# Patient Record
Sex: Male | Born: 1960
Health system: Southern US, Community
[De-identification: ages and names within clinical notes are randomized; demographics above are authoritative.]

## PROBLEM LIST (undated history)

## (undated) DIAGNOSIS — Z8679 Personal history of other diseases of the circulatory system: Secondary | ICD-10-CM

## (undated) DIAGNOSIS — I2510 Atherosclerotic heart disease of native coronary artery without angina pectoris: Secondary | ICD-10-CM

## (undated) DIAGNOSIS — I509 Heart failure, unspecified: Secondary | ICD-10-CM

## (undated) DIAGNOSIS — R000 Tachycardia, unspecified: Secondary | ICD-10-CM

## (undated) HISTORY — DX: Tachycardia, unspecified: R00.0

## (undated) HISTORY — DX: Personal history of other diseases of the circulatory system: Z86.79

## (undated) HISTORY — DX: Heart failure, unspecified: I50.9

## (undated) HISTORY — DX: Atherosclerotic heart disease of native coronary artery without angina pectoris: I25.10

---

## 2004-01-27 ENCOUNTER — Observation Stay (HOSPITAL_COMMUNITY): Admission: EM | Admit: 2004-01-27 | Discharge: 2004-01-29 | Payer: Self-pay | Admitting: Emergency Medicine

## 2004-01-27 HISTORY — PX: INGUINAL HERNIA REPAIR: SHX194

## 2008-09-27 DIAGNOSIS — R000 Tachycardia, unspecified: Secondary | ICD-10-CM

## 2008-09-27 HISTORY — DX: Tachycardia, unspecified: R00.0

## 2009-06-24 ENCOUNTER — Encounter: Payer: Self-pay | Admitting: Internal Medicine

## 2009-06-24 ENCOUNTER — Ambulatory Visit: Payer: Self-pay | Admitting: Cardiology

## 2009-06-24 ENCOUNTER — Ambulatory Visit: Payer: Self-pay | Admitting: Diagnostic Radiology

## 2009-06-24 ENCOUNTER — Encounter: Payer: Self-pay | Admitting: Emergency Medicine

## 2009-06-24 ENCOUNTER — Inpatient Hospital Stay (HOSPITAL_COMMUNITY): Admission: EM | Admit: 2009-06-24 | Discharge: 2009-06-28 | Payer: Self-pay | Admitting: Internal Medicine

## 2009-06-27 HISTORY — PX: CARDIAC CATHETERIZATION: SHX172

## 2009-06-30 ENCOUNTER — Telehealth: Payer: Self-pay | Admitting: Internal Medicine

## 2009-07-01 ENCOUNTER — Telehealth (INDEPENDENT_AMBULATORY_CARE_PROVIDER_SITE_OTHER): Payer: Self-pay | Admitting: *Deleted

## 2009-07-07 DIAGNOSIS — I2510 Atherosclerotic heart disease of native coronary artery without angina pectoris: Secondary | ICD-10-CM | POA: Insufficient documentation

## 2009-07-07 DIAGNOSIS — I10 Essential (primary) hypertension: Secondary | ICD-10-CM | POA: Insufficient documentation

## 2009-07-07 DIAGNOSIS — R000 Tachycardia, unspecified: Secondary | ICD-10-CM | POA: Insufficient documentation

## 2009-07-07 DIAGNOSIS — I509 Heart failure, unspecified: Secondary | ICD-10-CM | POA: Insufficient documentation

## 2009-07-08 ENCOUNTER — Ambulatory Visit: Payer: Self-pay | Admitting: Internal Medicine

## 2009-07-09 ENCOUNTER — Telehealth: Payer: Self-pay | Admitting: Internal Medicine

## 2009-07-14 ENCOUNTER — Telehealth: Payer: Self-pay | Admitting: Internal Medicine

## 2009-07-18 ENCOUNTER — Telehealth: Payer: Self-pay | Admitting: Internal Medicine

## 2009-07-22 ENCOUNTER — Ambulatory Visit: Payer: Self-pay | Admitting: Internal Medicine

## 2009-07-22 DIAGNOSIS — I5022 Chronic systolic (congestive) heart failure: Secondary | ICD-10-CM | POA: Insufficient documentation

## 2009-07-22 DIAGNOSIS — I2510 Atherosclerotic heart disease of native coronary artery without angina pectoris: Secondary | ICD-10-CM | POA: Insufficient documentation

## 2009-07-28 ENCOUNTER — Telehealth: Payer: Self-pay | Admitting: Internal Medicine

## 2009-08-05 ENCOUNTER — Ambulatory Visit: Payer: Self-pay | Admitting: Cardiology

## 2009-08-06 ENCOUNTER — Telehealth: Payer: Self-pay | Admitting: Internal Medicine

## 2009-08-19 ENCOUNTER — Ambulatory Visit: Payer: Self-pay | Admitting: Cardiology

## 2009-08-19 ENCOUNTER — Encounter (INDEPENDENT_AMBULATORY_CARE_PROVIDER_SITE_OTHER): Payer: Self-pay | Admitting: *Deleted

## 2009-08-19 ENCOUNTER — Encounter: Payer: Self-pay | Admitting: Cardiology

## 2009-08-19 ENCOUNTER — Ambulatory Visit (HOSPITAL_COMMUNITY): Admission: RE | Admit: 2009-08-19 | Discharge: 2009-08-19 | Payer: Self-pay | Admitting: Cardiology

## 2009-08-19 ENCOUNTER — Ambulatory Visit: Payer: Self-pay | Admitting: Cardiovascular Disease

## 2009-08-19 ENCOUNTER — Ambulatory Visit: Payer: Self-pay

## 2009-08-26 ENCOUNTER — Telehealth: Payer: Self-pay | Admitting: Internal Medicine

## 2009-09-01 ENCOUNTER — Telehealth: Payer: Self-pay | Admitting: Internal Medicine

## 2009-09-03 ENCOUNTER — Telehealth: Payer: Self-pay | Admitting: Internal Medicine

## 2009-09-30 ENCOUNTER — Encounter (INDEPENDENT_AMBULATORY_CARE_PROVIDER_SITE_OTHER): Payer: Self-pay | Admitting: Nurse Practitioner

## 2009-09-30 ENCOUNTER — Ambulatory Visit: Payer: Self-pay | Admitting: Cardiology

## 2009-09-30 ENCOUNTER — Encounter: Payer: Self-pay | Admitting: Internal Medicine

## 2009-10-01 ENCOUNTER — Telehealth: Payer: Self-pay | Admitting: Internal Medicine

## 2009-10-01 ENCOUNTER — Encounter (INDEPENDENT_AMBULATORY_CARE_PROVIDER_SITE_OTHER): Payer: Self-pay | Admitting: *Deleted

## 2009-10-02 ENCOUNTER — Telehealth: Payer: Self-pay | Admitting: Internal Medicine

## 2009-10-03 ENCOUNTER — Encounter: Payer: Self-pay | Admitting: Internal Medicine

## 2009-10-14 ENCOUNTER — Encounter (INDEPENDENT_AMBULATORY_CARE_PROVIDER_SITE_OTHER): Payer: Self-pay | Admitting: *Deleted

## 2009-10-21 ENCOUNTER — Ambulatory Visit: Payer: Self-pay | Admitting: Cardiology

## 2009-10-21 ENCOUNTER — Ambulatory Visit (HOSPITAL_COMMUNITY): Admission: RE | Admit: 2009-10-21 | Discharge: 2009-10-21 | Payer: Self-pay | Admitting: Internal Medicine

## 2009-10-24 ENCOUNTER — Telehealth: Payer: Self-pay | Admitting: Internal Medicine

## 2009-10-27 ENCOUNTER — Encounter (INDEPENDENT_AMBULATORY_CARE_PROVIDER_SITE_OTHER): Payer: Self-pay | Admitting: *Deleted

## 2009-10-28 ENCOUNTER — Telehealth: Payer: Self-pay | Admitting: Internal Medicine

## 2009-11-18 ENCOUNTER — Ambulatory Visit: Payer: Self-pay | Admitting: Internal Medicine

## 2009-12-01 ENCOUNTER — Encounter: Payer: Self-pay | Admitting: Internal Medicine

## 2009-12-11 ENCOUNTER — Telehealth: Payer: Self-pay | Admitting: Internal Medicine

## 2009-12-11 ENCOUNTER — Encounter: Payer: Self-pay | Admitting: Internal Medicine

## 2009-12-15 ENCOUNTER — Telehealth: Payer: Self-pay | Admitting: Internal Medicine

## 2009-12-24 ENCOUNTER — Ambulatory Visit (HOSPITAL_BASED_OUTPATIENT_CLINIC_OR_DEPARTMENT_OTHER): Admission: RE | Admit: 2009-12-24 | Discharge: 2009-12-24 | Payer: Self-pay | Admitting: Orthopedic Surgery

## 2009-12-24 ENCOUNTER — Ambulatory Visit: Payer: Self-pay | Admitting: Radiology

## 2010-01-01 ENCOUNTER — Encounter: Payer: Self-pay | Admitting: Internal Medicine

## 2010-01-19 ENCOUNTER — Ambulatory Visit: Payer: Self-pay | Admitting: Cardiology

## 2010-01-19 ENCOUNTER — Telehealth: Payer: Self-pay | Admitting: Internal Medicine

## 2010-01-19 ENCOUNTER — Ambulatory Visit: Payer: Self-pay

## 2010-01-19 ENCOUNTER — Encounter: Admission: RE | Admit: 2010-01-19 | Discharge: 2010-01-19 | Payer: Self-pay | Admitting: Neurosurgery

## 2010-01-19 ENCOUNTER — Ambulatory Visit (HOSPITAL_COMMUNITY): Admission: RE | Admit: 2010-01-19 | Discharge: 2010-01-19 | Payer: Self-pay | Admitting: Internal Medicine

## 2010-01-19 ENCOUNTER — Encounter: Payer: Self-pay | Admitting: Internal Medicine

## 2010-02-03 ENCOUNTER — Ambulatory Visit: Payer: Self-pay | Admitting: Internal Medicine

## 2010-02-04 ENCOUNTER — Telehealth: Payer: Self-pay | Admitting: Internal Medicine

## 2010-02-06 ENCOUNTER — Telehealth: Payer: Self-pay | Admitting: Internal Medicine

## 2010-02-12 ENCOUNTER — Telehealth: Payer: Self-pay | Admitting: Internal Medicine

## 2010-03-16 ENCOUNTER — Telehealth: Payer: Self-pay | Admitting: Internal Medicine

## 2010-03-25 ENCOUNTER — Telehealth: Payer: Self-pay | Admitting: Internal Medicine

## 2010-03-27 ENCOUNTER — Telehealth: Payer: Self-pay | Admitting: Internal Medicine

## 2010-04-06 ENCOUNTER — Telehealth: Payer: Self-pay | Admitting: Internal Medicine

## 2010-04-10 ENCOUNTER — Ambulatory Visit: Payer: Self-pay | Admitting: Internal Medicine

## 2010-04-13 ENCOUNTER — Encounter: Payer: Self-pay | Admitting: Internal Medicine

## 2010-04-13 LAB — CONVERTED CEMR LAB
ALT: 23 units/L (ref 0–53)
AST: 26 units/L (ref 0–37)
Albumin: 4.2 g/dL (ref 3.5–5.2)
Alkaline Phosphatase: 32 units/L — ABNORMAL LOW (ref 39–117)
BUN: 17 mg/dL (ref 6–23)
Bilirubin, Direct: 0.1 mg/dL (ref 0.0–0.3)
CO2: 29 meq/L (ref 19–32)
Calcium: 9.1 mg/dL (ref 8.4–10.5)
Chloride: 110 meq/L (ref 96–112)
Cholesterol: 173 mg/dL (ref 0–200)
Creatinine, Ser: 1.1 mg/dL (ref 0.4–1.5)
GFR calc non Af Amer: 90.06 mL/min (ref >60)
Glucose, Bld: 95 mg/dL (ref 70–99)
HDL: 41.6 mg/dL (ref >39.00)
LDL Cholesterol: 112 mg/dL — ABNORMAL HIGH (ref 0–99)
Potassium: 3.9 meq/L (ref 3.5–5.1)
Pro B Natriuretic peptide (BNP): 25.7 pg/mL (ref 0.0–100.0)
Sodium: 141 meq/L (ref 135–145)
Total Bilirubin: 0.6 mg/dL (ref 0.3–1.2)
Total CHOL/HDL Ratio: 4
Total Protein: 6.4 g/dL (ref 6.0–8.3)
Triglycerides: 99 mg/dL (ref 0.0–149.0)
VLDL: 19.8 mg/dL (ref 0.0–40.0)

## 2010-05-18 ENCOUNTER — Encounter: Payer: Self-pay | Admitting: Internal Medicine

## 2010-05-27 ENCOUNTER — Ambulatory Visit: Payer: Self-pay | Admitting: Internal Medicine

## 2010-05-28 ENCOUNTER — Telehealth: Payer: Self-pay | Admitting: Internal Medicine

## 2010-06-04 ENCOUNTER — Encounter: Payer: Self-pay | Admitting: Internal Medicine

## 2010-06-10 ENCOUNTER — Ambulatory Visit: Payer: Self-pay | Admitting: Internal Medicine

## 2010-06-17 LAB — CONVERTED CEMR LAB
BUN: 24 mg/dL — ABNORMAL HIGH (ref 6–23)
CO2: 28 meq/L (ref 19–32)
Calcium: 9.3 mg/dL (ref 8.4–10.5)
Chloride: 107 meq/L (ref 96–112)
Creatinine, Ser: 1.2 mg/dL (ref 0.4–1.5)
GFR calc non Af Amer: 87.29 mL/min (ref >60)
Glucose, Bld: 91 mg/dL (ref 70–99)
Potassium: 3.6 meq/L (ref 3.5–5.1)
Pro B Natriuretic peptide (BNP): 23.2 pg/mL (ref 0.0–100.0)
Sodium: 142 meq/L (ref 135–145)

## 2010-07-05 ENCOUNTER — Encounter: Payer: Self-pay | Admitting: Internal Medicine

## 2010-07-17 ENCOUNTER — Telehealth: Payer: Self-pay | Admitting: Internal Medicine

## 2010-08-05 ENCOUNTER — Encounter: Payer: Self-pay | Admitting: Internal Medicine

## 2010-09-05 ENCOUNTER — Encounter: Payer: Self-pay | Admitting: Internal Medicine

## 2010-10-18 ENCOUNTER — Encounter: Payer: Self-pay | Admitting: Neurosurgery

## 2010-10-25 LAB — CONVERTED CEMR LAB
BUN: 21 mg/dL (ref 6–23)
CO2: 30 meq/L (ref 19–32)
Calcium: 9.8 mg/dL (ref 8.4–10.5)
Chloride: 101 meq/L (ref 96–112)
Creatinine, Ser: 1.3 mg/dL (ref 0.4–1.5)
Digitoxin Lvl: <0.1 ng/mL — ABNORMAL LOW (ref 0.8–2.0)
GFR calc non Af Amer: 76.08 mL/min (ref >60)
Glucose, Bld: 77 mg/dL (ref 70–99)
Potassium: 4.3 meq/L (ref 3.5–5.1)
Pro B Natriuretic peptide (BNP): 485 pg/mL — ABNORMAL HIGH (ref 0.0–100.0)
Sodium: 144 meq/L (ref 135–145)
TSH: 3.25 microintl units/mL (ref 0.35–5.50)

## 2010-10-27 ENCOUNTER — Ambulatory Visit (HOSPITAL_COMMUNITY): Admission: RE | Admit: 2010-10-27 | Payer: Self-pay | Source: Home / Self Care | Admitting: Internal Medicine

## 2010-10-27 ENCOUNTER — Ambulatory Visit: Admit: 2010-10-27 | Payer: Self-pay

## 2010-10-27 NOTE — Progress Notes (Signed)
Summary: pt needs updated work resriction note  Phone Note Call from Patient   Caller: Patient Reason for Call: Talk to Nurse, Talk to Doctor Summary of Call: per pt call please contact Janey Genta at [phone] because he needs a updated letter explaining his restrictions again fax# [phone] Initial call taken by: Omer Jack,  December 11, 2009 1:20 PM  Follow-up for Phone Call        Left message to call back Meredith Staggers, RN  December 11, 2009 2:08 PM   spoke w/Gretchen, pt has been out of work for another reason and is returning to work on 3/18 and she just needs a letter stating what his restrictions are from our standpoint and when they will be readdressed, will fax note Meredith Staggers, RN  December 11, 2009 2:25 PM   Additional Follow-up for Phone Call Additional follow up Details #1::        left message for pt that I had spoken w/Gretchen and note was faxed Meredith Staggers, RN  December 11, 2009 2:33 PM

## 2010-10-27 NOTE — Progress Notes (Signed)
Summary: work--needs to discuss job restrictions  Phone Note Call from Patient Call back at [employer] [phone]   Caller: Patient Reason for Call: Talk to Nurse Summary of Call: request to speak to nurse asap about  job Initial call taken by: Migdalia Dk,  Feb 06, 2010 4:08 PM  Follow-up for Phone Call        talked with patient --patient needs to discuss job and job restrictions--per pt-his employer is having a hard time finding a job for him to do with the current work restrictions--will forward to [name] for review     Appended Document: work--needs to discuss job restrictions we will call him on wednesday to discuss

## 2010-10-27 NOTE — Progress Notes (Signed)
Summary: refill rx lisinopril  Phone Note Refill Request Message from:  Patient on March 16, 2010 12:47 PM  Refills Requested: Medication #1:  LISINOPRIL 20 MG TABS 1 by mouth two times a day would like faxes to [facility]   Method Requested: Fax to Local Pharmacy Initial call taken by: Glynda Jaeger,  March 16, 2010 12:47 PM    Prescriptions: LISINOPRIL 20 MG TABS (LISINOPRIL) 1 by mouth two times a day  #180 x 3   Entered by:   Hardin Negus, RMA   Authorized by:   [name], MD, [facility]   Signed by:   Hardin Negus, RMA on 03/16/2010   Method used:   Faxed to ...       [facility] (mail-order)             , [place]         Ph: [phone]       Fax: [phone]   RxID:   [id number]

## 2010-10-27 NOTE — Letter (Signed)
Summary: Return To Work  [facility], Main Office  [address]   [place], [place] [address]   Phone: [phone]  Fax: [phone]    11/18/2009  TO: WHOM IT MAY CONCERN   RE: Zachary Arias [address] [id number]   The above named individual is under my medical care and is to remain out of work until Monday 11/24/09.  If you have any further questions or need additional information, please call.     Sincerely,    Arvilla Meres, MD  Appended Document: Return To Work letter faxed to Christiana Pellant at [phone]

## 2010-10-27 NOTE — Progress Notes (Signed)
Summary: pt needs a 7day supply called into local pharm  Phone Note Refill Request Call back at Home Phone [phone] Message from:  Patient on [facility]/ [phone]  Refills Requested: Medication #1:  LISINOPRIL 20 MG TABS 1 by mouth two times a day pt still has not recieved his meds from [facility] so they have approved him for a 7day supply   Initial call taken by: Omer Jack,  March 25, 2010 1:25 PM    Prescriptions: LISINOPRIL 20 MG TABS (LISINOPRIL) 1 by mouth two times a day  #14 x 0   Entered by:   Burnett Kanaris, CNA   Authorized by:   [name], MD, [facility]   Signed by:   Burnett Kanaris, CNA on 03/25/2010   Method used:   Electronically to        [facility]* (retail)       [address]       [place], [place]  [address]       Ph: [phone]       Fax: [phone]   RxID:   [id number]

## 2010-10-27 NOTE — Progress Notes (Signed)
Summary: HAVE QUESTIONS REGARDING LETTER TO RTN TO WORK / medication  Phone Note Call from Patient Call back at [phone]   Caller: GRETCHEN GREEN FROM HIS WORK Reason for Call: Talk to Nurse, Talk to Doctor Summary of Call: THEY GOT THE LETTER BUT THEY HAVE SOME QUESTIONS ABOUT IT Initial call taken by: Omer Jack,  October 28, 2009 1:17 PM  Follow-up for Phone Call        Left message to call back Meredith Staggers, RN  October 28, 2009 1:42 PM   Per pt calling back to speak with dr. Gala Romney nurse Herbert Seta, has a couple of question , pt is aware heather is in clinic today. pt stated that he is release to go back to work on [employer]  and working around Systems analyst.  also pt stated that his medicaton makes him sleepy. [phone] Lorne Skeens  October 31, 2009 12:34 PM   Additional Follow-up for Phone Call Additional follow up Details #1::        spoke w/pt regarding meds Meredith Staggers, RN  October 31, 2009 4:59 PM

## 2010-10-27 NOTE — Cardiovascular Report (Signed)
Summary: Health Management Program Summary Report   Health Management Program Summary Report   Imported By: Roderic Ovens 09/04/2010 13:06:34  _____________________________________________________________________  External Attachment:    Type:   Image     Comment:   External Document

## 2010-10-27 NOTE — Progress Notes (Signed)
Summary: needs update on his condition faxed to his employer  Phone Note Call from Patient   Reason for Call: Talk to Nurse Summary of Call: pt needs a fax to his employer gretchen green fax [phone] with an update on his condiition as well as stating we changed his appt from 7-26 to 8-31 Initial call taken by: Glynda Jaeger,  April 06, 2010 1:29 PM  Follow-up for Phone Call        talked with pt--pt states employer asking for any changes in his job restrictions since last information provided to employer 02/03/10 and pt requesting that we notify employer  that pt's appointment with [name] has  been changed from July 26,2011 to August 31,2011--I will append letter of 02/03/10 stating that pt's appointment with [name] was changed from July 26 to August 31 and that there have not been any changes in his work restrictions since May 10,2011-I will fax this to Christiana Pellant [phone] at pt's request

## 2010-10-27 NOTE — Cardiovascular Report (Signed)
Summary: Heart Failure Program Summary Report   Heart Failure Program Summary Report   Imported By: Roderic Ovens 05/18/2010 10:05:16  _____________________________________________________________________  External Attachment:    Type:   Image     Comment:   External Document

## 2010-10-27 NOTE — Assessment & Plan Note (Signed)
Summary: per check out/sf   Referring Provider:  Bensimhon Primary Provider:  (none)  CC:  SOB  still mainly when he leaves the house -- pain in his right leg.  History of Present Illness: 50 y/o male with HF due to mixed ischemic/no-ischemic CM with EF 25-30% range. Cath with 1-v CAD with chronically occluded RCA. MRI in January with EF 31% with inferior scar.  Returns today for routine f/u. From cardiac point of view doing OK. Does get SOB with moderate exertion. Occasional fletting CP. No orthopnea or PND. Occasional brief palpitations. No dizziness or syncope.   Main complaint is sciatic pain down R leg which limits him. Went to [facility] last week and given muscle relaxer, steroids, and tramadol.    Current Medications (verified): 1)  Coreg 12.5 Mg Tabs (Carvedilol) .Marland Kitchen.. 1 and 1/2  By Mouth Two Times A Day 2)  Digoxin 0.25 Mg Tabs (Digoxin) .... Take One Tablet By Mouth Daily 3)  Furosemide 20 Mg Tabs (Furosemide) .Marland Kitchen.. 1 By Mouth Daily 4)  Lisinopril 20 Mg Tabs (Lisinopril) .Marland Kitchen.. 1 By Mouth Two Times A Day 5)  K-Lor 20 Meq Pack (Potassium Chloride) .... Daily 6)  Multivitamins   Tabs (Multiple Vitamin) .... Every Other Day 7)  Vitamin E 600 Unit  Caps (Vitamin E) .... Every Other Day 8)  Prednisone 10 Mg Tabs (Prednisone) .... Taper Dose 9)  Tramadol Hcl 50 Mg Tabs (Tramadol Hcl) .... At Bedtime As Needed 10)  Cyclobenzaprine Hcl 10 Mg Tabs (Cyclobenzaprine Hcl) .... At Bedtime As Needed  Allergies (verified): No Known Drug Allergies  Past History:  Past Medical History:  1. Congestive heart failure secondary to mixed ischemic/nonischemia cardiomyopathy with       an ejection fraction of 25-30%.       --Cardiac MRI 1/11: EF 31% Inferior scar      --cath 2010: 1-v CAD with occluded distal RCA with collats. left system ok 2. Hypertension  Review of Systems       As per HPI and past medical history; otherwise all systems negative.   Vital Signs:  Patient  profile:   50 year old male Height:      68 inches Weight:      184 pounds BMI:     28.08 Pulse rate:   70 / minute BP sitting:   140 / 96  (left arm) Cuff size:   regular  Vitals Entered By: Hardin Negus, RMA (November 18, 2009 4:25 PM)  Physical Exam  General:  Gen: well appearing. no resp difficulty HEENT: normal Neck: supple. no JVD. Carotids 2+ bilat; no bruits. No lymphadenopathy or thryomegaly appreciated. Cor: PMI nondisplaced. Regular rate & rhythm. No rubs,  murmur. Lungs: clear Abdomen: soft, nontender, nondistended. No hepatosplenomegaly. No bruits or masses. Good bowel sounds. Extremities: no cyanosis, clubbing, rash, edema Neuro: alert & orientedx3, cranial nerves grossly intact. moves all 4 extremities w/o difficulty. affect pleasant    Impression & Recommendations:  Problem # 1:  SYSTOLIC HEART FAILURE, CHRONIC (ICD-428.22)  Doing well. Volume status looks good. NYHA Class II. Will increase carvedilol to 25 two times a day. Recheck echo in 2-3 months. If EF remains depressed will need to consider ICD. Add spiro at next visit.   Orders: Echocardiogram (Echo)  Problem # 2:  CAD, NATIVE VESSEL (ICD-414.01) Stable. No evidence of ischemia. Continue current regimen.  Problem # 3:  Sciatica Continue current regimen. No evidence of signifcant weakness. Is establishing PCP care on Friday.  Will give him note to stay out of work until then.    Patient Instructions: 1)  Increase Carvedilol to 25mg  two times a day  2)  Your physician recommended you to remain out of work until [facility] 2/28.  An out of work note was provided today. 3)  Your physician has requested that you have an echocardiogram.  Echocardiography is a painless test that uses sound waves to create images of your heart. It provides your doctor with information about the size and shape of your heart and how well your heart's chambers and valves are working.  This procedure takes approximately one hour. There  are no restrictions for this procedure.  Needs test in 2-3 months. 4)  Follow up in 1 month Prescriptions: CARVEDILOL 25 MG TABS (CARVEDILOL) Take one tablet by mouth twice a day  #180 x 3   Entered by:   Meredith Staggers, RN   Authorized by:   [name], MD, [facility]   Signed by:   Meredith Staggers, RN on 11/18/2009   Method used:   Electronically to        [facility] (mail-order)             ,          Ph: [phone]       Fax: [phone]   RxID:   [id number]

## 2010-10-27 NOTE — Assessment & Plan Note (Signed)
Summary: per check out/sf   Visit Type:  Follow-up Referring Provider:  Bensimhon Primary Provider:  Dr Luz Brazen  CC:  rov/ pt wants to discuss if his work restrictions can now be lifted and inquire about getting a pneumonia shot and flu shot.  History of Present Illness: 50 y/o male with HF due to mixed ischemic/no-ischemic CM (diagnosed 9/10) with EF 25-30% range. Cath with 1-v CAD with chronically occluded RCA. MRI in January 2011 with EF 31% with inferior scar.  Echo April 2011 showed EF 30-35%.  We discussed possibility of ICD in May and due to insurance reasons he wanted to wait until 2012.   Still working full time. Feels well. Works as a Administrator, [employer]. About 50% of items less than 35 pounds. Other 50% is 50 pounds or more. He has been following work restrictions but feels he might be able to do more. No CP or SOB. No CP/SOB, edema, or palpitations. Feels like he can do anything he wants to without limitation.   Current Medications (verified): 1)  Carvedilol 25 Mg Tabs (Carvedilol) .... Take One Tablet By Mouth Twice A Day 2)  Digoxin 0.25 Mg Tabs (Digoxin) .... Take One Tablet By Mouth Daily 3)  Furosemide 20 Mg Tabs (Furosemide) .Marland Kitchen.. 1 By Mouth Daily 4)  Lisinopril 20 Mg Tabs (Lisinopril) .Marland Kitchen.. 1 By Mouth Two Times A Day 5)  K-Lor 20 Meq Pack (Potassium Chloride) .... Daily 6)  Multivitamins   Tabs (Multiple Vitamin) .... Every Other Day 7)  Vitamin E 600 Unit  Caps (Vitamin E) .... Every Other Day 8)  Simvastatin 20 Mg Tabs (Simvastatin) .... Take One Tablet By Mouth Daily At Bedtime  Allergies (verified): 1)  ! * Shellfish   Past History:  Past Medical History: Last updated: 11/18/2009  1. Congestive heart failure secondary to mixed ischemic/nonischemia cardiomyopathy with       an ejection fraction of 25-30%.       --Cardiac MRI 1/11: EF 31% Inferior scar      --cath 2010: 1-v CAD with occluded distal RCA with collats. left system ok 2. Hypertension  Social  History:  He is single.  He works as a Location manager.  He is   previously from the [place].  He does not smoke or use any   alcohol.  No reported drug use.   Review of Systems       As per HPI and past medical history; otherwise all systems negative.   Vital Signs:  Patient profile:   50 year old male Height:      68 inches Weight:      173 pounds BMI:     26.40 Pulse rate:   61 / minute Pulse rhythm:   regular BP sitting:   128 / 80  (left arm) Cuff size:   regular  Vitals Entered By: Judithe Modest CMA (May 27, 2010 12:00 PM)  Physical Exam  General:  Gen: well appearing. no resp difficulty HEENT: normal Neck: supple. no JVD. Carotids 2+ bilat; no bruits. No lymphadenopathy or thryomegaly appreciated. Cor: PMI nondisplaced. Regular rate & rhythm. No rubs,  murmur. Lungs: clear Abdomen: soft, nontender, nondistended. No hepatosplenomegaly. No bruits or masses. Good bowel sounds. Extremities: no cyanosis, clubbing, rash, edema Neuro: alert & orientedx3, cranial nerves grossly intact. moves all 4 extremities w/o difficulty. affect pleasant    Impression & Recommendations:  Problem # 1:  SYSTOLIC HEART FAILURE, CHRONIC (ICD-428.22) Doing well. NYHA class I. Volume status looks good. On  good meds. Will add spiro 12.5. Check BMET and BNP in 1 week. 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 Discussed ICD but given NYHA class I without known h/o NSVT doesn't qualify at this point.  F/u 4 months with echo.   Problem # 2:  CAD, NATIVE VESSEL (ICD-414.01) Stable. No evidence of ischemia. Continue current regimen.  Other Orders: Echocardiogram (Echo)  Patient Instructions: 1)  Start Spironolactone 25mg  1/2 tab daily 2)  Your physician recommends that you return for lab work in: 10 days (bmet, bnp 428.22) 3)  Your physician has requested that you have an echocardiogram.  Echocardiography is a painless test that uses sound waves to create images of your  heart. It provides your doctor with information about the size and shape of your heart and how well your heart's chambers and valves are working.  This procedure takes approximately one hour. There are no restrictions for this procedure.  NEEDS IN 4 MONTHS. 4)  Follow up in 4 months. Prescriptions: SPIRONOLACTONE 25 MG TABS (SPIRONOLACTONE) Take 1/2 tablet by mouth daily  #90 x 3   Entered by:   Meredith Staggers, RN   Authorized by:   [name], MD, [facility]   Signed by:   Meredith Staggers, RN on 05/27/2010   Method used:   Faxed to ...       [facility] (mail-order)             , [place]         Ph: [phone]       Fax: [phone]   RxID:   [id number]

## 2010-10-27 NOTE — Progress Notes (Signed)
Summary: has information been faxed to employer  Phone Note Call from Patient Call back at Home Phone [phone]   Caller: Patient Reason for Call: Talk to Nurse Summary of Call: Patient would like to know if updated information was faxed to his employer (regarding return to work date). Initial call taken by: Burnard Leigh,  October 01, 2009 12:10 PM  Follow-up for Phone Call        faxed ov note from 1/4 to Dorna Mai at [place], fax # [phone], pt aware Meredith Staggers, RN  October 01, 2009 12:19 PM

## 2010-10-27 NOTE — Letter (Signed)
Summary: Custom - Lipid  [facility], Main Office  [address]   [place], [place] [address]   Phone: [phone]  Fax: [phone]     April 13, 2010 MRN: [id number]   Zachary Arias [address] [place], [place]  [address]   Dear Mr. Makris,  We have reviewed your cholesterol results.  They are as follows:     Total Cholesterol:    173 (Desirable: less than 200)       HDL  Cholesterol:     41.60  (Desirable: greater than 40 for men and 50 for women)       LDL Cholesterol:       112  (Desirable: less than 100 for low risk and less than 70 for moderate to high risk)       Triglycerides:       99.0  (Desirable: less than 150)  Our recommendations include:  Looks ok, continue current medications, your other labwork including liver and kidney functions were also normal.  Please give [place] a call if you have any questions.   Call our office at the number listed above if you have any questions.  Lowering your LDL cholesterol is important, but it is only one of a large number of "risk factors" that may indicate that you are at risk for heart disease, stroke or other complications of hardening of the arteries.  Other risk factors include:   A.  Cigarette Smoking* B.  High Blood Pressure* C.  Obesity* D.   Low HDL Cholesterol (see yours above)* E.   Diabetes Mellitus (higher risk if your is uncontrolled) F.  Family history of premature heart disease G.  Previous history of stroke or cardiovascular disease    *These are risk factors YOU HAVE CONTROL OVER.  For more information, visit .  There is now evidence that lowering the TOTAL CHOLESTEROL AND LDL CHOLESTEROL can reduce the risk of heart disease.  The American Heart Association recommends the following guidelines for the treatment of elevated cholesterol:  1.  If there is now current heart disease and less than two risk factors, TOTAL CHOLESTEROL should be less than 200 and LDL CHOLESTEROL should be less than  100. 2.  If there is current heart disease or two or more risk factors, TOTAL CHOLESTEROL should be less than 200 and LDL CHOLESTEROL should be less than 70.  A diet low in cholesterol, saturated fat, and calories is the cornerstone of treatment for elevated cholesterol.  Cessation of smoking and exercise are also important in the management of elevated cholesterol and preventing vascular disease.  Studies have shown that 30 to 60 minutes of physical activity most days can help lower blood pressure, lower cholesterol, and keep your weight at a healthy level.  Drug therapy is used when cholesterol levels do not respond to therapeutic lifestyle changes (smoking cessation, diet, and exercise) and remains unacceptably high.  If medication is started, it is important to have you levels checked periodically to evaluate the need for further treatment options.  Thank you,    [facility] Team

## 2010-10-27 NOTE — Cardiovascular Report (Signed)
Summary: Health Management Program Summary Report   Health Management Program Summary Report   Imported By: Roderic Ovens 09/04/2010 13:06:55  _____________________________________________________________________  External Attachment:    Type:   Image     Comment:   External Document

## 2010-10-27 NOTE — Letter (Signed)
Summary: Return To Work  [facility], Main Office  [address]   [place], [place] [address]   Phone: [phone]  Fax: [phone]    12/11/2009  TO: Tessie Fass Attn:  Christiana Pellant Fax:  [phone]   RE: Zachary Arias [address] [id number]   The above named individual is under my medical care and was released to return to work on Monday 11/03/09 with restrictions.  His restrictions include he be allowed rest breaks as needed and he is restricted to lifting 35 pounds maximum.  These restrictions are in place until further notice.  We will see the patient again for follow-up on 12/25/09.  If you have any further questions or need additional information, please call.     Sincerely,    Meredith Staggers, RN Arvilla Meres, MD

## 2010-10-27 NOTE — Letter (Signed)
Summary: Return To Work  [facility], Main Office  [address]   [place], [place] [address]   Phone: [phone]  Fax: [phone]    05/27/2010  TO: WHOM IT MAY CONCERN   RE: Zachary Arias [address] [id number]   The above named individual is under my medical care and is clear to work with a few restrictions.  He may lift up to 35lbs with no restriction and he may lift objects 50lbs or more on occasion with rest breaks as needed.  If you have any further questions or need additional information, please call.     Sincerely,    Arvilla Meres, MD  Appended Document: Return To Work We will re-evaluate work restrictions in 3 months when we see him again. Can lift 50 pounds as much as he needs to and can tolerate.

## 2010-10-27 NOTE — Progress Notes (Signed)
Summary:  need another return to work letter  Phone Note Call from Patient   Caller: Patient Reason for Call: Talk to Nurse Summary of Call: pt needs another letter for work re how long restrictions will be in place, also a date it will end, and how often he can lift the occasional 50lbs? att: gretchen green fax [phone] Initial call taken by: Glynda Jaeger,  May 28, 2010 12:51 PM  Follow-up for Phone Call        will forward to [name] to address ?'s Meredith Staggers, RN  May 28, 2010 1:53 PM   per pt calling back to check on status of return to work [phone] Lorne Skeens  June 02, 2010 12:48 PM   per pt calling  back [phone] Lorne Skeens  June 03, 2010 1:06 PM   Additional Follow-up for Phone Call Additional follow up Details #1::        new note faxed to pt work, left mess for pt this was done Meredith Staggers, RN  June 04, 2010 8:38 AM

## 2010-10-27 NOTE — Letter (Signed)
Summary: Appointment - Cardiac MRI  [facility]     [place], [place]    Phone:   Fax:       October 14, 2009 MRN: [id number]   GEARL BARATTA [address] [place], [place]  [address]   Dear Mr. Wurm,   We have scheduled the above patient for an appointment for a Cardiac MRI on 10-21-2009  at 4 P.M..  Please refer to the below information for the location and instructions for this test:  Location:     [facility]       [address]       [place], [place]  [address] Instructions:    Wilmon Arms at [facility] Outpatient Registration 45 minutes prior to your appointment time.  This will ensure you are in the Radiology Department 30 minutes prior to your appointment.    There are no restrictions for this test you may eat and take medications as usual.  If you need to reschedule this appointment please call at the number listed above.     Sincerely,     Lorne Skeens  [facility] Scheduling Team

## 2010-10-27 NOTE — Assessment & Plan Note (Signed)
Summary: Zachary Arias CHF Primary Cardiologist:  Arvilla Meres, MD NYHA Class:  II     CHF Etiology:  Nonischemic Patient Status:  Active CHF Visit Date 09/30/2009 Date of last Echo 06/23/2009 EF on last Echo 30 Date of Last CHF Visit  08/19/2009   Alcohol Use no Tobacco Use no Drug Use no Digoxin:  <0.1 ng/mL BNP  485.0 Weight:  181 TSH:  3.25    Visit Type:  Follow-up Referring Provider:  Bensimhon Primary Provider:  (none)   History of Present Illness: 50 y/o male with recently diagnosed HF due to NICM with EF 25-30% range Thought secondary to HTN. I saw Zachary Arias in HF clinic last month and he remained dyspneic and tachycardic with HR 122 (sinus tach). Lisinopril and digoxin increased and scheduled to  f/u today.   Feels like his breathing is better. No swelling. No orthopnea or PND. No syncope or presyncope. Denies SOB with activity.Occasional palpitations. Anxious about job and financial conerns causes palpitaions at times.If he walks up steps notices HR goes up quickly. No problems with medications.  Last visit, I increased Lisinopril to 20mg  by mouth two times a day and scheduled repeat echocardiogram.results show EF still significantly reduced at 25%. Zachary Arias reports he is doing well without significant dyspnea, palpitations.  .In reviewing family history, Zachary Arias's younger brother (age 70) already has ICD, and is s/p valve surgery  ? mitral (details unavialble). Father also has HF 2/2 to previous MI, per Zachary Arias.  Preventive Screening-Counseling & Management  Alcohol-Tobacco     Smoking Status: no      Drug Use:  no.    Problems Prior to Update: 1)  Cad, Native Vessel  (ICD-414.01) 2)  Systolic Heart Failure, Chronic  (ICD-428.22) 3)  Hypertension  (ICD-401.9) 4)  Tachycardia  (ICD-785.0) 5)  Cad  (ICD-414.00) 6)  CHF  (ICD-428.0)  Current Medications (verified): 1)  Coreg 12.5 Mg Tabs (Carvedilol) .Marland Kitchen.. 1 By Mouth Two Times A Day 2)  Digoxin 0.25 Mg Tabs  (Digoxin) .... Take One Tablet By Mouth Daily 3)  Furosemide 20 Mg Tabs (Furosemide) .Marland Kitchen.. 1 By Mouth Daily 4)  Lisinopril 20 Mg Tabs (Lisinopril) .Marland Kitchen.. 1 By Mouth Two Times A Day 5)  K-Lor 20 Meq Pack (Potassium Chloride) .... Daily 6)  Multivitamins   Tabs (Multiple Vitamin) .... Out 7)  Vitamin E 600 Unit  Caps (Vitamin E) .... Out  Allergies (verified): No Known Drug Allergies  Past History:  Past Medical History: Last updated: 07/07/2009  1. Congestive heart failure secondary to ischemic cardiomyopathy with       an ejection fraction of 25-30%.   2. Coronary artery disease status post cardiac catheterization this       admission.  The patient is found to have one-vessel coronary artery       disease with totally occluded distal right coronary artery which       appears chronic with right-to-right collaterals.  Mildly increased       filling pressures with preserved cardiac output.  Moderate left       ventricular dysfunction.   3. Unexplained tachycardia during hospitalization.  The patient       pending cardiac MRI, possibly outpatient per Dr. Gala Romney.   4. History of hypertension.   5. Allergy to SHELLFISH.   Family History: Reviewed history from 07/22/2009 and no changes required. Mother died due to ovarian cancer.  Father is alive.   He has history of coronary artery disease and is  status post myocardial   infarction.  He has a brother who has cardiomyopathy s/p AICD and is s/p valve surgery. The details are unclear.     Family History of Cardiomyopathy:  Family History of Coronary Artery Disease:  Family History of Hypertension:   Social History: Reviewed history from 07/07/2009 and no changes required.  He is single.  He works as a Location manager.  He is   previously from the [place].  He does not smoke or use any   alcohol.  No reported drug use.   currently on disability pending cardiac status.Smoking Status:  no Drug Use:  no  Review of Systems        All other system negative except as mentioned in history and problem list   Physical Exam  General:  Gen: well appearing. no resp difficulty HEENT: normal Neck: supple. no JVD. Carotids 2+ bilat; no bruits. No lymphadenopathy or thryomegaly appreciated. Cor: PMI nondisplaced. Regular rate & rhythm. No rubs,  murmur. Lungs: clear Abdomen: soft, nontender, nondistended. No hepatosplenomegaly. No bruits or masses. Good bowel sounds. Extremities: no cyanosis, clubbing, rash, edema Neuro: alert & orientedx3, cranial nerves grossly intact. moves all 4 extremities w/o difficulty. affect pleasant    Vital Signs:  Patient profile:   50 year old male Height:      68 inches Weight:      181 pounds BMI:     27.62 Pulse rate:   67 / minute Resp:     16 per minute BP sitting:   120 / 80  (right arm)  Vitals Entered By: Marrion Coy, CNA (September 30, 2009 4:30 PM)  EKG  Procedure date:  09/30/2009  Findings:      Normal sinus rhythm with rate of: 66. Left ventricular hypertrophy.    Impression & Recommendations:  Problem # 1:  SYSTOLIC HEART FAILURE, CHRONIC (ICD-428.22) Fluid volume status stable. Pt is anxious to return to work, significant financial strain. Will schedule him for cardiac MRI now that heart rate is better controlled. His updated medication list for this problem includes:    Coreg 12.5 Mg Tabs (Carvedilol) .Marland Kitchen... 1 and 1/2  by mouth two times a day      Problem # 2:  TACHYCARDIA (ICD-785.0) Much improved on medications. Ambulated pt in hall HR baseline 70, increased to 90 then promptly returned to baseline.  Problem # 3:  HYPERTENSION (ICD-401.9)  Increase coreg today to 18.375mg  by mouth two times a day.  Other Orders: Cardiac MRI (Cardiac MRI)  Patient Instructions: 1)  Your physician recommends that you schedule a follow-up appointment in: 4-6 weeks with Bensimhon 2)  Your physician has recommended you make the following change in your medication:  Increase Coreg 12.5mg  1 and 1/2 by mouth two times a day  3)  Your physician recommends that you return for lab work in: Do Not Eat Anything 12 hours before your lab test.  4)  Lipid, UJW119.14 -- v58.69 , BMET 428.23 , Digoxin level 428.23 5)  Your physician has requested that you have a cardiac MRI.  Cardiac MRI uses a computer to create images of your heart as it's beating, producing both still and moving pictures of your heart and major blood vessels. For further information please visit  [email or web address]  Please follow the instruction sheet given to you today for more information. Prescriptions: COREG 12.5 MG TABS (CARVEDILOL) 1 and 1/2  by mouth two times a day  #270 x 1   Entered by:  Marrion Coy, CNA   Authorized by:   Dorian Pod, NP-C   Signed by:   Marrion Coy, CNA on 09/30/2009   Method used:   Electronically to        [facility]* (mail-order)             ,          Ph: [phone]       Fax: [phone]   RxID:   [id number]

## 2010-10-27 NOTE — Cardiovascular Report (Signed)
Summary: Heart Failure Program Summary Report  Heart Failure Program Summary Report   Imported By: Roderic Ovens 02/12/2010 12:00:59  _____________________________________________________________________  External Attachment:    Type:   Image     Comment:   External Document

## 2010-10-27 NOTE — Cardiovascular Report (Signed)
Summary: Summary Report  Summary Report   Imported By: Kassie Mends 11/17/2009 13:08:52  _____________________________________________________________________  External Attachment:    Type:   Image     Comment:   External Document

## 2010-10-27 NOTE — Letter (Signed)
Summary: Return To Work  [facility], Main Office  [address]   [place], [place] [address]   Phone: [phone]  Fax: [phone]    10/27/2009  TO: Tessie Fass Attn: Christiana Pellant Fax: [phone]   RE: Zachary Arias [id number] [id number]   The above named individual is under my medical care and may return to work on: Monday 11/03/09. He must be allowed rest breaks as needed and he is restricted to lifting 35 pounds maximum.  If you have any further questions or need additional information, please call.     Sincerely,   Dr. Arvilla Meres Sherri Rad, RN, BSN

## 2010-10-27 NOTE — Letter (Signed)
Summary: Return To Work  [facility], Main Office  [address]   [place], [place] [address]   Phone: [phone]  Fax: [phone]    02/03/2010  TO: WHOM IT MAY CONCERN   RE: Zachary Arias [address] [id number]   The above named individual is under my medical care and may return to work on: Wednesday 02/04/10 with a few restrictions.  No lifting greater than 35lbs and rest breaks as needed.  If you have any further questions or need additional information, please call.     Sincerely,    Arvilla Meres, MD  Appended Document: Return To Work Patient has a F/U appointment in this office on July 26th 2011 at 11:30 AM.  Appended Document: Return To Work Zachary Arias'  appointment with Dr. Gala Romney has been changed from July 26,2011 to August 31,2011. There have not been any changes in his work restrictions since May 10,2011.  Appended Document: Return To Work faxed  to Christiana Pellant [phone], pt's employer, at pt's request

## 2010-10-27 NOTE — Progress Notes (Signed)
Summary: digoxin / spiro / furosemide  Phone Note Refill Request Message from:  Patient on July 17, 2010 12:05 PM  Refills Requested: Medication #1:  SPIRONOLACTONE 25 MG TABS Take 1 tablet by mouth daily.  Medication #2:  DIGOXIN 0.25 MG TABS Take one tablet by mouth daily  Medication #3:  FUROSEMIDE 20 MG TABS 1 by mouth daily pt needs  rx refills. pt change pharmacy to [facility] # [phone].  pt states he needs new rx for siponolactone bc rx was change from half a tablet to whole tablet once a day.   Method Requested: Telephone to Pharmacy Initial call taken by: Roe Coombs,  July 17, 2010 12:05 PM Caller: Patient Reason for Call: Talk to Nurse Summary of Call: pt has question on meds.    Prescriptions: SPIRONOLACTONE 25 MG TABS (SPIRONOLACTONE) Take 1 tablet by mouth daily  #90 x 3   Entered by:   Hardin Negus, RMA   Authorized by:   [name], MD, [facility]   Signed by:   Hardin Negus, RMA on 07/17/2010   Method used:   Faxed to ...       [facility] (mail-order)             , [place]         Ph: [phone]       Fax: [phone]   RxID:   [id number] FUROSEMIDE 20 MG TABS (FUROSEMIDE) 1 by mouth daily  #90 x 3   Entered by:   Hardin Negus, RMA   Authorized by:   [name], MD, [facility]   Signed by:   Hardin Negus, RMA on 07/17/2010   Method used:   Faxed to ...       [facility] (mail-order)             , [place]         Ph: [phone]       Fax: [phone]   RxID:   [id number] DIGOXIN 0.25 MG TABS (DIGOXIN) Take one tablet by mouth daily  #90 x 3   Entered by:   Hardin Negus, RMA   Authorized by:   [name], MD, [facility]   Signed by:   Hardin Negus, RMA on 07/17/2010   Method used:   Faxed to ...       [facility] (mail-order)             , [place]         Ph: [phone]       Fax: [phone]   RxID:   [id number]

## 2010-10-27 NOTE — Letter (Signed)
Summary: Return To Work  [facility], Main Office  [address]   [place], [place] [address]   Phone: [phone]  Fax: [phone]    10/03/2009  TO: WHOM IT MAY CONCERN   RE: Zachary Arias [address] [id number]   The above named individual is under my medical care and has not been released to return to work.  We last saw the patient on Tuesday September 30, 2009.  At that appointment we increased his Carvedilol and scheduled him to have a Cardiac MRI.  We will see the patient for follow-up again on Friday October 24, 2009, we will readdress his work status at that time.   If you have any further questions or need additional information, please call.     Sincerely,     Arvilla Meres, MD

## 2010-10-27 NOTE — Letter (Signed)
Summary: Appointment - Cardiac MRI  [facility], Main Office  [address]   [place], [place] [address]   Phone: [phone]  Fax: [phone]      October 01, 2009 MRN: [id number]   ARIYAN BRISENDINE [address] [place], [place]  [address]   Dear Mr. Monrreal,   We have scheduled the above patient for an appointment for a Cardiac MRI on 10/21/09 at 4:00pm.  Please refer to the below information for the location and instructions for this test:  Location:     [facility]       [address]       [place], [place]  [address] Instructions:    Arrive at [facility] Outpatient Registration 45 minutes prior to your appointment time.  This will ensure you are in the Radiology Department 30 minutes prior to your appointment.    There are no restrictions for this test you may eat and take medications as usual.  If you need to reschedule this appointment please call at the number listed above.  Sincerely,  Glass blower/designer

## 2010-10-27 NOTE — Progress Notes (Signed)
Summary: REFILL  Phone Note Refill Request Message from:  Patient on January 19, 2010 10:46 AM  Refills Requested: Medication #1:  LISINOPRIL 20 MG TABS 1 by mouth two times a day SEND TO [facility]  NOT [facility]  Initial call taken by: Judie Grieve,  January 19, 2010 10:47 AM  Follow-up for Phone Call        left message, told him that when I did the script in March that I sent it to [facility] then, that it is probably on file there, if he has further ?s he is to call back. Follow-up by: Hardin Negus, RMA,  January 19, 2010 2:02 PM

## 2010-10-27 NOTE — Progress Notes (Signed)
Summary: pt needs note faxed to insurance company   Phone Note Call from Patient Call back at [facility] Phone [phone]   Caller: Patient Reason for Call: Talk to Nurse, Talk to Doctor Summary of Call: send information to [employer]  attn:Tedra Cross fax# [phone] he needs it to say an expected date to rtn to work.Gretchen Portela needs this done today cause last note todays date was put in so they need a update asap Initial call taken by: Omer Jack,  October 24, 2009 12:46 PM  Follow-up for Phone Call        can return to work w/rest breaks as needed per [name] as long as he is feeling better, his heart rate has slowed down, EF is still low but per DB pt can return to work as long as he is feeling better, Left message to call back Meredith Staggers, RN  October 24, 2009 4:56 PM   pt still needs a note faxed to his work stating he can rtn and document his restrictions if any he needs this done asap his work is waiting on the note. Omer Jack  October 27, 2009 8:50 AM   Additional Follow-up for Phone Call Additional follow up Details #1::        ( late entry) I have spoken with the pt this morning. I explained I have paged Dr. Gala Romney to clarify if he has any restrictions when he returns to work. The pt asks that I also fax a copy of this note to [employer] attn: Christiana Pellant [phone]. I discussed with the pt what the max is that he lifts at work and he states it is about 70lbs. I have paged Dr. Gala Romney a second time waiting for his response. Sherri Rad, RN, BSN  October 27, 2009 1:48 PM     Additional Follow-up for Phone Call Additional follow up Details #2::    pt calling again, insurance company and work are waiting.... needs to know something,  [phone], Migdalia Dk  October 27, 2009 3:03 PM   I have just been able to speak with Dr. Gala Romney. I advised him that the pt lifts a max of 70lbs at work. Per Dr. Gala Romney- the pt can only lift a max of 35lbs. I will  send a note to [employer] and his work. Sherri Rad, RN, BSN  October 27, 2009 5:45 PM  Notes faxed and the pt is aware that this has been done and of what his restrictions are. Follow-up by: Sherri Rad, RN, BSN,  October 27, 2009 6:01 PM

## 2010-10-27 NOTE — Assessment & Plan Note (Signed)
Summary: per check out/sf   Visit Type:  Follow-up Referring Provider:  Bensimhon Primary Provider:  Dr Luz Brazen  CC:  no complaints.  History of Present Illness: 50 y/o male with HF due to mixed ischemic/no-ischemic CM (diagnosed 9/10) with EF 25-30% range. Cath with 1-v CAD with chronically occluded RCA. MRI in January 2011 with EF 31% with inferior scar.  Echo last month showed EF 30-35%. Brought in today to discuss possibility of ICD.  Recently struggling with herniated disk and R leg sciatica. Had back injection and now feels better. Wanting to go back to work. Denies CP. Mild exertional dyspnea on vigorous exertion. No edema, orthopnea or PND. Very occasional brief palpitations. No dizziness or syncope. No problems with medications.   Current Medications (verified): 1)  Carvedilol 25 Mg Tabs (Carvedilol) .... Take One Tablet By Mouth Twice A Day 2)  Digoxin 0.25 Mg Tabs (Digoxin) .... Take One Tablet By Mouth Daily 3)  Furosemide 20 Mg Tabs (Furosemide) .Marland Kitchen.. 1 By Mouth Daily 4)  Lisinopril 20 Mg Tabs (Lisinopril) .Marland Kitchen.. 1 By Mouth Two Times A Day 5)  K-Lor 20 Meq Pack (Potassium Chloride) .... Daily 6)  Multivitamins   Tabs (Multiple Vitamin) .... Every Other Day 7)  Vitamin E 600 Unit  Caps (Vitamin E) .... Every Other Day  Allergies (verified): 1)  ! * Shellfish  Vital Signs:  Patient profile:   50 year old male Height:      68 inches Weight:      174 pounds BMI:     26.55 Pulse rate:   62 / minute BP sitting:   122 / 74  (left arm) Cuff size:   regular  Vitals Entered By: Hardin Negus, RMA (Feb 03, 2010 3:24 PM)   Impression & Recommendations:  Problem # 1:  CAD, NATIVE VESSEL (ICD-414.01) Stable. No evidence of ischemia. Will add zocor.  Problem # 2:  SYSTOLIC HEART FAILURE, CHRONIC (ICD-428.22) Doing well. NYHA class I-II, probably II. Volume status looks good. Discussed ICD at length. he is interested but does not have any more time off from work this year  so would like to wait until 2012. This is ok given low risk profile. Was going to add spiro this visit but now will add zocor and start spiro soon.   Other Orders: EKG w/ Interpretation (93000)  Patient Instructions: 1)  Start Simvastatin 20mg  daily 2)  Your physician recommends that you return for a FASTING lipid, liver, bmet, bnp profile: 8 weeks 3)  Follow up in 4 months Prescriptions: SIMVASTATIN 20 MG TABS (SIMVASTATIN) Take one tablet by mouth daily at bedtime  #30 x 3   Entered by:   Meredith Staggers, RN   Authorized by:   [name], MD, [facility]   Signed by:   Meredith Staggers, RN on 02/03/2010   Method used:   Electronically to        [facility] (retail)       [address]       [place], [place]  [address]       Ph: [phone] or [phone]       Fax: [phone]   RxID:   [id number]

## 2010-10-27 NOTE — Cardiovascular Report (Signed)
Summary: Heart Failure Program Summary Report  Heart Failure Program Summary Report   Imported By: Roderic Ovens 12/25/2009 12:40:13  _____________________________________________________________________  External Attachment:    Type:   Image     Comment:   External Document

## 2010-10-27 NOTE — Progress Notes (Signed)
Summary: calling back   Phone Note Call from Patient Call back at Home Phone [phone]   Caller: Patient Reason for Call: Talk to Nurse Summary of Call: calling back . 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 there are boxes that wt around 70 lbs, he states b/c of his restrictions his job will not let him work and there is not anywhere else for them to place him, he is no out of work w/no money and needs either to get disability or return to work, will discuss w/Dr [employer] and call pt back [employer], RN  Feb 12, 2010 3:28 PM   Additional Follow-up for Phone Call Additional follow up Details #1::        spoke w/Dr Angelmarie Ponzo pt does not qualify for disability and pt can not lift over 35lbs explained to pt there is nothing we can do he should try working w/his job or may need to find another job [employer], RN  Feb 12, 2010 5:30 PM

## 2010-10-27 NOTE — Letter (Signed)
Summary: Return To Work  [facility], Main Office  [address]   [place], [place] [address]   Phone: [phone]  Fax: [phone]    10/27/2009  TO: Ria Bush Attn: Antionette Char Fax: [phone]   RE: Zachary Arias [id number] [id number]   The above named individual is under my medical care and may return to work on: Monday 11/03/09. He must be allowed rest breaks as needed and he is restricted to lifting 35 pounds maximum.  If you have any further questions or need additional information, please call.     Sincerely,   Dr. Arvilla Meres Sherri Rad, RN, BSN

## 2010-10-27 NOTE — Progress Notes (Signed)
Summary: need follow up appt added to note - and faxed  Phone Note Call from Patient Call back at Home Phone [phone]   Caller: Patient Reason for Call: Talk to Nurse Summary of Call: Per pt calling need extra info faxed to human resources, pt need follow up date include on noted to be faxed attention gretchren green fax # [phone]. Initial call taken by: Lorne Skeens,  Feb 04, 2010 2:54 PM  Follow-up for Phone Call        Spoke with pt. Patient states needs additional information typed  on return to work letter for human resources prior he can   go back to work. Need to add:( The F/U visit,July 26th 2011). Fax it att. Lucia Gaskins Fax # [phone]. Ollen Gross, RN, BSN  Feb 04, 2010 3:18 PM  F/U appointment date and time added to return to work letter and faxed to [employer]. Pt. aware.   Follow-up by: Ollen Gross, RN, BSN,  Feb 04, 2010 3:29 PM

## 2010-10-27 NOTE — Progress Notes (Signed)
Summary: refill request  Phone Note Refill Request Message from:  Patient on March 27, 2010 2:16 PM  Refills Requested: Medication #1:  SIMVASTATIN 20 MG TABS Take one tablet by mouth daily at bedtime. fax to [facility]  Initial call taken by: Glynda Jaeger,  March 27, 2010 2:16 PM  Follow-up for Phone Call        RX faxed to pharmacy. LMOM for pt. Marrion Coy, CNA  March 27, 2010 3:08 PM  Follow-up by: Marrion Coy, CNA,  March 27, 2010 3:08 PM    Prescriptions: SIMVASTATIN 20 MG TABS (SIMVASTATIN) Take one tablet by mouth daily at bedtime  #90 x 1   Entered by:   Marrion Coy, CNA   Authorized by:   [name], MD, [facility]   Signed by:   Marrion Coy, CNA on 03/27/2010   Method used:   Faxed to ...       [facility] (mail-order)             , [place]         Ph: [phone]       Fax: [phone]   RxID:   [id number]

## 2010-10-27 NOTE — Progress Notes (Signed)
Summary: refill  Phone Note Refill Request Message from:  Patient on December 15, 2009 1:40 PM  Refills Requested: Medication #1:  LISINOPRIL 20 MG TABS 1 by mouth two times a day   Supply Requested: 3 months [facility]   Method Requested: Fax to [facility] Initial call taken by: Migdalia Dk,  December 15, 2009 1:39 PM  Follow-up for Phone Call        pt is aware Follow-up by: Hardin Negus, RMA,  December 15, 2009 2:33 PM    Prescriptions: LISINOPRIL 20 MG TABS (LISINOPRIL) 1 by mouth two times a day  #90 x 3   Entered by:   Hardin Negus, RMA   Authorized by:   [name], MD, [facility]   Signed by:   Hardin Negus, RMA on 12/15/2009   Method used:   Electronically to        [facility]* (mail-order)             ,          Ph: [phone]       Fax: [phone]   RxID:   [id number]

## 2010-10-27 NOTE — Progress Notes (Signed)
Summary: [facility] NEED MORE INFORMATION ON WHEN THE PT CAN RETURN TO WORK  Phone Note Call from Patient Call back at [facility] Phone [phone]   Caller: Patient Summary of Call: PT CALLING REGARDING SOME INFORMATION BEING FAX TO [facility]  [phone] NOT ENOUGH INFORMATION ON WHEN THE PT CAN RETURN BACK TO WORK.AND ANY INCREASE OF PT MEDICATIONS Initial call taken by: Judie Grieve,  October 02, 2009 2:25 PM  Follow-up for Phone Call        Spoke with patient regarding Short term disability insurance. Pt. states the form send to the Insurance  needs more information; like expected date to return to work and any changes in medication. I let patient know will send this information to MD's nurse desktop. Ollen Gross, RN, BSN  October 02, 2009 2:45 PM   Additional Follow-up for Phone Call Additional follow up Details #1::        we will review this week. [name], MD, [facility]  October 02, 2009 11:32 PM  letter done and faxed to company, left detailed mess for pt Meredith Staggers, RN  October 03, 2009 12:08 PM

## 2010-10-27 NOTE — Progress Notes (Signed)
Summary: restriction   Phone Note From Other Clinic   Caller: TEDRA FROM [facility] [phone] EXT [phone]  Request: Talk with Nurse Details for Reason: how long is the restriction for .  Initial call taken by: Lorne Skeens,  October 28, 2009 12:17 PM  Follow-up for Phone Call        per [name], restrictions until further notice, Johnnette Barrios is aware Meredith Staggers, RN  October 28, 2009 1:40 PM

## 2010-10-27 NOTE — Letter (Signed)
Summary: Return To Work  [employer], Main Office  [address]   [place], [place] [address]   Phone: [phone]  Fax: [phone]    06/04/2010  TO: WHOM IT MAY CONCERN   RE: Zachary Arias [id number] [id number]   The above named individual is under my medical care and is clear to work with a few restrictions.  He may lift up to 35lbs with no restriction and he may lift objects 50lbs or more as much as he needs and can tolerate.  These restrictions will be re-evaluated in 3 months at his return visit.  If you have any further questions or need additional information, please call.     Sincerely,    Arvilla Meres, MD

## 2010-10-29 NOTE — Cardiovascular Report (Signed)
Summary: Health Management Program Summary Report   Health Management Program Summary Report   Imported By: Roderic Ovens 10/20/2010 15:21:13  _____________________________________________________________________  External Attachment:    Type:   Image     Comment:   External Document

## 2010-10-30 ENCOUNTER — Other Ambulatory Visit: Payer: Self-pay | Admitting: Internal Medicine

## 2010-10-30 ENCOUNTER — Encounter: Payer: Self-pay | Admitting: Internal Medicine

## 2010-10-30 ENCOUNTER — Ambulatory Visit: Admit: 2010-10-30 | Payer: Self-pay | Admitting: Internal Medicine

## 2010-10-30 ENCOUNTER — Ambulatory Visit (INDEPENDENT_AMBULATORY_CARE_PROVIDER_SITE_OTHER): Payer: 59 | Admitting: Internal Medicine

## 2010-10-30 DIAGNOSIS — I5022 Chronic systolic (congestive) heart failure: Secondary | ICD-10-CM

## 2010-10-30 DIAGNOSIS — I2510 Atherosclerotic heart disease of native coronary artery without angina pectoris: Secondary | ICD-10-CM

## 2010-10-30 LAB — BASIC METABOLIC PANEL
BUN: 21 mg/dL (ref 6–23)
CO2: 30 mEq/L (ref 19–32)
Calcium: 9.5 mg/dL (ref 8.4–10.5)
Chloride: 105 mEq/L (ref 96–112)
Creatinine, Ser: 1.2 mg/dL (ref 0.4–1.5)
GFR: 84.6 mL/min (ref >60.00)
Glucose, Bld: 94 mg/dL (ref 70–99)
Potassium: 4.4 mEq/L (ref 3.5–5.1)
Sodium: 141 mEq/L (ref 135–145)

## 2010-10-30 LAB — BRAIN NATRIURETIC PEPTIDE: Pro B Natriuretic peptide (BNP): 17.6 pg/mL (ref 0.0–100.0)

## 2010-11-04 NOTE — Letter (Signed)
Summary: Return To Work  [employer], Main Office  [address]   [place], [place] [address]   Phone: [phone]  Fax: [phone]    10/30/2010  TO: WHOM IT MAY CONCERN   RE: Zachary Arias [id number] [id number]   The above named individual is under my medical care and may resume work with no restrictions and rest breaks as needed.  If you have any further questions or need additional information, please call.     Sincerely,    Arvilla Meres, MD

## 2010-11-04 NOTE — Assessment & Plan Note (Signed)
Summary: 4 [employer] per pt call-mj/hms   Vital Signs:  Patient profile:   50 year old male Height:      68 inches Weight:      169 pounds BMI:     25.79 Pulse rate:   63 / minute BP sitting:   114 / 64  (left arm) Cuff size:   regular  Vitals Entered By: Hardin Negus, RMA (October 30, 2010 10:13 AM)  Visit Type:  Follow-up Referring Provider:  Bensimhon Primary Provider:  Dr Luz Brazen  CC:  no complaints.  History of Present Illness: 50 y/o male with HF due to mixed ischemic/no-ischemic CM (diagnosed 9/10) with EF 25-30% range. Cath with 1-v CAD with chronically occluded RCA. MRI in January 2011 with EF 31% with inferior scar.  Echo April 2011 showed EF 30-35%.  We discussed possibility of ICD in May and due to insurance reasons he wanted to wait until 2012.   Still working full time. Feels well. Works as a Administrator, [employer] - very physical labor.  No CP or SOB. No CP/SOB, edema, or palpitations. Feels like he can do anything he wants to without limitation. Volume status has been good. Did get lightheaded with spiro. So taking 25 on one day alternating with 12.5 the next.   ECG NSR 63. Non-specific ST-T wave abnormalities inferiorly.   Current Medications (verified): 1)  Carvedilol 25 Mg Tabs (Carvedilol) .... Take One Tablet By Mouth Twice A Day 2)  Digoxin 0.25 Mg Tabs (Digoxin) .... Take One Tablet By Mouth Daily 3)  Furosemide 20 Mg Tabs (Furosemide) .Marland Kitchen.. 1 By Mouth Daily 4)  Lisinopril 20 Mg Tabs (Lisinopril) .Marland Kitchen.. 1 By Mouth Two Times A Day 5)  K-Lor 20 Meq Pack (Potassium Chloride) .... Daily 6)  Multivitamins   Tabs (Multiple Vitamin) .... Every Other Day 7)  Vitamin E 600 Unit  Caps (Vitamin E) .... Every Other Day 8)  Simvastatin 20 Mg Tabs (Simvastatin) .... Take One Tablet By Mouth Daily At Bedtime 9)  Spironolactone 25 Mg Tabs (Spironolactone) .... Take 1 Tablet By Mouth Daily Alternating With 1/2 Tab  Allergies (verified): 1)  ! * Shellfish  Past  History:  Past Medical History: Last updated: 11/18/2009  1. Congestive heart failure secondary to mixed ischemic/nonischemia cardiomyopathy with       an ejection fraction of 25-30%.       --Cardiac MRI 1/11: EF 31% Inferior scar      --cath 2010: 1-v CAD with occluded distal RCA with collats. left system ok 2. Hypertension  Past Surgical History: Last updated: 07/07/2009  History of left inguinal hernia, status post repair in 2005.      Family History: Last updated: 10/20/2009 Mother died due to ovarian cancer.  Father is alive.   He has history of coronary artery disease and is status post myocardial   infarction.  He has a brother who has cardiomyopathy s/p AICD and is s/p valve surgery. The details are unclear.     Family History of Cardiomyopathy:  Family History of Coronary Artery Disease:  Family History of Hypertension:   Social History: Last updated: 05/27/2010  He is single.  He works as a Location manager.  He is   previously from the [place].  He does not smoke or use any   alcohol.  No reported drug use.   Risk Factors: Smoking Status: no (Oct 20, 2009)  Review of Systems       As per HPI and past medical history; otherwise all  systems negative.   Physical Exam  General:  Well appearing. no resp difficulty HEENT: normal Neck: supple. no JVD. Carotids 2+ bilat; no bruits. No lymphadenopathy or thryomegaly appreciated. Cor: PMI nondisplaced. Regular rate & rhythm. No rubs,  murmur. Lungs: clear Abdomen: soft, nontender, nondistended. No hepatosplenomegaly. No bruits or masses. Good bowel sounds. Extremities: no cyanosis, clubbing, rash, edema Neuro: alert & orientedx3, cranial nerves grossly intact. moves all 4 extremities w/o difficulty. affect pleasant    Impression & Recommendations:  Problem # 1:  SYSTOLIC HEART FAILURE, CHRONIC (ICD-428.22) Doing very well despite what appears to be persistent LV dysfunction. He is NYHA I and thus does not  qualify for an ICD. On very good medical regimen. Will check BMET and BNP today. Can work with no restrictions except rest breaks as needed.   Problem # 2:  CAD, NATIVE VESSEL (ICD-414.01) Stable. No evidence of ischemia. Continue current regimen.  Other Orders: EKG w/ Interpretation (93000)   Orders Added: 1)  EKG w/ Interpretation [93000]  Appended Document: orders & instructions    Clinical Lists Changes  Orders: Added new Test order of TLB-BMP (Basic Metabolic Panel-BMET) (80048-METABOL) - Signed Added new Test order of TLB-BNP (B-Natriuretic Peptide) (83880-BNPR) - Signed Added new Referral order of Echocardiogram (Echo) - Signed Observations: Added new observation of PI CARDIO: Labs today Your physician has requested that you have an echocardiogram.  Echocardiography is a painless test that uses sound waves to create images of your heart. It provides your doctor with information about the size and shape of your heart and how well your heart's chambers and valves are working.  This procedure takes approximately one hour. There are no restrictions for this procedure. Your physician wants you to follow-up in:  6 months.  You will receive a reminder letter in the mail two months in advance. If you don't receive a letter, please call our office to schedule the follow-up appointment. (10/30/2010 11:26)       Patient Instructions: 1)  Labs today 2)  Your physician has requested that you have an echocardiogram.  Echocardiography is a painless test that uses sound waves to create images of your heart. It provides your doctor with information about the size and shape of your heart and how well your heart's chambers and valves are working.  This procedure takes approximately one hour. There are no restrictions for this procedure. 3)  Your physician wants you to follow-up in:  6 months.  You will receive a reminder letter in the mail two months in advance. If you don't receive a letter,  please call our office to schedule the follow-up appointment.

## 2010-11-12 ENCOUNTER — Other Ambulatory Visit (HOSPITAL_COMMUNITY): Payer: 59

## 2010-11-30 ENCOUNTER — Other Ambulatory Visit (HOSPITAL_COMMUNITY): Payer: 59

## 2010-12-08 ENCOUNTER — Telehealth: Payer: Self-pay | Admitting: Internal Medicine

## 2010-12-13 LAB — BASIC METABOLIC PANEL
BUN: 16 mg/dL (ref 6–23)
CO2: 28 mEq/L (ref 19–32)
Calcium: 9.7 mg/dL (ref 8.4–10.5)
Chloride: 103 mEq/L (ref 96–112)
Creatinine, Ser: 1.1 mg/dL (ref 0.4–1.5)
GFR calc Af Amer: >60 mL/min (ref >60)
GFR calc non Af Amer: >60 mL/min (ref >60)
Glucose, Bld: 93 mg/dL (ref 70–99)
Potassium: 4.4 mEq/L (ref 3.5–5.1)
Sodium: 140 mEq/L (ref 135–145)

## 2010-12-15 NOTE — Progress Notes (Signed)
Summary: refill meds  Phone Note Refill Request Call back at Home Phone [phone] Message from:  Patient on December 08, 2010 8:22 AM  Refills Requested: Medication #1:  CARVEDILOL 25 MG TABS Take one tablet by mouth twice a day [facility].    Method Requested: Fax to [facility] Initial call taken by: Lorne Skeens,  December 08, 2010 8:23 AM    Prescriptions: CARVEDILOL 25 MG TABS (CARVEDILOL) Take one tablet by mouth twice a day  #180 Tablet x 4   Entered by:   Burnett Kanaris, CNA   Authorized by:   [name], MD, [facility]   Signed by:   Burnett Kanaris, CNA on 12/08/2010   Method used:   Electronically to        [facility] (retail)             ,          Ph: [phone]       Fax: [phone]   RxID:   [id number]

## 2010-12-28 ENCOUNTER — Other Ambulatory Visit (HOSPITAL_COMMUNITY): Payer: 59 | Admitting: Radiology

## 2010-12-31 LAB — POCT I-STAT 3, VENOUS BLOOD GAS (G3P V)
Acid-Base Excess: 1 mmol/L (ref 0.0–2.0)
Acid-Base Excess: 1 mmol/L (ref 0.0–2.0)
Acid-Base Excess: 2 mmol/L (ref 0.0–2.0)
Acid-Base Excess: 2 mmol/L (ref 0.0–2.0)
Acid-Base Excess: 3 mmol/L — ABNORMAL HIGH (ref 0.0–2.0)
Acid-base deficit: 2 mmol/L (ref 0.0–2.0)
Bicarbonate: 23.9 mEq/L (ref 20.0–24.0)
Bicarbonate: 25.8 mEq/L — ABNORMAL HIGH (ref 20.0–24.0)
Bicarbonate: 26.3 mEq/L — ABNORMAL HIGH (ref 20.0–24.0)
Bicarbonate: 27.3 mEq/L — ABNORMAL HIGH (ref 20.0–24.0)
Bicarbonate: 27.5 mEq/L — ABNORMAL HIGH (ref 20.0–24.0)
Bicarbonate: 28.3 mEq/L — ABNORMAL HIGH (ref 20.0–24.0)
O2 Saturation: 71 %
O2 Saturation: 76 %
O2 Saturation: 77 %
O2 Saturation: 78 %
O2 Saturation: 82 %
O2 Saturation: 84 %
TCO2: 25 mmol/L (ref 0–100)
TCO2: 27 mmol/L (ref 0–100)
TCO2: 28 mmol/L (ref 0–100)
TCO2: 29 mmol/L (ref 0–100)
TCO2: 29 mmol/L (ref 0–100)
TCO2: 30 mmol/L (ref 0–100)
pCO2, Ven: 41.1 mmHg — ABNORMAL LOW (ref 45.0–50.0)
pCO2, Ven: 42.5 mmHg — ABNORMAL LOW (ref 45.0–50.0)
pCO2, Ven: 44.1 mmHg — ABNORMAL LOW (ref 45.0–50.0)
pCO2, Ven: 44.8 mmHg — ABNORMAL LOW (ref 45.0–50.0)
pCO2, Ven: 45.3 mmHg (ref 45.0–50.0)
pCO2, Ven: 45.5 mmHg (ref 45.0–50.0)
pH, Ven: 7.33 — ABNORMAL HIGH (ref 7.250–7.300)
pH, Ven: 7.397 — ABNORMAL HIGH (ref 7.250–7.300)
pH, Ven: 7.4 — ABNORMAL HIGH (ref 7.250–7.300)
pH, Ven: 7.4 — ABNORMAL HIGH (ref 7.250–7.300)
pH, Ven: 7.402 — ABNORMAL HIGH (ref 7.250–7.300)
pH, Ven: 7.406 — ABNORMAL HIGH (ref 7.250–7.300)
pO2, Ven: 40 mmHg (ref 30.0–45.0)
pO2, Ven: 42 mmHg (ref 30.0–45.0)
pO2, Ven: 42 mmHg (ref 30.0–45.0)
pO2, Ven: 43 mmHg (ref 30.0–45.0)
pO2, Ven: 46 mmHg — ABNORMAL HIGH (ref 30.0–45.0)
pO2, Ven: 48 mmHg — ABNORMAL HIGH (ref 30.0–45.0)

## 2010-12-31 LAB — POCT I-STAT 3, ART BLOOD GAS (G3+)
Acid-Base Excess: 1 mmol/L (ref 0.0–2.0)
Acid-Base Excess: 2 mmol/L (ref 0.0–2.0)
Bicarbonate: 25.1 mEq/L — ABNORMAL HIGH (ref 20.0–24.0)
Bicarbonate: 27 mEq/L — ABNORMAL HIGH (ref 20.0–24.0)
O2 Saturation: 76 %
O2 Saturation: 99 %
TCO2: 26 mmol/L (ref 0–100)
TCO2: 28 mmol/L (ref 0–100)
pCO2 arterial: 37.7 mmHg (ref 35.0–45.0)
pCO2 arterial: 43.4 mmHg (ref 35.0–45.0)
pH, Arterial: 7.401 (ref 7.350–7.450)
pH, Arterial: 7.432 (ref 7.350–7.450)
pO2, Arterial: 132 mmHg — ABNORMAL HIGH (ref 80.0–100.0)
pO2, Arterial: 41 mmHg — ABNORMAL LOW (ref 80.0–100.0)

## 2010-12-31 LAB — BASIC METABOLIC PANEL
BUN: 13 mg/dL (ref 6–23)
BUN: 18 mg/dL (ref 6–23)
CO2: 28 mEq/L (ref 19–32)
CO2: 30 mEq/L (ref 19–32)
Calcium: 8.8 mg/dL (ref 8.4–10.5)
Calcium: 9.1 mg/dL (ref 8.4–10.5)
Chloride: 102 mEq/L (ref 96–112)
Chloride: 103 mEq/L (ref 96–112)
Creatinine, Ser: 1.2 mg/dL (ref 0.4–1.5)
Creatinine, Ser: 1.37 mg/dL (ref 0.4–1.5)
GFR calc Af Amer: >60 mL/min (ref >60)
GFR calc Af Amer: >60 mL/min (ref >60)
GFR calc non Af Amer: 56 mL/min — ABNORMAL LOW (ref >60)
GFR calc non Af Amer: >60 mL/min (ref >60)
Glucose, Bld: 80 mg/dL (ref 70–99)
Glucose, Bld: 92 mg/dL (ref 70–99)
Potassium: 4.2 mEq/L (ref 3.5–5.1)
Potassium: 4.8 mEq/L (ref 3.5–5.1)
Sodium: 138 mEq/L (ref 135–145)
Sodium: 138 mEq/L (ref 135–145)

## 2010-12-31 LAB — CBC
HCT: 44.9 % (ref 39.0–52.0)
Hemoglobin: 15 g/dL (ref 13.0–17.0)
MCHC: 33.3 g/dL (ref 30.0–36.0)
MCV: 86.5 fL (ref 78.0–100.0)
Platelets: 273 10*3/uL (ref 150–400)
RBC: 5.19 MIL/uL (ref 4.22–5.81)
RDW: 15.5 % (ref 11.5–15.5)
WBC: 14.9 10*3/uL — ABNORMAL HIGH (ref 4.0–10.5)

## 2010-12-31 LAB — BRAIN NATRIURETIC PEPTIDE
Pro B Natriuretic peptide (BNP): 232 pg/mL — ABNORMAL HIGH (ref 0.0–100.0)
Pro B Natriuretic peptide (BNP): 260 pg/mL — ABNORMAL HIGH (ref 0.0–100.0)

## 2010-12-31 LAB — PROTIME-INR
INR: 1 (ref 0.00–1.49)
Prothrombin Time: 12.8 seconds (ref 11.6–15.2)

## 2010-12-31 LAB — APTT: aPTT: 27 seconds (ref 24–37)

## 2011-01-01 LAB — COMPREHENSIVE METABOLIC PANEL
ALT: 89 U/L — ABNORMAL HIGH (ref 0–53)
AST: 49 U/L — ABNORMAL HIGH (ref 0–37)
Albumin: 3.4 g/dL — ABNORMAL LOW (ref 3.5–5.2)
Alkaline Phosphatase: 39 U/L (ref 39–117)
BUN: 17 mg/dL (ref 6–23)
CO2: 32 mEq/L (ref 19–32)
Calcium: 9.3 mg/dL (ref 8.4–10.5)
Chloride: 102 mEq/L (ref 96–112)
Creatinine, Ser: 1.67 mg/dL — ABNORMAL HIGH (ref 0.4–1.5)
GFR calc Af Amer: 54 mL/min — ABNORMAL LOW (ref >60)
GFR calc non Af Amer: 44 mL/min — ABNORMAL LOW (ref >60)
Glucose, Bld: 120 mg/dL — ABNORMAL HIGH (ref 70–99)
Potassium: 4.5 mEq/L (ref 3.5–5.1)
Sodium: 143 mEq/L (ref 135–145)
Total Bilirubin: 1 mg/dL (ref 0.3–1.2)
Total Protein: 5.8 g/dL — ABNORMAL LOW (ref 6.0–8.3)

## 2011-01-01 LAB — URINALYSIS, ROUTINE W REFLEX MICROSCOPIC
Bilirubin Urine: NEGATIVE
Glucose, UA: NEGATIVE mg/dL
Ketones, ur: NEGATIVE mg/dL
Leukocytes, UA: NEGATIVE
Nitrite: NEGATIVE
Protein, ur: NEGATIVE mg/dL
Specific Gravity, Urine: 1.02 (ref 1.005–1.030)
Urobilinogen, UA: 0.2 mg/dL (ref 0.0–1.0)
pH: 7 (ref 5.0–8.0)

## 2011-01-01 LAB — DIFFERENTIAL
Basophils Absolute: 0.2 10*3/uL — ABNORMAL HIGH (ref 0.0–0.1)
Basophils Relative: 2 % — ABNORMAL HIGH (ref 0–1)
Eosinophils Absolute: 0 10*3/uL (ref 0.0–0.7)
Eosinophils Relative: 0 % (ref 0–5)
Lymphocytes Relative: 18 % (ref 12–46)
Lymphs Abs: 2.1 10*3/uL (ref 0.7–4.0)
Monocytes Absolute: 0.7 10*3/uL (ref 0.1–1.0)
Monocytes Relative: 6 % (ref 3–12)
Neutro Abs: 9 10*3/uL — ABNORMAL HIGH (ref 1.7–7.7)
Neutrophils Relative %: 75 % (ref 43–77)

## 2011-01-01 LAB — BASIC METABOLIC PANEL
BUN: 17 mg/dL (ref 6–23)
BUN: 19 mg/dL (ref 6–23)
CO2: 24 mEq/L (ref 19–32)
CO2: 30 mEq/L (ref 19–32)
Calcium: 8.8 mg/dL (ref 8.4–10.5)
Calcium: 9.5 mg/dL (ref 8.4–10.5)
Chloride: 105 mEq/L (ref 96–112)
Chloride: 99 mEq/L (ref 96–112)
Creatinine, Ser: 1.1 mg/dL (ref 0.4–1.5)
Creatinine, Ser: 1.39 mg/dL (ref 0.4–1.5)
GFR calc Af Amer: >60 mL/min (ref >60)
GFR calc Af Amer: >60 mL/min (ref >60)
GFR calc non Af Amer: 55 mL/min — ABNORMAL LOW (ref >60)
GFR calc non Af Amer: >60 mL/min (ref >60)
Glucose, Bld: 102 mg/dL — ABNORMAL HIGH (ref 70–99)
Glucose, Bld: 108 mg/dL — ABNORMAL HIGH (ref 70–99)
Potassium: 4.5 mEq/L (ref 3.5–5.1)
Potassium: 4.8 mEq/L (ref 3.5–5.1)
Sodium: 137 mEq/L (ref 135–145)
Sodium: 138 mEq/L (ref 135–145)

## 2011-01-01 LAB — POCT I-STAT 4, (NA,K, GLUC, HGB,HCT)
Glucose, Bld: 123 mg/dL — ABNORMAL HIGH (ref 70–99)
HCT: 48 % (ref 39.0–52.0)
Hemoglobin: 16.3 g/dL (ref 13.0–17.0)
Potassium: 3.9 mEq/L (ref 3.5–5.1)
Sodium: 139 mEq/L (ref 135–145)

## 2011-01-01 LAB — D-DIMER, QUANTITATIVE (NOT AT ARMC): D-Dimer, Quant: 0.22 ug/mL-FEU (ref 0.00–0.48)

## 2011-01-01 LAB — POCT CARDIAC MARKERS
CKMB, poc: 4.8 ng/mL (ref 1.0–8.0)
Myoglobin, poc: 105 ng/mL (ref 12–200)
Troponin i, poc: <0.05 ng/mL (ref 0.00–0.09)

## 2011-01-01 LAB — CBC
HCT: 45 % (ref 39.0–52.0)
Hemoglobin: 14.8 g/dL (ref 13.0–17.0)
MCHC: 32.9 g/dL (ref 30.0–36.0)
MCV: 86.3 fL (ref 78.0–100.0)
Platelets: 270 10*3/uL (ref 150–400)
RBC: 5.21 MIL/uL (ref 4.22–5.81)
RDW: 14.2 % (ref 11.5–15.5)
WBC: 12 10*3/uL — ABNORMAL HIGH (ref 4.0–10.5)

## 2011-01-01 LAB — POCT TOXICOLOGY PANEL

## 2011-01-01 LAB — HEPATITIS PANEL, ACUTE
HCV Ab: NEGATIVE
Hep A IgM: NEGATIVE
Hep B C IgM: NEGATIVE
Hepatitis B Surface Ag: NEGATIVE

## 2011-01-01 LAB — CULTURE, BLOOD (ROUTINE X 2)
Culture: NO GROWTH
Culture: NO GROWTH

## 2011-01-01 LAB — POCT B-TYPE NATRIURETIC PEPTIDE (BNP): B Natriuretic Peptide, POC: 731 pg/mL — ABNORMAL HIGH (ref 0–100)

## 2011-01-01 LAB — HIV ANTIBODY (ROUTINE TESTING W REFLEX): HIV: NONREACTIVE

## 2011-01-01 LAB — URINE MICROSCOPIC-ADD ON

## 2011-01-01 LAB — TSH: TSH: 1.914 u[IU]/mL (ref 0.350–4.500)

## 2011-01-01 LAB — BRAIN NATRIURETIC PEPTIDE: Pro B Natriuretic peptide (BNP): 181 pg/mL — ABNORMAL HIGH (ref 0.0–100.0)

## 2011-01-01 LAB — MRSA PCR SCREENING: MRSA by PCR: NEGATIVE

## 2011-01-01 LAB — SEDIMENTATION RATE: Sed Rate: 1 mm/hr (ref 0–16)

## 2011-01-01 LAB — ANA: Anti Nuclear Antibody(ANA): NEGATIVE

## 2011-01-11 ENCOUNTER — Other Ambulatory Visit (HOSPITAL_COMMUNITY): Payer: 59 | Admitting: Radiology

## 2011-01-29 ENCOUNTER — Other Ambulatory Visit (HOSPITAL_COMMUNITY): Payer: Self-pay | Admitting: Radiology

## 2011-01-29 DIAGNOSIS — I509 Heart failure, unspecified: Secondary | ICD-10-CM

## 2011-02-01 ENCOUNTER — Telehealth: Payer: Self-pay | Admitting: Internal Medicine

## 2011-02-01 ENCOUNTER — Other Ambulatory Visit (HOSPITAL_COMMUNITY): Payer: 59 | Admitting: Radiology

## 2011-02-01 NOTE — Telephone Encounter (Signed)
Pt wants to know can he takes a calicum supplement.

## 2011-02-02 NOTE — Telephone Encounter (Signed)
Advised ok, pt aware

## 2011-02-12 NOTE — H&P (Signed)
Zachary Arias, Zachary Arias                           ACCOUNT NO.:  [id number]   MEDICAL RECORD NO.:  [id number]                   PATIENT TYPE:  EMS   LOCATION:  ED                                   FACILITY:  [facility]   PHYSICIAN:  Abigail Miyamoto, M.D.              DATE OF BIRTH:  12/16/1960   DATE OF ADMISSION:  01/27/2004  DATE OF DISCHARGE:                                HISTORY & PHYSICAL   CHIEF COMPLAINT:  Incarcerated inguinal hernia.   HISTORY:  This is a pleasant 50 year old gentleman who reports that he has  had a right inguinal hernia for at least 3 or 4 years.  It has always been  easily reducible; however, it became incarcerated earlier today.  He began  developing abdominal pain, nausea and vomiting.  He has currently been  medicated but is awake.  He complains of pain in the scrotal area but no  deep abdominal pain.  The nausea medicine also helped relieve his nausea and  his emesis.  He has had no chest pain, fever, or shortness of breath.  Again, he reports that he has had the hernia for some time, and it is always  easily reduced until now.   PAST MEDICAL HISTORY:  Negative.   PAST SURGICAL HISTORY:  Left inguinal hernia repair with mesh in [place].   MEDICATIONS:  None.   ALLERGIES:  No known drug allergies.   SOCIAL HISTORY:  He does nor smoke or drink alcohol.  He works in a  warehouse in doing heavy lifting.   REVIEW OF SYSTEMS:  Negative from a cardiopulmonary standpoint.  It is  otherwise as above.  Again, there is no dysuria.   PHYSICAL EXAMINATION:  VITAL SIGNS:  Blood pressure 147/101, respiratory  rate 18, pulse 78.  GENERAL:  A well-developed and well-nourished gentleman in no acute  distress.  Generally, he is slightly medicated but well in appearance.  HEENT:  Eyes:  Anicteric.  Pupils are reactive bilaterally.  Ears, mouth,  throat:  No external lesions.  Nose is normal.  Hearing is normal.  Oropharynx clear.  NECK:  Supple.  There is no cervical  adenopathy.  There is no thyromegaly.  LUNGS:  Clear to auscultation bilaterally with normal respiratory effort.  HEART:  Regular rate and rhythm with no murmurs.  There is no peripheral  edema.  ABDOMEN:  Soft and not appreciably tender diffusely, but he does have a  tender, incarcerated right inguinal hernia that goes down into his scrotum  and is nonreducible, even with the patient lying and Valsalva.  Testicles  are descended bilaterally.  There is a well-healed incision on the left  abdomen with no evidence of hernia there.  EXTREMITIES:  Warm and well perfused.   X-RAY/DATA:  Patient has an elevated white blood count of 18.9.   IMPRESSION/PLAN:  Patient with an incarcerated right inguinal hernia.  At  this point,  given the elevated white count, the tenderness is worrisome for  a compromised bowel.  At this point, emergent repair is recommended.  Dr.  Orson Slick is currently on call, and I am seeing this patient for him.  Dr.  Orson Slick will proceed with the surgery emergently this evening or I will,  depending on whoever is available first.  I have discussed the risks of  surgery with him, including bleeding, infection, as well as the use of mesh,  unless of course, there is compromised bowel.  He also understands the risk  of possible bowel resection.  At this point, he wishes to proceed with  surgery, thus he will be scheduled emergently.                                               Abigail Miyamoto, M.D.    DB/MEDQ  D:  01/27/2004  T:  01/27/2004  Job:  [id number]

## 2011-02-12 NOTE — Op Note (Signed)
Zachary Arias, Zachary Arias                           ACCOUNT NO.:  [id number]   MEDICAL RECORD NO.:  [id number]                   PATIENT TYPE:  INP   LOCATION:  0101                                 FACILITY:  [facility]   PHYSICIAN:  Lorre Munroe., M.D.            DATE OF BIRTH:  May 21, 1961   DATE OF PROCEDURE:  01/27/2004  DATE OF DISCHARGE:                                 OPERATIVE REPORT   PREOPERATIVE DIAGNOSES:  Incarcerated right inguinal hernia.   POSTOPERATIVE DIAGNOSES:  Incarcerated right inguinal hernia.   OPERATION:  Repair of incarcerated right inguinal hernia.   SURGEON:  Lebron Conners, M.D.   ANESTHESIA:  General.   DESCRIPTION OF PROCEDURE:  After the patient was monitored and anesthetized  and had routine preparation and draping of the abdomen and scrotum, I made  an oblique incision beginning just at the pubic tubercle and going laterally  about 7 cm in length.  I dissected down to the fat until I came to the  external oblique and carefully opened it in the direction of the fibers into  the external ring freeing up pressure on the hernia.  The hernia was  extremely large, very large in the inguinal canal and filling the scrotum. I  incised the cord longitudinally near the deep ring and opened into the  hernia sac and pulled out a large amount of small intestine most of which  was quite dusky.  A bit of if pinked up quickly and I reduced that and then  observed the remainder until it developed good peristalsis and congestion  resolved and it became pink and obviously healthy.  I thoroughly inspected  it and felt there was no sign of ischemia.  I then reduced all of the small  bowel.  I divided the hernia sac several centimeters distal to the deep ring  taking care to avoid injury to the vas deferens and cord vessels. I then  suture ligated the hernia sac at the level of the deep ring and amputated a  small part of it leaving most of the distal sac intact.  I then  plugged the  superior aspect of the deep ring with a plug of polypropylene mesh held in  with a single stitch of 2-0 silk in the internal oblique. I fashioned a  patch of polypropylene mesh cut with a slit in it to allow exit of the  spermatic cord and sewed it in place with 2-0 Prolene beginning at the pubic  tubercle and sewing superiorly and medially with a basting stitch in the  internal oblique and laterally and inferiorly with a running simple stitch  in the shelving edge of the inguinal ligament.  I used a single stitch  lateral to the cord and deep ring to join the tails together and felt the  hernia repair was good.  I got good hemostasis with cautery and then  thoroughly anesthetized the deep  and superficial parts of the wound.  I  closed the external oblique and the subcutaneous tissues with 3-0 Vicryl and  closed the skin with staples.  He was stable through the procedure.                                               Lorre Munroe., M.D.    Jodi Marble  D:  01/27/2004  T:  01/28/2004  Job:  [id number]

## 2011-02-19 ENCOUNTER — Other Ambulatory Visit (HOSPITAL_COMMUNITY): Payer: 59 | Admitting: Radiology

## 2011-02-20 ENCOUNTER — Telehealth: Payer: Self-pay | Admitting: Internal Medicine

## 2011-02-23 NOTE — Telephone Encounter (Signed)
Refill med's- [facility] lisinprol 20 mg

## 2011-02-24 ENCOUNTER — Other Ambulatory Visit: Payer: Self-pay | Admitting: *Deleted

## 2011-02-24 MED ORDER — LISINOPRIL 20 MG PO TABS: 40.0000 mg | ORAL_TABLET | Freq: Every day | ORAL | Status: DC

## 2011-03-01 ENCOUNTER — Other Ambulatory Visit (HOSPITAL_COMMUNITY): Payer: 59

## 2011-03-29 ENCOUNTER — Other Ambulatory Visit (HOSPITAL_COMMUNITY): Payer: 59

## 2011-04-05 ENCOUNTER — Other Ambulatory Visit (HOSPITAL_COMMUNITY): Payer: 59 | Admitting: Radiology

## 2011-05-03 ENCOUNTER — Other Ambulatory Visit (HOSPITAL_COMMUNITY): Payer: 59 | Admitting: Radiology

## 2011-05-28 ENCOUNTER — Other Ambulatory Visit: Payer: Self-pay | Admitting: Internal Medicine

## 2011-06-14 ENCOUNTER — Other Ambulatory Visit (HOSPITAL_COMMUNITY): Payer: 59 | Admitting: Radiology

## 2011-06-28 ENCOUNTER — Other Ambulatory Visit (HOSPITAL_COMMUNITY): Payer: 59 | Admitting: Radiology

## 2011-07-06 ENCOUNTER — Other Ambulatory Visit: Payer: Self-pay | Admitting: Internal Medicine

## 2011-07-08 ENCOUNTER — Other Ambulatory Visit: Payer: Self-pay | Admitting: *Deleted

## 2011-07-08 ENCOUNTER — Encounter: Payer: Self-pay | Admitting: Internal Medicine

## 2011-07-08 MED ORDER — DIGOXIN 250 MCG PO TABS: 250.0000 ug | ORAL_TABLET | Freq: Every day | ORAL | Status: DC

## 2011-07-08 MED ORDER — FUROSEMIDE 20 MG PO TABS: 20.0000 mg | ORAL_TABLET | Freq: Every day | ORAL | Status: DC

## 2011-07-08 NOTE — Telephone Encounter (Signed)
Pt wants refill fursomide tab, [facility]

## 2011-07-08 NOTE — Telephone Encounter (Signed)
This encounter was created in error - please disregard.

## 2011-07-14 ENCOUNTER — Telehealth: Payer: Self-pay | Admitting: Internal Medicine

## 2011-07-14 NOTE — Telephone Encounter (Signed)
Pt was notified that no appts were available at 4:00pm that day and he decided to keep original appt.

## 2011-07-14 NOTE — Telephone Encounter (Signed)
Pt called about echo he is to have done on Monday He wants to know about coming in at 4pm please call and leave message

## 2011-07-19 ENCOUNTER — Other Ambulatory Visit (HOSPITAL_COMMUNITY): Payer: 59 | Admitting: Radiology

## 2011-08-08 ENCOUNTER — Other Ambulatory Visit: Payer: Self-pay | Admitting: Internal Medicine

## 2011-08-11 ENCOUNTER — Ambulatory Visit: Payer: 59 | Admitting: Internal Medicine

## 2011-10-19 ENCOUNTER — Ambulatory Visit: Payer: 59 | Admitting: Internal Medicine

## 2011-11-16 ENCOUNTER — Other Ambulatory Visit (HOSPITAL_COMMUNITY): Payer: Self-pay | Admitting: Internal Medicine

## 2011-12-12 ENCOUNTER — Other Ambulatory Visit: Payer: Self-pay | Admitting: Internal Medicine

## 2012-01-22 ENCOUNTER — Other Ambulatory Visit: Payer: Self-pay | Admitting: Internal Medicine

## 2012-02-06 ENCOUNTER — Other Ambulatory Visit (HOSPITAL_COMMUNITY): Payer: Self-pay | Admitting: Internal Medicine

## 2012-03-07 ENCOUNTER — Ambulatory Visit: Payer: 59 | Admitting: Internal Medicine

## 2012-03-20 ENCOUNTER — Other Ambulatory Visit: Payer: Self-pay | Admitting: Internal Medicine

## 2012-03-20 ENCOUNTER — Telehealth (HOSPITAL_COMMUNITY): Payer: Self-pay | Admitting: *Deleted

## 2012-03-20 NOTE — Telephone Encounter (Signed)
eror

## 2012-03-21 ENCOUNTER — Telehealth: Payer: Self-pay | Admitting: Internal Medicine

## 2012-03-27 ENCOUNTER — Other Ambulatory Visit (HOSPITAL_COMMUNITY): Payer: Self-pay | Admitting: Internal Medicine

## 2012-03-27 DIAGNOSIS — I5022 Chronic systolic (congestive) heart failure: Secondary | ICD-10-CM

## 2012-05-01 ENCOUNTER — Ambulatory Visit (HOSPITAL_COMMUNITY): Payer: BC Managed Care – HMO

## 2012-05-08 ENCOUNTER — Ambulatory Visit (HOSPITAL_COMMUNITY)
Admission: RE | Admit: 2012-05-08 | Discharge: 2012-05-08 | Disposition: A | Discharge status: Home / Self Care | Payer: BC Managed Care – HMO | Source: Ambulatory Visit | Attending: Internal Medicine | Admitting: Internal Medicine

## 2012-05-08 ENCOUNTER — Ambulatory Visit (HOSPITAL_BASED_OUTPATIENT_CLINIC_OR_DEPARTMENT_OTHER)
Admission: RE | Admit: 2012-05-08 | Discharge: 2012-05-08 | Disposition: A | Discharge status: Home / Self Care | Payer: BC Managed Care – HMO | Source: Ambulatory Visit | Attending: Internal Medicine | Admitting: Internal Medicine

## 2012-05-08 ENCOUNTER — Encounter (HOSPITAL_COMMUNITY): Payer: Self-pay

## 2012-05-08 VITALS — BP 130/64 | HR 67 | Resp 18 | Ht 68.0 in | Wt 167.0 lb

## 2012-05-08 DIAGNOSIS — I5022 Chronic systolic (congestive) heart failure: Secondary | ICD-10-CM

## 2012-05-08 DIAGNOSIS — I059 Rheumatic mitral valve disease, unspecified: Secondary | ICD-10-CM | POA: Insufficient documentation

## 2012-05-08 DIAGNOSIS — I079 Rheumatic tricuspid valve disease, unspecified: Secondary | ICD-10-CM | POA: Insufficient documentation

## 2012-05-08 DIAGNOSIS — I369 Nonrheumatic tricuspid valve disorder, unspecified: Secondary | ICD-10-CM

## 2012-05-08 DIAGNOSIS — I509 Heart failure, unspecified: Secondary | ICD-10-CM | POA: Insufficient documentation

## 2012-05-08 DIAGNOSIS — I10 Essential (primary) hypertension: Secondary | ICD-10-CM

## 2012-05-08 DIAGNOSIS — I2510 Atherosclerotic heart disease of native coronary artery without angina pectoris: Secondary | ICD-10-CM

## 2012-05-08 MED ORDER — CARVEDILOL 25 MG PO TABS: 25.0000 mg | ORAL_TABLET | Freq: Two times a day (BID) | ORAL | Status: DC

## 2012-05-08 MED ORDER — SIMVASTATIN 20 MG PO TABS: 20.0000 mg | ORAL_TABLET | Freq: Every day | ORAL | Status: DC

## 2012-05-08 MED ORDER — FUROSEMIDE 20 MG PO TABS: 20.0000 mg | ORAL_TABLET | Freq: Every day | ORAL | Status: DC | PRN

## 2012-05-08 MED ORDER — POTASSIUM CHLORIDE CRYS ER 20 MEQ PO TBCR: 20.0000 meq | EXTENDED_RELEASE_TABLET | Freq: Every day | ORAL | Status: DC | PRN

## 2012-05-08 MED ORDER — SPIRONOLACTONE 25 MG PO TABS: 25.0000 mg | ORAL_TABLET | Freq: Every day | ORAL | Status: DC

## 2012-05-08 MED ORDER — LISINOPRIL 20 MG PO TABS: 20.0000 mg | ORAL_TABLET | Freq: Two times a day (BID) | ORAL | Status: DC

## 2012-05-08 NOTE — Progress Notes (Signed)
PCP: Dr. Luz Brazen  HPI: 58 year gentlemen with history of HF due to mixed ischemic/no-ischemic CM (diagnosed 9/10) with EF 25-30% range. Cath with 1-v CAD with chronically occluded RCA. MRI in January 2011 with EF 31% with inferior scar.   Echo April 2011 showed EF 30-35%.  He returns for follow up today.  He continues to work full time and is actually working 7 days a week most of the time.  No chest pain.  Occ dizziness when he bends over.  Denies SOB/orthopnea/PND.  No edema.  He has a difficult time finding rides to appointments.  He is compliant with medications.  Prelim echo results today: EF 50-55%   ROS: All systems negative except as listed in HPI, PMH and Problem List.  Past Medical History  Diagnosis Date  . Congestive heart failure (CHF)     secondary to ischemic cardiomyopathy with an ejection fraction of 25-30%  . Coronary artery disease   . Tachycardia 2010      Unexplained tachycardia during hospitalization  . History of hypertension   . Inguinal hernia 2005    History of left inguinal hernia, status post repair in 2005    Current Outpatient Prescriptions  Medication Sig Dispense Refill  . Calcium Carbonate-Vitamin D (CALCIUM + D PO) Take 1 tablet by mouth 2 (two) times a week.      . carvedilol (COREG) 25 MG tablet TAKE 1 TABLET TWICE A DAY  180 tablet  0  . digoxin (LANOXIN) 0.25 MG tablet TAKE 1 TABLET DAILY (KEEP OFFICE VISIT IN JUNE TO RECEIVE FURTHER REFILLS)  90 tablet  2  . furosemide (LASIX) 20 MG tablet TAKE 1 TABLET DAILY (KEEP OFFICE VISIT IN JUNE TO RECEIVE FURTHER REFILLS)  90 tablet  2  . lisinopril (PRINIVIL,ZESTRIL) 20 MG tablet TAKE 1 TABLET TWICE A DAY  180 tablet  0  . Multiple Vitamins-Minerals (CENTRUM ULTRA MENS PO) Take 1 tablet by mouth 4 (four) times a week.      . potassium chloride SA (K-DUR,KLOR-CON) 20 MEQ tablet TAKE 1 TABLET DAILY  90 tablet  0  . simvastatin (ZOCOR) 20 MG tablet TAKE 1 TABLET DAILY AT BEDTIME  90 tablet  0  .  spironolactone (ALDACTONE) 25 MG tablet TAKE 1 TABLET DAILY ALTERNATING WITH ONE-HALF (1/2) TABLET  90 tablet  0  . vitamin E 600 UNIT capsule Take 600 Units by mouth once a week.         PHYSICAL EXAM: Filed Vitals:   05/08/12 1011  BP: 130/64  Pulse: 67  Resp: 18  Height: 5\' 8"  (1.727 m)  Weight: 167 lb (75.751 kg)  SpO2: 98%    General:  Well appearing. No resp difficulty HEENT: normal Neck: supple. JVP flat. Carotids 2+ bilaterally; no bruits. No lymphadenopathy or thryomegaly appreciated. Cor: PMI normal. Regular rate & rhythm. No rubs, gallops or murmurs. Lungs: clear Abdomen: soft, nontender, nondistended. No hepatosplenomegaly. No bruits or masses. Good bowel sounds. Extremities: no cyanosis, clubbing, rash, edema Neuro: alert & orientedx3, cranial nerves grossly intact. Moves all 4 extremities w/o difficulty. Affect pleasant.      ASSESSMENT & PLAN:

## 2012-05-08 NOTE — Assessment & Plan Note (Addendum)
No ischemic symptoms.  Continue statin.  Attending: Agree.

## 2012-05-08 NOTE — Assessment & Plan Note (Addendum)
Controlled.  Continue current regimen.  Attending: Agreed.

## 2012-05-08 NOTE — Progress Notes (Signed)
*  PRELIMINARY RESULTS* Echocardiogram 2D Echocardiogram has been performed.  Jeryl Columbia 05/08/2012, 9:37 AM

## 2012-05-08 NOTE — Patient Instructions (Addendum)
Stop digoxin.  Only take laisx and potassium if weight increases 2-3 pounds in 24 hours or increased shortness of breath.    Follow up 9 months.

## 2012-05-08 NOTE — Assessment & Plan Note (Addendum)
EF has recovered per echo.  NYHA I.  Will stop digoxin.  With dizziness will also use lasix as needed.  Continue spiro, lisinopril and carvedilol.  Have discussed need to continue med compliance.  Patient seen and examined with Ulyess Blossom, PA-C. We discussed all aspects of the encounter. I agree with the assessment and plan as stated above. Echo reviewed in clinic personally. EF now totally normal. Can stop dig. Change lasix to prn only. Reinforced need for daily weights and reviewed use of sliding scale diuretics. Continue the rest of his regimen.

## 2012-08-23 ENCOUNTER — Other Ambulatory Visit (HOSPITAL_COMMUNITY): Payer: Self-pay | Admitting: *Deleted

## 2012-08-23 MED ORDER — SPIRONOLACTONE 25 MG PO TABS: 25.0000 mg | ORAL_TABLET | Freq: Every day | ORAL | Status: DC

## 2013-01-15 ENCOUNTER — Other Ambulatory Visit (HOSPITAL_COMMUNITY): Payer: Self-pay | Admitting: Physician Assistant

## 2013-02-05 ENCOUNTER — Encounter (HOSPITAL_COMMUNITY): Payer: BC Managed Care – HMO

## 2013-04-03 ENCOUNTER — Other Ambulatory Visit (HOSPITAL_COMMUNITY): Payer: Self-pay | Admitting: Physician Assistant

## 2013-06-11 ENCOUNTER — Ambulatory Visit (HOSPITAL_COMMUNITY)
Admission: RE | Admit: 2013-06-11 | Discharge: 2013-06-11 | Disposition: A | Discharge status: Home / Self Care | Payer: BC Managed Care – HMO | Source: Ambulatory Visit | Attending: Cardiology | Admitting: Cardiology

## 2013-06-11 VITALS — BP 118/78 | HR 68 | Wt 172.2 lb

## 2013-06-11 DIAGNOSIS — I2510 Atherosclerotic heart disease of native coronary artery without angina pectoris: Secondary | ICD-10-CM

## 2013-06-11 DIAGNOSIS — I5022 Chronic systolic (congestive) heart failure: Secondary | ICD-10-CM

## 2013-06-11 DIAGNOSIS — I509 Heart failure, unspecified: Secondary | ICD-10-CM

## 2013-06-11 LAB — BASIC METABOLIC PANEL
BUN: 15 mg/dL (ref 6–23)
CO2: 26 mEq/L (ref 19–32)
Calcium: 9.6 mg/dL (ref 8.4–10.5)
Chloride: 101 mEq/L (ref 96–112)
Creatinine, Ser: 1.07 mg/dL (ref 0.50–1.35)
GFR calc Af Amer: >90 mL/min (ref >90)
GFR calc non Af Amer: 79 mL/min — ABNORMAL LOW (ref >90)
Glucose, Bld: 94 mg/dL (ref 70–99)
Potassium: 4.7 mEq/L (ref 3.5–5.1)
Sodium: 137 mEq/L (ref 135–145)

## 2013-06-11 LAB — LIPID PANEL
Cholesterol: 192 mg/dL (ref 0–200)
HDL: 40 mg/dL (ref >39)
LDL Cholesterol: 100 mg/dL — ABNORMAL HIGH (ref 0–99)
Total CHOL/HDL Ratio: 4.8 RATIO
Triglycerides: 259 mg/dL — ABNORMAL HIGH (ref <150)
VLDL: 52 mg/dL — ABNORMAL HIGH (ref 0–40)

## 2013-06-11 LAB — PRO B NATRIURETIC PEPTIDE: Pro B Natriuretic peptide (BNP): 25.5 pg/mL (ref 0–125)

## 2013-06-11 MED ORDER — SIMVASTATIN 20 MG PO TABS: ORAL_TABLET | ORAL | Status: DC

## 2013-06-11 MED ORDER — CARVEDILOL 25 MG PO TABS: ORAL_TABLET | ORAL | Status: DC

## 2013-06-11 MED ORDER — ASPIRIN EC 81 MG PO TBEC: 81.0000 mg | DELAYED_RELEASE_TABLET | Freq: Every day | ORAL | Status: DC

## 2013-06-11 MED ORDER — SPIRONOLACTONE 25 MG PO TABS: 25.0000 mg | ORAL_TABLET | Freq: Every day | ORAL | Status: DC

## 2013-06-11 MED ORDER — LISINOPRIL 20 MG PO TABS: ORAL_TABLET | ORAL | Status: DC

## 2013-06-11 NOTE — Progress Notes (Signed)
Patient ID: Zachary Arias, male   DOB: [birth year], 52 y.o.   MRN: [id number] PCP: [name]  HPI: 64 year gentlemen with history of HF due to mixed ischemic/no-ischemic CM (diagnosed 9/10) with EF 25-30% range. Cath with 1-v CAD with chronically occluded RCA. MRI in January 2011 with EF 31% with inferior scar.   Echo April 2011 showed EF 30-35%. Echo August 2013 showed EF 45-50% with inferior and inferoseptal hypokinesis  He returns for follow up today.  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  No exertional dyspnea or chest pain.  Able to do all needed activities without undue fatigue.  Uses Lasix rarely.  No orthopnea/PNA.   SH: Lives in [place], nonsmoker, works in a warehouse.  FH: No premature CAD  ROS: All systems negative except as listed in HPI, PMH and Problem List.  Past Medical History  Diagnosis Date  . Congestive heart failure (CHF)     secondary to ischemic cardiomyopathy with an ejection fraction of 25-30%  . Coronary artery disease   . Tachycardia 2010      Unexplained tachycardia during hospitalization  . History of hypertension   . Inguinal hernia 2005    History of left inguinal hernia, status post repair in 2005    Current Outpatient Prescriptions  Medication Sig Dispense Refill  . Ascorbic Acid (VITAMIN C) 1000 MG tablet Take 1,000 mg by mouth daily.      . Calcium Carbonate-Vitamin D (CALCIUM + D PO) Take 1 tablet by mouth 2 (two) times a week.      . carvedilol (COREG) 25 MG tablet TAKE 1 TABLET TWICE A DAY  180 tablet  3  . fluticasone (FLONASE) 50 MCG/ACT nasal spray Place 2 sprays into the nose daily.      . furosemide (LASIX) 20 MG tablet Take 1 tablet (20 mg total) by mouth daily as needed.  60 tablet  2  . levocetirizine (XYZAL) 5 MG tablet Take 5 mg by mouth every evening.      Marland Kitchen lisinopril (PRINIVIL,ZESTRIL) 20 MG tablet TAKE 1 TABLET TWICE A DAY  180 tablet  3  . Multiple Vitamins-Minerals (CENTRUM ULTRA MENS PO) Take 1 tablet by mouth 4  (four) times a week.      . potassium chloride SA (K-DUR,KLOR-CON) 20 MEQ tablet Take 1 tablet (20 mEq total) by mouth daily as needed.  60 tablet  2  . simvastatin (ZOCOR) 20 MG tablet TAKE 1 TABLET DAILY  90 tablet  3  . spironolactone (ALDACTONE) 25 MG tablet Take 1 tablet (25 mg total) by mouth daily.  90 tablet  3  . vitamin E 600 UNIT capsule Take 600 Units by mouth once a week.      Marland Kitchen aspirin EC 81 MG tablet Take 1 tablet (81 mg total) by mouth daily.  90 tablet  3   No current facility-administered medications for this encounter.     PHYSICAL EXAM: Filed Vitals:   06/11/13 0911  BP: 118/78  Pulse: 68  Weight: 172 lb 4 oz (78.132 kg)  SpO2: 100%    General:  Well appearing. No resp difficulty HEENT: normal Neck: supple. JVP flat. Carotids 2+ bilaterally; no bruits. No lymphadenopathy or thryomegaly appreciated. Cor: PMI normal. Regular rate & rhythm. No rubs, gallops or murmurs. Lungs: clear Abdomen: soft, nontender, nondistended. No hepatosplenomegaly. No bruits or masses. Good bowel sounds. Extremities: no cyanosis, clubbing, rash, edema Neuro: alert & orientedx3, cranial nerves grossly intact. Moves all 4 extremities  w/o difficulty. Affect pleasant.  ASSESSMENT & PLAN: 1. Chronic systolic CHF: Ischemic cardiomyopathy.  EF 45-50% on last echo in 8/13.  NYHA class I-II symptoms.  Overall doing quite well.  - Continue lisinopril, Coreg, and spironolactone at current doses as EF is still not normal.  - Needs BMET today.  Will arrange BMET at least every 4 months given spironolactone use, can do at PCP's office.  2. CAD: Occluded RCA on prior cath.  No chest pain.  Continue statin, will add ASA 81 mg daily.  3. Hyperlipidemia: On simvastatin, will check lipids today.   Marca Ancona 06/11/2013 1:08 PM

## 2013-06-11 NOTE — Patient Instructions (Addendum)
Labs today  Start Aspirin 81 mg daily  Labs every 4 months, we have given you an order to have these done at your Primary Care MD  We will contact you in 6 months to schedule your next appointment.

## 2013-06-27 ENCOUNTER — Telehealth (HOSPITAL_COMMUNITY): Payer: Self-pay | Admitting: Cardiology

## 2013-06-27 DIAGNOSIS — I2510 Atherosclerotic heart disease of native coronary artery without angina pectoris: Secondary | ICD-10-CM

## 2013-06-27 MED ORDER — SIMVASTATIN 40 MG PO TABS: ORAL_TABLET | ORAL | Status: DC

## 2013-06-27 NOTE — Telephone Encounter (Signed)
Med dose changes sent to pharmacy

## 2013-08-02 ENCOUNTER — Other Ambulatory Visit: Payer: Self-pay

## 2014-05-28 ENCOUNTER — Other Ambulatory Visit (HOSPITAL_COMMUNITY): Payer: Self-pay | Admitting: Cardiology

## 2014-07-04 ENCOUNTER — Other Ambulatory Visit: Payer: Self-pay | Admitting: Cardiology

## 2014-07-08 ENCOUNTER — Encounter (HOSPITAL_COMMUNITY): Payer: Self-pay | Admitting: Cardiology

## 2014-08-13 ENCOUNTER — Encounter (HOSPITAL_COMMUNITY): Payer: BC Managed Care – HMO

## 2014-08-13 ENCOUNTER — Other Ambulatory Visit (HOSPITAL_COMMUNITY): Payer: Self-pay | Admitting: Cardiology

## 2014-09-18 ENCOUNTER — Ambulatory Visit (HOSPITAL_COMMUNITY)
Admission: RE | Admit: 2014-09-18 | Discharge: 2014-09-18 | Disposition: A | Discharge status: Home / Self Care | Payer: BC Managed Care – HMO | Source: Ambulatory Visit | Attending: Internal Medicine | Admitting: Internal Medicine

## 2014-09-18 VITALS — BP 110/90 | HR 73 | Wt 174.4 lb

## 2014-09-18 DIAGNOSIS — I2510 Atherosclerotic heart disease of native coronary artery without angina pectoris: Secondary | ICD-10-CM | POA: Diagnosis not present

## 2014-09-18 DIAGNOSIS — I5022 Chronic systolic (congestive) heart failure: Secondary | ICD-10-CM | POA: Insufficient documentation

## 2014-09-18 DIAGNOSIS — Z79899 Other long term (current) drug therapy: Secondary | ICD-10-CM | POA: Insufficient documentation

## 2014-09-18 DIAGNOSIS — I10 Essential (primary) hypertension: Secondary | ICD-10-CM | POA: Diagnosis not present

## 2014-09-18 DIAGNOSIS — I255 Ischemic cardiomyopathy: Secondary | ICD-10-CM | POA: Diagnosis not present

## 2014-09-18 DIAGNOSIS — E785 Hyperlipidemia, unspecified: Secondary | ICD-10-CM | POA: Insufficient documentation

## 2014-09-18 DIAGNOSIS — E782 Mixed hyperlipidemia: Secondary | ICD-10-CM | POA: Insufficient documentation

## 2014-09-18 DIAGNOSIS — Z7982 Long term (current) use of aspirin: Secondary | ICD-10-CM | POA: Insufficient documentation

## 2014-09-18 LAB — COMPREHENSIVE METABOLIC PANEL
ALT: 30 U/L (ref 0–53)
AST: 32 U/L (ref 0–37)
Albumin: 4.2 g/dL (ref 3.5–5.2)
Alkaline Phosphatase: 34 U/L — ABNORMAL LOW (ref 39–117)
Anion gap: 4 — ABNORMAL LOW (ref 5–15)
BUN: 16 mg/dL (ref 6–23)
CO2: 29 mmol/L (ref 19–32)
Calcium: 9.5 mg/dL (ref 8.4–10.5)
Chloride: 106 mEq/L (ref 96–112)
Creatinine, Ser: 1.22 mg/dL (ref 0.50–1.35)
GFR calc Af Amer: 77 mL/min — ABNORMAL LOW (ref >90)
GFR calc non Af Amer: 67 mL/min — ABNORMAL LOW (ref >90)
Glucose, Bld: 101 mg/dL — ABNORMAL HIGH (ref 70–99)
Potassium: 4.4 mmol/L (ref 3.5–5.1)
Sodium: 139 mmol/L (ref 135–145)
Total Bilirubin: 0.5 mg/dL (ref 0.3–1.2)
Total Protein: 6.3 g/dL (ref 6.0–8.3)

## 2014-09-18 LAB — LIPID PANEL
Cholesterol: 154 mg/dL (ref 0–200)
HDL: 44 mg/dL (ref >39)
LDL Cholesterol: 100 mg/dL — ABNORMAL HIGH (ref 0–99)
Total CHOL/HDL Ratio: 3.5 RATIO
Triglycerides: 49 mg/dL (ref <150)
VLDL: 10 mg/dL (ref 0–40)

## 2014-09-18 NOTE — Patient Instructions (Signed)
Labs today  Your physician has requested that you have an echocardiogram. Echocardiography is a painless test that uses sound waves to create images of your heart. It provides your doctor with information about the size and shape of your heart and how well your heart's chambers and valves are working. This procedure takes approximately one hour. There are no restrictions for this procedure.  Your physician recommends that you schedule a follow-up appointment in: 3 months  Do the following things EVERYDAY: 1) Weigh yourself in the morning before breakfast. Write it down and keep it in a log. 2) Take your medicines as prescribed 3) Eat low salt foods-Limit salt (sodium) to 2000 mg per day.  4) Stay as active as you can everyday 5) Limit all fluids for the day to less than 2 liters 6)

## 2014-09-18 NOTE — Addendum Note (Signed)
Encounter addended by: Theresia Bough, CMA on: 09/18/2014 11:32 AM<BR>     Documentation filed: Dx Association, Patient Instructions Section, Orders

## 2014-09-18 NOTE — Progress Notes (Signed)
Patient ID: Gretchen PortelaShawn Goodwill, male   DOB: Jun 28, 1961, 53 y.o.   MRN: [id number] PCP: Dr. Riley NearingAguiar  HPI: [age] year gentlemen with history of HF due to mixed ischemic/no-ischemic CM (diagnosed 9/10) with EF 25-30% range. Cath with 1-v CAD with chronically occluded RCA. MRI in January 2011 with EF 31% with inferior scar.   Echo April 2011 showed EF 30-35%. Echo August 2013 showed EF 45-50% with inferior and inferoseptal hypokinesis  He returns for follow up today.  We have not seen him since 9/14.  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  No exertional dyspnea or chest pain. No edema. Weight stable.  Able to do all needed activities without problem.. Continues to take most medicines that he gets through [facility]. Does not need lasix. Not taking digoxin either. Is taking lisinopril, carvedilol, spiro and simvastatin.   Brother had heart transplant in [place] several months ago. Dad also had previous heart transplant.   SH: [place] in [place], nonsmoker, works in a warehouse.  FH: No premature CAD  ROS: All systems negative except as listed in HPI, PMH and Problem List.  Past Medical History  Diagnosis Date  . Congestive heart failure (CHF)     secondary to ischemic cardiomyopathy with an ejection fraction of 25-30%  . Coronary artery disease   . Tachycardia 2010      Unexplained tachycardia during hospitalization  . History of hypertension   . Inguinal hernia 2005    History of left inguinal hernia, status post repair in 2005    Current Outpatient Prescriptions  Medication Sig Dispense Refill  . Ascorbic Acid (VITAMIN C) 1000 MG tablet Take 1,000 mg by mouth daily.    Marland Kitchen. aspirin EC 81 MG tablet Take 1 tablet (81 mg total) by mouth daily. 90 tablet 3  . Calcium Carbonate-Vitamin D (CALCIUM + D PO) Take 1 tablet by mouth 2 (two) times a week.    . carvedilol (COREG) 25 MG tablet TAKE 1 TABLET TWICE A DAY 180 tablet 2  . fluticasone (FLONASE) 50 MCG/ACT nasal spray Place 2 sprays into  the nose daily.    Marland Kitchen. levocetirizine (XYZAL) 5 MG tablet Take 5 mg by mouth every evening.    Marland Kitchen. lisinopril (PRINIVIL,ZESTRIL) 20 MG tablet TAKE 1 TABLET TWICE A DAY 180 tablet 2  . Multiple Vitamins-Minerals (CENTRUM ULTRA MENS PO) Take 1 tablet by mouth 4 (four) times a week.    . simvastatin (ZOCOR) 40 MG tablet TAKE 1 TABLET DAILY 90 tablet 0  . spironolactone (ALDACTONE) 25 MG tablet TAKE 1 TABLET DAILY 90 tablet 2  . vitamin E 600 UNIT capsule Take 600 Units by mouth once a week.    . furosemide (LASIX) 20 MG tablet Take 1 tablet (20 mg total) by mouth daily as needed. (Patient not taking: Reported on 09/18/2014) 60 tablet 2  . potassium chloride SA (K-DUR,KLOR-CON) 20 MEQ tablet Take 1 tablet (20 mEq total) by mouth daily as needed. (Patient not taking: Reported on 09/18/2014) 60 tablet 2   No current facility-administered medications for this encounter.     PHYSICAL EXAM: Filed Vitals:   09/18/14 1050  BP: 110/90  Pulse: 73  Weight: 174 lb 6.4 oz (79.107 kg)  SpO2: 97%    General:  Well appearing. No resp difficulty HEENT: normal Neck: supple. JVP flat. Carotids 2+ bilaterally; no bruits. No lymphadenopathy or thryomegaly appreciated. Cor: PMI normal. Regular rate & rhythm. No rubs, gallops or murmurs. Lungs: clear Abdomen: soft, nontender,  nondistended. No hepatosplenomegaly. No bruits or masses. Good bowel sounds. Extremities: no cyanosis, clubbing, rash, edema Neuro: alert & orientedx3, cranial nerves grossly intact. Moves all 4 extremities w/o difficulty. Affect pleasant.  ASSESSMENT & PLAN: 1. Chronic systolic CHF: Suspect mixed ischemic/nonischmic cardiomyopathy with familial component  EF 45-50% on last echo in 8/13.  NYHA class I symptoms.  Overall doing quite well.  - Continue lisinopril, Coreg, and spironolactone at current doses as EF is still not normal.  - Needs BMET today for spiro. - Will check echo to reassess EF.  Discussed possibility of genetic testing. He  will talk to his family about what has been done.  2. CAD: Occluded RCA on prior cath.  No chest pain.  Continue statin and ASA 81 mg daily.  3. Hyperlipidemia: On simvastatin, will check lipids today.   Arvilla Meres MD 09/18/2014 11:16 AM

## 2014-09-25 ENCOUNTER — Other Ambulatory Visit (HOSPITAL_COMMUNITY): Payer: Self-pay

## 2014-09-25 MED ORDER — ATORVASTATIN CALCIUM 40 MG PO TABS: 40.0000 mg | ORAL_TABLET | Freq: Every day | ORAL | Status: DC

## 2014-10-01 ENCOUNTER — Ambulatory Visit (HOSPITAL_COMMUNITY): Payer: BC Managed Care – HMO

## 2015-02-15 ENCOUNTER — Other Ambulatory Visit (HOSPITAL_COMMUNITY): Payer: Self-pay | Admitting: Internal Medicine

## 2015-07-24 ENCOUNTER — Other Ambulatory Visit: Payer: Self-pay | Admitting: Internal Medicine

## 2015-07-26 ENCOUNTER — Other Ambulatory Visit (HOSPITAL_COMMUNITY): Payer: Self-pay | Admitting: Internal Medicine

## 2015-11-26 ENCOUNTER — Other Ambulatory Visit (HOSPITAL_COMMUNITY): Payer: Self-pay | Admitting: Internal Medicine

## 2016-04-03 ENCOUNTER — Other Ambulatory Visit: Payer: Self-pay | Admitting: Internal Medicine

## 2016-07-13 ENCOUNTER — Other Ambulatory Visit: Payer: Self-pay | Admitting: Internal Medicine

## 2016-12-03 ENCOUNTER — Other Ambulatory Visit: Payer: Self-pay | Admitting: Internal Medicine

## 2020-03-04 ENCOUNTER — Encounter (HOSPITAL_COMMUNITY): Payer: Self-pay

## 2020-03-04 ENCOUNTER — Other Ambulatory Visit: Payer: Self-pay

## 2020-03-04 ENCOUNTER — Emergency Department (HOSPITAL_COMMUNITY): Payer: BC Managed Care – PPO

## 2020-03-04 ENCOUNTER — Inpatient Hospital Stay (HOSPITAL_COMMUNITY)
Admission: EM | Admit: 2020-03-04 | Discharge: 2020-03-13 | DRG: 286 | Disposition: A | Discharge status: Home / Self Care | Payer: BC Managed Care – PPO | Attending: Internal Medicine | Admitting: Internal Medicine

## 2020-03-04 DIAGNOSIS — I2582 Chronic total occlusion of coronary artery: Secondary | ICD-10-CM | POA: Diagnosis present

## 2020-03-04 DIAGNOSIS — Z7982 Long term (current) use of aspirin: Secondary | ICD-10-CM

## 2020-03-04 DIAGNOSIS — I428 Other cardiomyopathies: Secondary | ICD-10-CM | POA: Diagnosis present

## 2020-03-04 DIAGNOSIS — I493 Ventricular premature depolarization: Secondary | ICD-10-CM | POA: Diagnosis not present

## 2020-03-04 DIAGNOSIS — T465X5A Adverse effect of other antihypertensive drugs, initial encounter: Secondary | ICD-10-CM | POA: Diagnosis present

## 2020-03-04 DIAGNOSIS — D72829 Elevated white blood cell count, unspecified: Secondary | ICD-10-CM

## 2020-03-04 DIAGNOSIS — E876 Hypokalemia: Secondary | ICD-10-CM | POA: Diagnosis present

## 2020-03-04 DIAGNOSIS — Z91013 Allergy to seafood: Secondary | ICD-10-CM

## 2020-03-04 DIAGNOSIS — N179 Acute kidney failure, unspecified: Secondary | ICD-10-CM | POA: Diagnosis present

## 2020-03-04 DIAGNOSIS — R570 Cardiogenic shock: Principal | ICD-10-CM | POA: Diagnosis present

## 2020-03-04 DIAGNOSIS — N183 Chronic kidney disease, stage 3 unspecified: Secondary | ICD-10-CM

## 2020-03-04 DIAGNOSIS — I110 Hypertensive heart disease with heart failure: Secondary | ICD-10-CM | POA: Diagnosis present

## 2020-03-04 DIAGNOSIS — I2510 Atherosclerotic heart disease of native coronary artery without angina pectoris: Secondary | ICD-10-CM | POA: Diagnosis present

## 2020-03-04 DIAGNOSIS — Z20822 Contact with and (suspected) exposure to covid-19: Secondary | ICD-10-CM | POA: Diagnosis present

## 2020-03-04 DIAGNOSIS — E785 Hyperlipidemia, unspecified: Secondary | ICD-10-CM | POA: Diagnosis present

## 2020-03-04 DIAGNOSIS — R069 Unspecified abnormalities of breathing: Secondary | ICD-10-CM

## 2020-03-04 DIAGNOSIS — Z79899 Other long term (current) drug therapy: Secondary | ICD-10-CM

## 2020-03-04 DIAGNOSIS — I959 Hypotension, unspecified: Secondary | ICD-10-CM

## 2020-03-04 DIAGNOSIS — Z9114 Patient's other noncompliance with medication regimen: Secondary | ICD-10-CM

## 2020-03-04 DIAGNOSIS — R0602 Shortness of breath: Secondary | ICD-10-CM

## 2020-03-04 DIAGNOSIS — I340 Nonrheumatic mitral (valve) insufficiency: Secondary | ICD-10-CM | POA: Diagnosis present

## 2020-03-04 DIAGNOSIS — Z8249 Family history of ischemic heart disease and other diseases of the circulatory system: Secondary | ICD-10-CM

## 2020-03-04 DIAGNOSIS — E872 Acidosis: Secondary | ICD-10-CM | POA: Diagnosis present

## 2020-03-04 DIAGNOSIS — E875 Hyperkalemia: Secondary | ICD-10-CM

## 2020-03-04 DIAGNOSIS — I5023 Acute on chronic systolic (congestive) heart failure: Secondary | ICD-10-CM | POA: Diagnosis present

## 2020-03-04 LAB — BASIC METABOLIC PANEL
Anion gap: 11 (ref 5–15)
BUN: 25 mg/dL — ABNORMAL HIGH (ref 6–20)
CO2: 17 mmol/L — ABNORMAL LOW (ref 22–32)
Calcium: 8.2 mg/dL — ABNORMAL LOW (ref 8.9–10.3)
Chloride: 104 mmol/L (ref 98–111)
Creatinine, Ser: 1.66 mg/dL — ABNORMAL HIGH (ref 0.61–1.24)
GFR calc Af Amer: 52 mL/min — ABNORMAL LOW (ref >60)
GFR calc non Af Amer: 45 mL/min — ABNORMAL LOW (ref >60)
Glucose, Bld: 154 mg/dL — ABNORMAL HIGH (ref 70–99)
Potassium: 6 mmol/L — ABNORMAL HIGH (ref 3.5–5.1)
Sodium: 132 mmol/L — ABNORMAL LOW (ref 135–145)

## 2020-03-04 LAB — CBC WITH DIFFERENTIAL/PLATELET
Abs Immature Granulocytes: 0.12 10*3/uL — ABNORMAL HIGH (ref 0.00–0.07)
Basophils Absolute: 0 10*3/uL (ref 0.0–0.1)
Basophils Relative: 0 %
Eosinophils Absolute: 0 10*3/uL (ref 0.0–0.5)
Eosinophils Relative: 0 %
HCT: 42.7 % (ref 39.0–52.0)
Hemoglobin: 13.6 g/dL (ref 13.0–17.0)
Immature Granulocytes: 1 %
Lymphocytes Relative: 10 %
Lymphs Abs: 1.7 10*3/uL (ref 0.7–4.0)
MCH: 27.8 pg (ref 26.0–34.0)
MCHC: 31.9 g/dL (ref 30.0–36.0)
MCV: 87.1 fL (ref 80.0–100.0)
Monocytes Absolute: 0.7 10*3/uL (ref 0.1–1.0)
Monocytes Relative: 5 %
Neutro Abs: 13.7 10*3/uL — ABNORMAL HIGH (ref 1.7–7.7)
Neutrophils Relative %: 84 %
Platelets: 197 10*3/uL (ref 150–400)
RBC: 4.9 MIL/uL (ref 4.22–5.81)
RDW: 14.7 % (ref 11.5–15.5)
WBC: 16.3 10*3/uL — ABNORMAL HIGH (ref 4.0–10.5)
nRBC: 0 % (ref 0.0–0.2)

## 2020-03-04 LAB — TROPONIN I (HIGH SENSITIVITY): Troponin I (High Sensitivity): 68 ng/L — ABNORMAL HIGH (ref <18)

## 2020-03-04 LAB — LACTIC ACID, PLASMA: Lactic Acid, Venous: 3.5 mmol/L (ref 0.5–1.9)

## 2020-03-04 MED ORDER — SODIUM CHLORIDE 0.9 % IV BOLUS
1000.0000 mL | Freq: Once | INTRAVENOUS | Status: AC
  Administered 2020-03-04: 1000 mL via INTRAVENOUS

## 2020-03-04 NOTE — ED Triage Notes (Signed)
Patient arrived [facility] from home for SOB, weakness, dizziness. On 3 L [place] sating at 98%. Hx CHF. Bolus NS 1000 given for BP of 78/50.

## 2020-03-04 NOTE — ED Provider Notes (Signed)
[facility] EMERGENCY DEPARTMENT Provider Note   CSN: [id number] Arrival date & time: 03/04/20  2201     History Chief Complaint  Patient presents with  . Shortness of Breath    Zachary Arias is a 59 y.o. male.  The history is provided by the patient and medical records.  Shortness of Breath  59 y.o. M with hx of CHF with last documented EF of 25-30% in 2015, CAD, HTN, presenting to the ED with SOB.  Patient states over the past few weeks SOB has been intermittent, lasting a few minutes here and there but then resolving.  States over the past few days he has been constantly SOB.  He does report some lightheadedness and feeling like he is going to pass out, especially with standing up.  He denies syncope or LOC.  He denies chest pain.  No fever but has had some cold chills.  He denies abdominal pain, nausea, vomiting, diarrhea.  He has been eating/drinking fine today.  He was previously followed by Dr. Haroldine Laws, however stopped going to his appointment several years ago.  He also took himself off of all of his home medications.  He states for the past few years he has mostly just been taking health supplements which he felt was working fine.  He did establish care with new cardiologist in [facility] yesterday, they restarted 3 of his medications: Carvedilol, spironolactone, lisinopril (unknown doses and medications were left in ambulance per patient).  Past Medical History:  Diagnosis Date  . Congestive heart failure (CHF) ([place])    secondary to ischemic cardiomyopathy with an ejection fraction of 25-30%  . Coronary artery disease   . History of hypertension   . Inguinal hernia 2005   History of left inguinal hernia, status post repair in 2005  . Tachycardia 2010     Unexplained tachycardia during hospitalization    Patient Active Problem List   Diagnosis Date Noted  . Hyperlipidemia LDL goal <70 09/18/2014  . CAD, NATIVE VESSEL 07/22/2009  . SYSTOLIC HEART  FAILURE, CHRONIC 07/22/2009  . HYPERTENSION 07/07/2009  . CAD 07/07/2009  . CHF 07/07/2009  . TACHYCARDIA 07/07/2009    Past Surgical History:  Procedure Laterality Date  . CARDIAC CATHETERIZATION   06/27/2009   ejection fraction of approximately 35%  . INGUINAL HERNIA REPAIR  01/27/2004   History of left inguinal hernia, status post repair in 2005       Family History  Problem Relation Age of Onset  . Cancer Mother        Ovarian Cancer  . Heart disease Father   . Heart attack Father   . Cardiomyopathy Brother     Social History   Tobacco Use  . Smoking status: Unknown If Ever Smoked  Substance Use Topics  . Alcohol use: Not on file  . Drug use: Not on file    Home Medications Prior to Admission medications   Medication Sig Start Date End Date Taking? Authorizing Provider  Ascorbic Acid (VITAMIN C) 1000 MG tablet Take 1,000 mg by mouth daily.    [provider]  aspirin EC 81 MG tablet Take 1 tablet (81 mg total) by mouth daily. 06/11/13   Larey Dresser, MD  atorvastatin (LIPITOR) 40 MG tablet TAKE 1 TABLET DAILY (REPLACES SIMVASTATIN) 07/13/16   Bensimhon, Shaune Pascal, MD  Calcium Carbonate-Vitamin D (CALCIUM + D PO) Take 1 tablet by mouth 2 (two) times a week.    [provider]  carvedilol (  COREG) 25 MG tablet TAKE 1 TABLET TWICE A DAY 11/26/15   Bensimhon, Shaune Pascal, MD  fluticasone (FLONASE) 50 MCG/ACT nasal spray Place 2 sprays into the nose daily.    [provider]  furosemide (LASIX) 20 MG tablet Take 1 tablet (20 mg total) by mouth daily as needed. Patient not taking: Reported on 09/18/2014 05/08/12   Wynetta Emery, PA-C  levocetirizine (XYZAL) 5 MG tablet Take 5 mg by mouth every evening.    [provider]  lisinopril (PRINIVIL,ZESTRIL) 20 MG tablet TAKE 1 TABLET TWICE A DAY 11/26/15   Bensimhon, Shaune Pascal, MD  Multiple Vitamins-Minerals (CENTRUM ULTRA MENS PO) Take 1 tablet by mouth 4 (four) times a week.    [provider]  potassium chloride SA (K-DUR,KLOR-CON) 20 MEQ tablet Take 1 tablet (20 mEq total) by mouth daily as needed. Patient not taking: Reported on 09/18/2014 05/08/12   Wynetta Emery, PA-C  spironolactone (ALDACTONE) 25 MG tablet Take 1 tablet (25 mg total) by mouth daily. 04/05/16   Bensimhon, Shaune Pascal, MD  vitamin E 600 UNIT capsule Take 600 Units by mouth once a week.    [provider]    Allergies    Shellfish allergy  Review of Systems   Review of Systems  Respiratory: Positive for shortness of breath.   Neurological: Positive for light-headedness.  All other systems reviewed and are negative.   Physical Exam Updated Vital Signs BP (!) 82/65   Pulse 75   Resp 17   Ht 5' 7"  (1.702 m)   Wt 73.5 kg   SpO2 97%   BMI 25.37 kg/m   Physical Exam Vitals and nursing note reviewed.  Constitutional:      Appearance: He is well-developed.     Comments: clammy  HENT:     Head: Normocephalic and atraumatic.  Eyes:     Conjunctiva/sclera: Conjunctivae normal.     Pupils: Pupils are equal, round, and reactive to light.  Cardiovascular:     Rate and Rhythm: Normal rate and regular rhythm.     Heart sounds: Normal heart sounds.  Pulmonary:     Effort: Pulmonary effort is normal.     Breath sounds: Normal breath sounds. No wheezing or rhonchi.     Comments: Does not appear overly SOB, lungs grossly clear, NAD, able to speak in sentences Abdominal:     General: Bowel sounds are normal.     Palpations: Abdomen is soft.  Musculoskeletal:        General: Normal range of motion.     Cervical back: Normal range of motion.     Comments: Trace edema of BLE  Skin:    General: Skin is warm and dry.  Neurological:     Mental Status: He is alert and oriented to person, place, and time.     Comments: Awake, alert, mentating well, moving arms and legs without issue     ED Results / Procedures / Treatments   Labs (all labs ordered are listed, but only abnormal  results are displayed) Labs Reviewed  CBC WITH DIFFERENTIAL/PLATELET - Abnormal; Notable for the following components:      Result Value   WBC 16.3 (*)    Neutro Abs 13.7 (*)    Abs Immature Granulocytes 0.12 (*)    All other components within normal limits  BASIC METABOLIC PANEL - Abnormal; Notable for the following components:   Sodium 132 (*)    Potassium 6.0 (*)    CO2  17 (*)    Glucose, Bld 154 (*)    BUN 25 (*)    Creatinine, Ser 1.66 (*)    Calcium 8.2 (*)    GFR calc non Af Amer 45 (*)    GFR calc Af Amer 52 (*)    All other components within normal limits  BRAIN NATRIURETIC PEPTIDE - Abnormal; Notable for the following components:   B Natriuretic Peptide 1,300.0 (*)    All other components within normal limits  LACTIC ACID, PLASMA - Abnormal; Notable for the following components:   Lactic Acid, Venous 3.5 (*)    All other components within normal limits  POTASSIUM - Abnormal; Notable for the following components:   Potassium 5.7 (*)    All other components within normal limits  LACTIC ACID, PLASMA - Abnormal; Notable for the following components:   Lactic Acid, Venous 2.1 (*)    All other components within normal limits  CBC - Abnormal; Notable for the following components:   WBC 13.9 (*)    Hemoglobin 12.6 (*)    HCT 38.8 (*)    All other components within normal limits  CREATININE, SERUM - Abnormal; Notable for the following components:   Creatinine, Ser 1.46 (*)    GFR calc non Af Amer 53 (*)    All other components within normal limits  TROPONIN I (HIGH SENSITIVITY) - Abnormal; Notable for the following components:   Troponin I (High Sensitivity) 68 (*)    All other components within normal limits  SARS CORONAVIRUS 2 BY RT PCR (HOSPITAL ORDER, [place])  CULTURE, BLOOD (ROUTINE X 2)  CULTURE, BLOOD (ROUTINE X 2)  URINE CULTURE  URINE CULTURE  TSH  URINALYSIS, ROUTINE W REFLEX MICROSCOPIC  HIV ANTIBODY (ROUTINE TESTING W  REFLEX)  URINALYSIS, COMPLETE (UACMP) WITH MICROSCOPIC    EKG EKG Interpretation  Date/Time:  Tuesday March 04 2020 22:09:48 EDT Ventricular Rate:  75 PR Interval:    QRS Duration: 83 QT Interval:  521 QTC Calculation: 582 R Axis:   72 Text Interpretation: Sinus rhythm Multiple ventricular premature complexes Biatrial enlargement Nonspecific T abnormalities, lateral leads Prolonged QT interval Confirmed by Thayer Jew [phone]) on 03/05/2020 12:35:35 AM   Radiology CT Angio Chest PE W and/or Wo Contrast  Result Date: 03/05/2020 CLINICAL DATA:  Shortness of breath, hypotension EXAM: CT ANGIOGRAPHY CHEST WITH CONTRAST TECHNIQUE: Multidetector CT imaging of the chest was performed using the standard protocol during bolus administration of intravenous contrast. Multiplanar CT image reconstructions and MIPs were obtained to evaluate the vascular anatomy. CONTRAST:  66m OMNIPAQUE IOHEXOL 350 MG/ML SOLN COMPARISON:  Chest radiograph 03/04/2020 min FINDINGS: Cardiovascular: Satisfactory opacification the pulmonary arteries however evaluation is limited due to respiratory motion artifact which may limit detection of smaller segmental and subsegmental pulmonary artery embolized. Some artifactual hypoattenuation is seen within the posterior segmental arteries of both lower lobes given the symmetric appearance on coronal met. No convincing central or lobar pulmonary artery filling defects are identified. Central pulmonary arteries are normal caliber. There is mild cardiomegaly with biatrial enlargement. Reflux of contrast is seen into the hepatic veins and IVC. Coronary artery calcifications are noted. No pericardial effusion. Suboptimal opacification of the aorta for luminal evaluation. Atherosclerotic plaque within the normal caliber aorta. Shared origin of brachiocephalic and left common carotid artery. Minimal plaque in the proximal great vessels. Mediastinum/Nodes: No mediastinal fluid or gas. Normal  thyroid gland and thoracic inlet. No acute abnormality of the trachea or esophagus. No worrisome  mediastinal, hilar or axillary adenopathy. Few low-attenuation, subcentimeter nodes may be reactive or edematous. Lungs/Pleura: Small moderate bilateral pleural effusions, right greater than left. Some adjacent atelectatic changes. Extensive interlobular septal thickening and fissural thickening throughout the lungs with pulmonary vascular redistribution and central pulmonary venous congestion with peribronchovascular thickening. More hazy ground-glass opacities are present in the dependent portions of the lungs and predominately within the lower lobes, which could reflect cardiogenic edema though atypical infection could have a similar appearance. No focal consolidative opacity. No pneumothorax. Upper Abdomen: No acute abnormalities present in the visualized portions of the upper abdomen. Musculoskeletal: Multilevel degenerative changes are present in the imaged portions of the spine. No acute osseous abnormality or suspicious osseous lesion. No worrisome chest wall lesions. Review of the MIP images confirms the above findings. IMPRESSION: 1. Satisfactory opacification the pulmonary arteries however evaluation is limited due to respiratory motion artifact which may limit detection of smaller segmental and subsegmental pulmonary artery embolized. No convincing central or lobar pulmonary artery filling defects are identified to suggest pulmonary embolism. 2. Cardiomegaly with biatrial enlargement. Reflux of contrast into the hepatic veins and IVC may suggest elevated right heart pressures. 3. Small moderate bilateral pleural effusions, right greater than left, with adjacent atelectatic changes. 4. Septal thickening and vascular congestion with hazy ground-glass opacities in the dependent portions of the lungs, predominately within the lower lobes, favored to wreck deflect features of cardiogenic pulmonary edema. 5. Aortic  Atherosclerosis (ICD10-I70.0). Electronically Signed   By: Lovena Le M.D.   On: 03/05/2020 01:34   DG Chest Port 1 View  Result Date: 03/04/2020 CLINICAL DATA:  Short of breath, hypotension, weakness, EXAM: PORTABLE CHEST 1 VIEW COMPARISON:  06/24/2009 FINDINGS: Single frontal view of the chest demonstrates an enlarged cardiac silhouette. There is chronic right pleural thickening. No airspace disease, effusion, or pneumothorax. IMPRESSION: 1. Enlarged cardiac silhouette. 2. No acute intrathoracic process. Electronically Signed   By: Randa Ngo M.D.   On: 03/04/2020 22:41    Procedures Procedures (including critical care time)  CRITICAL CARE Performed by: Larene Pickett   Total critical care time: 45 minutes  Critical care time was exclusive of separately billable procedures and treating other patients.  Critical care was necessary to treat or prevent imminent or life-threatening deterioration.  Critical care was time spent personally by me on the following activities: development of treatment plan with patient and/or surrogate as well as nursing, discussions with consultants, evaluation of patient's response to treatment, examination of patient, obtaining history from patient or surrogate, ordering and performing treatments and interventions, ordering and review of laboratory studies, ordering and review of radiographic studies, pulse oximetry and re-evaluation of patient's condition.   Medications Ordered in ED Medications - No data to display  ED Course  I have reviewed the triage vital signs and the nursing notes.  Pertinent labs & imaging results that were available during my care of the patient were reviewed by me and considered in my medical decision making (see chart for details).    MDM Rules/Calculators/A&P    59 year old male presenting to the ED with shortness of breath.  States he has been having issues over the past few weeks, worse over the past 24 hours.  Does  have history of CHF.  Denies any significant weight change or lower extremity edema.  He did see new cardiologist yesterday which reinstated his carvedilol, lisinopril, and spironolactone.  He had been off these medications for several years now.  Patient is hypotensive on arrival  in the 24N systolic after 1 L of fluids with EMS.  He is awake, alert, mentating appropriately.  He does report some lightheadedness with standing today but no syncope or loss of consciousness.  He denies any chest pain.  Patient's rectal temperature is 95.91F.  Bair hugger applied.  He has no real infectious symptoms other than occasional cough.  Symptoms possibly related to sepsis vs hypotension from abrupt re-initiation of his medications.  Labs pending including lactate, blood and urine cultures.  CXR without acute pulmonary edema or other signs of fluid overload so will give second liter and reassess.    12:11 AM After nearly 2L of fluids, pressure still 80/64.  Patient states he feels about the same.  Lactic elevated 3.5.  Code sepsis initiated as he is meeting criteria (hypotension, elevated lactic, hypothermia, leukocytosis).  Unknown possible source at this time so will give broad spectrum antibiotics.    12:28 AM Patient has completed 30cc/kg bolus, remains hypotensive back in to the 99'V systolic.  Will start levophed.  Will also obtain CTA-- possible PE vs occult pneumonia vs fluid overload.  CTA negative for PE.  Does have some findings of pulmonary edema.  No further fluids given.  SBP finally into the 90's on levophed.  MAP improving.  Will consult ICU for admission.  Discussed with Dr. Oletta Darter-- will have ground team admit.  Final Clinical Impression(s) / ED Diagnoses Final diagnoses:  Hypotension, unspecified hypotension type  SOB (shortness of breath)    Rx / DC Orders ED Discharge Orders    None       Larene Pickett, PA-C 03/05/20 0531    Merryl Hacker, MD 03/05/20 [id number]

## 2020-03-05 ENCOUNTER — Inpatient Hospital Stay (HOSPITAL_COMMUNITY): Payer: BC Managed Care – PPO

## 2020-03-05 ENCOUNTER — Emergency Department (HOSPITAL_COMMUNITY): Payer: BC Managed Care – PPO

## 2020-03-05 ENCOUNTER — Encounter (HOSPITAL_COMMUNITY): Payer: Self-pay | Admitting: Pulmonary Disease

## 2020-03-05 ENCOUNTER — Inpatient Hospital Stay: Payer: Self-pay

## 2020-03-05 DIAGNOSIS — I2510 Atherosclerotic heart disease of native coronary artery without angina pectoris: Secondary | ICD-10-CM | POA: Diagnosis present

## 2020-03-05 DIAGNOSIS — I428 Other cardiomyopathies: Secondary | ICD-10-CM | POA: Diagnosis present

## 2020-03-05 DIAGNOSIS — I110 Hypertensive heart disease with heart failure: Secondary | ICD-10-CM | POA: Diagnosis present

## 2020-03-05 DIAGNOSIS — Z91013 Allergy to seafood: Secondary | ICD-10-CM | POA: Diagnosis not present

## 2020-03-05 DIAGNOSIS — T465X5A Adverse effect of other antihypertensive drugs, initial encounter: Secondary | ICD-10-CM | POA: Diagnosis present

## 2020-03-05 DIAGNOSIS — N183 Chronic kidney disease, stage 3 unspecified: Secondary | ICD-10-CM

## 2020-03-05 DIAGNOSIS — Z8249 Family history of ischemic heart disease and other diseases of the circulatory system: Secondary | ICD-10-CM | POA: Diagnosis not present

## 2020-03-05 DIAGNOSIS — D72829 Elevated white blood cell count, unspecified: Secondary | ICD-10-CM

## 2020-03-05 DIAGNOSIS — Z7982 Long term (current) use of aspirin: Secondary | ICD-10-CM | POA: Diagnosis not present

## 2020-03-05 DIAGNOSIS — R570 Cardiogenic shock: Secondary | ICD-10-CM | POA: Diagnosis present

## 2020-03-05 DIAGNOSIS — I493 Ventricular premature depolarization: Secondary | ICD-10-CM | POA: Diagnosis not present

## 2020-03-05 DIAGNOSIS — E785 Hyperlipidemia, unspecified: Secondary | ICD-10-CM | POA: Diagnosis present

## 2020-03-05 DIAGNOSIS — I5023 Acute on chronic systolic (congestive) heart failure: Secondary | ICD-10-CM | POA: Diagnosis present

## 2020-03-05 DIAGNOSIS — I340 Nonrheumatic mitral (valve) insufficiency: Secondary | ICD-10-CM | POA: Diagnosis present

## 2020-03-05 DIAGNOSIS — E875 Hyperkalemia: Secondary | ICD-10-CM

## 2020-03-05 DIAGNOSIS — I959 Hypotension, unspecified: Secondary | ICD-10-CM

## 2020-03-05 DIAGNOSIS — I2582 Chronic total occlusion of coronary artery: Secondary | ICD-10-CM | POA: Diagnosis present

## 2020-03-05 DIAGNOSIS — J90 Pleural effusion, not elsewhere classified: Secondary | ICD-10-CM

## 2020-03-05 DIAGNOSIS — E872 Acidosis: Secondary | ICD-10-CM | POA: Diagnosis present

## 2020-03-05 DIAGNOSIS — N179 Acute kidney failure, unspecified: Secondary | ICD-10-CM | POA: Diagnosis present

## 2020-03-05 DIAGNOSIS — E876 Hypokalemia: Secondary | ICD-10-CM | POA: Diagnosis present

## 2020-03-05 DIAGNOSIS — Z79899 Other long term (current) drug therapy: Secondary | ICD-10-CM | POA: Diagnosis not present

## 2020-03-05 DIAGNOSIS — Z9114 Patient's other noncompliance with medication regimen: Secondary | ICD-10-CM | POA: Diagnosis not present

## 2020-03-05 DIAGNOSIS — Z20822 Contact with and (suspected) exposure to covid-19: Secondary | ICD-10-CM | POA: Diagnosis present

## 2020-03-05 LAB — BRAIN NATRIURETIC PEPTIDE: B Natriuretic Peptide: 1300 pg/mL — ABNORMAL HIGH (ref 0.0–100.0)

## 2020-03-05 LAB — POTASSIUM: Potassium: 5.7 mmol/L — ABNORMAL HIGH (ref 3.5–5.1)

## 2020-03-05 LAB — CBC
HCT: 38.8 % — ABNORMAL LOW (ref 39.0–52.0)
Hemoglobin: 12.6 g/dL — ABNORMAL LOW (ref 13.0–17.0)
MCH: 28.3 pg (ref 26.0–34.0)
MCHC: 32.5 g/dL (ref 30.0–36.0)
MCV: 87.2 fL (ref 80.0–100.0)
Platelets: 206 10*3/uL (ref 150–400)
RBC: 4.45 MIL/uL (ref 4.22–5.81)
RDW: 14.6 % (ref 11.5–15.5)
WBC: 13.9 10*3/uL — ABNORMAL HIGH (ref 4.0–10.5)
nRBC: 0 % (ref 0.0–0.2)

## 2020-03-05 LAB — URINALYSIS, COMPLETE (UACMP) WITH MICROSCOPIC
Bilirubin Urine: NEGATIVE
Glucose, UA: NEGATIVE mg/dL
Ketones, ur: NEGATIVE mg/dL
Leukocytes,Ua: NEGATIVE
Nitrite: NEGATIVE
Protein, ur: 100 mg/dL — AB
RBC / HPF: NONE SEEN RBC/hpf (ref 0–5)
Specific Gravity, Urine: 1.025 (ref 1.005–1.030)
pH: 5.5 (ref 5.0–8.0)

## 2020-03-05 LAB — RAPID URINE DRUG SCREEN, HOSP PERFORMED
Amphetamines: NOT DETECTED
Barbiturates: NOT DETECTED
Benzodiazepines: NOT DETECTED
Cocaine: NOT DETECTED
Opiates: NOT DETECTED
Tetrahydrocannabinol: NOT DETECTED

## 2020-03-05 LAB — CREATININE, SERUM
Creatinine, Ser: 1.46 mg/dL — ABNORMAL HIGH (ref 0.61–1.24)
GFR calc Af Amer: >60 mL/min (ref >60)
GFR calc non Af Amer: 53 mL/min — ABNORMAL LOW (ref >60)

## 2020-03-05 LAB — ECHOCARDIOGRAM COMPLETE
Height: 67 in
Weight: 2592 oz

## 2020-03-05 LAB — LACTIC ACID, PLASMA: Lactic Acid, Venous: 2.1 mmol/L (ref 0.5–1.9)

## 2020-03-05 LAB — PROCALCITONIN: Procalcitonin: <0.1 ng/mL

## 2020-03-05 LAB — MRSA PCR SCREENING: MRSA by PCR: NEGATIVE

## 2020-03-05 LAB — HIV ANTIBODY (ROUTINE TESTING W REFLEX): HIV Screen 4th Generation wRfx: NONREACTIVE

## 2020-03-05 LAB — SARS CORONAVIRUS 2 BY RT PCR (HOSPITAL ORDER, PERFORMED IN ~~LOC~~ HOSPITAL LAB): SARS Coronavirus 2: NEGATIVE

## 2020-03-05 LAB — MAGNESIUM: Magnesium: 1.8 mg/dL (ref 1.7–2.4)

## 2020-03-05 LAB — TSH: TSH: 2.396 u[IU]/mL (ref 0.350–4.500)

## 2020-03-05 MED ORDER — DOCUSATE SODIUM 100 MG PO CAPS
100.0000 mg | ORAL_CAPSULE | Freq: Two times a day (BID) | ORAL | Status: DC | PRN
  Administered 2020-03-06: 100 mg via ORAL
  Filled 2020-03-05 (×2): qty 1

## 2020-03-05 MED ORDER — IOHEXOL 350 MG/ML SOLN
75.0000 mL | Freq: Once | INTRAVENOUS | Status: AC | PRN
  Administered 2020-03-05: 75 mL via INTRAVENOUS

## 2020-03-05 MED ORDER — NOREPINEPHRINE 4 MG/250ML-% IV SOLN
0.0000 ug/min | INTRAVENOUS | Status: DC
  Administered 2020-03-05: 2 ug/min via INTRAVENOUS
  Filled 2020-03-05: qty 250

## 2020-03-05 MED ORDER — METRONIDAZOLE IN NACL 5-0.79 MG/ML-% IV SOLN
500.0000 mg | Freq: Once | INTRAVENOUS | Status: AC
  Administered 2020-03-05: 500 mg via INTRAVENOUS
  Filled 2020-03-05: qty 100

## 2020-03-05 MED ORDER — SODIUM CHLORIDE 0.9 % IV SOLN: 250.0000 mL | INTRAVENOUS | Status: DC

## 2020-03-05 MED ORDER — ASPIRIN EC 325 MG PO TBEC
325.0000 mg | DELAYED_RELEASE_TABLET | Freq: Every day | ORAL | Status: DC
  Administered 2020-03-05: 325 mg via ORAL
  Filled 2020-03-05: qty 1

## 2020-03-05 MED ORDER — ASPIRIN EC 81 MG PO TBEC
81.0000 mg | DELAYED_RELEASE_TABLET | Freq: Every day | ORAL | Status: DC
  Administered 2020-03-06 – 2020-03-13 (×7): 81 mg via ORAL
  Filled 2020-03-05 (×8): qty 1

## 2020-03-05 MED ORDER — POLYETHYLENE GLYCOL 3350 17 G PO PACK: 17.0000 g | PACK | Freq: Every day | ORAL | Status: DC | PRN

## 2020-03-05 MED ORDER — MAGNESIUM SULFATE 2 GM/50ML IV SOLN
2.0000 g | Freq: Once | INTRAVENOUS | Status: AC
  Administered 2020-03-05: 2 g via INTRAVENOUS
  Filled 2020-03-05: qty 50

## 2020-03-05 MED ORDER — CHLORHEXIDINE GLUCONATE CLOTH 2 % EX PADS
6.0000 | MEDICATED_PAD | Freq: Every day | CUTANEOUS | Status: DC
  Administered 2020-03-05 – 2020-03-12 (×7): 6 via TOPICAL

## 2020-03-05 MED ORDER — SODIUM CHLORIDE 0.9% FLUSH: 10.0000 mL | INTRAVENOUS | Status: DC | PRN

## 2020-03-05 MED ORDER — SODIUM CHLORIDE 0.9 % IV SOLN
2.0000 g | Freq: Once | INTRAVENOUS | Status: AC
  Administered 2020-03-05: 2 g via INTRAVENOUS
  Filled 2020-03-05: qty 2

## 2020-03-05 MED ORDER — FUROSEMIDE 10 MG/ML IJ SOLN
80.0000 mg | Freq: Once | INTRAMUSCULAR | Status: AC
  Administered 2020-03-05: 80 mg via INTRAVENOUS
  Filled 2020-03-05: qty 8

## 2020-03-05 MED ORDER — SODIUM CHLORIDE 0.9 % IV SOLN
2.0000 g | Freq: Two times a day (BID) | INTRAVENOUS | Status: DC
  Administered 2020-03-05 – 2020-03-06 (×3): 2 g via INTRAVENOUS
  Filled 2020-03-05 (×5): qty 2

## 2020-03-05 MED ORDER — ENOXAPARIN SODIUM 30 MG/0.3ML ~~LOC~~ SOLN
30.0000 mg | SUBCUTANEOUS | Status: DC
  Administered 2020-03-05: 30 mg via SUBCUTANEOUS
  Filled 2020-03-05: qty 0.3

## 2020-03-05 MED ORDER — PERFLUTREN LIPID MICROSPHERE
1.0000 mL | INTRAVENOUS | Status: AC | PRN
  Administered 2020-03-05: 2 mL via INTRAVENOUS
  Filled 2020-03-05: qty 10

## 2020-03-05 MED ORDER — VANCOMYCIN HCL 1250 MG/250ML IV SOLN
1250.0000 mg | INTRAVENOUS | Status: DC
  Administered 2020-03-05: 1250 mg via INTRAVENOUS
  Filled 2020-03-05 (×2): qty 250

## 2020-03-05 MED ORDER — VANCOMYCIN HCL IN DEXTROSE 1-5 GM/200ML-% IV SOLN
1000.0000 mg | Freq: Once | INTRAVENOUS | Status: AC
  Administered 2020-03-05: 1000 mg via INTRAVENOUS
  Filled 2020-03-05: qty 200

## 2020-03-05 MED ORDER — SODIUM CHLORIDE 0.9% FLUSH
10.0000 mL | Freq: Two times a day (BID) | INTRAVENOUS | Status: DC
  Administered 2020-03-05 – 2020-03-11 (×10): 10 mL
  Administered 2020-03-11: 20 mL
  Administered 2020-03-12 – 2020-03-13 (×3): 10 mL

## 2020-03-05 MED ORDER — NOREPINEPHRINE 4 MG/250ML-% IV SOLN
2.0000 ug/min | INTRAVENOUS | Status: DC
  Administered 2020-03-05: 4 ug/min via INTRAVENOUS
  Administered 2020-03-06 (×2): 11 ug/min via INTRAVENOUS
  Administered 2020-03-07: 8 ug/min via INTRAVENOUS
  Administered 2020-03-07 (×2): 11 ug/min via INTRAVENOUS
  Filled 2020-03-05 (×9): qty 250

## 2020-03-05 MED ORDER — ENOXAPARIN SODIUM 40 MG/0.4ML ~~LOC~~ SOLN
40.0000 mg | SUBCUTANEOUS | Status: DC
  Administered 2020-03-06 – 2020-03-13 (×6): 40 mg via SUBCUTANEOUS
  Filled 2020-03-05 (×6): qty 0.4

## 2020-03-05 NOTE — Consult Note (Addendum)
Advanced Heart Failure Team Consult Note   Primary Physician: Zachary Arias, No Pcp Per PCP-Cardiologist:  No primary care provider on file.  HF MD: Dr Haroldine Laws  Reason for Consultation: Cardiogenic Shock   HPI:    Zachary Arias is seen today for evaluation of cardiogenic shock  at the request of Dr Elwyn Reach.   Zachary Arias is a 59 year old with a history of ischemic/non ischemic CM  and CAD 1 vessel chronically occluded RCA. Strong family history of CM with brother & father had heart transplant.    In the past he was followed in the HF clinic but has not been seen since 2015. Says he had started taking supplements and felt ok for several years. Over the last month he felt like he had cold but never saw a medical provider. He continued to work full time in a [employer].  On 6/7 he had am appointment with a cardiologist in [facility] because he was not feeling well. Says he was short of breath and weak.  He was started on carvedilol 25 mg twice a day, lisinopril 10 mg , and spironolactone 25 mg daily. Soon after starting he felt terrible and felt like he was going to pass out.    EMS was called. On arrival he hypotensive with SBP in 70-80s. CXR enlarged cardiac silhouette. CTA negative for PE. CCM consulted for admit. WBC elevated. Bld cultures obtained. Started cefepime + vanc. Placed on norep drip.  Pertinent labs: WBC 16, Hgb 13, Creatinine 1.66, K 6, sodium 132, CO2 15, BNP 1300  Lactic Acid: 3.5>2.1.    Bld CX : pending  Echo 2013- EF 40-45% Echo 2011 EF 30-35%    Review of Systems: [y] = yes, _0  = no   . General: Weight gain _1 ; Weight loss _2 ; Anorexia _3 ; Fatigue [Y ]; Fever _4 ; Chills _5 ; Weakness [Y ]  . Cardiac: Chest pain/pressure _6 ; Resting SOB _7 ; Exertional SOB [Y ]; Orthopnea _8 ; Pedal Edema _9 ; Palpitations _10 ; Syncope _11 ; Presyncope _12 ; Paroxysmal nocturnal dyspnea_13   . Pulmonary: Cough _14 ; Wheezing_15 ; Hemoptysis_16 ; Sputum _17 ; Snoring _18   . GI:  Vomiting_19 ; Dysphagia_20 ; Melena_21 ; Hematochezia _22 ; Heartburn_23 ; Abdominal pain _24 ; Constipation _25 ; Diarrhea _26 ; BRBPR _27   . GU: Hematuria_28 ; Dysuria _29 ; Nocturia_30   . Vascular: Pain in legs with walking _31 ; Pain in feet with lying flat _32 ; Non-healing sores _33 ; Stroke _34 ; TIA _35 ; Slurred speech _36 ;  . Neuro: Headaches_37 ; Vertigo_38 ; Seizures_39 ; Paresthesias_40 ;Blurred vision _41 ; Diplopia _42 ; Vision changes _43   . Ortho/Skin: Arthritis _44 ; Joint pain [Y ]; Muscle pain _45 ; Joint swelling _46 ; Back Pain [Y ]; Rash _47   . Psych: Depression_48 ; Anxiety_49   . Heme: Bleeding problems _50 ; Clotting disorders _51 ; Anemia _52   . Endocrine: Diabetes _53 ; Thyroid dysfunction_54   Home Medications Prior to Admission medications   Medication Sig Start Date End Date Taking? Authorizing Provider  Ascorbic Acid (VITAMIN C) 1000 MG tablet Take 1,000 mg by mouth daily.   Yes [provider]  carvedilol (COREG) 25 MG tablet TAKE 1 TABLET TWICE A DAY Zachary Arias taking differently: Take 25 mg by mouth 2 (two) times daily with a meal.  11/26/15  Yes Amarion Portell, Shaune Pascal, MD  Cholecalciferol ([employer] VITAMIN D3) 250 MCG (10000 UT) CAPS Take 1 capsule by mouth 2 (two) times a week.   Yes [provider]  lisinopril (ZESTRIL) 10 MG tablet Take 10 mg by mouth daily. 03/03/20  Yes [provider]  Multiple Vitamins-Minerals (CENTRUM ULTRA MENS PO) Take 1 tablet by mouth daily.    Yes [provider]  spironolactone (ALDACTONE) 25 MG tablet Take 1 tablet (25 mg total) by mouth daily. 04/05/16  Yes Yobani Schertzer, Shaune Pascal, MD  vitamin E 600 UNIT capsule Take 600 Units by mouth once a week.   Yes [provider]  aspirin EC 81 MG tablet Take 1 tablet (81 mg total) by mouth daily. Zachary Arias not taking: Reported on 03/04/2020 06/11/13   Larey Dresser, MD  atorvastatin (LIPITOR) 40 MG tablet TAKE 1 TABLET DAILY (REPLACES SIMVASTATIN) Zachary Arias not taking: Reported on 03/04/2020  07/13/16   Chyna Kneece, Shaune Pascal, MD  furosemide (LASIX) 20 MG tablet Take 1 tablet (20 mg total) by mouth daily as needed. Zachary Arias not taking: Reported on 09/18/2014 05/08/12   Wynetta Emery, PA-C  lisinopril (PRINIVIL,ZESTRIL) 20 MG tablet TAKE 1 TABLET TWICE A DAY Zachary Arias not taking: No sig reported 11/26/15   Maxamilian Amadon, Shaune Pascal, MD  potassium chloride SA (K-DUR,KLOR-CON) 20 MEQ tablet Take 1 tablet (20 mEq total) by mouth daily as needed. Zachary Arias not taking: Reported on 09/18/2014 05/08/12   Rito Ehrlich    Past Medical History: Past Medical History:  Diagnosis Date  . Congestive heart failure (CHF) ([place])    secondary to ischemic cardiomyopathy with an ejection fraction of 25-30%  . Coronary artery disease   . History of hypertension   . Inguinal hernia 2005   History of left inguinal hernia, status post repair in 2005  . Tachycardia 2010     Unexplained tachycardia during hospitalization    Past Surgical History: Past Surgical History:  Procedure Laterality Date  . CARDIAC CATHETERIZATION   06/27/2009   ejection fraction of approximately 35%  . INGUINAL HERNIA REPAIR  01/27/2004   History of left inguinal hernia, status post repair in 2005    Family History: Family History  Problem Relation Age of Onset  . Cancer Mother        Ovarian Cancer  . Heart disease Father   . Heart attack Father   . Cardiomyopathy Brother     Social History: Social History   Socioeconomic History  . Marital status: Single    Spouse name: Not on file  . Number of children: Not on file  . Years of education: Not on file  . Highest education level: Not on file  Occupational History  . Not on file  Tobacco Use  . Smoking status: Unknown If Ever Smoked  Substance and Sexual Activity  . Alcohol use: Not on file  . Drug use: Not on file  . Sexual activity: Not on file  Other Topics Concern  . Not on file  Social History Narrative  . Not on file   Social Determinants of  Health   Financial Resource Strain:   . Difficulty of Paying Living Expenses:   Food Insecurity:   . Worried About Charity fundraiser in the Last Year:   . Arboriculturist in the Last Year:   Transportation Needs:   . Film/video editor (Medical):   Marland Kitchen Lack of Transportation (Non-Medical):   Physical Activity:   . Days of Exercise per Week:   .  Minutes of Exercise per Session:   Stress:   . Feeling of Stress :   Social Connections:   . Frequency of Communication with Friends and Family:   . Frequency of Social Gatherings with Friends and Family:   . Attends Religious Services:   . Active Member of Clubs or Organizations:   . Attends Archivist Meetings:   Marland Kitchen Marital Status:     Allergies:  Allergies  Allergen Reactions  . Avocado Nausea Only  . Shellfish Allergy Nausea And Vomiting    Objective:    Vital Signs:   Temp:  [95.8 F (35.4 C)-97.6 F (36.4 C)] 97.6 F (36.4 C) (06/09 0118) Pulse Rate:  [75-101] 98 (06/09 1000) Resp:  [11-28] 21 (06/09 1000) BP: (73-115)/(61-85) 109/80 (06/09 1000) SpO2:  [95 %-100 %] 99 % (06/09 1000) Weight:  [73.5 kg] 73.5 kg (06/08 2209)    Weight change: Filed Weights   03/04/20 2209  Weight: 73.5 kg    Intake/Output:   Intake/Output Summary (Last 24 hours) at 03/05/2020 1043 Last data filed at 03/05/2020 0555 Gross per 24 hour  Intake --  Output 250 ml  Net -250 ml      Physical Exam    General:  No resp difficulty HEENT: normal Neck: supple. JVP to jaw . Carotids 2+ bilat; no bruits. No lymphadenopathy or thyromegaly appreciated. Cor: PMI nondisplaced. Regular rate & rhythm. No rubs, or murmurs. +S3 Lungs: clear decreased in the bases.  Abdomen: soft, nontender, nondistended. No hepatosplenomegaly. No bruits or masses. Good bowel sounds. Extremities: no cyanosis, clubbing, rash, edema Neuro: alert & orientedx3, cranial nerves grossly intact. moves all 4 extremities w/o difficulty. Affect  pleasant   Telemetry   SR with PVCs   EKG   SR PVCs 75 bpm    Labs   Basic Metabolic Panel: Recent Labs  Lab 03/04/20 2224 03/04/20 2340 03/05/20 0242  NA 132*  --   --   K 6.0* 5.7*  --   CL 104  --   --   CO2 17*  --   --   GLUCOSE 154*  --   --   BUN 25*  --   --   CREATININE 1.66*  --  1.46*  CALCIUM 8.2*  --   --     Liver Function Tests: No results for input(s): AST, ALT, ALKPHOS, BILITOT, PROT, ALBUMIN in the last 168 hours. No results for input(s): LIPASE, AMYLASE in the last 168 hours. No results for input(s): AMMONIA in the last 168 hours.  CBC: Recent Labs  Lab 03/04/20 2224 03/05/20 0242  WBC 16.3* 13.9*  NEUTROABS 13.7*  --   HGB 13.6 12.6*  HCT 42.7 38.8*  MCV 87.1 87.2  PLT 197 206    Cardiac Enzymes: No results for input(s): CKTOTAL, CKMB, CKMBINDEX, TROPONINI in the last 168 hours.  BNP: BNP (last 3 results) Recent Labs    03/04/20 2224  BNP 1,300.0*    ProBNP (last 3 results) No results for input(s): PROBNP in the last 8760 hours.   CBG: No results for input(s): GLUCAP in the last 168 hours.  Coagulation Studies: No results for input(s): LABPROT, INR in the last 72 hours.   Imaging   CT Angio Chest PE W and/or Wo Contrast  Result Date: 03/05/2020 CLINICAL DATA:  Shortness of breath, hypotension EXAM: CT ANGIOGRAPHY CHEST WITH CONTRAST TECHNIQUE: Multidetector CT imaging of the chest was performed using the standard protocol during bolus administration of intravenous contrast. Multiplanar CT  image reconstructions and MIPs were obtained to evaluate the vascular anatomy. CONTRAST:  62m OMNIPAQUE IOHEXOL 350 MG/ML SOLN COMPARISON:  Chest radiograph 03/04/2020 min FINDINGS: Cardiovascular: Satisfactory opacification the pulmonary arteries however evaluation is limited due to respiratory motion artifact which may limit detection of smaller segmental and subsegmental pulmonary artery embolized. Some artifactual hypoattenuation is  seen within the posterior segmental arteries of both lower lobes given the symmetric appearance on coronal met. No convincing central or lobar pulmonary artery filling defects are identified. Central pulmonary arteries are normal caliber. There is mild cardiomegaly with biatrial enlargement. Reflux of contrast is seen into the hepatic veins and IVC. Coronary artery calcifications are noted. No pericardial effusion. Suboptimal opacification of the aorta for luminal evaluation. Atherosclerotic plaque within the normal caliber aorta. Shared origin of brachiocephalic and left common carotid artery. Minimal plaque in the proximal great vessels. Mediastinum/Nodes: No mediastinal fluid or gas. Normal thyroid gland and thoracic inlet. No acute abnormality of the trachea or esophagus. No worrisome mediastinal, hilar or axillary adenopathy. Few low-attenuation, subcentimeter nodes may be reactive or edematous. Lungs/Pleura: Small moderate bilateral pleural effusions, right greater than left. Some adjacent atelectatic changes. Extensive interlobular septal thickening and fissural thickening throughout the lungs with pulmonary vascular redistribution and central pulmonary venous congestion with peribronchovascular thickening. More hazy ground-glass opacities are present in the dependent portions of the lungs and predominately within the lower lobes, which could reflect cardiogenic edema though atypical infection could have a similar appearance. No focal consolidative opacity. No pneumothorax. Upper Abdomen: No acute abnormalities present in the visualized portions of the upper abdomen. Musculoskeletal: Multilevel degenerative changes are present in the imaged portions of the spine. No acute osseous abnormality or suspicious osseous lesion. No worrisome chest wall lesions. Review of the MIP images confirms the above findings. IMPRESSION: 1. Satisfactory opacification the pulmonary arteries however evaluation is limited due to  respiratory motion artifact which may limit detection of smaller segmental and subsegmental pulmonary artery embolized. No convincing central or lobar pulmonary artery filling defects are identified to suggest pulmonary embolism. 2. Cardiomegaly with biatrial enlargement. Reflux of contrast into the hepatic veins and IVC may suggest elevated right heart pressures. 3. Small moderate bilateral pleural effusions, right greater than left, with adjacent atelectatic changes. 4. Septal thickening and vascular congestion with hazy ground-glass opacities in the dependent portions of the lungs, predominately within the lower lobes, favored to wreck deflect features of cardiogenic pulmonary edema. 5. Aortic Atherosclerosis (ICD10-I70.0). Electronically Signed   By: PLovena LeM.D.   On: 03/05/2020 01:34   DG Chest Port 1 View  Result Date: 03/04/2020 CLINICAL DATA:  Short of breath, hypotension, weakness, EXAM: PORTABLE CHEST 1 VIEW COMPARISON:  06/24/2009 FINDINGS: Single frontal view of the chest demonstrates an enlarged cardiac silhouette. There is chronic right pleural thickening. No airspace disease, effusion, or pneumothorax. IMPRESSION: 1. Enlarged cardiac silhouette. 2. No acute intrathoracic process. Electronically Signed   By: MRanda NgoM.D.   On: 03/04/2020 22:41   [place]KG SITE RITE  Result Date: 03/05/2020 If Site Rite image not attached, placement could not be confirmed due to current cardiac rhythm.     Medications:     Current Medications: . aspirin EC  325 mg Oral Daily  . enoxaparin (LOVENOX) injection  30 mg Subcutaneous Q24H     Infusions: . sodium chloride    . ceFEPime (MAXIPIME) IV 2 g (03/05/20 0958)  . norepinephrine (LEVOPHED) Adult infusion 12 mcg/min (03/05/20 0246)  . vancomycin  Assessment/Plan   1. A/C Systolic HF-->Cardiogenic Shock  - Has known ICM/NIMC 1V CAD with chronically occluded RCA/familial brother/dad heart transplant.   -Presented in  cardiogenic shock suspect due to high dose hf meds started on 6/7.  -Volume status elevated and with S3. Check ECHO  -Lactic Acid 3.5>2.1  - On norepi 12 mcg with improved pressure  - Start 80 mg IV lasix twice daily.  - No bb with shock  - No spiro/arb with hyperkalemia  - Hold off on dig. Likely start tomorrow.  - Place PICC to follow CVP/CO-OX  - Will need cath once tuned up.  - ECHO has been ordered.   2. Lactic Acidosis 3.5> 2.1  3. Hyperkalemia  K 6 on admit --> 5.2   4. ID  -WBC 16 on admit. CXR -Blood Cx pending -Started on cefepime + vancomycin   5. CAD  -1V chronically occluded RCA -Continue asa 81 mg daily.  - Check lipids and lfts.   6. Suspect AKI in the setting of shock  Creatinine 1.6   Medication concerns reviewed with Zachary Arias and pharmacy team. Barriers identified: Yes was off meds prior to admit. Plan to circle back to discuss med compliance.   Length of Stay: 0  Darrick Grinder, NP  03/05/2020, 10:43 AM  Advanced Heart Failure Team Pager [phone] (M-F; 7a - 4p)  Please contact [facility] for night-coverage after hours (4p -7a ) and weekends on [email or web address]  Agree with above.   59 y/o male with severe systolic HF due to mixed iCM and NICM. We last saw him 5 years ago. Over past few years was doing well and decided to switch his HF meds to supplements. In last few weeks developed recurrent HF symptoms. Saw a provider in [facility] who restarted his previous HF meds at previous doses.   Admitted overnight with cardiogenic shock with hypotension and lactic acidosis started on NE. Now beginning to feel better.   General:  Sitting up in bed. No resp difficulty HEENT: normal Neck: supple. JVP to jaw  Carotids 2+ bilat; no bruits. No lymphadenopathy or thryomegaly appreciated. Cor: PMI nondisplaced. Regular rate & rhythm. Prominent s3 Lungs: clear Abdomen: soft, nontender, nondistended. No hepatosplenomegaly. No bruits or masses. Good bowel  sounds. Extremities: no cyanosis, clubbing, rash, edema Neuro: alert & orientedx3, cranial nerves grossly intact. moves all 4 extremities w/o difficulty. Affect pleasant  He is in cardiogenic shock with significant volume overload. Will admit to ICU. Check echo. Place PICC. Will likely need milrinone. Start IV diuresis. Cath prior to d/c.    CRITICAL CARE Performed by: Glori Bickers  Total critical care time: 35 minutes  Critical care time was exclusive of separately billable procedures and treating other patients.  Critical care was necessary to treat or prevent imminent or life-threatening deterioration.  Critical care was time spent personally by me (independent of midlevel providers or residents) on the following activities: development of treatment plan with Zachary Arias and/or surrogate as well as nursing, discussions with consultants, evaluation of Zachary Arias's response to treatment, examination of Zachary Arias, obtaining history from Zachary Arias or surrogate, ordering and performing treatments and interventions, ordering and review of laboratory studies, ordering and review of radiographic studies, pulse oximetry and re-evaluation of Zachary Arias's condition.  Glori Bickers, MD  1:22 PM

## 2020-03-05 NOTE — Progress Notes (Signed)
Pharmacy Antibiotic Note  Zachary Arias is a 59 y.o. male admitted on 03/04/2020 with shortness of breath and weakness.  Pharmacy has been consulted for Vancomycin/Cefepime dosing for r/o sepsis. WBC is elevated. Noted renal dysfunction.   Plan: Vancomycin 1250 mg IV q24h >>Estimated AUC: 499 Cefepime 2g IV q12h Trend WBC, temp, renal function  F/U infectious work-up Drug levels as indicated   Height: 5\' 7"  (170.2 cm) Weight: 73.5 kg (162 lb) IBW/kg (Calculated) : 66.1  Temp (24hrs), Avg:96.7 F (35.9 C), Min:95.8 F (35.4 C), Max:97.6 F (36.4 C)  Recent Labs  Lab 03/04/20 2224 03/05/20 0208 03/05/20 0242  WBC 16.3*  --  13.9*  CREATININE 1.66*  --  1.46*  LATICACIDVEN 3.5* 2.1*  --     Estimated Creatinine Clearance: 52.2 mL/min (A) (by C-G formula based on SCr of 1.46 mg/dL (H)).    Allergies  Allergen Reactions  . Avocado Nausea Only  . Shellfish Allergy Nausea And Vomiting    05/05/20, PharmD, BCPS Clinical Pharmacist Phone: [phone]

## 2020-03-05 NOTE — ED Notes (Signed)
Ordered breakfast--Zachary Arias 

## 2020-03-05 NOTE — Progress Notes (Signed)
  Echocardiogram 2D Echocardiogram has been performed.  Juanluis Guastella G Darlyn Repsher 03/05/2020, 4:17 PM

## 2020-03-05 NOTE — Progress Notes (Addendum)
Attending addendum Did not see, reviewed case with CHF team: Dr. Haroldine Laws will take over as attending, let us know if we can be of further help. Dr. Tamala Julian     NAME:  Zachary Arias, MRN:  [id number], DOB:  04-11-1961, LOS: 0 ADMISSION DATE:  03/04/2020, CONSULTATION DATE: 03/05/2020 REFERRING MD: Emergency room physician, CHIEF COMPLAINT: Shortness of breath and weakness   History of present illness   59 year old male with history of ischemic/nonischemic cardiomyopathy ejection fraction 25 to 30%, hypertension, CAD coming into the emergency room with 2 weeks history of shortness of breath.  Patient states that he has been short of breath for few weeks and has been intermittent usually with minimal exertion he finally seeks medical advice and so I Dr. [facility] on Monday where he was started on Coreg spironolactone and lisinopril and shortly after starting his medication started feeling very weak and almost about to pass out.  Patient states that every time he takes his medicine 2 hours after he starts getting very weak so he decided to come to the emergency room today.  Patient was found to be hypotensive and was started on Levophed drip. CTA of the chest was done and did not show any PE but showed cardiomegaly and biatrial enlargement and small moderate bilateral pleural effusions  Past Medical History   -CAD -Ischemic and nonischemic cardiomyopathy ejection fraction 25 to 30% in 2015 -Hypertension    Objective   Blood pressure (!) 83/66, pulse 100, temperature 97.6 F (36.4 C), temperature source Oral, resp. rate (!) 22, height 5\' 7"  (1.702 m), weight 73.5 kg, SpO2 99 %.        Intake/Output Summary (Last 24 hours) at 03/05/2020 0930 Last data filed at 03/05/2020 0555 Gross per 24 hour  Intake --  Output 250 ml  Net -250 ml   Filed Weights   03/04/20 2209  Weight: 73.5 kg    Examination: General: Acutely ill on nasal cannula not in any distress HENT: Moist Lungs: Clear equal  air sounds bilaterally no crackles no wheezing Cardiovascular: Normal heart sound no added sounds or murmurs Abdomen: Soft no tenderness no guarding Extremities: No lower limb edema Neuro: Awake alert oriented x3 power 5/5 all over   Assessment & Plan:   Shock in the setting of ischemic cardiomyopathy with EF of 25% with recent starting on antihypertensives after being noncompliant with medications for several years. Hold antihypertensive Levophed to keep systolic blood pressure greater than 90 and/or mean arterial pressure greater than 60 Cardiology consulted May need PA catheter and/or heart cath per cardiology 2D echocardiogram  Acute kidney injury Hyperkalemia Recent Labs  Lab 03/04/20 2224 03/04/20 2340  K 6.0* 5.7*    Lab Results  Component Value Date   CREATININE 1.46 (H) 03/05/2020   CREATININE 1.66 (H) 03/04/2020   CREATININE 1.22 09/18/2014  Monitor renal function Note was recently started on lisinopril this is currently on hold Mild fluid resuscitation may be needed Continue to monitor potassium  Bilateral pleural effusion Continue to monitor hopefully will improve with improved cardiac function  Questionable sepsis with elevated WBC Empiric antimicrobial therapy with vancomycin and cefepime start date 03/05/2020 [place] Check procalcitonin for completeness       Best practice:  Diet: Cardiac diet Pain/Anxiety/Delirium protocol (if indicated): None VAP protocol (if indicated): None DVT prophylaxis: Lovenox GI prophylaxis: None Glucose control: None Mobility: Bedrest Code Status: Full code Family Communication: No family around Disposition:   Labs   CBC: Recent Labs  Lab  03/04/20 2224 03/05/20 0242  WBC 16.3* 13.9*  NEUTROABS 13.7*  --   HGB 13.6 12.6*  HCT 42.7 38.8*  MCV 87.1 87.2  PLT 197 206    Basic Metabolic Panel: Recent Labs  Lab 03/04/20 2224 03/04/20 2340 03/05/20 0242  NA 132*  --   --   K 6.0* 5.7*  --   CL 104   --   --   CO2 17*  --   --   GLUCOSE 154*  --   --   BUN 25*  --   --   CREATININE 1.66*  --  1.46*  CALCIUM 8.2*  --   --    GFR: Estimated Creatinine Clearance: 52.2 mL/min (A) (by C-G formula based on SCr of 1.46 mg/dL (H)). Recent Labs  Lab 03/04/20 2224 03/05/20 0208 03/05/20 0242  WBC 16.3*  --  13.9*  LATICACIDVEN 3.5* 2.1*  --     Liver Function Tests: No results for input(s): AST, ALT, ALKPHOS, BILITOT, PROT, ALBUMIN in the last 168 hours. No results for input(s): LIPASE, AMYLASE in the last 168 hours. No results for input(s): AMMONIA in the last 168 hours.  ABG    Component Value Date/Time   PHART 7.401 06/27/2009 0824   PCO2ART 43.4 06/27/2009 0824   PO2ART 41.0 (L) 06/27/2009 0824   HCO3 26.3 (H) 06/27/2009 0847   TCO2 28 06/27/2009 0847   ACIDBASEDEF 2.0 06/27/2009 0809   O2SAT 77.0 06/27/2009 0847     Coagulation Profile: No results for input(s): INR, PROTIME in the last 168 hours.  Cardiac Enzymes: No results for input(s): CKTOTAL, CKMB, CKMBINDEX, TROPONINI in the last 168 hours.  HbA1C: No results found for: HGBA1C  CBG: No results for input(s): GLUCAP in the last 168 hours.    App cct 45 min   Brett Canales Minor ACNP Acute Care Nurse Practitioner [facility] Pulmonary/Critical Care Please consult Amion 03/05/2020, 9:31 AM

## 2020-03-05 NOTE — H&P (Signed)
NAME:  Zachary Arias, MRN:  [id number], DOB:  September 23, 1961, LOS: 0 ADMISSION DATE:  03/04/2020, CONSULTATION DATE: 03/05/2020 REFERRING MD: Emergency room physician, CHIEF COMPLAINT: Shortness of breath and weakness   History of present illness   59 year old male with history of ischemic/nonischemic cardiomyopathy ejection fraction 25 to 30%, hypertension, CAD coming into the emergency room with 2 weeks history of shortness of breath.  Patient states that he has been short of breath for few weeks and has been intermittent usually with minimal exertion he finally seeks medical advice and so I Dr. At [facility] on Monday where he was started on Coreg spironolactone and lisinopril and shortly after starting his medication started feeling very weak and almost about to pass out.  Patient states that every time he takes his medicine 2 hours after he starts getting very weak so he decided to come to the emergency room today.  Patient was found to be hypotensive and was started on Levophed drip. CTA of the chest was done and did not show any PE but showed cardiomegaly and biatrial enlargement and small moderate bilateral pleural effusions  Past Medical History   -CAD -Ischemic and nonischemic cardiomyopathy ejection fraction 25 to 30% in 2015 -Hypertension    Objective   Blood pressure 97/77, pulse 94, temperature 97.6 F (36.4 C), temperature source Oral, resp. rate 20, height 5\' 7"  (1.702 m), weight 73.5 kg, SpO2 100 %.       No intake or output data in the 24 hours ending 03/05/20 0238 Filed Weights   03/04/20 2209  Weight: 73.5 kg    Examination: General: Acutely ill on nasal cannula not in any distress HENT: Moist Lungs: Clear equal air sounds bilaterally no crackles no wheezing Cardiovascular: Normal heart sound no added sounds or murmurs Abdomen: Soft no tenderness no guarding Extremities: No lower limb edema Neuro: Awake alert oriented x3 power 5/5 all over   Assessment & Plan:    Assessment: -Cardiogenic shock secondary to cardiomyopathy and restarting blood pressure medicines -Acute kidney injury -Hyperkalemia -Bilateral pleural effusions -Chronic systolic congestive heart failure -Ischemic and nonischemic cardiomyopathy with ejection fraction 25%   Plan: -Admit to the intensive care and for further management -Titrate Levophed to keep map above 60 and systolic blood pressure above 90 -Follow urine output and renal function -Follow repeat potassium level -Hold antihypertensive medicines -Echocardiogram -Cardiology consult in the morning    Best practice:  Diet: Cardiac diet Pain/Anxiety/Delirium protocol (if indicated): None VAP protocol (if indicated): None DVT prophylaxis: Lovenox GI prophylaxis: None Glucose control: None Mobility: Bedrest Code Status: Full code Family Communication: No family around Disposition:   Labs   CBC: Recent Labs  Lab 03/04/20 2224  WBC 16.3*  NEUTROABS 13.7*  HGB 13.6  HCT 42.7  MCV 87.1  PLT 197    Basic Metabolic Panel: Recent Labs  Lab 03/04/20 2224 03/04/20 2340  NA 132*  --   K 6.0* 5.7*  CL 104  --   CO2 17*  --   GLUCOSE 154*  --   BUN 25*  --   CREATININE 1.66*  --   CALCIUM 8.2*  --    GFR: Estimated Creatinine Clearance: 45.9 mL/min (A) (by C-G formula based on SCr of 1.66 mg/dL (H)). Recent Labs  Lab 03/04/20 2224  WBC 16.3*  LATICACIDVEN 3.5*    Liver Function Tests: No results for input(s): AST, ALT, ALKPHOS, BILITOT, PROT, ALBUMIN in the last 168 hours. No results for input(s): LIPASE, AMYLASE in the  last 168 hours. No results for input(s): AMMONIA in the last 168 hours.  ABG    Component Value Date/Time   PHART 7.401 06/27/2009 0824   PCO2ART 43.4 06/27/2009 0824   PO2ART 41.0 (L) 06/27/2009 0824   HCO3 26.3 (H) 06/27/2009 0847   TCO2 28 06/27/2009 0847   ACIDBASEDEF 2.0 06/27/2009 0809   O2SAT 77.0 06/27/2009 0847     Coagulation Profile: No results for  input(s): INR, PROTIME in the last 168 hours.  Cardiac Enzymes: No results for input(s): CKTOTAL, CKMB, CKMBINDEX, TROPONINI in the last 168 hours.  HbA1C: No results found for: HGBA1C  CBG: No results for input(s): GLUCAP in the last 168 hours.  Review of Systems:   All 12 point system review were unremarkable other than what is mentioned history of present illness  Past Medical History  He,  has a past medical history of Congestive heart failure (CHF) (HCC), Coronary artery disease, History of hypertension, Inguinal hernia (2005), and Tachycardia (2010 ).   Surgical History    Past Surgical History:  Procedure Laterality Date  . CARDIAC CATHETERIZATION   06/27/2009   ejection fraction of approximately 35%  . INGUINAL HERNIA REPAIR  01/27/2004   History of left inguinal hernia, status post repair in 2005     Social History      Family History   His family history includes Cancer in his mother; Cardiomyopathy in his brother; Heart attack in his father; Heart disease in his father.   Allergies Allergies  Allergen Reactions  . Avocado Nausea Only  . Shellfish Allergy Nausea And Vomiting     Home Medications  Prior to Admission medications   Medication Sig Start Date End Date Taking? Authorizing Provider  Ascorbic Acid (VITAMIN C) 1000 MG tablet Take 1,000 mg by mouth daily.   Yes [provider]  carvedilol (COREG) 25 MG tablet TAKE 1 TABLET TWICE A DAY Patient taking differently: Take 25 mg by mouth 2 (two) times daily with a meal.  11/26/15  Yes Bensimhon, Bevelyn Buckles, MD  Cholecalciferol ([employer] VITAMIN D3) 250 MCG (10000 UT) CAPS Take 1 capsule by mouth 2 (two) times a week.   Yes [provider]  lisinopril (ZESTRIL) 10 MG tablet Take 10 mg by mouth daily. 03/03/20  Yes [provider]  Multiple Vitamins-Minerals (CENTRUM ULTRA MENS PO) Take 1 tablet by mouth daily.    Yes [provider]  spironolactone (ALDACTONE) 25 MG tablet Take 1  tablet (25 mg total) by mouth daily. 04/05/16  Yes Bensimhon, Bevelyn Buckles, MD  vitamin E 600 UNIT capsule Take 600 Units by mouth once a week.   Yes [provider]  aspirin EC 81 MG tablet Take 1 tablet (81 mg total) by mouth daily. Patient not taking: Reported on 03/04/2020 06/11/13   Laurey Morale, MD  atorvastatin (LIPITOR) 40 MG tablet TAKE 1 TABLET DAILY (REPLACES SIMVASTATIN) Patient not taking: Reported on 03/04/2020 07/13/16   Bensimhon, Bevelyn Buckles, MD  furosemide (LASIX) 20 MG tablet Take 1 tablet (20 mg total) by mouth daily as needed. Patient not taking: Reported on 09/18/2014 05/08/12   Hadassah Pais, PA-C  lisinopril (PRINIVIL,ZESTRIL) 20 MG tablet TAKE 1 TABLET TWICE A DAY Patient not taking: No sig reported 11/26/15   Bensimhon, Bevelyn Buckles, MD  potassium chloride SA (K-DUR,KLOR-CON) 20 MEQ tablet Take 1 tablet (20 mEq total) by mouth daily as needed. Patient not taking: Reported on 09/18/2014 05/08/12   Hadassah Pais, PA-C  Critical care time: I have spent 33 minutes of critical care time this time was spent at bedside or in the unit this time was exclusive of any billable procedures patient is needing intensive care due to complex medical problems requiring complex medical decisions.

## 2020-03-05 NOTE — Progress Notes (Signed)
Notified provider of need to order lactic acid. ° °

## 2020-03-05 NOTE — Progress Notes (Signed)
Peripherally Inserted Central Catheter Placement  The IV Nurse has discussed with the patient and/or persons authorized to consent for the patient, the purpose of this procedure and the potential benefits and risks involved with this procedure.  The benefits include less needle sticks, lab draws from the catheter, and the patient may be discharged home with the catheter. Risks include, but not limited to, infection, bleeding, blood clot (thrombus formation), and puncture of an artery; nerve damage and irregular heartbeat and possibility to perform a PICC exchange if needed/ordered by physician.  Alternatives to this procedure were also discussed.  Bard Power PICC patient education guide, fact sheet on infection prevention and patient information card has been provided to patient /or left at bedside.    PICC Placement Documentation  PICC Double Lumen 03/05/20 PICC Right Basilic 38 cm 0 cm (Active)  Indication for Insertion or Continuance of Line Vasoactive infusions 03/05/20 2054  Exposed Catheter (cm) 0 cm 03/05/20 2054  Site Assessment Clean;Dry;Intact 03/05/20 2054  Lumen #1 Status Flushed;Saline locked;Blood return noted 03/05/20 2054  Lumen #2 Status Flushed;Saline locked;Blood return noted 03/05/20 2054  Dressing Type Transparent 03/05/20 2054  Dressing Status Clean;Dry;Intact;Antimicrobial disc in place 03/05/20 2054  Safety Lock Not Applicable 03/05/20 2054  Dressing Intervention New dressing 03/05/20 2054  Dressing Change Due 03/12/20 03/05/20 2054       Ethelda Chick 03/05/2020, 8:55 PM

## 2020-03-05 NOTE — Progress Notes (Signed)
Vascular Access team called to confirm time for urgent PICC. Team stated they will do their best to place it today.

## 2020-03-05 NOTE — ED Notes (Signed)
Pt sitting up on side of bed eating'

## 2020-03-06 ENCOUNTER — Inpatient Hospital Stay (HOSPITAL_COMMUNITY): Payer: BC Managed Care – PPO

## 2020-03-06 LAB — BASIC METABOLIC PANEL
Anion gap: 10 (ref 5–15)
BUN: 29 mg/dL — ABNORMAL HIGH (ref 6–20)
CO2: 21 mmol/L — ABNORMAL LOW (ref 22–32)
Calcium: 7.9 mg/dL — ABNORMAL LOW (ref 8.9–10.3)
Chloride: 107 mmol/L (ref 98–111)
Creatinine, Ser: 1.54 mg/dL — ABNORMAL HIGH (ref 0.61–1.24)
GFR calc Af Amer: 57 mL/min — ABNORMAL LOW (ref >60)
GFR calc non Af Amer: 49 mL/min — ABNORMAL LOW (ref >60)
Glucose, Bld: 130 mg/dL — ABNORMAL HIGH (ref 70–99)
Potassium: 4.1 mmol/L (ref 3.5–5.1)
Sodium: 138 mmol/L (ref 135–145)

## 2020-03-06 LAB — CBC WITH DIFFERENTIAL/PLATELET
Abs Immature Granulocytes: 0.07 10*3/uL (ref 0.00–0.07)
Basophils Absolute: 0 10*3/uL (ref 0.0–0.1)
Basophils Relative: 0 %
Eosinophils Absolute: 0 10*3/uL (ref 0.0–0.5)
Eosinophils Relative: 0 %
HCT: 39.5 % (ref 39.0–52.0)
Hemoglobin: 12.8 g/dL — ABNORMAL LOW (ref 13.0–17.0)
Immature Granulocytes: 1 %
Lymphocytes Relative: 11 %
Lymphs Abs: 1.4 10*3/uL (ref 0.7–4.0)
MCH: 27.4 pg (ref 26.0–34.0)
MCHC: 32.4 g/dL (ref 30.0–36.0)
MCV: 84.6 fL (ref 80.0–100.0)
Monocytes Absolute: 1 10*3/uL (ref 0.1–1.0)
Monocytes Relative: 8 %
Neutro Abs: 10.4 10*3/uL — ABNORMAL HIGH (ref 1.7–7.7)
Neutrophils Relative %: 80 %
Platelets: 203 10*3/uL (ref 150–400)
RBC: 4.67 MIL/uL (ref 4.22–5.81)
RDW: 14.7 % (ref 11.5–15.5)
WBC: 12.9 10*3/uL — ABNORMAL HIGH (ref 4.0–10.5)
nRBC: 0 % (ref 0.0–0.2)

## 2020-03-06 LAB — URINE CULTURE: Culture: NO GROWTH

## 2020-03-06 LAB — HEPATIC FUNCTION PANEL
ALT: 346 U/L — ABNORMAL HIGH (ref 0–44)
AST: 148 U/L — ABNORMAL HIGH (ref 15–41)
Albumin: 3 g/dL — ABNORMAL LOW (ref 3.5–5.0)
Alkaline Phosphatase: 52 U/L (ref 38–126)
Bilirubin, Direct: 0.1 mg/dL (ref 0.0–0.2)
Indirect Bilirubin: 0.8 mg/dL (ref 0.3–0.9)
Total Bilirubin: 0.9 mg/dL (ref 0.3–1.2)
Total Protein: 4.9 g/dL — ABNORMAL LOW (ref 6.5–8.1)

## 2020-03-06 LAB — LIPID PANEL
Cholesterol: 179 mg/dL (ref 0–200)
HDL: 42 mg/dL (ref >40)
LDL Cholesterol: 119 mg/dL — ABNORMAL HIGH (ref 0–99)
Total CHOL/HDL Ratio: 4.3 RATIO
Triglycerides: 91 mg/dL (ref <150)
VLDL: 18 mg/dL (ref 0–40)

## 2020-03-06 LAB — PROCALCITONIN: Procalcitonin: 0.11 ng/mL

## 2020-03-06 LAB — COOXEMETRY PANEL
Carboxyhemoglobin: 0.6 % (ref 0.5–1.5)
Carboxyhemoglobin: 1 % (ref 0.5–1.5)
Carboxyhemoglobin: 0.9 % (ref 0.5–1.5)
Carboxyhemoglobin: 0.6 % (ref 0.5–1.5)
Methemoglobin: 0.8 % (ref 0.0–1.5)
Methemoglobin: 1 % (ref 0.0–1.5)
Methemoglobin: 1.1 % (ref 0.0–1.5)
Methemoglobin: 0.9 % (ref 0.0–1.5)
O2 Saturation: 44.7 %
O2 Saturation: 46.7 %
O2 Saturation: 40 %
O2 Saturation: 45.7 %
Total hemoglobin: 13.1 g/dL (ref 12.0–16.0)
Total hemoglobin: 14 g/dL (ref 12.0–16.0)
Total hemoglobin: 13.3 g/dL (ref 12.0–16.0)
Total hemoglobin: 13.2 g/dL (ref 12.0–16.0)

## 2020-03-06 LAB — MAGNESIUM: Magnesium: 2.3 mg/dL (ref 1.7–2.4)

## 2020-03-06 LAB — PHOSPHORUS: Phosphorus: 3.1 mg/dL (ref 2.5–4.6)

## 2020-03-06 MED ORDER — AMIODARONE HCL IN DEXTROSE 360-4.14 MG/200ML-% IV SOLN
60.0000 mg/h | INTRAVENOUS | Status: AC
  Administered 2020-03-06: 60 mg/h via INTRAVENOUS
  Filled 2020-03-06: qty 200

## 2020-03-06 MED ORDER — FUROSEMIDE 10 MG/ML IJ SOLN
80.0000 mg | Freq: Two times a day (BID) | INTRAMUSCULAR | Status: DC
  Administered 2020-03-06 (×2): 80 mg via INTRAVENOUS
  Filled 2020-03-06 (×2): qty 8

## 2020-03-06 MED ORDER — MILRINONE LACTATE IN DEXTROSE 20-5 MG/100ML-% IV SOLN
0.3750 ug/kg/min | INTRAVENOUS | Status: DC
  Administered 2020-03-06: 0.375 ug/kg/min via INTRAVENOUS
  Administered 2020-03-06: 0.25 ug/kg/min via INTRAVENOUS
  Administered 2020-03-07 – 2020-03-09 (×6): 0.375 ug/kg/min via INTRAVENOUS
  Filled 2020-03-06 (×8): qty 100

## 2020-03-06 MED ORDER — SPIRONOLACTONE 12.5 MG HALF TABLET
12.5000 mg | ORAL_TABLET | Freq: Every day | ORAL | Status: DC
  Administered 2020-03-06: 12.5 mg via ORAL
  Filled 2020-03-06: qty 1

## 2020-03-06 MED ORDER — POTASSIUM CHLORIDE CRYS ER 20 MEQ PO TBCR
30.0000 meq | EXTENDED_RELEASE_TABLET | Freq: Once | ORAL | Status: AC
  Administered 2020-03-06: 30 meq via ORAL
  Filled 2020-03-06: qty 1

## 2020-03-06 MED ORDER — AMIODARONE HCL IN DEXTROSE 360-4.14 MG/200ML-% IV SOLN
30.0000 mg/h | INTRAVENOUS | Status: DC
  Administered 2020-03-06 – 2020-03-11 (×12): 30 mg/h via INTRAVENOUS
  Filled 2020-03-06 (×11): qty 200

## 2020-03-06 MED ORDER — DIGOXIN 125 MCG PO TABS
0.1250 mg | ORAL_TABLET | Freq: Every day | ORAL | Status: DC
  Administered 2020-03-06 – 2020-03-13 (×8): 0.125 mg via ORAL
  Filled 2020-03-06 (×8): qty 1

## 2020-03-06 MED ORDER — SENNOSIDES-DOCUSATE SODIUM 8.6-50 MG PO TABS
2.0000 | ORAL_TABLET | Freq: Once | ORAL | Status: AC
  Administered 2020-03-06: 2 via ORAL
  Filled 2020-03-06: qty 2

## 2020-03-06 NOTE — Progress Notes (Signed)
   On Norepi 11 mcg + milrinone 0.25 mcg    CO-OX 40%. Increase milrinone to 0.375 mcg now.   Worthington Cruzan NP-C  1:54 PM

## 2020-03-06 NOTE — Progress Notes (Addendum)
Advanced Heart Failure Rounding Note  PCP-Cardiologist: No primary care provider on file.   Subjective:   Admitted with cardiogenic shock. Placed on norepi and diuresed with IV lasix.   ECHO EF 20-25% RV normal. Severe MR  Over night norepi weaned to 2 mcg. CO-OX 45%  CVP 11-12 today.   Denies SOB. Denies chest pain.   Objective:   Weight Range: 79.9 kg Body mass index is 27.59 kg/m.   Vital Signs:   Temp:  [97.8 F (36.6 C)-99.2 F (37.3 C)] 97.8 F (36.6 C) (06/10 0739) Pulse Rate:  [68-103] 99 (06/10 0700) Resp:  [11-30] 23 (06/10 0700) BP: (78-136)/(53-110) 98/77 (06/10 0700) SpO2:  [97 %-100 %] 99 % (06/10 0700) Weight:  [79.9 kg] 79.9 kg (06/10 0400) Last BM Date: 03/04/20  Weight change: Filed Weights   03/04/20 2209 03/06/20 0400  Weight: 73.5 kg 79.9 kg    Intake/Output:   Intake/Output Summary (Last 24 hours) at 03/06/2020 0854 Last data filed at 03/06/2020 0700 Gross per 24 hour  Intake 851.75 ml  Output 950 ml  Net -98.25 ml      Physical Exam   CVP 11-12  General:   No resp difficulty HEENT: Normal Neck: Supple. JVP 11-12  . Carotids 2+ bilat; no bruits. No lymphadenopathy or thyromegaly appreciated. Cor: PMI nondisplaced. Regular rate & rhythm. No rubs, or murmurs. +S3  Lungs: Clear Abdomen: Soft, nontender, nondistended. No hepatosplenomegaly. No bruits or masses. Good bowel sounds. Extremities: No cyanosis, clubbing, rash, edema. RUE PICC  Neuro: Alert & orientedx3, cranial nerves grossly intact. moves all 4 extremities w/o difficulty. Affect pleasant   Telemetry   SR with frequent PVCs.   EKG    N/a   Labs    CBC Recent Labs    03/04/20 2224 03/04/20 2224 03/05/20 0242 03/06/20 0533  WBC 16.3*   < > 13.9* 12.9*  NEUTROABS 13.7*  --   --  10.4*  HGB 13.6   < > 12.6* 12.8*  HCT 42.7   < > 38.8* 39.5  MCV 87.1   < > 87.2 84.6  PLT 197   < > 206 203   < > = values in this interval not displayed.   Basic Metabolic  Panel Recent Labs    03/04/20 2224 03/04/20 2224 03/04/20 2340 03/05/20 0242 03/05/20 0940 03/06/20 0420  NA 132*  --   --   --   --  138  K 6.0*   < > 5.7*  --   --  4.1  CL 104  --   --   --   --  107  CO2 17*  --   --   --   --  21*  GLUCOSE 154*  --   --   --   --  130*  BUN 25*  --   --   --   --  29*  CREATININE 1.66*   < >  --  1.46*  --  1.54*  CALCIUM 8.2*  --   --   --   --  7.9*  MG  --   --   --   --  1.8 2.3  PHOS  --   --   --   --   --  3.1   < > = values in this interval not displayed.   Liver Function Tests Recent Labs    03/06/20 0420  AST 148*  ALT 346*  ALKPHOS 52  BILITOT 0.9  PROT 4.9*  ALBUMIN 3.0*   No results for input(s): LIPASE, AMYLASE in the last 72 hours. Cardiac Enzymes No results for input(s): CKTOTAL, CKMB, CKMBINDEX, TROPONINI in the last 72 hours.  BNP: BNP (last 3 results) Recent Labs    03/04/20 2224  BNP 1,300.0*    ProBNP (last 3 results) No results for input(s): PROBNP in the last 8760 hours.   D-Dimer No results for input(s): DDIMER in the last 72 hours. Hemoglobin A1C No results for input(s): HGBA1C in the last 72 hours. Fasting Lipid Panel Recent Labs    03/06/20 0420  CHOL 179  HDL 42  LDLCALC 119*  TRIG 91  CHOLHDL 4.3   Thyroid Function Tests Recent Labs    03/05/20 0043  TSH 2.396    Other results:   Imaging    ECHOCARDIOGRAM COMPLETE  Result Date: 03/05/2020    ECHOCARDIOGRAM REPORT   Patient Name:   EUAL LINDSTROM Date of Exam: 03/05/2020 Medical Rec #:  [id number]     Height:       67.0 in Accession #:    [id number]    Weight:       162.0 lb Date of Birth:  1960-10-02     BSA:          1.849 m Patient Age:    59 years      BP:           101/75 mmHg Patient Gender: M             HR:           99 bpm. Exam Location:  Inpatient Procedure: 2D Echo, Cardiac Doppler, Color Doppler, 3D Echo and Intracardiac            Opacification Agent Indications:    I50.23 Acute on chronic systolic (congestive)  heart failure  History:        Patient has prior history of Echocardiogram examinations, most                 recent 05/08/2012. CAD; Risk Factors:Hypertension.  Sonographer:    Jonelle Sidle Dance Referring Phys: [id number] Virgina [place] ALJISHI IMPRESSIONS  1. Left ventricular ejection fraction, by estimation, is 20 to 25%. The left ventricle has severely decreased function. The left ventricle demonstrates global hypokinesis. The left ventricular internal cavity size was moderately to severely dilated. Left ventricular diastolic parameters are consistent with Grade III diastolic dysfunction (restrictive). Elevated left ventricular end-diastolic pressure.  2. Right ventricular systolic function is normal. The right ventricular size is normal. There is moderately elevated pulmonary artery systolic pressure.  3. Left atrial size was severely dilated.  4. Right atrial size was mild to moderately dilated.  5. Mitral leaflets do not coapt, resulting in severe mitral regurgitation.. The mitral valve is abnormal. Severe mitral valve regurgitation. No evidence of mitral stenosis.  6. Tricuspid valve regurgitation is moderate to severe.  7. The aortic valve is tricuspid. Aortic valve regurgitation is mild. No aortic stenosis is present.  8. Moderately dilated pulmonary artery.  9. The inferior vena cava is dilated in size with <50% respiratory variability, suggesting right atrial pressure of 15 mmHg. Conclusion(s)/Recommendation(s): Severely reduced LV function, dilated LV, and severe mitral regurgitation with non-coapting mitral valve leaflets. FINDINGS  Left Ventricle: Left ventricular ejection fraction, by estimation, is 20 to 25%. The left ventricle has severely decreased function. The left ventricle demonstrates global hypokinesis. Definity contrast agent was given IV to delineate the left ventricular endocardial borders. The left ventricular internal  cavity size was moderately to severely dilated. There is no left ventricular  hypertrophy. Left ventricular diastolic parameters are consistent with Grade III diastolic dysfunction (restrictive). Elevated left ventricular end-diastolic pressure. Right Ventricle: The right ventricular size is normal. No increase in right ventricular wall thickness. Right ventricular systolic function is normal. There is moderately elevated pulmonary artery systolic pressure. The tricuspid regurgitant velocity is 3.02 m/s, and with an assumed right atrial pressure of 15 mmHg, the estimated right ventricular systolic pressure is 51.5 mmHg. Left Atrium: Left atrial size was severely dilated. Right Atrium: Right atrial size was mild to moderately dilated. Pericardium: A small pericardial effusion is present. Mitral Valve: Mitral leaflets do not coapt, resulting in severe mitral regurgitation. The mitral valve is abnormal. Severe mitral valve regurgitation. No evidence of mitral valve stenosis. Tricuspid Valve: The tricuspid valve is normal in structure. Tricuspid valve regurgitation is moderate to severe. No evidence of tricuspid stenosis. Aortic Valve: The aortic valve is tricuspid. Aortic valve regurgitation is mild. No aortic stenosis is present. Pulmonic Valve: The pulmonic valve was normal in structure. Pulmonic valve regurgitation is mild. No evidence of pulmonic stenosis. Aorta: The aortic root and ascending aorta are structurally normal, with no evidence of dilitation. Pulmonary Artery: The pulmonary artery is moderately dilated. Venous: The inferior vena cava is dilated in size with less than 50% respiratory variability, suggesting right atrial pressure of 15 mmHg. IAS/Shunts: The atrial septum is grossly normal. Additional Comments: There is a small pleural effusion in both left and right lateral regions.  LEFT VENTRICLE PLAX 2D LVIDd:         6.47 cm  Diastology LVIDs:         5.25 cm  LV e' lateral:   8.49 cm/s LV PW:         1.13 cm  LV E/e' lateral: 15.3 LV IVS:        0.81 cm  LV e' medial:    7.62  cm/s LVOT diam:     1.80 cm  LV E/e' medial:  17.1 LV SV:         23 LV SV Index:   13 LVOT Area:     2.54 cm  RIGHT VENTRICLE             IVC RV Basal diam:  2.95 cm     IVC diam: 2.35 cm RV S prime:     14.60 cm/s TAPSE (M-mode): 1.9 cm LEFT ATRIUM              Index        RIGHT ATRIUM           Index LA diam:        6.20 cm  3.35 cm/m   RA Area:     18.80 cm LA Vol (A2C):   251.0 ml 135.75 ml/m RA Volume:   51.10 ml  27.64 ml/m LA Vol (A4C):   139.0 ml 75.17 ml/m LA Biplane Vol: 197.0 ml 106.54 ml/m  AORTIC VALVE LVOT Vmax:   59.70 cm/s LVOT Vmean:  39.300 cm/s LVOT VTI:    0.092 m  AORTA Ao Root diam: 2.60 cm Ao Asc diam:  2.80 cm MITRAL VALVE                 TRICUSPID VALVE MV Area (PHT): 3.34 cm      TR Peak grad:   36.5 mmHg MV Decel Time: 227 msec      TR Vmax:  302.00 cm/s MR Peak grad:    65.6 mmHg MR Mean grad:    35.5 mmHg   SHUNTS MR Vmax:         405.00 cm/s Systemic VTI:  0.09 m MR Vmean:        272.5 cm/s  Systemic Diam: 1.80 cm MR PISA:         3.08 cm MR PISA Eff ROA: 28 mm MR PISA Radius:  0.70 cm MV E velocity: 130.00 cm/s Jodelle Red MD Electronically signed by Jodelle Red MD Signature Date/Time: 03/05/2020/4:56:51 PM    Final    [place] EKG SITE RITE  Result Date: 03/05/2020 If Site Rite image not attached, placement could not be confirmed due to current cardiac rhythm.     Medications:     Scheduled Medications: . aspirin EC  81 mg Oral Daily  . Chlorhexidine Gluconate Cloth  6 each Topical Daily  . enoxaparin (LOVENOX) injection  40 mg Subcutaneous Q24H  . sodium chloride flush  10-40 mL Intracatheter Q12H     Infusions: . sodium chloride    . ceFEPime (MAXIPIME) IV 2 g (03/06/20 0845)  . norepinephrine (LEVOPHED) Adult infusion 3 mcg/min (03/06/20 0700)  . vancomycin Stopped (03/06/20 0041)     PRN Medications:  docusate sodium, polyethylene glycol, sodium chloride flush     Assessment/Plan   1. A/C Systolic HF-->Cardiogenic  Shock  - Has known ICM/NIMC 1V CAD with chronically occluded RCA/familial brother/dad heart transplant.   -Presented in cardiogenic shock suspect due to high dose hf meds started on 6/7.  -Volume status elevated and with S3. Check ECHO  -Lactic Acid 3.5>2.1  - CO-OX 45%. Add milrinone 0.25 mcg and continue norepi and titrate as needed.  - CVP trending down. Continue IV lasix.   - No bb with shock  - No spiro/arb with hyperkalemia  - Add digoxin 0.125 mg daily.   - Will need cath once tuned up.  - ECHO EF 20-25% severe MR.    2. Lactic Acidosis 3.5> 2.1  3. Hyperkalemia  K 6 on admit --> 4.1 today   4. ID  -WBC 16 on admit-->12.9  -Blood Cx pending -Started on cefepime + vancomycin  - De-escalate antibiotics.   5. CAD  -1V chronically occluded RCA -Continue asa 81 mg daily.  - Add statin after shock resolves.   6. Suspect AKI in the setting of shock  Creatinine 1.7 on admit.  - Todays creatinine 1.5  7. Elevated LFTs -Suspect in the setting of shock.  - Follow LFTs  8. MR, severe  Noted on ECHO   9. PVCs Add amiodarone drip to suppress PVCs.  K 4.1  Mag 2.3    Length of Stay: 1  Amy Clegg, NP  03/06/2020, 8:54 AM  Advanced Heart Failure Team Pager [phone] (M-F; 7a - 4p)  Please contact [facility] for night-coverage after hours (4p -7a ) and weekends on amion  Agree with above.  Remains in cardiogenic shock despite NE support. EF 20% with severe MR on echo.  General: Sitting up in bed No resp difficulty HEENT: normal Neck: supple. JVP to jaw Carotids 2+ bilat; no bruits. No lymphadenopathy or thryomegaly appreciated. Cor: PMI nondisplaced. Regular rate & rhythm +s3 2/6 MR Lungs: clear Abdomen: soft, nontender, nondistended. No hepatosplenomegaly. No bruits or masses. Good bowel sounds. Extremities: no cyanosis, clubbing, rash, edema Neuro: alert & orientedx3, cranial nerves grossly intact. moves all 4 extremities w/o difficulty. Affect  pleasant   Remains in shock.  Add milrinone to NE. Start dig and spiro.Add amio to suppress PVCs. IV diuresis. Plan cath likely on Monday. Likely need LifeVest at d/c. Stop abx.   CRITICAL CARE Performed by: Arvilla Meres  Total critical care time: 35 minutes  Critical care time was exclusive of separately billable procedures and treating other patients.  Critical care was necessary to treat or prevent imminent or life-threatening deterioration.  Critical care was time spent personally by me (independent of midlevel providers or residents) on the following activities: development of treatment plan with patient and/or surrogate as well as nursing, discussions with consultants, evaluation of patient's response to treatment, examination of patient, obtaining history from patient or surrogate, ordering and performing treatments and interventions, ordering and review of laboratory studies, ordering and review of radiographic studies, pulse oximetry and re-evaluation of patient's condition.  Arvilla Meres, MD  11:28 AM

## 2020-03-07 ENCOUNTER — Encounter (HOSPITAL_COMMUNITY): Payer: Self-pay | Admitting: Pulmonary Disease

## 2020-03-07 LAB — MAGNESIUM: Magnesium: 2 mg/dL (ref 1.7–2.4)

## 2020-03-07 LAB — BASIC METABOLIC PANEL
Anion gap: 6 (ref 5–15)
BUN: 21 mg/dL — ABNORMAL HIGH (ref 6–20)
CO2: 24 mmol/L (ref 22–32)
Calcium: 7.7 mg/dL — ABNORMAL LOW (ref 8.9–10.3)
Chloride: 107 mmol/L (ref 98–111)
Creatinine, Ser: 1.3 mg/dL — ABNORMAL HIGH (ref 0.61–1.24)
GFR calc Af Amer: >60 mL/min (ref >60)
GFR calc non Af Amer: >60 mL/min (ref >60)
Glucose, Bld: 164 mg/dL — ABNORMAL HIGH (ref 70–99)
Potassium: 3.4 mmol/L — ABNORMAL LOW (ref 3.5–5.1)
Sodium: 137 mmol/L (ref 135–145)

## 2020-03-07 LAB — COOXEMETRY PANEL
Carboxyhemoglobin: 1.2 % (ref 0.5–1.5)
Methemoglobin: 1.1 % (ref 0.0–1.5)
O2 Saturation: 62.1 %
Total hemoglobin: 13.8 g/dL (ref 12.0–16.0)

## 2020-03-07 LAB — PROCALCITONIN: Procalcitonin: <0.1 ng/mL

## 2020-03-07 MED ORDER — FUROSEMIDE 40 MG PO TABS
40.0000 mg | ORAL_TABLET | Freq: Every day | ORAL | Status: DC
  Administered 2020-03-07 – 2020-03-09 (×3): 40 mg via ORAL
  Filled 2020-03-07 (×3): qty 1

## 2020-03-07 MED ORDER — POTASSIUM CHLORIDE CRYS ER 20 MEQ PO TBCR
30.0000 meq | EXTENDED_RELEASE_TABLET | Freq: Once | ORAL | Status: AC
  Administered 2020-03-07: 30 meq via ORAL
  Filled 2020-03-07: qty 1

## 2020-03-07 MED ORDER — SPIRONOLACTONE 25 MG PO TABS
25.0000 mg | ORAL_TABLET | Freq: Every day | ORAL | Status: DC
  Administered 2020-03-07 – 2020-03-11 (×5): 25 mg via ORAL
  Filled 2020-03-07 (×5): qty 1

## 2020-03-07 NOTE — Progress Notes (Addendum)
Advanced Heart Failure Rounding Note  PCP-Cardiologist: No primary care provider on file.   Subjective:   Admitted with cardiogenic shock. Placed on norepi and diuresed with IV lasix.  6/10 Milrinone started and increased to 0.375 mcg + norepi 11 mcg. Diuresed with IV lasix.   Todays CO-OX is 62%.   ECHO EF 20-25% RV normal. Severe MR  Feeling better. Denies SOB.    Objective:   Weight Range: 79.9 kg Body mass index is 27.59 kg/m.   Vital Signs:   Temp:  [97.5 F (36.4 C)-98.4 F (36.9 C)] 97.5 F (36.4 C) (06/11 0416) Pulse Rate:  [31-117] 105 (06/11 0200) Resp:  [11-34] 18 (06/11 0200) BP: (86-142)/(55-99) 108/78 (06/11 0200) SpO2:  [95 %-100 %] 100 % (06/11 0200) Last BM Date: 03/04/20  Weight change: Filed Weights   03/04/20 2209 03/06/20 0400  Weight: 73.5 kg 79.9 kg    Intake/Output:   Intake/Output Summary (Last 24 hours) at 03/07/2020 0601 Last data filed at 03/06/2020 2300 Gross per 24 hour  Intake 609.75 ml  Output 5275 ml  Net -4665.25 ml      Physical Exam  CVP 6 personally checked.   General:  No resp difficulty HEENT: normal Neck: supple. JVP 5-6 . Carotids 2+ bilat; no bruits. No lymphadenopathy or thryomegaly appreciated. Cor: PMI nondisplaced. Regular rate & rhythm. No rubs, or murmurs. +S3  Lungs: clear Abdomen: soft, nontender, nondistended. No hepatosplenomegaly. No bruits or masses. Good bowel sounds. Extremities: no cyanosis, clubbing, rash, edema. RUE PICC  Neuro: alert & orientedx3, cranial nerves grossly intact. moves all 4 extremities w/o difficulty. Affect pleasant   Telemetry   ST with frequent PVCs.   EKG    N/a   Labs    CBC Recent Labs    03/04/20 2224 03/04/20 2224 03/05/20 0242 03/06/20 0533  WBC 16.3*   < > 13.9* 12.9*  NEUTROABS 13.7*  --   --  10.4*  HGB 13.6   < > 12.6* 12.8*  HCT 42.7   < > 38.8* 39.5  MCV 87.1   < > 87.2 84.6  PLT 197   < > 206 203   < > = values in this interval not  displayed.   Basic Metabolic Panel Recent Labs    37/10/62 2224 03/04/20 2224 03/04/20 2340 03/05/20 0242 03/05/20 0940 03/06/20 0420  NA 132*  --   --   --   --  138  K 6.0*   < > 5.7*  --   --  4.1  CL 104  --   --   --   --  107  CO2 17*  --   --   --   --  21*  GLUCOSE 154*  --   --   --   --  130*  BUN 25*  --   --   --   --  29*  CREATININE 1.66*   < >  --  1.46*  --  1.54*  CALCIUM 8.2*  --   --   --   --  7.9*  MG  --   --   --   --  1.8 2.3  PHOS  --   --   --   --   --  3.1   < > = values in this interval not displayed.   Liver Function Tests Recent Labs    03/06/20 0420  AST 148*  ALT 346*  ALKPHOS 52  BILITOT 0.9  PROT 4.9*  ALBUMIN 3.0*   No results for input(s): LIPASE, AMYLASE in the last 72 hours. Cardiac Enzymes No results for input(s): CKTOTAL, CKMB, CKMBINDEX, TROPONINI in the last 72 hours.  BNP: BNP (last 3 results) Recent Labs    03/04/20 2224  BNP 1,300.0*    ProBNP (last 3 results) No results for input(s): PROBNP in the last 8760 hours.   D-Dimer No results for input(s): DDIMER in the last 72 hours. Hemoglobin A1C No results for input(s): HGBA1C in the last 72 hours. Fasting Lipid Panel Recent Labs    03/06/20 0420  CHOL 179  HDL 42  LDLCALC 119*  TRIG 91  CHOLHDL 4.3   Thyroid Function Tests Recent Labs    03/05/20 0043  TSH 2.396    Other results:   Imaging    No results found.   Medications:     Scheduled Medications: . aspirin EC  81 mg Oral Daily  . Chlorhexidine Gluconate Cloth  6 each Topical Daily  . digoxin  0.125 mg Oral Daily  . enoxaparin (LOVENOX) injection  40 mg Subcutaneous Q24H  . furosemide  80 mg Intravenous BID  . sodium chloride flush  10-40 mL Intracatheter Q12H  . spironolactone  12.5 mg Oral QHS    Infusions: . sodium chloride    . amiodarone 30 mg/hr (03/07/20 0410)  . milrinone 0.375 mcg/kg/min (03/06/20 2345)  . norepinephrine (LEVOPHED) Adult infusion 11 mcg/min  (03/07/20 0121)    PRN Medications: docusate sodium, polyethylene glycol, sodium chloride flush     Assessment/Plan   1. A/C Systolic HF-->Cardiogenic Shock  - Has known ICM/NIMC 1V CAD with chronically occluded RCA/familial brother/dad heart transplant.   -Presented in cardiogenic shock suspect due to high dose hf meds started on 6/7.  -Volume status elevated and with S3. Check ECHO  -Lactic Acid 3.5>2.1  - On 6/10 CO-OX remained low. Started on milrinone and increased to 0.375 mcg  + norepi was increased to 11 mcg.  - Todays CO-OX is 62%.  - Volume status much improved. Stop IV lasix. Start lasix 40 mg po daily.  - Start to wean norepi. Continue current dose milrinone.  - No bb with shock  - No spiro/arb with hyperkalemia  - Continue digoxin 0.125 mg daily.   - Will need cath once tuned up.  - ECHO EF 20-25% severe MR.    2. Lactic Acidosis 3.5> 2.1  3. Hyperkalemia  K 6 on admit -->3.4  4. ID  -WBC 16 on admit-->12.9  -Blood Cx - NGTD -Started on cefepime + vancomycin  - De-escalate antibiotics.   5. CAD  -1V chronically occluded RCA -Continue asa 81 mg daily.  - Add statin after shock resolves.   6. Suspect AKI in the setting of shock  Creatinine 1.7 on admit.  - Todays creatinine pending.   7. Elevated LFTs -Suspect in the setting of shock.  - Follow LFTs  8. MR, severe  Noted on ECHO   9. PVCs Continue amio drip while on pressors.High PVC burden.   BMET pending.   OOB to chair.   Length of Stay: 2  Amy Clegg, NP  03/07/2020, 6:01 AM  Advanced Heart Failure Team Pager [phone] (M-F; 7a - 4p)  Please contact [facility] for night-coverage after hours (4p -7a ) and weekends on amion  Agree.  He is now on dual pressors with NE 11 and milrinone 0.375. Co-ox up to 62%. Volume status down with CVP 6. AKI resolving.  General:  Well appearing. No resp difficulty HEENT: normal Neck: supple. no JVD. Carotids 2+ bilat; no bruits. No  lymphadenopathy or thryomegaly appreciated. Cor: PMI nondisplaced. Regular rate & rhythm. No rubs, gallops or murmurs. Lungs: clear Abdomen: soft, nontender, nondistended. No hepatosplenomegaly. No bruits or masses. Good bowel sounds. Extremities: no cyanosis, clubbing, rash, edema Neuro: alert & orientedx3, cranial nerves grossly intact. moves all 4 extremities w/o difficulty. Affect pleasant  Shock resolving on dual pressors. Will wean NE slowly. Continue milrinone. Begin to titrate GDMT as tolerated. Switch IV lasix to po. Supp K   CRITICAL CARE Performed by: Arvilla Meres  Total critical care time: 35 minutes  Critical care time was exclusive of separately billable procedures and treating other patients.  Critical care was necessary to treat or prevent imminent or life-threatening deterioration.  Critical care was time spent personally by me (independent of midlevel providers or residents) on the following activities: development of treatment plan with patient and/or surrogate as well as nursing, discussions with consultants, evaluation of patient's response to treatment, examination of patient, obtaining history from patient or surrogate, ordering and performing treatments and interventions, ordering and review of laboratory studies, ordering and review of radiographic studies, pulse oximetry and re-evaluation of patient's condition.   Arvilla Meres, MD  8:51 AM

## 2020-03-07 NOTE — Plan of Care (Signed)

## 2020-03-08 ENCOUNTER — Encounter (HOSPITAL_COMMUNITY): Payer: Self-pay | Admitting: Pulmonary Disease

## 2020-03-08 LAB — BASIC METABOLIC PANEL
Anion gap: 12 (ref 5–15)
BUN: 20 mg/dL (ref 6–20)
CO2: 25 mmol/L (ref 22–32)
Calcium: 8.2 mg/dL — ABNORMAL LOW (ref 8.9–10.3)
Chloride: 100 mmol/L (ref 98–111)
Creatinine, Ser: 1.21 mg/dL (ref 0.61–1.24)
GFR calc Af Amer: >60 mL/min (ref >60)
GFR calc non Af Amer: >60 mL/min (ref >60)
Glucose, Bld: 235 mg/dL — ABNORMAL HIGH (ref 70–99)
Potassium: 3.4 mmol/L — ABNORMAL LOW (ref 3.5–5.1)
Sodium: 137 mmol/L (ref 135–145)

## 2020-03-08 LAB — COOXEMETRY PANEL
Carboxyhemoglobin: 0.9 % (ref 0.5–1.5)
Methemoglobin: 0.9 % (ref 0.0–1.5)
O2 Saturation: 54.7 %
Total hemoglobin: 11.9 g/dL — ABNORMAL LOW (ref 12.0–16.0)

## 2020-03-08 MED ORDER — POTASSIUM CHLORIDE CRYS ER 20 MEQ PO TBCR
30.0000 meq | EXTENDED_RELEASE_TABLET | Freq: Once | ORAL | Status: AC
  Administered 2020-03-08: 30 meq via ORAL
  Filled 2020-03-08: qty 1

## 2020-03-08 MED ORDER — LOSARTAN POTASSIUM 25 MG PO TABS
25.0000 mg | ORAL_TABLET | Freq: Every day | ORAL | Status: DC
  Administered 2020-03-08 – 2020-03-10 (×3): 25 mg via ORAL
  Filled 2020-03-08 (×3): qty 1

## 2020-03-08 MED ORDER — ATORVASTATIN CALCIUM 40 MG PO TABS
40.0000 mg | ORAL_TABLET | Freq: Every day | ORAL | Status: DC
  Administered 2020-03-08 – 2020-03-13 (×6): 40 mg via ORAL
  Filled 2020-03-08 (×6): qty 1

## 2020-03-08 NOTE — Plan of Care (Signed)

## 2020-03-08 NOTE — Progress Notes (Signed)
Advanced Heart Failure Rounding Note  PCP-Cardiologist: No primary care provider on file.   Subjective:    NE titrated off. On milrinone 0.375  Todays CO-OX 62% -> 55%.   ECHO EF 20-25% RV normal. Severe MR  On oral lasix. Weight stable. CVP 6-7. Creatinine 1.3 -> 1.2  Denies SOB, orthopnea or PND   Objective:   Weight Range: 75.3 kg Body mass index is 26 kg/m.   Vital Signs:   Temp:  [97.5 F (36.4 C)-98.6 F (37 C)] 98.6 F (37 C) (06/12 0730) Pulse Rate:  [45-115] 93 (06/12 1000) Resp:  [12-30] 22 (06/12 1000) BP: (92-139)/(62-95) 107/76 (06/12 1000) SpO2:  [90 %-100 %] 96 % (06/12 1000) Weight:  [75.3 kg] 75.3 kg (06/12 0400) Last BM Date: 03/07/20  Weight change: Filed Weights   03/06/20 0400 03/07/20 0700 03/08/20 0400  Weight: 79.9 kg 75.4 kg 75.3 kg    Intake/Output:   Intake/Output Summary (Last 24 hours) at 03/08/2020 1109 Last data filed at 03/08/2020 0700 Gross per 24 hour  Intake 709.95 ml  Output 2125 ml  Net -1415.05 ml      Physical Exam  CVP 4-5 personally checked.   General:  No resp difficulty HEENT: normal Neck: supple. JVP flat. Carotids 2+ bilat; no bruits. No lymphadenopathy or thryomegaly appreciated. Cor: PMI nondisplaced. Regular rate & rhythm. No rubs, or murmurs. +S3  Lungs: clear Abdomen: soft, nontender, nondistended. No hepatosplenomegaly. No bruits or masses. Good bowel sounds. Extremities: no cyanosis, clubbing, rash, edema. RUE PICC  Neuro: alert & orientedx3, cranial nerves grossly intact. moves all 4 extremities w/o difficulty. Affect pleasant   Telemetry   ST with frequent PVCs.   EKG    N/a   Labs    CBC Recent Labs    03/06/20 0533  WBC 12.9*  NEUTROABS 10.4*  HGB 12.8*  HCT 39.5  MCV 84.6  PLT 203   Basic Metabolic Panel Recent Labs    03/22/93 0420 03/06/20 0420 03/07/20 0420 03/08/20 0500  NA 138   < > 137 137  K 4.1   < > 3.4* 3.4*  CL 107   < > 107 100  CO2 21*   < > 24 25   GLUCOSE 130*   < > 164* 235*  BUN 29*   < > 21* 20  CREATININE 1.54*   < > 1.30* 1.21  CALCIUM 7.9*   < > 7.7* 8.2*  MG 2.3  --  2.0  --   PHOS 3.1  --   --   --    < > = values in this interval not displayed.   Liver Function Tests Recent Labs    03/06/20 0420  AST 148*  ALT 346*  ALKPHOS 52  BILITOT 0.9  PROT 4.9*  ALBUMIN 3.0*   No results for input(s): LIPASE, AMYLASE in the last 72 hours. Cardiac Enzymes No results for input(s): CKTOTAL, CKMB, CKMBINDEX, TROPONINI in the last 72 hours.  BNP: BNP (last 3 results) Recent Labs    03/04/20 2224  BNP 1,300.0*    ProBNP (last 3 results) No results for input(s): PROBNP in the last 8760 hours.   D-Dimer No results for input(s): DDIMER in the last 72 hours. Hemoglobin A1C No results for input(s): HGBA1C in the last 72 hours. Fasting Lipid Panel Recent Labs    03/06/20 0420  CHOL 179  HDL 42  LDLCALC 119*  TRIG 91  CHOLHDL 4.3   Thyroid Function Tests No results  for input(s): TSH, T4TOTAL, T3FREE, THYROIDAB in the last 72 hours.  Invalid input(s): FREET3  Other results:   Imaging    No results found.   Medications:     Scheduled Medications:  aspirin EC  81 mg Oral Daily   Chlorhexidine Gluconate Cloth  6 each Topical Daily   digoxin  0.125 mg Oral Daily   enoxaparin (LOVENOX) injection  40 mg Subcutaneous Q24H   furosemide  40 mg Oral Daily   sodium chloride flush  10-40 mL Intracatheter Q12H   spironolactone  25 mg Oral QHS    Infusions:  sodium chloride     amiodarone 30 mg/hr (03/08/20 0800)   milrinone 0.375 mcg/kg/min (03/08/20 1008)   norepinephrine (LEVOPHED) Adult infusion Stopped (03/07/20 1832)    PRN Medications: docusate sodium, polyethylene glycol, sodium chloride flush     Assessment/Plan   1. A/C Systolic HF-->Cardiogenic Shock  - Has known ICM/NIMC 1V CAD with chronically occluded RCA/familial brother/dad heart transplant.   -Presented in  cardiogenic shock  --ECHO EF 20-25% severe MR.   - On 6/10 CO-OX remained low. Started on milrinone and increased to 0.375 mcg  + norepi was increased to 11 mcg.  - This am NE off. Remains on milrinone 0.375 - Co-ox 62%-> 55% - On dig/spiro - Add losartan - Follow co-ox closely - Likely R/L cath Monday  2. Hyperkalemia/hypokalemia - k 3.4. supp   3. CAD  -1V chronically occluded RCA -Continue asa 81 mg daily. Add statin - cath monday  4. Suspect AKI in the setting of shock  - Creatinine 1.7 on admit.  - resolved  7. Elevated LFTs -Suspect in the setting of shock.  - Follow LFTs  8. MR, severe  - likely functional   9. PVCs - continue amio    Length of Stay: 3  Glori Bickers, MD  03/08/2020, 11:09 AM  Advanced Heart Failure Team Pager [phone] (M-F; 7a - 4p)  Please contact [facility] for night-coverage after hours (4p -7a ) and weekends on amion  Agree.  He is now on dual pressors with NE 11 and milrinone 0.375. Co-ox up to 62%. Volume status down with CVP 6. AKI resolving.   General:  Well appearing. No resp difficulty HEENT: normal Neck: supple. no JVD. Carotids 2+ bilat; no bruits. No lymphadenopathy or thryomegaly appreciated. Cor: PMI nondisplaced. Regular rate & rhythm. No rubs, gallops or murmurs. Lungs: clear Abdomen: soft, nontender, nondistended. No hepatosplenomegaly. No bruits or masses. Good bowel sounds. Extremities: no cyanosis, clubbing, rash, edema Neuro: alert & orientedx3, cranial nerves grossly intact. moves all 4 extremities w/o difficulty. Affect pleasant  Shock resolving on dual pressors. Will wean NE slowly. Continue milrinone. Begin to titrate GDMT as tolerated. Switch IV lasix to po. Supp K   CRITICAL CARE Performed by: Glori Bickers  Total critical care time: 35 minutes  Critical care time was exclusive of separately billable procedures and treating other patients.  Critical care was necessary to treat or prevent  imminent or life-threatening deterioration.  Critical care was time spent personally by me (independent of midlevel providers or residents) on the following activities: development of treatment plan with patient and/or surrogate as well as nursing, discussions with consultants, evaluation of patient's response to treatment, examination of patient, obtaining history from patient or surrogate, ordering and performing treatments and interventions, ordering and review of laboratory studies, ordering and review of radiographic studies, pulse oximetry and re-evaluation of patient's condition.   Glori Bickers, MD  11:09 AM

## 2020-03-09 LAB — BASIC METABOLIC PANEL
Anion gap: 10 (ref 5–15)
BUN: 16 mg/dL (ref 6–20)
CO2: 27 mmol/L (ref 22–32)
Calcium: 8.6 mg/dL — ABNORMAL LOW (ref 8.9–10.3)
Chloride: 100 mmol/L (ref 98–111)
Creatinine, Ser: 1.08 mg/dL (ref 0.61–1.24)
GFR calc Af Amer: >60 mL/min (ref >60)
GFR calc non Af Amer: >60 mL/min (ref >60)
Glucose, Bld: 169 mg/dL — ABNORMAL HIGH (ref 70–99)
Potassium: 3.6 mmol/L (ref 3.5–5.1)
Sodium: 137 mmol/L (ref 135–145)

## 2020-03-09 LAB — CULTURE, BLOOD (ROUTINE X 2)
Culture: NO GROWTH
Culture: NO GROWTH
Special Requests: ADEQUATE
Special Requests: ADEQUATE

## 2020-03-09 LAB — COOXEMETRY PANEL
Carboxyhemoglobin: 1.4 % (ref 0.5–1.5)
Methemoglobin: 1 % (ref 0.0–1.5)
O2 Saturation: 59.5 %
Total hemoglobin: 14.3 g/dL (ref 12.0–16.0)

## 2020-03-09 MED ORDER — SODIUM CHLORIDE 0.9 % IV SOLN: 250.0000 mL | INTRAVENOUS | Status: DC | PRN

## 2020-03-09 MED ORDER — SODIUM CHLORIDE 0.9% FLUSH
3.0000 mL | Freq: Two times a day (BID) | INTRAVENOUS | Status: DC
  Administered 2020-03-09: 3 mL via INTRAVENOUS

## 2020-03-09 MED ORDER — ASPIRIN 81 MG PO CHEW
81.0000 mg | CHEWABLE_TABLET | ORAL | Status: AC
  Administered 2020-03-10: 81 mg via ORAL
  Filled 2020-03-09: qty 1

## 2020-03-09 MED ORDER — SODIUM CHLORIDE 0.9 % IV SOLN: INTRAVENOUS | Status: DC

## 2020-03-09 MED ORDER — POTASSIUM CHLORIDE CRYS ER 20 MEQ PO TBCR
40.0000 meq | EXTENDED_RELEASE_TABLET | Freq: Once | ORAL | Status: AC
  Administered 2020-03-09: 40 meq via ORAL
  Filled 2020-03-09: qty 2

## 2020-03-09 MED ORDER — SODIUM CHLORIDE 0.9% FLUSH: 3.0000 mL | INTRAVENOUS | Status: DC | PRN

## 2020-03-09 MED ORDER — MILRINONE LACTATE IN DEXTROSE 20-5 MG/100ML-% IV SOLN
0.2500 ug/kg/min | INTRAVENOUS | Status: DC
  Administered 2020-03-09 – 2020-03-10 (×2): 0.25 ug/kg/min via INTRAVENOUS
  Filled 2020-03-09: qty 100

## 2020-03-09 NOTE — Progress Notes (Signed)
While assisting patient to chair, noticed left ac iv had infiltrated. Immediately stopped medication (cangrelor and heparin), notified pharmacy and iv team. Aspirated as much as I could from the iv. Patient denies any pain, initially was swollen ,but not now. Continue to  Monitor.Melodye Ped

## 2020-03-09 NOTE — Progress Notes (Signed)
  Advanced Heart Failure Rounding Note  PCP-Cardiologist: No primary care provider on file.   Subjective:    Off NE On milrinone 0.375  Todays CO-OX 62% -> 55%.-> 60%   ECHO EF 20-25% RV normal. Severe MR  On oral lasix. Weight stable down 4 pounds. CVP 3-4. Creatinine improved.   Denies SOB, orthopnea or PND.    Objective:   Weight Range: 73.9 kg Body mass index is 25.52 kg/m.   Vital Signs:   Temp:  [97.4 F (36.3 C)-98.7 F (37.1 C)] 98.7 F (37.1 C) (06/13 0736) Pulse Rate:  [79-111] 90 (06/13 0924) Resp:  [12-24] 18 (06/13 0924) BP: (96-127)/(66-94) 96/67 (06/13 0924) SpO2:  [93 %-100 %] 100 % (06/13 0924) Weight:  [73.9 kg] 73.9 kg (06/13 0500) Last BM Date: 03/08/20  Weight change: Filed Weights   03/07/20 0700 03/08/20 0400 03/09/20 0500  Weight: 75.4 kg 75.3 kg 73.9 kg    Intake/Output:   Intake/Output Summary (Last 24 hours) at 03/09/2020 0930 Last data filed at 03/09/2020 0500 Gross per 24 hour  Intake 1162.94 ml  Output 2850 ml  Net -1687.06 ml      Physical Exam   General:  Sitting up in chair No resp difficulty HEENT: normal Neck: supple. no JVD. Carotids 2+ bilat; no bruits. No lymphadenopathy or thryomegaly appreciated. Cor: PMI nondisplaced. Regular rate & rhythm. No rubs, gallops or murmurs. Lungs: clear Abdomen: soft, nontender, nondistended. No hepatosplenomegaly. No bruits or masses. Good bowel sounds. Extremities: no cyanosis, clubbing, rash, edema Neuro: alert & orientedx3, cranial nerves grossly intact. moves all 4 extremities w/o difficulty. Affect pleasant   Telemetry   Sinus 90s Personally reviewed  Labs    CBC No results for input(s): WBC, NEUTROABS, HGB, HCT, MCV, PLT in the last 72 hours. Basic Metabolic Panel Recent Labs    03/07/20 0420 03/07/20 0420 03/08/20 0500 03/09/20 0345  NA 137   < > 137 137  K 3.4*   < > 3.4* 3.6  CL 107   < > 100 100  CO2 24   < > 25 27  GLUCOSE 164*   < > 235* 169*  BUN  21*   < > 20 16  CREATININE 1.30*   < > 1.21 1.08  CALCIUM 7.7*   < > 8.2* 8.6*  MG 2.0  --   --   --    < > = values in this interval not displayed.   Liver Function Tests No results for input(s): AST, ALT, ALKPHOS, BILITOT, PROT, ALBUMIN in the last 72 hours. No results for input(s): LIPASE, AMYLASE in the last 72 hours. Cardiac Enzymes No results for input(s): CKTOTAL, CKMB, CKMBINDEX, TROPONINI in the last 72 hours.  BNP: BNP (last 3 results) Recent Labs    03/04/20 2224  BNP 1,300.0*    ProBNP (last 3 results) No results for input(s): PROBNP in the last 8760 hours.   D-Dimer No results for input(s): DDIMER in the last 72 hours. Hemoglobin A1C No results for input(s): HGBA1C in the last 72 hours. Fasting Lipid Panel No results for input(s): CHOL, HDL, LDLCALC, TRIG, CHOLHDL, LDLDIRECT in the last 72 hours. Thyroid Function Tests No results for input(s): TSH, T4TOTAL, T3FREE, THYROIDAB in the last 72 hours.  Invalid input(s): FREET3  Other results:   Imaging    No results found.   Medications:     Scheduled Medications: . aspirin EC  81 mg Oral Daily  . atorvastatin  40 mg Oral Daily  .   Chlorhexidine Gluconate Cloth  6 each Topical Daily  . digoxin  0.125 mg Oral Daily  . enoxaparin (LOVENOX) injection  40 mg Subcutaneous Q24H  . furosemide  40 mg Oral Daily  . losartan  25 mg Oral Daily  . sodium chloride flush  10-40 mL Intracatheter Q12H  . spironolactone  25 mg Oral QHS    Infusions: . sodium chloride    . amiodarone 30 mg/hr (03/09/20 0817)  . milrinone 0.375 mcg/kg/min (03/09/20 0818)    PRN Medications: docusate sodium, polyethylene glycol, sodium chloride flush     Assessment/Plan   1. A/C Systolic HF-->Cardiogenic Shock  - Has known ICM/NIMC 1V CAD with chronically occluded RCA/familial brother/dad heart transplant.   -Presented in cardiogenic shock  --ECHO EF 20-25% severe MR.   - On 6/10 CO-OX remained low. Started on  milrinone and increased to 0.375 mcg  + norepi was increased to 11 mcg.  - This am NE off. Remains on milrinone 0.375 - Co-ox 62%-> 55% -> 60% - Drop milrinone to 0.25 - On dig/spiro/losartan. BP too low too titrate - No b-blocker yet with shock - Follow co-ox closely - R/L cath tomorrow  2. Hyperkalemia/hypokalemia - k 3.6. supp   3. CAD  -1V chronically occluded RCA -Continue asa 81 mg daily. Add statin - cath in am   4. Suspect AKI in the setting of shock  - Creatinine 1.7 on admit.  - resolved  7. Elevated LFTs -Suspect in the setting of shock.  - Follow LFTs  8. MR, severe  - likely functional   9. PVCs - continue amio - keep K> 4.0 Mg > 2.0  Length of Stay: 4  Niki Cosman, MD  03/09/2020, 9:30 AM  Advanced Heart Failure Team Pager [phone] (M-F; 7a - 4p)  Please contact [facility] for night-coverage after hours (4p -7a ) and weekends on amion   

## 2020-03-09 NOTE — H&P (View-Only) (Signed)
Advanced Heart Failure Rounding Note  PCP-Cardiologist: No primary care provider on file.   Subjective:    Off NE On milrinone 0.375  Todays CO-OX 62% -> 55%.-> 60%   ECHO EF 20-25% RV normal. Severe MR  On oral lasix. Weight stable down 4 pounds. CVP 3-4. Creatinine improved.   Denies SOB, orthopnea or PND.    Objective:   Weight Range: 73.9 kg Body mass index is 25.52 kg/m.   Vital Signs:   Temp:  [97.4 F (36.3 C)-98.7 F (37.1 C)] 98.7 F (37.1 C) (06/13 0736) Pulse Rate:  [79-111] 90 (06/13 0924) Resp:  [12-24] 18 (06/13 0924) BP: (96-127)/(66-94) 96/67 (06/13 0924) SpO2:  [93 %-100 %] 100 % (06/13 0924) Weight:  [73.9 kg] 73.9 kg (06/13 0500) Last BM Date: 03/08/20  Weight change: Filed Weights   03/07/20 0700 03/08/20 0400 03/09/20 0500  Weight: 75.4 kg 75.3 kg 73.9 kg    Intake/Output:   Intake/Output Summary (Last 24 hours) at 03/09/2020 0930 Last data filed at 03/09/2020 0500 Gross per 24 hour  Intake 1162.94 ml  Output 2850 ml  Net -1687.06 ml      Physical Exam   General:  Sitting up in chair No resp difficulty HEENT: normal Neck: supple. no JVD. Carotids 2+ bilat; no bruits. No lymphadenopathy or thryomegaly appreciated. Cor: PMI nondisplaced. Regular rate & rhythm. No rubs, gallops or murmurs. Lungs: clear Abdomen: soft, nontender, nondistended. No hepatosplenomegaly. No bruits or masses. Good bowel sounds. Extremities: no cyanosis, clubbing, rash, edema Neuro: alert & orientedx3, cranial nerves grossly intact. moves all 4 extremities w/o difficulty. Affect pleasant   Telemetry   Sinus 90s Personally reviewed  Labs    CBC No results for input(s): WBC, NEUTROABS, HGB, HCT, MCV, PLT in the last 72 hours. Basic Metabolic Panel Recent Labs    39/76/73 0420 03/07/20 0420 03/08/20 0500 03/09/20 0345  NA 137   < > 137 137  K 3.4*   < > 3.4* 3.6  CL 107   < > 100 100  CO2 24   < > 25 27  GLUCOSE 164*   < > 235* 169*  BUN  21*   < > 20 16  CREATININE 1.30*   < > 1.21 1.08  CALCIUM 7.7*   < > 8.2* 8.6*  MG 2.0  --   --   --    < > = values in this interval not displayed.   Liver Function Tests No results for input(s): AST, ALT, ALKPHOS, BILITOT, PROT, ALBUMIN in the last 72 hours. No results for input(s): LIPASE, AMYLASE in the last 72 hours. Cardiac Enzymes No results for input(s): CKTOTAL, CKMB, CKMBINDEX, TROPONINI in the last 72 hours.  BNP: BNP (last 3 results) Recent Labs    03/04/20 2224  BNP 1,300.0*    ProBNP (last 3 results) No results for input(s): PROBNP in the last 8760 hours.   D-Dimer No results for input(s): DDIMER in the last 72 hours. Hemoglobin A1C No results for input(s): HGBA1C in the last 72 hours. Fasting Lipid Panel No results for input(s): CHOL, HDL, LDLCALC, TRIG, CHOLHDL, LDLDIRECT in the last 72 hours. Thyroid Function Tests No results for input(s): TSH, T4TOTAL, T3FREE, THYROIDAB in the last 72 hours.  Invalid input(s): FREET3  Other results:   Imaging    No results found.   Medications:     Scheduled Medications: . aspirin EC  81 mg Oral Daily  . atorvastatin  40 mg Oral Daily  .  Chlorhexidine Gluconate Cloth  6 each Topical Daily  . digoxin  0.125 mg Oral Daily  . enoxaparin (LOVENOX) injection  40 mg Subcutaneous Q24H  . furosemide  40 mg Oral Daily  . losartan  25 mg Oral Daily  . sodium chloride flush  10-40 mL Intracatheter Q12H  . spironolactone  25 mg Oral QHS    Infusions: . sodium chloride    . amiodarone 30 mg/hr (03/09/20 0817)  . milrinone 0.375 mcg/kg/min (03/09/20 0818)    PRN Medications: docusate sodium, polyethylene glycol, sodium chloride flush     Assessment/Plan   1. A/C Systolic HF-->Cardiogenic Shock  - Has known ICM/NIMC 1V CAD with chronically occluded RCA/familial brother/dad heart transplant.   -Presented in cardiogenic shock  --ECHO EF 20-25% severe MR.   - On 6/10 CO-OX remained low. Started on  milrinone and increased to 0.375 mcg  + norepi was increased to 11 mcg.  - This am NE off. Remains on milrinone 0.375 - Co-ox 62%-> 55% -> 60% - Drop milrinone to 0.25 - On dig/spiro/losartan. BP too low too titrate - No b-blocker yet with shock - Follow co-ox closely - R/L cath tomorrow  2. Hyperkalemia/hypokalemia - k 3.6. supp   3. CAD  -1V chronically occluded RCA -Continue asa 81 mg daily. Add statin - cath in am   4. Suspect AKI in the setting of shock  - Creatinine 1.7 on admit.  - resolved  7. Elevated LFTs -Suspect in the setting of shock.  - Follow LFTs  8. MR, severe  - likely functional   9. PVCs - continue amio - keep K> 4.0 Mg > 2.0  Length of Stay: [place], MD  03/09/2020, 9:30 AM  Advanced Heart Failure Team Pager [phone] (M-F; 7a - 4p)  Please contact [facility] for night-coverage after hours (4p -7a ) and weekends on amion

## 2020-03-10 ENCOUNTER — Inpatient Hospital Stay (HOSPITAL_COMMUNITY)
Admission: EM | Disposition: A | Discharge status: Home / Self Care | Payer: Self-pay | Source: Home / Self Care | Attending: Internal Medicine

## 2020-03-10 ENCOUNTER — Encounter (HOSPITAL_COMMUNITY): Payer: Self-pay | Admitting: Internal Medicine

## 2020-03-10 HISTORY — PX: RIGHT/LEFT HEART CATH AND CORONARY ANGIOGRAPHY: CATH118266

## 2020-03-10 LAB — BASIC METABOLIC PANEL
Anion gap: 10 (ref 5–15)
BUN: 14 mg/dL (ref 6–20)
CO2: 27 mmol/L (ref 22–32)
Calcium: 9.2 mg/dL (ref 8.9–10.3)
Chloride: 102 mmol/L (ref 98–111)
Creatinine, Ser: 1.12 mg/dL (ref 0.61–1.24)
GFR calc Af Amer: >60 mL/min (ref >60)
GFR calc non Af Amer: >60 mL/min (ref >60)
Glucose, Bld: 112 mg/dL — ABNORMAL HIGH (ref 70–99)
Potassium: 4 mmol/L (ref 3.5–5.1)
Sodium: 139 mmol/L (ref 135–145)

## 2020-03-10 LAB — POCT I-STAT EG7
Acid-Base Excess: 1 mmol/L (ref 0.0–2.0)
Acid-Base Excess: 1 mmol/L (ref 0.0–2.0)
Acid-Base Excess: 3 mmol/L — ABNORMAL HIGH (ref 0.0–2.0)
Acid-Base Excess: 4 mmol/L — ABNORMAL HIGH (ref 0.0–2.0)
Bicarbonate: 25 mmol/L (ref 20.0–28.0)
Bicarbonate: 25.4 mmol/L (ref 20.0–28.0)
Bicarbonate: 27.4 mmol/L (ref 20.0–28.0)
Bicarbonate: 29 mmol/L — ABNORMAL HIGH (ref 20.0–28.0)
Calcium, Ion: 0.81 mmol/L — CL (ref 1.15–1.40)
Calcium, Ion: 1.04 mmol/L — ABNORMAL LOW (ref 1.15–1.40)
Calcium, Ion: 1.05 mmol/L — ABNORMAL LOW (ref 1.15–1.40)
Calcium, Ion: 1.25 mmol/L (ref 1.15–1.40)
HCT: 35 % — ABNORMAL LOW (ref 39.0–52.0)
HCT: 40 % (ref 39.0–52.0)
HCT: 40 % (ref 39.0–52.0)
HCT: 43 % (ref 39.0–52.0)
Hemoglobin: 11.9 g/dL — ABNORMAL LOW (ref 13.0–17.0)
Hemoglobin: 13.6 g/dL (ref 13.0–17.0)
Hemoglobin: 13.6 g/dL (ref 13.0–17.0)
Hemoglobin: 14.6 g/dL (ref 13.0–17.0)
O2 Saturation: 75 %
O2 Saturation: 77 %
O2 Saturation: 81 %
O2 Saturation: 95 %
Potassium: 3.4 mmol/L — ABNORMAL LOW (ref 3.5–5.1)
Potassium: 3.5 mmol/L (ref 3.5–5.1)
Potassium: 3.9 mmol/L (ref 3.5–5.1)
Potassium: 4.7 mmol/L (ref 3.5–5.1)
Sodium: 141 mmol/L (ref 135–145)
Sodium: 144 mmol/L (ref 135–145)
Sodium: 144 mmol/L (ref 135–145)
Sodium: 145 mmol/L (ref 135–145)
TCO2: 26 mmol/L (ref 22–32)
TCO2: 27 mmol/L (ref 22–32)
TCO2: 29 mmol/L (ref 22–32)
TCO2: 30 mmol/L (ref 22–32)
pCO2, Ven: 37.5 mmHg — ABNORMAL LOW (ref 44.0–60.0)
pCO2, Ven: 39.2 mmHg — ABNORMAL LOW (ref 44.0–60.0)
pCO2, Ven: 42.5 mmHg — ABNORMAL LOW (ref 44.0–60.0)
pCO2, Ven: 45.3 mmHg (ref 44.0–60.0)
pH, Ven: 7.414 (ref 7.250–7.430)
pH, Ven: 7.418 (ref 7.250–7.430)
pH, Ven: 7.421 (ref 7.250–7.430)
pH, Ven: 7.433 — ABNORMAL HIGH (ref 7.250–7.430)
pO2, Ven: 40 mmHg (ref 32.0–45.0)
pO2, Ven: 41 mmHg (ref 32.0–45.0)
pO2, Ven: 45 mmHg (ref 32.0–45.0)
pO2, Ven: 71 mmHg — ABNORMAL HIGH (ref 32.0–45.0)

## 2020-03-10 LAB — COOXEMETRY PANEL
Carboxyhemoglobin: 1.1 % (ref 0.5–1.5)
Methemoglobin: 0.6 % (ref 0.0–1.5)
O2 Saturation: 72.1 %
Total hemoglobin: 14.8 g/dL (ref 12.0–16.0)

## 2020-03-10 SURGERY — RIGHT/LEFT HEART CATH AND CORONARY ANGIOGRAPHY
Anesthesia: LOCAL

## 2020-03-10 MED ORDER — FENTANYL CITRATE (PF) 100 MCG/2ML IJ SOLN
INTRAMUSCULAR | Status: DC | PRN
  Administered 2020-03-10: 25 ug via INTRAVENOUS

## 2020-03-10 MED ORDER — HEPARIN SODIUM (PORCINE) 1000 UNIT/ML IJ SOLN
INTRAMUSCULAR | Status: AC
  Filled 2020-03-10: qty 1

## 2020-03-10 MED ORDER — MIDAZOLAM HCL 2 MG/2ML IJ SOLN
INTRAMUSCULAR | Status: AC
  Filled 2020-03-10: qty 2

## 2020-03-10 MED ORDER — VERAPAMIL HCL 2.5 MG/ML IV SOLN: INTRAVENOUS | Status: DC | PRN

## 2020-03-10 MED ORDER — MILRINONE LACTATE IN DEXTROSE 20-5 MG/100ML-% IV SOLN: 0.1250 ug/kg/min | INTRAVENOUS | Status: DC

## 2020-03-10 MED ORDER — VERAPAMIL HCL 2.5 MG/ML IV SOLN
INTRAVENOUS | Status: AC
  Filled 2020-03-10: qty 2

## 2020-03-10 MED ORDER — FENTANYL CITRATE (PF) 100 MCG/2ML IJ SOLN
INTRAMUSCULAR | Status: AC
  Filled 2020-03-10: qty 2

## 2020-03-10 MED ORDER — LIDOCAINE HCL (PF) 1 % IJ SOLN
INTRAMUSCULAR | Status: AC
  Filled 2020-03-10: qty 30

## 2020-03-10 MED ORDER — HEPARIN (PORCINE) IN NACL 1000-0.9 UT/500ML-% IV SOLN
INTRAVENOUS | Status: AC
  Filled 2020-03-10: qty 1000

## 2020-03-10 MED ORDER — MIDAZOLAM HCL 2 MG/2ML IJ SOLN
INTRAMUSCULAR | Status: DC | PRN
  Administered 2020-03-10: 2 mg via INTRAVENOUS

## 2020-03-10 MED ORDER — IOHEXOL 350 MG/ML SOLN
INTRAVENOUS | Status: DC | PRN
  Administered 2020-03-10: 50 mL

## 2020-03-10 MED ORDER — LIDOCAINE HCL (PF) 1 % IJ SOLN
INTRAMUSCULAR | Status: DC | PRN
  Administered 2020-03-10 (×2): 2 mL

## 2020-03-10 MED ORDER — HEPARIN (PORCINE) IN NACL 1000-0.9 UT/500ML-% IV SOLN
INTRAVENOUS | Status: DC | PRN
  Administered 2020-03-10 (×2): 500 mL

## 2020-03-10 SURGICAL SUPPLY — 9 items
CATH 5FR JL3.5 JR4 ANG PIG MP (CATHETERS) ×1 IMPLANT
CATH BALLN WEDGE 5F 110CM (CATHETERS) ×1 IMPLANT
DEVICE RAD COMP TR BAND LRG (VASCULAR PRODUCTS) ×1 IMPLANT
GLIDESHEATH SLEND SS 6F .021 (SHEATH) ×1 IMPLANT
GUIDEWIRE INQWIRE 1.5J.035X260 (WIRE) IMPLANT
INQWIRE 1.5J .035X260CM (WIRE) ×2
PACK CARDIAC CATHETERIZATION (CUSTOM PROCEDURE TRAY) ×2 IMPLANT
SHEATH GLIDE SLENDER 4/5FR (SHEATH) ×1 IMPLANT
TRANSDUCER W/STOPCOCK (MISCELLANEOUS) ×2 IMPLANT

## 2020-03-10 NOTE — Progress Notes (Addendum)
Advanced Heart Failure Rounding Note  PCP-Cardiologist: No primary care provider on file.   Subjective:    Off NE.  Weaning down milrinone, now at 0.25.   Co-ox stable at 72%.    ECHO EF 20-25% RV normal. Severe MR  Good UOP w/ PO Lasix, -4L out yesterday. SCr and K stable. CVP 2-3 (sitting up in chair).  Feels well. No complaints. Denies resting dyspnea, orthopnea/PND. No exertional dyspnea. Denies CP.    Objective:   Weight Range: 73.3 kg Body mass index is 25.31 kg/m.   Vital Signs:   Temp:  [97.8 F (36.6 C)-98.7 F (37.1 C)] 97.9 F (36.6 C) (06/14 0000) Pulse Rate:  [79-104] 81 (06/14 0200) Resp:  [12-29] 19 (06/14 0600) BP: (85-113)/(54-84) 104/74 (06/14 0500) SpO2:  [93 %-100 %] 99 % (06/14 0400) Weight:  [73.3 kg] 73.3 kg (06/14 0600) Last BM Date: 03/09/20  Weight change: Filed Weights   03/08/20 0400 03/09/20 0500 03/10/20 0600  Weight: 75.3 kg 73.9 kg 73.3 kg    Intake/Output:   Intake/Output Summary (Last 24 hours) at 03/10/2020 0717 Last data filed at 03/10/2020 0600 Gross per 24 hour  Intake 1537.73 ml  Output 3950 ml  Net -2412.27 ml      Physical Exam   PHYSICAL EXAM: CVP 2-3 (sitting up in chair) General:  Well appearing. No respiratory difficulty HEENT: normal Neck: supple. no JVD. Carotids 2+ bilat; no bruits. No lymphadenopathy or thyromegaly appreciated. Cor: PMI nondisplaced. Regular rate & rhythm. No rubs, gallops or murmurs. Lungs: clear Abdomen: soft, nontender, nondistended. No hepatosplenomegaly. No bruits or masses. Good bowel sounds. Extremities: no cyanosis, clubbing, rash, edema Neuro: alert & oriented x 3, cranial nerves grossly intact. moves all 4 extremities w/o difficulty. Affect pleasant.    Telemetry   NSR 90s, rare PVCs personally reviewed   Labs    CBC No results for input(s): WBC, NEUTROABS, HGB, HCT, MCV, PLT in the last 72 hours. Basic Metabolic Panel Recent Labs    03/09/20 0345  03/10/20 0402  NA 137 139  K 3.6 4.0  CL 100 102  CO2 27 27  GLUCOSE 169* 112*  BUN 16 14  CREATININE 1.08 1.12  CALCIUM 8.6* 9.2   Liver Function Tests No results for input(s): AST, ALT, ALKPHOS, BILITOT, PROT, ALBUMIN in the last 72 hours. No results for input(s): LIPASE, AMYLASE in the last 72 hours. Cardiac Enzymes No results for input(s): CKTOTAL, CKMB, CKMBINDEX, TROPONINI in the last 72 hours.  BNP: BNP (last 3 results) Recent Labs    03/04/20 2224  BNP 1,300.0*    ProBNP (last 3 results) No results for input(s): PROBNP in the last 8760 hours.   D-Dimer No results for input(s): DDIMER in the last 72 hours. Hemoglobin A1C No results for input(s): HGBA1C in the last 72 hours. Fasting Lipid Panel No results for input(s): CHOL, HDL, LDLCALC, TRIG, CHOLHDL, LDLDIRECT in the last 72 hours. Thyroid Function Tests No results for input(s): TSH, T4TOTAL, T3FREE, THYROIDAB in the last 72 hours.  Invalid input(s): FREET3  Other results:   Imaging    No results found.   Medications:     Scheduled Medications: . aspirin EC  81 mg Oral Daily  . atorvastatin  40 mg Oral Daily  . Chlorhexidine Gluconate Cloth  6 each Topical Daily  . digoxin  0.125 mg Oral Daily  . enoxaparin (LOVENOX) injection  40 mg Subcutaneous Q24H  . furosemide  40 mg Oral Daily  . losartan  25 mg Oral Daily  . sodium chloride flush  10-40 mL Intracatheter Q12H  . sodium chloride flush  3 mL Intravenous Q12H  . spironolactone  25 mg Oral QHS    Infusions: . sodium chloride    . sodium chloride    . sodium chloride 10 mL/hr at 03/10/20 0707  . amiodarone 30 mg/hr (03/10/20 4158)  . milrinone 0.25 mcg/kg/min (03/10/20 3094)    PRN Medications: sodium chloride, docusate sodium, polyethylene glycol, sodium chloride flush, sodium chloride flush     Assessment/Plan   1. A/C Systolic HF-->Cardiogenic Shock  - Has known ICM/NIMC 1V CAD with chronically occluded RCA/familial  brother/dad heart transplant.   -Presented in cardiogenic shock  --ECHO EF 20-25% severe MR.   - On 6/10 CO-OX remained low. Started on milrinone and increased to 0.375 mcg  + norepi was increased to 11 mcg.  - Now off NE. Titrating down milrinone, now 0.25 mcg.  - Co-ox 62%-> 55% -> 60%->72% - On dig/spiro/losartan. BP too low too titrate. Check dig level in am.  - No b-blocker yet with shock - Follow co-ox closely  - R/L cath today   2. Hyperkalemia/hypokalemia - K stable at 4.0 today   3. CAD  -1V chronically occluded RCA - denies CP  -Continue asa 81 mg daily.  -Continue atorvastatin 40  - Plan LHC today    4. Suspect AKI in the setting of shock  - Creatinine 1.7 on admit.  - resolved  7. Elevated LFTs -Suspect in the setting of shock.  - Follow LFTs. Check CMP in am.   8. MR, severe  - likely functional   9. PVCs - suppressed w/ amio. Continue gtt while on milrinone - keep K> 4.0 Mg > 2.0  Length of Stay: [address], PA-C  03/10/2020, 7:17 AM  Advanced Heart Failure Team Pager [phone] (M-F; 7a - 4p)  Please contact [facility] for night-coverage after hours (4p -7a ) and weekends on amion   Patient seen and examined with the above-signed Advanced Practice Provider and/or Housestaff. I personally reviewed laboratory data, imaging studies and relevant notes. I independently examined the patient and formulated the important aspects of the plan. I have edited the note to reflect any of my changes or salient points. I have personally discussed the plan with the patient and/or family.  Continues to improve. Remains on milrinone. Co-ox up to 72%. Volume status looks good CVP 2-3. Creatinine has normalized  General:  Well appearing. No resp difficulty HEENT: normal Neck: supple. no JVD. Carotids 2+ bilat; no bruits. No lymphadenopathy or thryomegaly appreciated. Cor: PMI nondisplaced. Regular rate & rhythm. No rubs, gallops or murmurs. Lungs:  clear Abdomen: soft, nontender, nondistended. No hepatosplenomegaly. No bruits or masses. Good bowel sounds. Extremities: no cyanosis, clubbing, rash, edema Neuro: alert & orientedx3, cranial nerves grossly intact. moves all 4 extremities w/o difficulty. Affect pleasant  Cardiogenic shock resolving. For R/L cath today. Procedure discussed. Turn milrinone down to 0.125 today.   Arvilla Meres, MD  8:33 AM

## 2020-03-10 NOTE — Interval H&P Note (Signed)
History and Physical Interval Note:  03/10/2020 8:30 AM  Zachary Arias  has presented today for surgery, with the diagnosis of cardiogenic shock.  The various methods of treatment have been discussed with the patient and family. After consideration of risks, benefits and other options for treatment, the patient has consented to  Procedure(s): RIGHT/LEFT HEART CATH AND CORONARY ANGIOGRAPHY (N/A) and possible coronary angioplasty as a surgical intervention.  The patient's history has been reviewed, patient examined, no change in status, stable for surgery.  I have reviewed the patient's chart and labs.  Questions were answered to the patient's satisfaction.     Brighten Orndoff

## 2020-03-10 NOTE — Plan of Care (Signed)
  Problem: Education: Goal: Knowledge of General Education information will improve Description: Including pain rating scale, medication(s)/side effects and non-pharmacologic comfort measures Outcome: Progressing   Problem: Health Behavior/Discharge Planning: Goal: Ability to manage health-related needs will improve Outcome: Progressing   Problem: Clinical Measurements: Goal: Ability to maintain clinical measurements within normal limits will improve Outcome: Progressing Goal: Will remain free from infection Outcome: Progressing Goal: Diagnostic test results will improve Outcome: Progressing Goal: Respiratory complications will improve Outcome: Progressing Goal: Cardiovascular complication will be avoided Outcome: Progressing   Problem: Activity: Goal: Risk for activity intolerance will decrease Outcome: Progressing   Problem: Nutrition: Goal: Adequate nutrition will be maintained Outcome: Progressing   Problem: Coping: Goal: Level of anxiety will decrease Outcome: Progressing   Problem: Elimination: Goal: Will not experience complications related to bowel motility Outcome: Progressing Goal: Will not experience complications related to urinary retention Outcome: Progressing   Problem: Pain Managment: Goal: General experience of comfort will improve Outcome: Progressing   Problem: Safety: Goal: Ability to remain free from injury will improve Outcome: Progressing   Problem: Skin Integrity: Goal: Risk for impaired skin integrity will decrease Outcome: Progressing   Problem: Education: Goal: Understanding of CV disease, CV risk reduction, and recovery process will improve Outcome: Progressing   Problem: Activity: Goal: Ability to return to baseline activity level will improve Outcome: Progressing   Problem: Cardiovascular: Goal: Ability to achieve and maintain adequate cardiovascular perfusion will improve Outcome: Progressing Goal: Vascular access site(s)  Level 0-1 will be maintained Outcome: Progressing   Problem: Health Behavior/Discharge Planning: Goal: Ability to safely manage health-related needs after discharge will improve Outcome: Progressing   

## 2020-03-11 LAB — COMPREHENSIVE METABOLIC PANEL
ALT: 99 U/L — ABNORMAL HIGH (ref 0–44)
AST: 29 U/L (ref 15–41)
Albumin: 2.6 g/dL — ABNORMAL LOW (ref 3.5–5.0)
Alkaline Phosphatase: 46 U/L (ref 38–126)
Anion gap: 11 (ref 5–15)
BUN: 15 mg/dL (ref 6–20)
CO2: 23 mmol/L (ref 22–32)
Calcium: 8.6 mg/dL — ABNORMAL LOW (ref 8.9–10.3)
Chloride: 104 mmol/L (ref 98–111)
Creatinine, Ser: 1.14 mg/dL (ref 0.61–1.24)
GFR calc Af Amer: >60 mL/min (ref >60)
GFR calc non Af Amer: >60 mL/min (ref >60)
Glucose, Bld: 175 mg/dL — ABNORMAL HIGH (ref 70–99)
Potassium: 4.3 mmol/L (ref 3.5–5.1)
Sodium: 138 mmol/L (ref 135–145)
Total Bilirubin: 0.3 mg/dL (ref 0.3–1.2)
Total Protein: 4.7 g/dL — ABNORMAL LOW (ref 6.5–8.1)

## 2020-03-11 LAB — COOXEMETRY PANEL
Carboxyhemoglobin: 1 % (ref 0.5–1.5)
Methemoglobin: 0.8 % (ref 0.0–1.5)
O2 Saturation: 63.3 %
Total hemoglobin: 13.8 g/dL (ref 12.0–16.0)

## 2020-03-11 LAB — DIGOXIN LEVEL: Digoxin Level: 0.4 ng/mL — ABNORMAL LOW (ref 0.8–2.0)

## 2020-03-11 MED ORDER — SODIUM CHLORIDE 0.9% FLUSH
3.0000 mL | Freq: Two times a day (BID) | INTRAVENOUS | Status: DC
  Administered 2020-03-12 (×2): 3 mL via INTRAVENOUS

## 2020-03-11 MED ORDER — ENOXAPARIN SODIUM 40 MG/0.4ML ~~LOC~~ SOLN: 40.0000 mg | SUBCUTANEOUS | Status: DC

## 2020-03-11 MED ORDER — SODIUM CHLORIDE 0.9 % IV SOLN: 250.0000 mL | INTRAVENOUS | Status: DC | PRN

## 2020-03-11 MED ORDER — ONDANSETRON HCL 4 MG/2ML IJ SOLN: 4.0000 mg | Freq: Four times a day (QID) | INTRAMUSCULAR | Status: DC | PRN

## 2020-03-11 MED ORDER — SPIRONOLACTONE 25 MG PO TABS
25.0000 mg | ORAL_TABLET | Freq: Every day | ORAL | Status: DC
  Administered 2020-03-12 – 2020-03-13 (×2): 25 mg via ORAL
  Filled 2020-03-11 (×2): qty 1

## 2020-03-11 MED ORDER — HYDRALAZINE HCL 20 MG/ML IJ SOLN: 10.0000 mg | INTRAMUSCULAR | Status: AC | PRN

## 2020-03-11 MED ORDER — LABETALOL HCL 5 MG/ML IV SOLN: 10.0000 mg | INTRAVENOUS | Status: AC | PRN

## 2020-03-11 MED ORDER — SACUBITRIL-VALSARTAN 24-26 MG PO TABS
1.0000 | ORAL_TABLET | Freq: Two times a day (BID) | ORAL | Status: DC
  Administered 2020-03-11 – 2020-03-13 (×5): 1 via ORAL
  Filled 2020-03-11 (×5): qty 1

## 2020-03-11 MED ORDER — AMIODARONE HCL 200 MG PO TABS
200.0000 mg | ORAL_TABLET | Freq: Two times a day (BID) | ORAL | Status: DC
  Administered 2020-03-11 – 2020-03-13 (×5): 200 mg via ORAL
  Filled 2020-03-11 (×5): qty 1

## 2020-03-11 MED ORDER — SODIUM CHLORIDE 0.9% FLUSH
3.0000 mL | INTRAVENOUS | Status: DC | PRN
  Administered 2020-03-13: 3 mL via INTRAVENOUS

## 2020-03-11 MED ORDER — ACETAMINOPHEN 325 MG PO TABS: 650.0000 mg | ORAL_TABLET | ORAL | Status: DC | PRN

## 2020-03-11 MED FILL — Verapamil HCl IV Soln 2.5 MG/ML: INTRAVENOUS | Qty: 2 | Status: AC

## 2020-03-11 NOTE — Care Management (Signed)
Case management placed a benefits check for Entresto (sacubitril-valsartan) 24-26 mg by mouth twice a day for 30 day supply.

## 2020-03-11 NOTE — Plan of Care (Signed)

## 2020-03-11 NOTE — TOC Benefit Eligibility Note (Signed)
Transition of Care [facility]) Benefit Eligibility Note    Patient Details  Name: Marco Raper MRN: [id number] Date of Birth: 10-09-1960   Medication/Dose: [name] 24-26 mg bid for 30 day supply  Covered?: No (P.A. Required PH# [phone] Fax# [phone])        Spoke with Person/Company/Phone Number:: [facility]  Hell[place] Desk [phone]     Prior Approval: Yes          Renie Ora Phone Number: 03/11/2020, 12:22 PM

## 2020-03-11 NOTE — Progress Notes (Addendum)
Advanced Heart Failure Rounding Note  PCP-Cardiologist: Dr. Gala Romney   Subjective:    R/LHC yesterday w/ stable CAD with chronically occluded distal RCA and moderate non-obstructive CAD elsewhere. Low filling pressures and normal cardiac output on milrinone 0.25. RAP 1 on cath. Diuretics held.   CVP 4-5 today.   Co-ox 64% on 0.125 of milrinone.   OOB, sitting in chair. No complaints. Denies dyspnea. Right radial cath site stable.      ECHO EF 20-25% RV normal. Severe MR   R/LHC 6/14/Conclusion    Prox RCA lesion is 50% stenosed.  Dist RCA lesion is 100% stenosed.  Mid Cx lesion is 30% stenosed.  Mid LAD lesion is 40% stenosed.  1st Mrg lesion is 30% stenosed.   Findings:  On milrinone 0.25 mcg/kg/min  Ao = 86/61 (72) LV = 87/7 RA = 1 RV = 28/1 PA = 27/10 (18) PCW = 10 Fick cardiac output/index = 7.4/4.0 PVR = 1.0 WU SVR = 767 Ao sat = 95% PA sat = 75%, 77% SVC sat = 81%  Assessment: 1. CAD with chronically occluded distal RCA and moderate non-obstructive CAD elsewhere 2. Severe primarily non-ischemic CM EF 20% 3. Low filling pressures and normal cardiac output on milrinone 0.25    Objective:   Weight Range: 73.3 kg Body mass index is 25.31 kg/m.   Vital Signs:   Temp:  [97.7 F (36.5 C)-98.6 F (37 C)] 98.6 F (37 C) (06/15 0400) Pulse Rate:  [0-104] 76 (06/15 0600) Resp:  [0-22] 17 (06/15 0600) BP: (95-148)/(54-88) 107/62 (06/15 0600) SpO2:  [0 %-100 %] 97 % (06/15 0600) Last BM Date: 03/09/20  Weight change: Filed Weights   03/08/20 0400 03/09/20 0500 03/10/20 0600  Weight: 75.3 kg 73.9 kg 73.3 kg    Intake/Output:   Intake/Output Summary (Last 24 hours) at 03/11/2020 0714 Last data filed at 03/11/2020 0600 Gross per 24 hour  Intake 695.58 ml  Output 2700 ml  Net -2004.42 ml      Physical Exam   CVP 4-5 General:  Well appearing. No respiratory difficulty HEENT: normal Neck: supple. no JVD. Carotids 2+ bilat;  no bruits. No lymphadenopathy or thyromegaly appreciated. Cor: PMI nondisplaced. Regular rate & rhythm. No rubs, gallops or murmurs. Lungs: clear Abdomen: soft, nontender, nondistended. No hepatosplenomegaly. No bruits or masses. Good bowel sounds. Extremities: no cyanosis, clubbing, rash, edema, + RUE PICC, Rt radial cath site w/ 2+ radial pulse  Neuro: alert & oriented x 3, cranial nerves grossly intact. moves all 4 extremities w/o difficulty. Affect pleasant.    Telemetry   NSR 80s, rare PVCs personally reviewed   Labs    CBC Recent Labs    03/10/20 0944 03/10/20 0955  HGB 14.6 11.9*  HCT 43.0 35.0*   Basic Metabolic Panel Recent Labs    12/45/80 0402 03/10/20 0938 03/10/20 0955 03/11/20 0400  NA 139   < > 145 138  K 4.0   < > 4.7 4.3  CL 102  --   --  104  CO2 27  --   --  23  GLUCOSE 112*  --   --  175*  BUN 14  --   --  15  CREATININE 1.12  --   --  1.14  CALCIUM 9.2  --   --  8.6*   < > = values in this interval not displayed.   Liver Function Tests Recent Labs    03/11/20 0400  AST 29  ALT 99*  ALKPHOS 46  BILITOT 0.3  PROT 4.7*  ALBUMIN 2.6*   No results for input(s): LIPASE, AMYLASE in the last 72 hours. Cardiac Enzymes No results for input(s): CKTOTAL, CKMB, CKMBINDEX, TROPONINI in the last 72 hours.  BNP: BNP (last 3 results) Recent Labs    03/04/20 2224  BNP 1,300.0*    ProBNP (last 3 results) No results for input(s): PROBNP in the last 8760 hours.   D-Dimer No results for input(s): DDIMER in the last 72 hours. Hemoglobin A1C No results for input(s): HGBA1C in the last 72 hours. Fasting Lipid Panel No results for input(s): CHOL, HDL, LDLCALC, TRIG, CHOLHDL, LDLDIRECT in the last 72 hours. Thyroid Function Tests No results for input(s): TSH, T4TOTAL, T3FREE, THYROIDAB in the last 72 hours.  Invalid input(s): FREET3  Other results:   Imaging    CARDIAC CATHETERIZATION  Result Date: 03/10/2020  Prox RCA lesion is 50%  stenosed.  Dist RCA lesion is 100% stenosed.  Mid Cx lesion is 30% stenosed.  Mid LAD lesion is 40% stenosed.  1st Mrg lesion is 30% stenosed.  Findings: On milrinone 0.25 mcg/kg/min Ao = 86/61 (72) LV = 87/7 RA = 1 RV = 28/1 PA = 27/10 (18) PCW = 10 Fick cardiac output/index = 7.4/4.0 PVR = 1.0 WU SVR = 767 Ao sat = 95% PA sat = 75%, 77% SVC sat = 81% Assessment: 1. CAD with chronically occluded distal RCA and moderate non-obstructive CAD elsewhere 2. Severe primarily non-ischemic CM EF 20% 3. Low filling pressures and normal cardiac output on milrinone 0.25 Plan/Discussion: Wean milrinone. Hold diuretics for now. Glori Bickers, MD 10:08 AM     Medications:     Scheduled Medications: . aspirin EC  81 mg Oral Daily  . atorvastatin  40 mg Oral Daily  . Chlorhexidine Gluconate Cloth  6 each Topical Daily  . digoxin  0.125 mg Oral Daily  . enoxaparin (LOVENOX) injection  40 mg Subcutaneous Q24H  . losartan  25 mg Oral Daily  . sodium chloride flush  10-40 mL Intracatheter Q12H  . spironolactone  25 mg Oral QHS    Infusions: . sodium chloride    . amiodarone 30 mg/hr (03/11/20 0653)  . milrinone 0.125 mcg/kg/min (03/11/20 0600)    PRN Medications: docusate sodium, polyethylene glycol, sodium chloride flush     Assessment/Plan   1. A/C Systolic HF-->Cardiogenic Shock  - Has known ICM/NIMC 1V CAD with chronically occluded RCA/familial brother/dad heart transplant.   -Presented in cardiogenic shock  --ECHO EF 20-25% severe MR.   - On 6/10 CO-OX remained low. Started on milrinone and increased to 0.375 mcg  + norepi was increased to 11 mcg.  - Now off NE. Titrating down milrinone, now 0.125 mcg.  - Co-ox 62%-> 55% -> 60%->72%->64% - R/LHC showed stable CAD (chronically occluded distal RCA and moderate non-obstructive CAD elsewhere), low filling pressures and normal cardiac output on milrinone 0.25. Findings c/w NICM.  - Diuretics on hold for low CVP, 4-5 today  - Stop  milrinone today.  Follow co-ox - Stop losartan. Switch to low dose Entresto 24-26 bid. Monitor BP - Continue Spiro 25 mg daily  - Continue digoxin 0.125 mg daily. Dig level pending.   - No b-blocker yet with recent shock - ambulate w/ cardiac rehab   2. Hyperkalemia/hypokalemia - K stable at 4.3  today   3. CAD  -1V chronically occluded RCA - Repeat LHC 6/14 w/ stable disease. Chronically occluded distal RCA and moderate non-obstructive CAD elsewhere -  Continue asa 81 mg daily.  -Continue atorvastatin 40    4. Suspect AKI in the setting of shock  - Creatinine 1.7 on admit.  - resolved  7. Elevated LFTs -Suspect in the setting of shock.  - LFTs are improving   8. MR, severe  - likely functional   9. PVCs - suspect increased ectopy from milrinone - stop milrinone and amio gtt today  - follow PVC burden on tele - keep K> 4.0 Mg > 2.0  Transfer out of ICU today. Ambulate w/ CR.   Length of Stay: [address], [place]  03/11/2020, 7:14 AM  Advanced Heart Failure Team Pager [phone] (M-F; 7a - 4p)  Please contact [facility] for night-coverage after hours (4p -7a ) and weekends on amion  Patient seen and examined with the above-signed Advanced Practice Provider and/or Housestaff. I personally reviewed laboratory data, imaging studies and relevant notes. I independently examined the patient and formulated the important aspects of the plan. I have edited the note to reflect any of my changes or salient points. I have personally discussed the plan with the patient and/or family.  Cath results reviewed with him. Milrinone down to 0.125. Co-ox and CVP stable. Feels ok. Off lasix.  General:  Well appearing. No resp difficulty HEENT: normal Neck: supple. no JVD. Carotids 2+ bilat; no bruits. No lymphadenopathy or thryomegaly appreciated. Cor: PMI nondisplaced. Regular rate & rhythm. No rubs, gallops or murmurs. Lungs: clear Abdomen: soft, nontender, nondistended. No  hepatosplenomegaly. No bruits or masses. Good bowel sounds. Extremities: no cyanosis, clubbing, rash, edema Neuro: alert & orientedx3, cranial nerves grossly intact. moves all 4 extremities w/o difficulty. Affect pleasant  Stop milrinone. Switch losartan to Entresto. Watch BP. Can switch amio to 200 bid and stop after a month or two. Move to SDU. Follow co-ox.   Arvilla Meres, MD  7:46 AM

## 2020-03-11 NOTE — Progress Notes (Addendum)
Pharmacy Consult to Evaluate Supplements  The following medication list was obtained from the patient (recommendations and comments listed in bold):  D3 10,000 U/K2 45 mcg - taking doses of vitamin D higher than 4000 units for extended periods of time can be potential unsafe (can lead to elevated calcium levels), unless known vitamin D deficiency - would avoid while on digoxin therapy. Vitamin K2 can impact clotting risk.   Multivitamin - likely safe   N-Acetyl Cysteine (1.2 g) - can potentially lead to a decrease in blood pressure - would avoid with current HF medications  Zinc Glycinate (20 mg) - avoiding taking over 40 mg total daily, unless known zinc deficiency   Collagen (10.9 g) - possibly safe, limited evidence of interactions  Cleansing Tea - works as a laxative, would avoid in close timing with other medications   Potassium Iodide (12,500 mg)- would avoid OTC supplement products and use recommend potassium prescription formulations prescribed by MD   Allicin Advanced (425 mg)- contains garlic in addition to other listed vitamins; high dose supplementation of garlic can increase risk of bleeding  Astaxanthin Max (534 mg) - is likely safe at doses ranging from 4-40 mg for up to 12 weeks; given higher dose would avoid since efficacy and safety after that time frame is limited  Super Potent E (33.5 mg)- possibly safe; however, ensure total vitamin E intake is less than 1000 mg daily   B Complex - possibly safe   CoQ10 Ubiquinol (100 mg)- possibly safe  [place] Omega 3 (Fish Oil)- could consider using alternative FDA approved Fish Oil options  Selenium (200 mcg) - likely safe for total dosing under 400 mg for short time since can be included in other listed products on this list (otherwise can cause selenium toxicity- uscle tenderness, tremor, lightheadedness, facial flushing, blood clotting problems, liver and kidney problems)  Red Beet (2,500 mg)- due to fact that beets can  increase nitric oxide thus potentially leading to lower blood pressure - would advise avoiding (safe to consume in qualities found in food)  Magnesium Drink (300 mg) - possibly safe, would recommend monitoring Magnesium levels   HGH Stimulate - contains iodine which is a concern with current amiodarone order; would recommend avoiding.  Turmeric Force (320 mg) - undergoes metabolism in the liver so potential for drug-drug interactions with multiple CYP enzymes; also can increase the risk of bleeding at higher doses   Vitamin C (750 mg) - likely safe; however, ensure total vitamin C intake is less than 2000 mg total daily  Melatonin (10 mg)- possibly safe   Cholesterol X - would avoid given contains Red Yeast Rice and starting on atorvastatin  Thank you for allowing to participate in patient care,  Sherron Monday, PharmD, BCCCP Clinical Pharmacist  Phone: [phone] 03/11/2020 4:27 PM  Please check AMION for all [facility] phone numbers After 10:00 PM, call Main Pharmacy [phone]   Reference used: Natural Medicine Comprehensive Database

## 2020-03-12 LAB — CBC
HCT: 46.2 % (ref 39.0–52.0)
Hemoglobin: 15 g/dL (ref 13.0–17.0)
MCH: 27.7 pg (ref 26.0–34.0)
MCHC: 32.5 g/dL (ref 30.0–36.0)
MCV: 85.4 fL (ref 80.0–100.0)
Platelets: 243 10*3/uL (ref 150–400)
RBC: 5.41 MIL/uL (ref 4.22–5.81)
RDW: 14.6 % (ref 11.5–15.5)
WBC: 9 10*3/uL (ref 4.0–10.5)
nRBC: 0 % (ref 0.0–0.2)

## 2020-03-12 LAB — BASIC METABOLIC PANEL
Anion gap: 10 (ref 5–15)
BUN: 12 mg/dL (ref 6–20)
CO2: 26 mmol/L (ref 22–32)
Calcium: 9 mg/dL (ref 8.9–10.3)
Chloride: 105 mmol/L (ref 98–111)
Creatinine, Ser: 1.1 mg/dL (ref 0.61–1.24)
GFR calc Af Amer: >60 mL/min (ref >60)
GFR calc non Af Amer: >60 mL/min (ref >60)
Glucose, Bld: 91 mg/dL (ref 70–99)
Potassium: 4.3 mmol/L (ref 3.5–5.1)
Sodium: 141 mmol/L (ref 135–145)

## 2020-03-12 LAB — COOXEMETRY PANEL
Carboxyhemoglobin: 1.3 % (ref 0.5–1.5)
Methemoglobin: 1 % (ref 0.0–1.5)
O2 Saturation: 63.4 %
Total hemoglobin: 15.4 g/dL (ref 12.0–16.0)

## 2020-03-12 NOTE — Progress Notes (Addendum)
Advanced Heart Failure Rounding Note  PCP-Cardiologist: Dr. Gala Romney   Subjective:    [facility] 6/14 w/ stable CAD with chronically occluded distal RCA and moderate non-obstructive CAD elsewhere. Low filling pressures and normal cardiac output on milrinone 0.25. RAP 1 on cath. Diuretics held.   Milrinone stopped yesterday. Co-ox stable at 63%.   CVP 6.   BP soft w/ Entresto, upper 90s systolic. Denies dizziness/ lightheadedness.   No dyspnea.     ECHO EF 20-25% RV normal. Severe MR   R/LHC 6/14/Conclusion    Prox RCA lesion is 50% stenosed.  Dist RCA lesion is 100% stenosed.  Mid Cx lesion is 30% stenosed.  Mid LAD lesion is 40% stenosed.  1st Mrg lesion is 30% stenosed.   Findings:  On milrinone 0.25 mcg/kg/min  Ao = 86/61 (72) LV = 87/7 RA = 1 RV = 28/1 PA = 27/10 (18) PCW = 10 Fick cardiac output/index = 7.4/4.0 PVR = 1.0 WU SVR = 767 Ao sat = 95% PA sat = 75%, 77% SVC sat = 81%  Assessment: 1. CAD with chronically occluded distal RCA and moderate non-obstructive CAD elsewhere 2. Severe primarily non-ischemic CM EF 20% 3. Low filling pressures and normal cardiac output on milrinone 0.25    Objective:   Weight Range: 71.3 kg Body mass index is 24.26 kg/m.   Vital Signs:   Temp:  [97.6 F (36.4 C)-98.2 F (36.8 C)] 98.2 F (36.8 C) (06/16 0723) Pulse Rate:  [72-98] 79 (06/16 0723) Resp:  [14-20] 17 (06/16 0723) BP: (98-151)/(58-94) 98/66 (06/16 0723) SpO2:  [94 %-100 %] 98 % (06/16 0723) Weight:  [71.3 kg-72.6 kg] 71.3 kg (06/16 0410) Last BM Date: 03/11/20  Weight change: Filed Weights   03/11/20 0700 03/11/20 2127 03/12/20 0410  Weight: 72.2 kg 72.6 kg 71.3 kg    Intake/Output:   Intake/Output Summary (Last 24 hours) at 03/12/2020 0759 Last data filed at 03/12/2020 0412 Gross per 24 hour  Intake 1473.56 ml  Output 2800 ml  Net -1326.44 ml      Physical Exam   CVP 6 General:  Well appearing. No respiratory  difficulty HEENT: normal Neck: supple. no JVD. Carotids 2+ bilat; no bruits. No lymphadenopathy or thyromegaly appreciated. Cor: PMI nondisplaced. Regular rate & rhythm. No rubs, gallops or murmurs. Lungs: clear Abdomen: soft, nontender, nondistended. No hepatosplenomegaly. No bruits or masses. Good bowel sounds. Extremities: no cyanosis, clubbing, rash, edema + RUE PICC  Neuro: alert & oriented x 3, cranial nerves grossly intact. moves all 4 extremities w/o difficulty. Affect pleasant.     Telemetry   NSR 80s, no PVCs personally reviewed   Labs    CBC Recent Labs    03/10/20 0955 03/12/20 0441  WBC  --  9.0  HGB 11.9* 15.0  HCT 35.0* 46.2  MCV  --  85.4  PLT  --  243   Basic Metabolic Panel Recent Labs    21/22/48 0400 03/12/20 0441  NA 138 141  K 4.3 4.3  CL 104 105  CO2 23 26  GLUCOSE 175* 91  BUN 15 12  CREATININE 1.14 1.10  CALCIUM 8.6* 9.0   Liver Function Tests Recent Labs    03/11/20 0400  AST 29  ALT 99*  ALKPHOS 46  BILITOT 0.3  PROT 4.7*  ALBUMIN 2.6*   No results for input(s): LIPASE, AMYLASE in the last 72 hours. Cardiac Enzymes No results for input(s): CKTOTAL, CKMB, CKMBINDEX, TROPONINI in the last 72 hours.  BNP:  BNP (last 3 results) Recent Labs    03/04/20 2224  BNP 1,300.0*    ProBNP (last 3 results) No results for input(s): PROBNP in the last 8760 hours.   D-Dimer No results for input(s): DDIMER in the last 72 hours. Hemoglobin A1C No results for input(s): HGBA1C in the last 72 hours. Fasting Lipid Panel No results for input(s): CHOL, HDL, LDLCALC, TRIG, CHOLHDL, LDLDIRECT in the last 72 hours. Thyroid Function Tests No results for input(s): TSH, T4TOTAL, T3FREE, THYROIDAB in the last 72 hours.  Invalid input(s): FREET3  Other results:   Imaging    No results found.   Medications:     Scheduled Medications: . amiodarone  200 mg Oral BID  . aspirin EC  81 mg Oral Daily  . atorvastatin  40 mg Oral Daily   . Chlorhexidine Gluconate Cloth  6 each Topical Daily  . digoxin  0.125 mg Oral Daily  . enoxaparin (LOVENOX) injection  40 mg Subcutaneous Q24H  . enoxaparin (LOVENOX) injection  40 mg Subcutaneous Q24H  . sacubitril-valsartan  1 tablet Oral BID  . sodium chloride flush  10-40 mL Intracatheter Q12H  . sodium chloride flush  3 mL Intravenous Q12H  . spironolactone  25 mg Oral Daily    Infusions: . sodium chloride    . sodium chloride      PRN Medications: sodium chloride, acetaminophen, docusate sodium, ondansetron (ZOFRAN) IV, polyethylene glycol, sodium chloride flush, sodium chloride flush     Assessment/Plan   1. A/C Systolic HF-->Cardiogenic Shock  - Has known ICM/NIMC 1V CAD with chronically occluded RCA/familial brother/dad heart transplant.   -Presented in cardiogenic shock  --ECHO EF 20-25% severe MR.   - On 6/10 CO-OX remained low. Started on milrinone and increased to 0.375 mcg  + norepi was increased to 11 mcg.  - now off pressors/ inotropes  -Co-ox stable off milrinone at 63% [facility] showed stable CAD (chronically occluded distal RCA and moderate non-obstructive CAD elsewhere), low filling pressures and normal cardiac output on milrinone 0.25. Findings c/w NICM.  - no loop diuretic requirements currently. CVP 6  - on Entresto 24-26 bid. W/ soft BP may need switch back to losartan. Monitor BP  - Continue Spiro 25 mg daily  - Continue digoxin 0.125 mg daily. Dig level ok (0.4)  - No b-blocker yet with recent shock - ambulate w/ cardiac rehab   2. Hyperkalemia/hypokalemia - K stable at 4.3  today   3. CAD  -1V chronically occluded RCA - Repeat LHC 6/14 w/ stable disease. Chronically occluded distal RCA and moderate non-obstructive CAD elsewhere -Continue asa 81 mg daily.  -Continue atorvastatin 40    4. Suspect AKI in the setting of shock  - Creatinine 1.7 on admit.  - resolved  7. Elevated LFTs -Suspect in the setting of shock.  - LFTs are  improving   8. MR, severe  - likely functional   9. PVCs - suspect increased ectopy from milrinone - suppressed w/ amiodarone  - continue PO amio 200 mg bid for 4-8 weeks, then discontinue  - keep K> 4.0 Mg > 2.0  Possible d/c home tomorrow if co-ox remains stable.   Length of Stay: [address], PA-C  03/12/2020, 7:59 AM  Advanced Heart Failure Team Pager [phone] (M-F; [place])  Please contact [facility] for night-coverage after hours (4p -7a ) and weekends on amion  Patient seen and examined with the above-signed Advanced Practice Provider and/or Housestaff. I personally reviewed  laboratory data, imaging studies and relevant notes. I independently examined the patient and formulated the important aspects of the plan. I have edited the note to reflect any of my changes or salient points. I have personally discussed the plan with the patient and/or family.  Continues to improve. Co-ox stable off milrinone. Volume status ok. BP soft on Entresto. Denies SOB.  General:  Well appearing. No resp difficulty HEENT: normal Neck: supple. no JVD. Carotids 2+ bilat; no bruits. No lymphadenopathy or thryomegaly appreciated. Cor: PMI nondisplaced. Regular rate & rhythm. No rubs, gallops or murmurs. Lungs: clear Abdomen: soft, nontender, nondistended. No hepatosplenomegaly. No bruits or masses. Good bowel sounds. Extremities: no cyanosis, clubbing, rash, edema Neuro: alert & orientedx3, cranial nerves grossly intact. moves all 4 extremities w/o difficulty. Affect pleasant  Cardiogenic shock resolved. Stabilizing off milrinone. Will watch one more day. To ensure stability of co-ox, BP and volume status on oral regimen. Ambulate. Continue amio for PVC suppression.   Arvilla Meres, MD  8:23 AM

## 2020-03-12 NOTE — Progress Notes (Signed)
CARDIAC REHAB PHASE I   PRE:  Rate/Rhythm: 80  BP:  Sitting: 99/61     SaO2: 95 RA  MODE:  Ambulation: 400 ft   POST:  Rate/Rhythm: 100 ST  BP:  Sitting: 105/72    SaO2: 94 RA  Pt ambulated 442ft in hallway with ease. Pt denies CP, SOB, or dizziness. Pt given HF booklet, low sodium diets, and exercise guidelines. Encouraged ambulation and daily weights. Pt with concerns about if/when he can return to work.    [id number] Reynold Bowen, RN BSN 03/12/2020 3:01 PM

## 2020-03-12 NOTE — Plan of Care (Signed)

## 2020-03-12 NOTE — Progress Notes (Signed)
Patient advised that dressing change is due today for PICC line on right upper arm, patient will not be going home with this IV, and we are no longer using this for access to give abx/IV fluids---dressing is clean,dry,intact but is still due dressing change today to enhance infection prevention---patient anticipates going home tomorrow 03/13/20 per doctor discussion today in AM and prefers to wait until tomorrow to have PICC removed, with no dressing change today

## 2020-03-13 ENCOUNTER — Telehealth (HOSPITAL_COMMUNITY): Payer: Self-pay | Admitting: Pharmacy Technician

## 2020-03-13 LAB — COOXEMETRY PANEL
Carboxyhemoglobin: 1.3 % (ref 0.5–1.5)
Methemoglobin: 1 % (ref 0.0–1.5)
O2 Saturation: 64.4 %
Total hemoglobin: 15.7 g/dL (ref 12.0–16.0)

## 2020-03-13 LAB — CBC
HCT: 47.3 % (ref 39.0–52.0)
Hemoglobin: 15.3 g/dL (ref 13.0–17.0)
MCH: 27.5 pg (ref 26.0–34.0)
MCHC: 32.3 g/dL (ref 30.0–36.0)
MCV: 84.9 fL (ref 80.0–100.0)
Platelets: 254 10*3/uL (ref 150–400)
RBC: 5.57 MIL/uL (ref 4.22–5.81)
RDW: 14.5 % (ref 11.5–15.5)
WBC: 9.6 10*3/uL (ref 4.0–10.5)
nRBC: 0 % (ref 0.0–0.2)

## 2020-03-13 LAB — BASIC METABOLIC PANEL
Anion gap: 9 (ref 5–15)
BUN: 15 mg/dL (ref 6–20)
CO2: 26 mmol/L (ref 22–32)
Calcium: 9 mg/dL (ref 8.9–10.3)
Chloride: 105 mmol/L (ref 98–111)
Creatinine, Ser: 1.15 mg/dL (ref 0.61–1.24)
GFR calc Af Amer: >60 mL/min (ref >60)
GFR calc non Af Amer: >60 mL/min (ref >60)
Glucose, Bld: 90 mg/dL (ref 70–99)
Potassium: 4.5 mmol/L (ref 3.5–5.1)
Sodium: 140 mmol/L (ref 135–145)

## 2020-03-13 MED ORDER — SPIRONOLACTONE 25 MG PO TABS: 25.0000 mg | ORAL_TABLET | Freq: Every day | ORAL | 3 refills | Status: DC

## 2020-03-13 MED ORDER — AMIODARONE HCL 200 MG PO TABS: 200.0000 mg | ORAL_TABLET | Freq: Every day | ORAL | 1 refills | Status: DC

## 2020-03-13 MED ORDER — SACUBITRIL-VALSARTAN 24-26 MG PO TABS: 1.0000 | ORAL_TABLET | Freq: Two times a day (BID) | ORAL | 6 refills | Status: DC

## 2020-03-13 MED ORDER — DIGOXIN 125 MCG PO TABS: 0.1250 mg | ORAL_TABLET | Freq: Every day | ORAL | 6 refills | Status: DC

## 2020-03-13 MED FILL — AMIODARONE HCL 200 MG TAB: 200 | 30 days supply | Qty: 30 | Fill #0

## 2020-03-13 MED FILL — DIGOXIN 0.125 MG TABLET: 125 | 30 days supply | Qty: 30 | Fill #0

## 2020-03-13 MED FILL — SPIRONOLACTONE 25 MG TABLET: 25 | 30 days supply | Qty: 30 | Fill #0

## 2020-03-13 MED FILL — ENTRESTO 24 MG-26 MG TABLET: 24-26 | 30 days supply | Qty: 60 | Fill #0

## 2020-03-13 NOTE — Progress Notes (Signed)
CSW met with patient at bedside to introduce patient to the [employer].  CSW discussed program and services and explained patient should receive call next week from CP as well as [name], LCSW. Patient verbalizes understanding of program and follow up. CSW available as needed. Raquel Sarna, [place], [place]

## 2020-03-13 NOTE — Progress Notes (Addendum)
Advanced Heart Failure Rounding Note  PCP-Cardiologist: Dr. Haroldine Laws   Subjective:    Feeling ok. Wants to go home.   [facility] 6/14 w/ stable CAD with chronically occluded distal RCA and moderate non-obstructive CAD elsewhere. Low filling pressures and normal cardiac output on milrinone 0.25. RAP 1 on cath.   CO-OX stable off milrinone. CO-OX 64%.   ECHO EF 20-25% RV normal. Severe MR   R/LHC 6/14/Conclusion    Prox RCA lesion is 50% stenosed.  Dist RCA lesion is 100% stenosed.  Mid Cx lesion is 30% stenosed.  Mid LAD lesion is 40% stenosed.  1st Mrg lesion is 30% stenosed.   Findings:  On milrinone 0.25 mcg/kg/min  Ao = 86/61 (72) LV = 87/7 RA = 1 RV = 28/1 PA = 27/10 (18) PCW = 10 Fick cardiac output/index = 7.4/4.0 PVR = 1.0 WU SVR = 767 Ao sat = 95% PA sat = 75%, 77% SVC sat = 81%  Assessment: 1. CAD with chronically occluded distal RCA and moderate non-obstructive CAD elsewhere 2. Severe primarily non-ischemic CM EF 20% 3. Low filling pressures and normal cardiac output on milrinone 0.25    Objective:   Weight Range: 71.4 kg Body mass index is 24.29 kg/m.   Vital Signs:   Temp:  [97.7 F (36.5 C)-98.7 F (37.1 C)] 97.9 F (36.6 C) (06/17 0713) Pulse Rate:  [76-80] 76 (06/17 0713) Resp:  [11-20] 19 (06/17 0501) BP: (90-103)/(57-83) 94/61 (06/17 0713) SpO2:  [97 %-100 %] 98 % (06/17 0713) Weight:  [71.4 kg] 71.4 kg (06/17 0500) Last BM Date: 03/12/20  Weight change: Filed Weights   03/11/20 2127 03/12/20 0410 03/13/20 0500  Weight: 72.6 kg 71.3 kg 71.4 kg    Intake/Output:   Intake/Output Summary (Last 24 hours) at 03/13/2020 0820 Last data filed at 03/13/2020 0416 Gross per 24 hour  Intake 716 ml  Output 600 ml  Net 116 ml      Physical Exam  CVP 3  General:  Well appearing. No resp difficulty. Sitting in the chair.  HEENT: normal Neck: supple. no JVD. Carotids 2+ bilat; no bruits. No lymphadenopathy or thryomegaly  appreciated. Cor: PMI nondisplaced. Regular rate & rhythm. No rubs, gallops or murmurs. Lungs: clear Abdomen: soft, nontender, nondistended. No hepatosplenomegaly. No bruits or masses. Good bowel sounds. Extremities: no cyanosis, clubbing, rash, edema. RUE PICC  Neuro: alert & orientedx3, cranial nerves grossly intact. moves all 4 extremities w/o difficulty. Affect pleasant    Telemetry   SR  Occasional PVCs.  Labs    CBC Recent Labs    03/12/20 0441 03/13/20 0416  WBC 9.0 9.6  HGB 15.0 15.3  HCT 46.2 47.3  MCV 85.4 84.9  PLT 243 676   Basic Metabolic Panel Recent Labs    03/12/20 0441 03/13/20 0416  NA 141 140  K 4.3 4.5  CL 105 105  CO2 26 26  GLUCOSE 91 90  BUN 12 15  CREATININE 1.10 1.15  CALCIUM 9.0 9.0   Liver Function Tests Recent Labs    03/11/20 0400  AST 29  ALT 99*  ALKPHOS 46  BILITOT 0.3  PROT 4.7*  ALBUMIN 2.6*   No results for input(s): LIPASE, AMYLASE in the last 72 hours. Cardiac Enzymes No results for input(s): CKTOTAL, CKMB, CKMBINDEX, TROPONINI in the last 72 hours.  BNP: BNP (last 3 results) Recent Labs    03/04/20 2224  BNP 1,300.0*    ProBNP (last 3 results) No results for input(s): PROBNP  in the last 8760 hours.   D-Dimer No results for input(s): DDIMER in the last 72 hours. Hemoglobin A1C No results for input(s): HGBA1C in the last 72 hours. Fasting Lipid Panel No results for input(s): CHOL, HDL, LDLCALC, TRIG, CHOLHDL, LDLDIRECT in the last 72 hours. Thyroid Function Tests No results for input(s): TSH, T4TOTAL, T3FREE, THYROIDAB in the last 72 hours.  Invalid input(s): FREET3  Other results:   Imaging    No results found.   Medications:     Scheduled Medications: . amiodarone  200 mg Oral BID  . aspirin EC  81 mg Oral Daily  . atorvastatin  40 mg Oral Daily  . Chlorhexidine Gluconate Cloth  6 each Topical Daily  . digoxin  0.125 mg Oral Daily  . enoxaparin (LOVENOX) injection  40 mg Subcutaneous  Q24H  . sacubitril-valsartan  1 tablet Oral BID  . sodium chloride flush  10-40 mL Intracatheter Q12H  . sodium chloride flush  3 mL Intravenous Q12H  . spironolactone  25 mg Oral Daily    Infusions: . sodium chloride    . sodium chloride      PRN Medications: sodium chloride, acetaminophen, docusate sodium, ondansetron (ZOFRAN) IV, polyethylene glycol, sodium chloride flush, sodium chloride flush     Assessment/Plan   1. A/C Systolic HF-->Cardiogenic Shock  - Has known ICM/NIMC 1V CAD with chronically occluded RCA/familial brother/dad heart transplant.   -Presented in cardiogenic shock  --ECHO EF 20-25% severe MR.   - On 6/10 CO-OX remained low. Started on milrinone and increased to 0.375 mcg  + norepi was increased to 11 mcg.  - now off pressors/ inotropes  -Co-ox stable off milrinone at 63% [facility] showed stable CAD (chronically occluded distal RCA and moderate non-obstructive CAD elsewhere), low filling pressures and normal cardiac output on milrinone 0.25. Findings c/w NICM.  -CVP 3. Volume status stable.  - Continue Entresto 24-26 bid.  - Continue Spiro 25 mg daily  - Continue digoxin 0.125 mg daily. Dig level ok (0.4)  - No b-blocker yet with recent shock  2. Hyperkalemia/hypokalemia - K stable today.   3. CAD  -1V chronically occluded RCA - Repeat LHC 6/14 w/ stable disease. Chronically occluded distal RCA and moderate non-obstructive CAD elsewhere -Continue asa 81 mg daily.  -Continue atorvastatin 40   4. Suspect AKI in the setting of shock  - Creatinine 1.7 on admit.  - resolved  7. Elevated LFTs -Suspect in the setting of shock.  - LFTs are improving   8. MR, severe  - likely functional   9. PVCs - suspect increased ectopy from milrinone - suppressed w/ amiodarone  - continue PO amio 200 mg bid for 4-8 weeks, then discontinue  - keep K> 4.0 Mg > 2.0  Refer to HF Paramedicine. Will follow closely in the HF clinic.    Length of Stay: 8  Amy  Clegg, NP  03/13/2020, 8:20 AM  Advanced Heart Failure Team Pager [phone] (M-F; 7a - 4p)  Please contact [facility] for night-coverage after hours (4p -7a ) and weekends on amion  Patient seen and examined with the above-signed Advanced Practice Provider and/or Housestaff. I personally reviewed laboratory data, imaging studies and relevant notes. I independently examined the patient and formulated the important aspects of the plan. I have edited the note to reflect any of my changes or salient points. I have personally discussed the plan with the patient and/or family.  Much improved. Denies CP or SOB. Volume status looks good.  General:  Well appearing. No resp difficulty HEENT: normal Neck: supple. no JVD. Carotids 2+ bilat; no bruits. No lymphadenopathy or thryomegaly appreciated. Cor: PMI nondisplaced. Regular rate & rhythm. No rubs, gallops or murmurs. Lungs: clear Abdomen: soft, nontender, nondistended. No hepatosplenomegaly. No bruits or masses. Good bowel sounds. Extremities: no cyanosis, clubbing, rash, edema Neuro: alert & orientedx3, cranial nerves grossly intact. moves all 4 extremities w/o difficulty. Affect pleasant  Plan d/c today on above meds. Will follow closely in HF Clinic. No return to work until further notice.   Arvilla Meres, MD  10:07 AM

## 2020-03-13 NOTE — Telephone Encounter (Signed)
Advanced Heart Failure Patient Advocate Encounter  Prior Authorization for [name] has been approved by [employer] [employer].    Effective dates: 03/13/20 through 03/13/23  Patients co-pay is $267.62 (patient still has deductible to meet.)  Was able to obtain $10 co-pay card for patient.  Pharmacy Billing Information  BIN: [id number]  PCN: [id number]  Group: [id number]  ID: [id number]  Will confirm which pharmacy to send co-pay card information to.

## 2020-03-13 NOTE — Progress Notes (Signed)
Patient provided with verbal discharge instructions. Copy of discharge summary provide to patient. RN answered all questions.  VSS at discharge. PICC removed per orders. TOC pharmacy brought medication to pt room. Patient belongings sent with patient. Pt d/c via wheelchair through [employer] entrance to [place].

## 2020-03-13 NOTE — Discharge Summary (Signed)
Advanced Heart Failure Team  Discharge Summary   Patient ID: Zachary Arias MRN: [id number], DOB/AGE: 1961-04-20 59 y.o. Admit date: 03/04/2020 D/C date:     03/13/2020   Primary Discharge Diagnoses:  1. A/C Systolic Heart Failure-->Cardiogenic Shock  2. Hyperkalemia/Hypokalemia  3. CAD  4.  Suspect AKI in the setting of shock  5. Elevated LFTs 6. MR, severe 7. PVCs   Hospital Course: Mr Brindisi is a 59 year old with a history of ischemic/non ischemic CM  and CAD 1 vessel chronically occluded RCA. Strong family history of CM with brother & father had heart transplant.    He was last seen in the HF clinic in 2015 and had stopped taking all medications. He started taking different vitamins.   On 03/03/20  he had am appointment with a cardiologist in [facility] because he was not feeling well. Says he was short of breath and weak.  He was started on carvedilol 25 mg twice a day, lisinopril 10 mg , and spironolactone 25 mg daily. Soon after starting he felt terrible and felt like he was going to pass out.   Admitted in shock SBP in 70-80s.CCM consulted for admit. WBC elevated. Bld cultures obtained and were negative. Started cefepime + vanc and later stopped. Presentation felt to be consistent with cardiogenic shock. Placed on norep drip to maintain BP. HF Team consulted due to suspected cardiogenic shock. Admitted to ICU with norepi titrated to maintain SBP + milrinone was added. Diuresed with IV lasix with gradual improvement. As he improved norepi + milrinone weaned off.    HF meds initiated but will need to hold off on bb with cardiogenic shock. He was also placed on amiodarone to suppress PVCs. Plan to stop in a few weeks.   We will check ECHO in 3 months after HF meds optimized. Plan to follow closely in the HF clinic. He was provided with a letter to remain out of work. Referred to HF Paramedicine program for follow up.   1. A/C Systolic HF-->Cardiogenic Shock  - Has known ICM/NIMC 1V CAD  with chronically occluded RCA/familial brother/dad heart transplant. Presented in cardiogenic shock  --ECHO EF 20-25% severe MR.   - Was on norepi + milrinone. Also had volume overload and required IV diuresis.  As he improved norepi and milrinone gradually weaned off. - R/LHC showed stable CAD (chronically occluded distal RCA and moderate non-obstructive CAD elsewhere), low filling pressures and normal cardiac output on milrinone 0.25. Findings c/w NICM.  - CVP followed and on the day discharge CVP was down to 3. He was not discharged on lasix.  - Continue Entresto 24-26 bid.  - Continue Spiro 25 mg daily  - Continue digoxin 0.125 mg daily. Dig level ok (0.4)  - No b-blocker yet with recent shock  2. Hyperkalemia/hypokalemia - K supp as needed.    3. CAD  -1V chronically occluded RCA - Repeat LHC 6/14 w/ stable disease. Chronically occluded distal RCA and moderate non-obstructive CAD elsewhere -Continue asa 81 mg daily.  -Continue atorvastatin 40   4. Suspect AKI in the setting of shock  - Creatinine 1.7 on admit.  - resolved  7. Elevated LFTs -Suspect in the setting of shock.  - LFTs are improving   8. MR, severe  - likely functional   9. PVCs - suspect increased ectopy from milrinone - suppressed w/ amiodarone  - continue amio for 4-8 weeks then stop.  - keep K> 4.0 Mg > 2.0   Discharge Vitals:  Blood pressure 94/61, pulse 76, temperature 97.9 F (36.6 C), temperature source Oral, resp. rate 19, height 5' 7.5" (1.715 m), weight 71.4 kg, SpO2 98 %.  Labs: Lab Results  Component Value Date   WBC 9.6 03/13/2020   HGB 15.3 03/13/2020   HCT 47.3 03/13/2020   MCV 84.9 03/13/2020   PLT 254 03/13/2020    Recent Labs  Lab 03/11/20 0400 03/12/20 0441 03/13/20 0416  NA 138   < > 140  K 4.3   < > 4.5  CL 104   < > 105  CO2 23   < > 26  BUN 15   < > 15  CREATININE 1.14   < > 1.15  CALCIUM 8.6*   < > 9.0  PROT 4.7*  --   --   BILITOT 0.3  --   --   ALKPHOS 46   --   --   ALT 99*  --   --   AST 29  --   --   GLUCOSE 175*   < > 90   < > = values in this interval not displayed.   Lab Results  Component Value Date   CHOL 179 03/06/2020   HDL 42 03/06/2020   LDLCALC 119 (H) 03/06/2020   TRIG 91 03/06/2020   BNP (last 3 results) Recent Labs    03/04/20 2224  BNP 1,300.0*    ProBNP (last 3 results) No results for input(s): PROBNP in the last 8760 hours.   Diagnostic Studies/Procedures    ECHO 02/2020 EF 20-25% RV normal. Severe MR   R/LHC 6/14/Conclusion    Prox RCA lesion is 50% stenosed.  Dist RCA lesion is 100% stenosed.  Mid Cx lesion is 30% stenosed.  Mid LAD lesion is 40% stenosed.  1st Mrg lesion is 30% stenosed.  Findings:  On milrinone 0.25 mcg/kg/min  Ao = 86/61 (72) LV = 87/7 RA = 1 RV = 28/1 PA = 27/10 (18) PCW = 10 Fick cardiac output/index = 7.4/4.0 PVR = 1.0 WU SVR = 767 Ao sat = 95% PA sat = 75%, 77% SVC sat = 81%  Assessment: 1. CAD with chronically occluded distal RCA and moderate non-obstructive CAD elsewhere 2. Severe primarily non-ischemic CM EF 20% 3. Low filling pressures and normal cardiac output on milrinone 0.25  Discharge Medications   Allergies as of 03/13/2020      Reactions   Avocado Nausea Only   Shellfish Allergy Nausea And Vomiting      Medication List    STOP taking these medications   carvedilol 25 MG tablet Commonly known as: COREG   [facility] Vitamin D3 250 MCG (10000 UT) Caps Generic drug: Cholecalciferol   furosemide 20 MG tablet Commonly known as: LASIX   lisinopril 10 MG tablet Commonly known as: ZESTRIL   lisinopril 20 MG tablet Commonly known as: ZESTRIL   potassium chloride SA 20 MEQ tablet Commonly known as: KLOR-CON   vitamin C 1000 MG tablet   vitamin E 600 UNIT capsule     TAKE these medications   amiodarone 200 MG tablet Commonly known as: PACERONE Take 1 tablet (200 mg total) by mouth daily.   aspirin EC 81 MG tablet Take 1  tablet (81 mg total) by mouth daily.   atorvastatin 40 MG tablet Commonly known as: LIPITOR TAKE 1 TABLET DAILY (REPLACES SIMVASTATIN)   CENTRUM ULTRA MENS PO Take 1 tablet by mouth daily.   digoxin 0.125 MG tablet Commonly known as: LANOXIN Take 1  tablet (0.125 mg total) by mouth daily.   sacubitril-valsartan 24-26 MG Commonly known as: ENTRESTO Take 1 tablet by mouth 2 (two) times daily.   spironolactone 25 MG tablet Commonly known as: ALDACTONE Take 1 tablet (25 mg total) by mouth daily.       Disposition   The patient will be discharged in stable condition to home. Discharge Instructions    (HEART FAILURE PATIENTS) Call MD:  Anytime you have any of the following symptoms: 1) 3 pound weight gain in 24 hours or 5 pounds in 1 week 2) shortness of breath, with or without a dry hacking cough 3) swelling in the hands, feet or stomach 4) if you have to sleep on extra pillows at night in order to breathe.   Complete by: As directed    Diet - low sodium heart healthy   Complete by: As directed    Heart Failure patients record your daily weight using the same scale at the same time of day   Complete by: As directed    Increase activity slowly   Complete by: As directed       Follow-up Information    [facility] Follow up on 03/26/2020.   Specialty: Cardiology Why: at [address] Contact information: [address] [id number] Wilhemina Bonito [place] [address] [phone]                Duration of Discharge Encounter: Greater than 35 minutes   Signed, Tonye Becket NP-C  03/13/2020, 8:45 AM

## 2020-03-17 ENCOUNTER — Encounter (HOSPITAL_COMMUNITY): Payer: Self-pay

## 2020-03-19 ENCOUNTER — Other Ambulatory Visit (HOSPITAL_COMMUNITY): Payer: Self-pay | Admitting: *Deleted

## 2020-03-19 ENCOUNTER — Other Ambulatory Visit (HOSPITAL_COMMUNITY): Payer: Self-pay

## 2020-03-19 MED ORDER — ATORVASTATIN CALCIUM 40 MG PO TABS: ORAL_TABLET | ORAL | 2 refills | Status: DC

## 2020-03-19 MED ORDER — SACUBITRIL-VALSARTAN 24-26 MG PO TABS: 1.0000 | ORAL_TABLET | Freq: Two times a day (BID) | ORAL | 3 refills | Status: DC

## 2020-03-19 MED ORDER — DIGOXIN 125 MCG PO TABS: 0.1250 mg | ORAL_TABLET | Freq: Every day | ORAL | 3 refills | Status: DC

## 2020-03-19 MED ORDER — ATORVASTATIN CALCIUM 40 MG PO TABS: ORAL_TABLET | ORAL | 0 refills | Status: DC

## 2020-03-19 MED ORDER — AMIODARONE HCL 200 MG PO TABS: 200.0000 mg | ORAL_TABLET | Freq: Every day | ORAL | 3 refills | Status: DC

## 2020-03-19 MED ORDER — SPIRONOLACTONE 25 MG PO TABS: 25.0000 mg | ORAL_TABLET | Freq: Every day | ORAL | 3 refills | Status: DC

## 2020-03-19 NOTE — Progress Notes (Signed)
Paramedicine Encounter    Patient ID: Zaydin Billey, male    DOB: [birth year], 59 y.o.   MRN: [id number]   Patient Care Team: Patient, No Pcp Per as PCP - General (General Practice)  Patient Active Problem List   Diagnosis Date Noted  . Cardiogenic shock ([place]) 03/05/2020  . Hypotension 03/05/2020  . Hyperkalemia 03/05/2020  . Elevated WBC count 03/05/2020  . CKD (chronic kidney disease), stage III 03/05/2020  . Chronic bilateral pleural effusions   . Hyperlipidemia LDL goal <70 09/18/2014  . CAD, NATIVE VESSEL 07/22/2009  . SYSTOLIC HEART FAILURE, CHRONIC 07/22/2009  . HYPERTENSION 07/07/2009  . CAD 07/07/2009  . CHF 07/07/2009  . TACHYCARDIA 07/07/2009    Current Outpatient Medications:  .  aspirin EC 81 MG tablet, Take 1 tablet (81 mg total) by mouth daily., Disp: 90 tablet, Rfl: 3 .  Multiple Vitamins-Minerals (CENTRUM ULTRA MENS PO), Take 1 tablet by mouth daily. , Disp: , Rfl:  .  amiodarone (PACERONE) 200 MG tablet, Take 1 tablet (200 mg total) by mouth daily., Disp: 90 tablet, Rfl: 3 .  atorvastatin (LIPITOR) 40 MG tablet, TAKE 1 TABLET DAILY (REPLACES SIMVASTATIN), Disp: 30 tablet, Rfl: 0 .  digoxin (LANOXIN) 0.125 MG tablet, Take 1 tablet (0.125 mg total) by mouth daily., Disp: 90 tablet, Rfl: 3 .  sacubitril-valsartan (ENTRESTO) 24-26 MG, Take 1 tablet by mouth 2 (two) times daily., Disp: 180 tablet, Rfl: 3 .  spironolactone (ALDACTONE) 25 MG tablet, Take 1 tablet (25 mg total) by mouth daily., Disp: 90 tablet, Rfl: 3 Allergies  Allergen Reactions  . Avocado Nausea Only  . Shellfish Allergy Nausea And Vomiting      Social History   Socioeconomic History  . Marital status: Single    Spouse name: Not on file  . Number of children: Not on file  . Years of education: Not on file  . Highest education level: Not on file  Occupational History  . Not on file  Tobacco Use  . Smoking status: Never Smoker  . Smokeless tobacco: Never Used  Vaping Use  . Vaping Use:  Never assessed  Substance and Sexual Activity  . Alcohol use: Not on file  . Drug use: Not on file  . Sexual activity: Not on file  Other Topics Concern  . Not on file  Social History Narrative  . Not on file   Social Determinants of Health   Financial Resource Strain:   . Difficulty of Paying Living Expenses:   Food Insecurity:   . Worried About Charity fundraiser in the Last Year:   . Arboriculturist in the Last Year:   Transportation Needs:   . Film/video editor (Medical):   Marland Kitchen Lack of Transportation (Non-Medical):   Physical Activity:   . Days of Exercise per Week:   . Minutes of Exercise per Session:   Stress:   . Feeling of Stress :   Social Connections:   . Frequency of Communication with Friends and Family:   . Frequency of Social Gatherings with Friends and Family:   . Attends Religious Services:   . Active Member of Clubs or Organizations:   . Attends Archivist Meetings:   Marland Kitchen Marital Status:   Intimate Partner Violence:   . Fear of Current or Ex-Partner:   . Emotionally Abused:   Marland Kitchen Physically Abused:   . Sexually Abused:     Physical Exam      Future Appointments  Date Time Provider  Department Center  03/26/2020 10:20 AM Bensimhon, Bevelyn Buckles, MD [facility] None    BP 106/69 (BP Location: Left Arm, Patient Position: Sitting, Cuff Size: Normal)   Pulse 80   Resp 18   Wt 147 lb (66.7 kg)   SpO2 98%   BMI 22.68 kg/m   Weight yesterday- n/a Last visit weight- n/a   Mr Sadler was seen at home today for our initial visit. He reported feeling well, denying chest pain, Sob, headache, dizziness, orthopnea, fever or cough since being discharged from the hospital. He stated he has been compliant with his medications however he did not have atorvastatin which was present on his medication list. I contacted the HF clinic and had a 30 day prescription sent to a local pharmacy and then a 90 day sent to [facility] for mail order. Mr  Propes was explained that he should expect a call from his local pharmacy so he can start taking this as soon as possible. He was concerned about getting his short term disability paperwork submitted so I contacted the HF clinic and they advised the paperwork was submitted today. We spent the majority of our time going over his medications and explaining the purpose of each. He would like to have a pillbox however I did not have one today. I will bring one when I return next week.     Jacqualine Code, EMT 03/19/20  ACTION: Home visit completed Next visit planned for 1 week

## 2020-03-26 ENCOUNTER — Ambulatory Visit (HOSPITAL_COMMUNITY)
Admit: 2020-03-26 | Discharge: 2020-03-26 | Disposition: A | Discharge status: Home / Self Care | Payer: BC Managed Care – PPO | Attending: Internal Medicine | Admitting: Internal Medicine

## 2020-03-26 ENCOUNTER — Other Ambulatory Visit: Payer: Self-pay

## 2020-03-26 ENCOUNTER — Other Ambulatory Visit (HOSPITAL_COMMUNITY): Payer: Self-pay

## 2020-03-26 ENCOUNTER — Encounter (HOSPITAL_COMMUNITY): Payer: Self-pay | Admitting: Internal Medicine

## 2020-03-26 VITALS — BP 113/82 | HR 95 | Ht 67.5 in | Wt 162.8 lb

## 2020-03-26 DIAGNOSIS — I5022 Chronic systolic (congestive) heart failure: Secondary | ICD-10-CM

## 2020-03-26 DIAGNOSIS — Z8249 Family history of ischemic heart disease and other diseases of the circulatory system: Secondary | ICD-10-CM | POA: Insufficient documentation

## 2020-03-26 DIAGNOSIS — I493 Ventricular premature depolarization: Secondary | ICD-10-CM | POA: Diagnosis not present

## 2020-03-26 DIAGNOSIS — Z79899 Other long term (current) drug therapy: Secondary | ICD-10-CM | POA: Insufficient documentation

## 2020-03-26 DIAGNOSIS — I2510 Atherosclerotic heart disease of native coronary artery without angina pectoris: Secondary | ICD-10-CM | POA: Diagnosis not present

## 2020-03-26 DIAGNOSIS — Z7982 Long term (current) use of aspirin: Secondary | ICD-10-CM | POA: Diagnosis not present

## 2020-03-26 DIAGNOSIS — I110 Hypertensive heart disease with heart failure: Secondary | ICD-10-CM | POA: Insufficient documentation

## 2020-03-26 DIAGNOSIS — I428 Other cardiomyopathies: Secondary | ICD-10-CM | POA: Insufficient documentation

## 2020-03-26 LAB — BASIC METABOLIC PANEL
Anion gap: 10 (ref 5–15)
BUN: 14 mg/dL (ref 6–20)
CO2: 23 mmol/L (ref 22–32)
Calcium: 9.4 mg/dL (ref 8.9–10.3)
Chloride: 106 mmol/L (ref 98–111)
Creatinine, Ser: 1.51 mg/dL — ABNORMAL HIGH (ref 0.61–1.24)
GFR calc Af Amer: 59 mL/min — ABNORMAL LOW (ref >60)
GFR calc non Af Amer: 51 mL/min — ABNORMAL LOW (ref >60)
Glucose, Bld: 100 mg/dL — ABNORMAL HIGH (ref 70–99)
Potassium: 5.1 mmol/L (ref 3.5–5.1)
Sodium: 139 mmol/L (ref 135–145)

## 2020-03-26 LAB — BRAIN NATRIURETIC PEPTIDE: B Natriuretic Peptide: 338.8 pg/mL — ABNORMAL HIGH (ref 0.0–100.0)

## 2020-03-26 MED ORDER — CARVEDILOL 3.125 MG PO TABS
3.1250 mg | ORAL_TABLET | Freq: Two times a day (BID) | ORAL | 3 refills | Status: DC
Start: 2020-03-26 — End: 2020-04-09

## 2020-03-26 MED ORDER — SACUBITRIL-VALSARTAN 24-26 MG PO TABS: 1.0000 | ORAL_TABLET | Freq: Two times a day (BID) | ORAL | 3 refills | Status: DC

## 2020-03-26 NOTE — Progress Notes (Signed)
Paramedicine Encounter   Patient ID: Zachary Arias , male,   DOB: 02/01/1961,58 y.o.,  MRN: [id number]  Mr Bias was seen in the HF clinic today and reported feeling well. He is having trouble affording Entresto so I will speak with [name] to see what his options are. Per [name], he to to start low dose carvedilol today. I will have it sent to the online pharmacy for recurring deliveries. Mr Pineda was understanding and agreeable. I will follow up next week.   Jacqualine Code, EMT 03/26/2020   ACTION: Next visit planned for 1 week

## 2020-03-26 NOTE — Progress Notes (Signed)
CSW met with pt in the clinic to introduce myself as I had not been able to meet pt when we enrolled in paramedicine.  Paramedic reports that pt having trouble with [facility] copays and asked for assistance.  Pt has commercial insurance so can use a copay card.  CSW assisted in activating copay card and called [facility] mail order delivery.  They report they cannot take copay card and pt would have to get through local pharmacy- CSW requested that clinic staff send to pt requested pharmacy and provided pt with the copay card.  Pt reports no other needs at this time   CSW will continue to follow and assist as needed  Jorge Ny, [facility] Desk#: [phone] Cell#: [phone]

## 2020-03-26 NOTE — Progress Notes (Signed)
ADVANCED HF CLINIC NOTE   Patient ID: Graham Hyun, male   DOB: March 30, 1961, 59 y.o.   MRN: [id number] PCP: 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 Cath with 1-v CAD with chronically occluded RCA. MRI in January 2011 with EF 31% with inferior scar.   Echo April 2011 showed EF 30-35%. Echo August 2013 showed EF 45-50% with inferior and inferoseptal hypokinesis  Brother had heart transplant in [place] several months ago. Dad also had previous heart transplant.   He was lost to f/u since 2015. Admitted 03/05/20 with cardiogenic shock after stopping meds for months and using supplements. SBP in 70-80s. ECHO EF 20-25% RV normal. Severe MR. Having frequent PVCs. Placed on milrinone to suppress PVCs  Cath 03/10/20. R/LHC showed stable CAD (chronically occluded distal RCA and moderate non-obstructive CAD elsewhere), low filling pressures and normal cardiac output on milrinone 0.25.   Here for post-hospital f/u with paramedicine. Feeling much better. Able to get around without difficulty. No CP or SOB. Had one episode of dizziness when he stood up too quickly. Taking all medicines without difficulty.   SH: Lives in [place], nonsmoker, works in a warehouse.  FH: No premature CAD  ROS: All systems negative except as listed in HPI, PMH and Problem List.  Past Medical History:  Diagnosis Date   Congestive heart failure (CHF) (HCC)    secondary to ischemic cardiomyopathy with an ejection fraction of 25-30%   Coronary artery disease    History of hypertension    Inguinal hernia 2005   History of left inguinal hernia, status post repair in 2005   Tachycardia 2010     Unexplained tachycardia during hospitalization    Current Outpatient Medications  Medication Sig Dispense Refill   amiodarone (PACERONE) 200 MG tablet Take 1 tablet (200 mg total) by mouth daily. 90 tablet 3    aspirin EC 81 MG tablet Take 1 tablet (81 mg total) by mouth daily. 90 tablet 3   atorvastatin (LIPITOR) 40 MG tablet TAKE 1 TABLET DAILY (REPLACES SIMVASTATIN) 30 tablet 0   Multiple Vitamins-Minerals (CENTRUM ULTRA MENS PO) Take 1 tablet by mouth daily.      sacubitril-valsartan (ENTRESTO) 24-26 MG Take 1 tablet by mouth 2 (two) times daily. 180 tablet 3   spironolactone (ALDACTONE) 25 MG tablet Take 1 tablet (25 mg total) by mouth daily. 90 tablet 3   digoxin (LANOXIN) 0.125 MG tablet Take 1 tablet (0.125 mg total) by mouth daily. (Patient not taking: Reported on 03/26/2020) 90 tablet 3   No current facility-administered medications for this encounter.     PHYSICAL EXAM: Vitals:   03/26/20 1036  BP: 113/82  Pulse: 95  SpO2: 100%  Weight: 73.8 kg (162 lb 12.8 oz)  Height: 5' 7.5" (1.715 m)    General:  Well appearing. No resp difficulty HEENT: normal Neck: supple. no JVD. Carotids 2+ bilat; no bruits. No lymphadenopathy or thryomegaly appreciated. Cor: PMI nondisplaced. Regular rate & rhythm. No rubs, gallops or murmurs. Lungs: clear Abdomen: soft, nontender, nondistended. No hepatosplenomegaly. No bruits or masses. Good bowel sounds. Extremities: no cyanosis, clubbing, rash, edema Neuro: alert & orientedx3, cranial nerves grossly intact. moves all 4 extremities w/o difficulty. Affect pleasant  ASSESSMENT & PLAN:  1. Chronic systolic CHF:  - Cath with 1-v CAD with chronically occluded RCA. MRI in January 2011 with EF 31% with inferior scar.  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  - No f/u between to 2015-2021 - Echo 6/21 EF 20-25% severe central MR. Repeat cath 6/21 with stable CAD - Now NYHA II - Volume status ok  - Continue Entresto 24/26 bid - will not titrate today due to recent dizziness. Will enroll in PharmD titration clinic - Continue digoxin 0.125 - Continue spiro 25  - Add low-dose carvedilol    - Labs today - Repeat echo in 3-4 months to determine need for ICD - He will f/u with his brother to see if he has had genetic testing  2. CAD:  - Occluded RCA on cath - No chest pain - Continue asa 81 mg daily.  - Continue atorvastatin 40   3. PVCs - continue amio for now. Will stop in a few months  Arvilla Meres MD 03/26/2020 10:53 AM

## 2020-03-26 NOTE — Telephone Encounter (Signed)
I spoke with [name] with paramedicine to go over how Zachary Arias should be receiving his medication. He requested that we get all the patient's prescriptions transferred to [facility] for delivery since his insurance does not require that he use [facility]. [facility] also does not accept co-pay cards.   I called and requested the profile transfer from [facility], while providing them the co-pay card billing information.  Archer Asa, CPhT

## 2020-03-26 NOTE — Patient Instructions (Addendum)
Labs done today. We will contact you only if your labs are abnormal.  START Carvedilol(coreg) 3.125mg (1 tablet) by mouth 2 times daily.  A co pay card was provided to you today in office to help with your Entresto.  No other medication changes were made. Please continue all other medications as prescribed.   Your physician recommends that you schedule a follow-up appointment in: 3-4 weeks for a Pharmacy Clinic visit and in 4 months for an appointment with Dr. Gala Romney with an echo prior your appointment. Please contact our office in September to schedule an October appointment.  Your physician has requested that you have an echocardiogram. Echocardiography is a painless test that uses sound waves to create images of your heart. It provides your doctor with information about the size and shape of your heart and how well your heart's chambers and valves are working. This procedure takes approximately one hour. There are no restrictions for this procedure.  At the [facility], you and your health needs are our priority. As part of our continuing mission to provide you with exceptional heart care, we have created designated Provider Care Teams. These Care Teams include your primary Cardiologist (physician) and Advanced Practice Providers (APPs- Physician Assistants and Nurse Practitioners) who all work together to provide you with the care you need, when you need it.   You may see any of the following providers on your designated Care Team at your next follow up: Marland Kitchen Dr Arvilla Meres . Dr Marca Ancona . Tonye Becket, NP . Robbie Lis, PA . [name], PharmD   Please be sure to bring in all your medications bottles to every appointment.

## 2020-03-28 ENCOUNTER — Telehealth (HOSPITAL_COMMUNITY): Payer: Self-pay | Admitting: *Deleted

## 2020-03-28 DIAGNOSIS — I5022 Chronic systolic (congestive) heart failure: Secondary | ICD-10-CM

## 2020-03-28 MED ORDER — SPIRONOLACTONE 25 MG PO TABS: 12.5000 mg | ORAL_TABLET | Freq: Every day | ORAL | 3 refills | Status: DC

## 2020-03-28 NOTE — Telephone Encounter (Signed)
   Zachary Arias, [place]  03/28/2020 5:05 PM EDT Back to Top    Pt aware and voiced understanding lab appt scheduled for 7/7 at 12pm   [name], MD  03/28/2020 9:58 AM EDT     Drop spiro to 12.5. Recheck BMET next week.

## 2020-03-28 NOTE — Telephone Encounter (Signed)
-----   Message from [name], MD sent at 03/28/2020  9:58 AM EDT ----- Drop spiro to 12.5. Recheck BMET next week.

## 2020-04-02 ENCOUNTER — Ambulatory Visit (HOSPITAL_COMMUNITY)
Admission: RE | Admit: 2020-04-02 | Discharge: 2020-04-02 | Disposition: A | Discharge status: Home / Self Care | Payer: BC Managed Care – PPO | Source: Ambulatory Visit | Attending: Internal Medicine | Admitting: Internal Medicine

## 2020-04-02 ENCOUNTER — Other Ambulatory Visit: Payer: Self-pay

## 2020-04-02 ENCOUNTER — Other Ambulatory Visit (HOSPITAL_COMMUNITY): Payer: Self-pay

## 2020-04-02 DIAGNOSIS — I5022 Chronic systolic (congestive) heart failure: Secondary | ICD-10-CM | POA: Insufficient documentation

## 2020-04-02 LAB — BASIC METABOLIC PANEL
Anion gap: 9 (ref 5–15)
BUN: 11 mg/dL (ref 6–20)
CO2: 26 mmol/L (ref 22–32)
Calcium: 9 mg/dL (ref 8.9–10.3)
Chloride: 106 mmol/L (ref 98–111)
Creatinine, Ser: 1.31 mg/dL — ABNORMAL HIGH (ref 0.61–1.24)
GFR calc Af Amer: >60 mL/min (ref >60)
GFR calc non Af Amer: >60 mL/min (ref >60)
Glucose, Bld: 91 mg/dL (ref 70–99)
Potassium: 3.9 mmol/L (ref 3.5–5.1)
Sodium: 141 mmol/L (ref 135–145)

## 2020-04-02 NOTE — Progress Notes (Signed)
Paramedicine Encounter    Patient ID: Zachary Arias, male    DOB: 1961-03-05, 59 y.o.   MRN: [id number]   Patient Care Team: Patient, No Pcp Per as PCP - General (General Practice)  Patient Active Problem List   Diagnosis Date Noted  . Cardiogenic shock (HCC) 03/05/2020  . Hypotension 03/05/2020  . Hyperkalemia 03/05/2020  . Elevated WBC count 03/05/2020  . CKD (chronic kidney disease), stage III 03/05/2020  . Chronic bilateral pleural effusions   . Hyperlipidemia LDL goal <70 09/18/2014  . CAD, NATIVE VESSEL 07/22/2009  . SYSTOLIC HEART FAILURE, CHRONIC 07/22/2009  . HYPERTENSION 07/07/2009  . CAD 07/07/2009  . CHF 07/07/2009  . TACHYCARDIA 07/07/2009    Current Outpatient Medications:  .  amiodarone (PACERONE) 200 MG tablet, Take 1 tablet (200 mg total) by mouth daily., Disp: 90 tablet, Rfl: 3 .  aspirin EC 81 MG tablet, Take 1 tablet (81 mg total) by mouth daily., Disp: 90 tablet, Rfl: 3 .  digoxin (LANOXIN) 0.125 MG tablet, Take 1 tablet (0.125 mg total) by mouth daily., Disp: 90 tablet, Rfl: 3 .  Multiple Vitamins-Minerals (CENTRUM ULTRA MENS PO), Take 1 tablet by mouth daily. , Disp: , Rfl:  .  sacubitril-valsartan (ENTRESTO) 24-26 MG, Take 1 tablet by mouth 2 (two) times daily., Disp: 60 tablet, Rfl: 3 .  spironolactone (ALDACTONE) 25 MG tablet, Take 0.5 tablets (12.5 mg total) by mouth daily., Disp: 45 tablet, Rfl: 3 .  atorvastatin (LIPITOR) 40 MG tablet, TAKE 1 TABLET DAILY (REPLACES SIMVASTATIN) (Patient not taking: Reported on 03/26/2020), Disp: 30 tablet, Rfl: 0 .  carvedilol (COREG) 3.125 MG tablet, Take 1 tablet (3.125 mg total) by mouth 2 (two) times daily. (Patient not taking: Reported on 04/02/2020), Disp: 180 tablet, Rfl: 3 Allergies  Allergen Reactions  . Avocado Nausea Only  . Shellfish Allergy Nausea And Vomiting      Social History   Socioeconomic History  . Marital status: Single    Spouse name: Not on file  . Number of children: Not on file  .  Years of education: Not on file  . Highest education level: Not on file  Occupational History  . Not on file  Tobacco Use  . Smoking status: Never Smoker  . Smokeless tobacco: Never Used  Vaping Use  . Vaping Use: Never assessed  Substance and Sexual Activity  . Alcohol use: Not on file  . Drug use: Not on file  . Sexual activity: Not on file  Other Topics Concern  . Not on file  Social History Narrative  . Not on file   Social Determinants of Health   Financial Resource Strain:   . Difficulty of Paying Living Expenses:   Food Insecurity:   . Worried About Programme researcher, broadcasting/film/video in the Last Year:   . Barista in the Last Year:   Transportation Needs:   . Freight forwarder (Medical):   Marland Kitchen Lack of Transportation (Non-Medical):   Physical Activity:   . Days of Exercise per Week:   . Minutes of Exercise per Session:   Stress:   . Feeling of Stress :   Social Connections:   . Frequency of Communication with Friends and Family:   . Frequency of Social Gatherings with Friends and Family:   . Attends Religious Services:   . Active Member of Clubs or Organizations:   . Attends Banker Meetings:   Marland Kitchen Marital Status:   Intimate Partner Violence:   . Fear  of Current or Ex-Partner:   . Emotionally Abused:   Marland Kitchen Physically Abused:   . Sexually Abused:     Physical Exam Cardiovascular:     Rate and Rhythm: Normal rate and regular rhythm.     Pulses: Normal pulses.  Pulmonary:     Effort: Pulmonary effort is normal.     Breath sounds: Normal breath sounds.  Musculoskeletal:        General: Normal range of motion.     Right lower leg: Edema present.     Left lower leg: Edema present.  Skin:    General: Skin is warm and dry.     Capillary Refill: Capillary refill takes less than 2 seconds.  Neurological:     Mental Status: He is alert and oriented to person, place, and time.  Psychiatric:        Mood and Affect: Mood normal.         Future  Appointments  Date Time Provider Department Center  04/22/2020  2:00 PM [facility] None    BP 118/82 (BP Location: Left Arm, Patient Position: Sitting, Cuff Size: Normal)   Pulse 78   Resp 18   Wt 153 lb 12.8 oz (69.8 kg)   SpO2 99%   BMI 23.73 kg/m   Weight yesterday- 155 lb Last visit weight- 162.8 lb  Mr Cammack was seen at home today and reported feeling well. He denied chest pain, SOB, headache, dizziness, orthopnea, fever or cough over the past week. He stated he has been compliant with his medications over the past week and his weight has been stable. His medications were verified but he still has not received atorvastatin or carvedilol from [facility]. I contacted the pharmacy and they stated they would be able to deliver it tomorrow. I obtained the co-pay card information for [facility] and they confirmed that the cost was lowered to $10. He asked me about owing the hospital $2,900 for recent services so I referred him to the financial counseling number on his MyChart. He stated he would call them to find out how to move forward with payment or debt forgiveness. I will follow up next week.   Jacqualine Code, EMT 04/02/20  ACTION: Home visit completed Next visit planned for 1 week

## 2020-04-07 ENCOUNTER — Other Ambulatory Visit (HOSPITAL_COMMUNITY): Payer: Self-pay | Admitting: Internal Medicine

## 2020-04-09 ENCOUNTER — Other Ambulatory Visit (HOSPITAL_COMMUNITY): Payer: Self-pay

## 2020-04-09 ENCOUNTER — Telehealth (HOSPITAL_COMMUNITY): Payer: Self-pay | Admitting: Cardiology

## 2020-04-09 MED ORDER — DIGOXIN 125 MCG PO TABS: 125.0000 ug | ORAL_TABLET | Freq: Every day | ORAL | 3 refills | Status: DC

## 2020-04-09 MED ORDER — AMIODARONE HCL 200 MG PO TABS: 200.0000 mg | ORAL_TABLET | Freq: Every day | ORAL | 3 refills | Status: DC

## 2020-04-09 MED ORDER — SPIRONOLACTONE 25 MG PO TABS: 12.5000 mg | ORAL_TABLET | Freq: Every day | ORAL | 3 refills | Status: DC

## 2020-04-09 MED ORDER — ATORVASTATIN CALCIUM 40 MG PO TABS: ORAL_TABLET | ORAL | 3 refills | Status: DC

## 2020-04-09 MED ORDER — SACUBITRIL-VALSARTAN 24-26 MG PO TABS: 1.0000 | ORAL_TABLET | Freq: Two times a day (BID) | ORAL | 3 refills | Status: DC

## 2020-04-09 MED ORDER — CARVEDILOL 3.125 MG PO TABS: 3.1250 mg | ORAL_TABLET | Freq: Two times a day (BID) | ORAL | 3 refills | Status: DC

## 2020-04-09 NOTE — Progress Notes (Signed)
Paramedicine Encounter    Patient ID: Zachary Arias, male    DOB: 09/12/1961, 59 y.o.   MRN: [id number]   Patient Care Team: Patient, No Pcp Per as PCP - General (General Practice)  Patient Active Problem List   Diagnosis Date Noted   Cardiogenic shock (HCC) 03/05/2020   Hypotension 03/05/2020   Hyperkalemia 03/05/2020   Elevated WBC count 03/05/2020   CKD (chronic kidney disease), stage III 03/05/2020   Chronic bilateral pleural effusions    Hyperlipidemia LDL goal <70 09/18/2014   CAD, NATIVE VESSEL 07/22/2009   SYSTOLIC HEART FAILURE, CHRONIC 07/22/2009   HYPERTENSION 07/07/2009   CAD 07/07/2009   CHF 07/07/2009   TACHYCARDIA 07/07/2009    Current Outpatient Medications:    Multiple Vitamins-Minerals (CENTRUM ULTRA MENS PO), Take 1 tablet by mouth daily. , Disp: , Rfl:    amiodarone (PACERONE) 200 MG tablet, Take 1 tablet (200 mg total) by mouth daily., Disp: 30 tablet, Rfl: 3   aspirin EC 81 MG tablet, Take 1 tablet (81 mg total) by mouth daily., Disp: 90 tablet, Rfl: 3   atorvastatin (LIPITOR) 40 MG tablet, TAKE 1 TABLET DAILY (REPLACES SIMVASTATIN), Disp: 30 tablet, Rfl: 3   carvedilol (COREG) 3.125 MG tablet, Take 1 tablet (3.125 mg total) by mouth 2 (two) times daily., Disp: 60 tablet, Rfl: 3   digoxin (LANOXIN) 0.125 MG tablet, Take 1 tablet (125 mcg total) by mouth daily., Disp: 30 tablet, Rfl: 3   sacubitril-valsartan (ENTRESTO) 24-26 MG, Take 1 tablet by mouth 2 (two) times daily., Disp: 60 tablet, Rfl: 3   spironolactone (ALDACTONE) 25 MG tablet, Take 0.5 tablets (12.5 mg total) by mouth daily., Disp: 15 tablet, Rfl: 3 Allergies  Allergen Reactions   Avocado Nausea Only   Shellfish Allergy Nausea And Vomiting      Social History   Socioeconomic History   Marital status: Single    Spouse name: Not on file   Number of children: Not on file   Years of education: Not on file   Highest education level: Not on file  Occupational  History   Not on file  Tobacco Use   Smoking status: Never Smoker   Smokeless tobacco: Never Used  Vaping Use   Vaping Use: Never assessed  Substance and Sexual Activity   Alcohol use: Not on file   Drug use: Not on file   Sexual activity: Not on file  Other Topics Concern   Not on file  Social History Narrative   Not on file   Social Determinants of Health   Financial Resource Strain:    Difficulty of Paying Living Expenses:   Food Insecurity:    Worried About Running Out of Food in the Last Year:    Barista in the Last Year:   Transportation Needs:    Freight forwarder (Medical):    Lack of Transportation (Non-Medical):   Physical Activity:    Days of Exercise per Week:    Minutes of Exercise per Session:   Stress:    Feeling of Stress :   Social Connections:    Frequency of Communication with Friends and Family:    Frequency of Social Gatherings with Friends and Family:    Attends Religious Services:    Active Member of Clubs or Organizations:    Attends Banker Meetings:    Marital Status:   Intimate Partner Violence:    Fear of Current or Ex-Partner:    Emotionally Abused:  Physically Abused:    Sexually Abused:     Physical Exam Cardiovascular:     Rate and Rhythm: Normal rate and regular rhythm.     Pulses: Normal pulses.  Pulmonary:     Effort: Pulmonary effort is normal.     Breath sounds: Normal breath sounds.  Musculoskeletal:        General: Normal range of motion.     Right lower leg: No edema.     Left lower leg: No edema.  Skin:    General: Skin is warm and dry.     Capillary Refill: Capillary refill takes less than 2 seconds.  Neurological:     Mental Status: He is alert and oriented to person, place, and time.  Psychiatric:        Mood and Affect: Mood normal.         Future Appointments  Date Time Provider Department Center  04/22/2020  2:00 PM [facility] None     BP 110/64 (BP Location: Left Arm, Patient Position: Sitting, Cuff Size: Normal)    Pulse 75    Resp 16    Wt 153 lb 4.8 oz (69.5 kg)    SpO2 95%    BMI 23.66 kg/m   Weight yesterday- 153 lb Last visit weight- 153.8 lb  Zachary Arias was seen at home today and reported feeling well. He denied chest pain, SOB, headache, dizziness, orthopnea, fever or cough today. He stated he has been compliant with his medications however there was a mistake at the pharmacy which left him feeling unwell for one day. [facility] transferred [facility] profile from [facility] and transferred his old carvedilol dose of 25 mg BID rather than the 3.125 mg BID which he is currently prescribed. He stated he felt very tired and generally unwell after taking this so he stopped taking just that medication until I saw him today. I advised that he did the right thing but if he feels this way in the future he should call me directly rather than wait. He was understanding and agreeable. I contacted the pharmacy and explained the issue and advised the clinic would be sending all updated prescriptions today and they would need to be delivered as soon as possible. He has 3.125 mg carvedilol from [facility] which he can use for the next month but we will be working to get him switched over completely to [facility]. Zachary Arias was advised to call me as soon as his other medications are delivered so I can ensure they are correct. Lastly he would like to know when his return to work date is because his insurance company will need an updated letter if it is beyond August 1. I will relay this information to the clinic and have [name] address this when he returns.   Jacqualine Code, EMT 04/09/20  ACTION: Home visit completed Next visit planned for 1 week

## 2020-04-09 NOTE — Telephone Encounter (Signed)
Zachary Arias with paramedicine called to request updated rx's to be sent to local pharmacy, reports he will no longer use [facility]

## 2020-04-09 NOTE — Progress Notes (Signed)
HF Cardiologist: Dr. Gala Romney  HPI:  Zachary Arias is a 59 year old male gentlemen with history of HF due to mixed ischemic/no-ischemic CM (diagnosed 9/10) with EF 25-30% range. Cath with 1-v CAD with chronically occluded RCA. MRI in January 2011 with EF 31% with inferior scar.   Echo April 2011 showed EF 30-35%. Echo August 2013 showed EF 45-50% with inferior and inferoseptal hypokinesis.  Brother had heart transplant in [place] several months ago. Dad also had previous heart transplant.   He was lost to f/u since 2015. Admitted 03/05/20 with cardiogenic shockafter stopping meds for months and using supplements. SBP in 70-80s. ECHO EF 20-25% RV normal. Severe MR. Having frequent PVCs. Placed on milrinone to suppress PVCs.  Cath 03/10/20. R/LHC showed stable CAD (chronically occluded distal RCA and moderate non-obstructive CAD elsewhere), low filling pressures and normal cardiac output on milrinone 0.25.   Recently presented to HF Clinic with Dr. Gala Romney on 03/26/20. Came with paramedicine ([name]). Feeling much better. Able to get around without difficulty. No CP or SOB. Had one episode of dizziness when he stood up too quickly. Taking all medicines without difficulty.   Today he returns to HF clinic for pharmacist medication titration. At last visit with MD, carvedilol 3.125 mg BID was initiated. Spironolactone was decreased to 12.5 mg daily after BMET showed potassium level of 5.1. Repeat labs 1 week later showed improved potassium of 3.9. Overall he is feeling well today. He says that his pharmacy accidentally filled the wrong dose of carvedilol (25 mg BID instead of 3.125 mg BID) and he felt very dizzy after that change. However, he realized the mistake and has done well since changing to the correct dose of 3.125 mg BID. No chest pain or palpitations. Can walk 10 minutes before getting SOB. He is nervous about going back to work and is trying to get the date extended as his job is very physically  demanding. He is not taking any diuretic. His weight has been stable at home at 153 lbs. No LEE, PND or orthopnea. Taking all medications as prescribed and tolerating all medications.   HF Medications: Carvedilol 3.125 mg BID Entresto 24/26 mg BID Spironolactone 12.5 mg daily Digoxin 0.125 mg daily  Has the patient been experiencing any side effects to the medications prescribed?  no  Does the patient have any problems obtaining medications due to transportation or finances?   No - has Nurse, learning disability. Has copay card for [facility].   Understanding of regimen: good Understanding of indications: good Potential of compliance: good Patient understands to avoid NSAIDs. Patient understands to avoid decongestants.    Pertinent Lab Values (04/02/20): Marland Kitchen Serum creatinine 1.31, BUN 11, Potassium 3.9, Sodium 141, BNP 338.8 pg/mL (03/26/20)  Vital Signs: . Weight: 162.6 lbs (last clinic weight: 162.8 lbs) . Blood pressure: 132/86  . Heart rate: 76   Assessment: 1. Chronic systolic CHF:  - Cath with 1-v CAD with chronically occluded RCA. MRI in January 2011 with EF 31% with inferior scar.  - Suspect mixed ischemic/nonischemic cardiomyopathy with familial component  EF 45-50% on echo in 8/13.  - No f/u between to 2015-2021 - Echo 6/21 EF 20-25% severe central MR. Repeat cath 6/21 with stable CAD - NYHA II, Volume status ok  - Continue carvedilol 3.125 mg BID - Increase Entresto to 49/51 mg BID. Repeat BMET in 3 weeks.  - Continue spironolactone 12.5 mg daily. Decreased recently from 25 mg daily due to hyperkalemia.  - Continue digoxin 0.125 mg daily - Repeat  echo in 3-4 months to determine need for ICD  2. CAD:  - Occluded RCA on cath - No chest pain - Continue asa 81 mg daily.  - Continue atorvastatin 40   3. PVCs - Continue amiodarone for now. Per Dr. Gala Romney, will plan to stop in a few months   Plan: 1) Medication changes: Based on clinical presentation, vital signs and  recent labs will increase Entresto to 49/51 mg BID 2) Follow-up: 3 weeks with pharmacy clinic   [name], PharmD, BCPS, BCCP, CPP Heart Failure Clinic Pharmacist [phone]

## 2020-04-16 ENCOUNTER — Other Ambulatory Visit (HOSPITAL_COMMUNITY): Payer: Self-pay

## 2020-04-16 NOTE — Progress Notes (Signed)
Paramedicine Encounter    Patient ID: Zachary Arias, male    DOB: 03-20-1961, 59 y.o.   MRN: [id number]   Patient Care Team: Patient, No Pcp Per as PCP - General (General Practice)  Patient Active Problem List   Diagnosis Date Noted  . Cardiogenic shock (HCC) 03/05/2020  . Hypotension 03/05/2020  . Hyperkalemia 03/05/2020  . Elevated WBC count 03/05/2020  . CKD (chronic kidney disease), stage III 03/05/2020  . Chronic bilateral pleural effusions   . Hyperlipidemia LDL goal <70 09/18/2014  . CAD, NATIVE VESSEL 07/22/2009  . SYSTOLIC HEART FAILURE, CHRONIC 07/22/2009  . HYPERTENSION 07/07/2009  . CAD 07/07/2009  . CHF 07/07/2009  . TACHYCARDIA 07/07/2009    Current Outpatient Medications:  .  amiodarone (PACERONE) 200 MG tablet, Take 1 tablet (200 mg total) by mouth daily., Disp: 30 tablet, Rfl: 3 .  aspirin EC 81 MG tablet, Take 1 tablet (81 mg total) by mouth daily., Disp: 90 tablet, Rfl: 3 .  atorvastatin (LIPITOR) 40 MG tablet, TAKE 1 TABLET DAILY (REPLACES SIMVASTATIN), Disp: 30 tablet, Rfl: 3 .  carvedilol (COREG) 3.125 MG tablet, Take 1 tablet (3.125 mg total) by mouth 2 (two) times daily., Disp: 60 tablet, Rfl: 3 .  digoxin (LANOXIN) 0.125 MG tablet, Take 1 tablet (125 mcg total) by mouth daily., Disp: 30 tablet, Rfl: 3 .  Multiple Vitamins-Minerals (CENTRUM ULTRA MENS PO), Take 1 tablet by mouth daily. , Disp: , Rfl:  .  sacubitril-valsartan (ENTRESTO) 24-26 MG, Take 1 tablet by mouth 2 (two) times daily., Disp: 60 tablet, Rfl: 3 .  spironolactone (ALDACTONE) 25 MG tablet, Take 0.5 tablets (12.5 mg total) by mouth daily., Disp: 15 tablet, Rfl: 3 Allergies  Allergen Reactions  . Avocado Nausea Only  . Shellfish Allergy Nausea And Vomiting      Social History   Socioeconomic History  . Marital status: Single    Spouse name: Not on file  . Number of children: Not on file  . Years of education: Not on file  . Highest education level: Not on file  Occupational  History  . Not on file  Tobacco Use  . Smoking status: Never Smoker  . Smokeless tobacco: Never Used  Vaping Use  . Vaping Use: Never assessed  Substance and Sexual Activity  . Alcohol use: Not on file  . Drug use: Not on file  . Sexual activity: Not on file  Other Topics Concern  . Not on file  Social History Narrative  . Not on file   Social Determinants of Health   Financial Resource Strain:   . Difficulty of Paying Living Expenses:   Food Insecurity:   . Worried About Programme researcher, broadcasting/film/video in the Last Year:   . Barista in the Last Year:   Transportation Needs:   . Freight forwarder (Medical):   Marland Kitchen Lack of Transportation (Non-Medical):   Physical Activity:   . Days of Exercise per Week:   . Minutes of Exercise per Session:   Stress:   . Feeling of Stress :   Social Connections:   . Frequency of Communication with Friends and Family:   . Frequency of Social Gatherings with Friends and Family:   . Attends Religious Services:   . Active Member of Clubs or Organizations:   . Attends Banker Meetings:   Marland Kitchen Marital Status:   Intimate Partner Violence:   . Fear of Current or Ex-Partner:   . Emotionally Abused:   .  Physically Abused:   . Sexually Abused:     Physical Exam Cardiovascular:     Rate and Rhythm: Normal rate and regular rhythm.     Pulses: Normal pulses.  Pulmonary:     Effort: Pulmonary effort is normal.  Musculoskeletal:        General: Normal range of motion.     Right lower leg: No edema.     Left lower leg: No edema.  Skin:    General: Skin is warm and dry.     Capillary Refill: Capillary refill takes less than 2 seconds.  Neurological:     Mental Status: He is alert and oriented to person, place, and time.  Psychiatric:        Mood and Affect: Mood normal.         Future Appointments  Date Time Provider Department Center  04/22/2020  2:00 PM [facility] None    BP 114/82 (BP Location: Left Arm,  Patient Position: Sitting, Cuff Size: Normal)   Pulse 81   Resp 16   Wt 154 lb 9.6 oz (70.1 kg)   SpO2 98%   BMI 23.86 kg/m   Weight yesterday- 153.2 lb Last visit weight- 153.3 lb  [name] was seen at home today and reported feeling well. He denied chest pain, SOB, dizziness, orthopnea, fever or cough over the past week. He stated he gets mildly SOB with exertion but it resolves upon resting. He reported being compliant with his medications over the past week and his weight has been stable. He is concerned about his return to work date of August 1. He stated he does not think he is ready to return that quickly and would like an extension to September 1 before he should return. He stated he would need a letter sent to his short-term disability company regarding the extension to prevent his benefits from expiring. I relayed this information to Meredith Staggers, RN, at the HF Clinic and she advised she would handle it. His medications were verified and his pillbox was checked for accuracy. I will follow up next week.   Jacqualine Code, EMT 04/16/20  ACTION: Home visit completed Next visit planned for 1 week

## 2020-04-22 ENCOUNTER — Ambulatory Visit (HOSPITAL_COMMUNITY)
Admission: RE | Admit: 2020-04-22 | Discharge: 2020-04-22 | Disposition: A | Discharge status: Home / Self Care | Payer: BC Managed Care – PPO | Source: Ambulatory Visit | Attending: Cardiology | Admitting: Cardiology

## 2020-04-22 ENCOUNTER — Other Ambulatory Visit: Payer: Self-pay

## 2020-04-22 VITALS — BP 132/86 | HR 76 | Wt 162.6 lb

## 2020-04-22 DIAGNOSIS — Z79899 Other long term (current) drug therapy: Secondary | ICD-10-CM | POA: Diagnosis not present

## 2020-04-22 DIAGNOSIS — I2510 Atherosclerotic heart disease of native coronary artery without angina pectoris: Secondary | ICD-10-CM | POA: Insufficient documentation

## 2020-04-22 DIAGNOSIS — I5022 Chronic systolic (congestive) heart failure: Secondary | ICD-10-CM | POA: Insufficient documentation

## 2020-04-22 DIAGNOSIS — I428 Other cardiomyopathies: Secondary | ICD-10-CM | POA: Diagnosis not present

## 2020-04-22 DIAGNOSIS — Z8249 Family history of ischemic heart disease and other diseases of the circulatory system: Secondary | ICD-10-CM | POA: Diagnosis not present

## 2020-04-22 DIAGNOSIS — I493 Ventricular premature depolarization: Secondary | ICD-10-CM | POA: Insufficient documentation

## 2020-04-22 MED ORDER — SACUBITRIL-VALSARTAN 49-51 MG PO TABS: 1.0000 | ORAL_TABLET | Freq: Two times a day (BID) | ORAL | 11 refills | Status: DC

## 2020-04-22 NOTE — Patient Instructions (Signed)
It was a pleasure seeing you today!  MEDICATIONS: -We are changing your medications today -Increase Entresto to 49/51 mg (1 tablet) twice daily.  -Call if you have questions about your medications.  NEXT APPOINTMENT: Return to clinic in 3 weeks with Pharmacy Clinic.  In general, to take care of your heart failure: -Limit your fluid intake to 2 Liters (half-gallon) per day.   -Limit your salt intake to ideally 2-3 grams (2000-3000 mg) per day. -Weigh yourself daily and record, and bring that "weight diary" to your next appointment.  (Weight gain of 2-3 pounds in 1 day typically means fluid weight.) -The medications for your heart are to help your heart and help you live longer.   -Please contact us before stopping any of your heart medications.  Call the clinic at [phone] with questions or to reschedule future appointments.

## 2020-04-30 ENCOUNTER — Other Ambulatory Visit (HOSPITAL_COMMUNITY): Payer: Self-pay

## 2020-04-30 NOTE — Progress Notes (Signed)
Paramedicine Encounter    Patient ID: Zachary Arias, male    DOB: July 01, 1961, 59 y.o.   MRN: [id number]   Patient Care Team: Patient, No Pcp Per as PCP - General (General Practice)  Patient Active Problem List   Diagnosis Date Noted  . Cardiogenic shock (HCC) 03/05/2020  . Hypotension 03/05/2020  . Hyperkalemia 03/05/2020  . Elevated WBC count 03/05/2020  . CKD (chronic kidney disease), stage III 03/05/2020  . Chronic bilateral pleural effusions   . Hyperlipidemia LDL goal <70 09/18/2014  . CAD, NATIVE VESSEL 07/22/2009  . SYSTOLIC HEART FAILURE, CHRONIC 07/22/2009  . HYPERTENSION 07/07/2009  . CAD 07/07/2009  . CHF 07/07/2009  . TACHYCARDIA 07/07/2009    Current Outpatient Medications:  .  amiodarone (PACERONE) 200 MG tablet, Take 1 tablet (200 mg total) by mouth daily., Disp: 30 tablet, Rfl: 3 .  aspirin EC 81 MG tablet, Take 1 tablet (81 mg total) by mouth daily., Disp: 90 tablet, Rfl: 3 .  atorvastatin (LIPITOR) 40 MG tablet, TAKE 1 TABLET DAILY (REPLACES SIMVASTATIN), Disp: 30 tablet, Rfl: 3 .  carvedilol (COREG) 3.125 MG tablet, Take 1 tablet (3.125 mg total) by mouth 2 (two) times daily., Disp: 60 tablet, Rfl: 3 .  digoxin (LANOXIN) 0.125 MG tablet, Take 1 tablet (125 mcg total) by mouth daily., Disp: 30 tablet, Rfl: 3 .  Multiple Vitamins-Minerals (CENTRUM ULTRA MENS PO), Take 1 tablet by mouth daily. , Disp: , Rfl:  .  sacubitril-valsartan (ENTRESTO) 49-51 MG, Take 1 tablet by mouth 2 (two) times daily., Disp: 60 tablet, Rfl: 11 .  spironolactone (ALDACTONE) 25 MG tablet, Take 0.5 tablets (12.5 mg total) by mouth daily., Disp: 15 tablet, Rfl: 3 Allergies  Allergen Reactions  . Avocado Nausea Only  . Shellfish Allergy Nausea And Vomiting      Social History   Socioeconomic History  . Marital status: Single    Spouse name: Not on file  . Number of children: Not on file  . Years of education: Not on file  . Highest education level: Not on file  Occupational  History  . Not on file  Tobacco Use  . Smoking status: Never Smoker  . Smokeless tobacco: Never Used  Vaping Use  . Vaping Use: Never assessed  Substance and Sexual Activity  . Alcohol use: Not on file  . Drug use: Not on file  . Sexual activity: Not on file  Other Topics Concern  . Not on file  Social History Narrative  . Not on file   Social Determinants of Health   Financial Resource Strain:   . Difficulty of Paying Living Expenses:   Food Insecurity:   . Worried About Programme researcher, broadcasting/film/video in the Last Year:   . Barista in the Last Year:   Transportation Needs:   . Freight forwarder (Medical):   Marland Kitchen Lack of Transportation (Non-Medical):   Physical Activity:   . Days of Exercise per Week:   . Minutes of Exercise per Session:   Stress:   . Feeling of Stress :   Social Connections:   . Frequency of Communication with Friends and Family:   . Frequency of Social Gatherings with Friends and Family:   . Attends Religious Services:   . Active Member of Clubs or Organizations:   . Attends Banker Meetings:   Marland Kitchen Marital Status:   Intimate Partner Violence:   . Fear of Current or Ex-Partner:   . Emotionally Abused:   .  Physically Abused:   . Sexually Abused:     Physical Exam Cardiovascular:     Rate and Rhythm: Normal rate and regular rhythm.     Pulses: Normal pulses.     Heart sounds: Normal heart sounds.  Pulmonary:     Effort: Pulmonary effort is normal.     Breath sounds: Normal breath sounds.  Musculoskeletal:        General: Normal range of motion.     Right lower leg: No edema.     Left lower leg: No edema.  Skin:    General: Skin is warm and dry.     Capillary Refill: Capillary refill takes less than 2 seconds.  Neurological:     Mental Status: He is alert and oriented to person, place, and time.  Psychiatric:        Mood and Affect: Mood normal.         Future Appointments  Date Time Provider Department Center  05/13/2020   1:00 PM [facility] None    BP 124/82 (BP Location: Left Arm, Patient Position: Sitting, Cuff Size: Normal)   Pulse 80   Resp 16   Wt 158 lb 12.8 oz (72 kg)   SpO2 99%   BMI 24.50 kg/m   Weight yesterday- 153.5 lb Last visit weight- 162.6 lb  [name] was seen at home today and reported feeling well. He denied chest pain, SOB, headache, dizziness, orthopnea, fever or cough over the past week. He stated he has been compliant with his medications over the past week and his weight has been generally stable. He had a bump in his weight today however he forgot to weigh this morning and had already eaten and drank throughout the day when he weighed with me this afternoon. He did not exhibit any signs of fluid retention. His medications were verified and his pillbox was refilled. I will follow up next week.   Jacqualine Code, EMT 04/30/20  ACTION: Home visit completed Next visit planned for 1 week

## 2020-05-05 ENCOUNTER — Telehealth (HOSPITAL_COMMUNITY): Payer: Self-pay | Admitting: Vascular Surgery

## 2020-05-05 NOTE — Telephone Encounter (Signed)
Called pt pt give f/u appt w/ db w/ echo in Oct, pt does not have VM set up, wil try back later

## 2020-05-07 ENCOUNTER — Other Ambulatory Visit (HOSPITAL_COMMUNITY): Payer: Self-pay

## 2020-05-07 NOTE — Progress Notes (Signed)
Paramedicine Encounter    Patient ID: Zachary Arias, male    DOB: July 01, 1961, 59 y.o.   MRN: [id number]   Patient Care Team: Patient, No Pcp Per as PCP - General (General Practice)  Patient Active Problem List   Diagnosis Date Noted  . Cardiogenic shock (HCC) 03/05/2020  . Hypotension 03/05/2020  . Hyperkalemia 03/05/2020  . Elevated WBC count 03/05/2020  . CKD (chronic kidney disease), stage III 03/05/2020  . Chronic bilateral pleural effusions   . Hyperlipidemia LDL goal <70 09/18/2014  . CAD, NATIVE VESSEL 07/22/2009  . SYSTOLIC HEART FAILURE, CHRONIC 07/22/2009  . HYPERTENSION 07/07/2009  . CAD 07/07/2009  . CHF 07/07/2009  . TACHYCARDIA 07/07/2009    Current Outpatient Medications:  .  amiodarone (PACERONE) 200 MG tablet, Take 1 tablet (200 mg total) by mouth daily., Disp: 30 tablet, Rfl: 3 .  aspirin EC 81 MG tablet, Take 1 tablet (81 mg total) by mouth daily., Disp: 90 tablet, Rfl: 3 .  atorvastatin (LIPITOR) 40 MG tablet, TAKE 1 TABLET DAILY (REPLACES SIMVASTATIN), Disp: 30 tablet, Rfl: 3 .  carvedilol (COREG) 3.125 MG tablet, Take 1 tablet (3.125 mg total) by mouth 2 (two) times daily., Disp: 60 tablet, Rfl: 3 .  digoxin (LANOXIN) 0.125 MG tablet, Take 1 tablet (125 mcg total) by mouth daily., Disp: 30 tablet, Rfl: 3 .  Multiple Vitamins-Minerals (CENTRUM ULTRA MENS PO), Take 1 tablet by mouth daily. , Disp: , Rfl:  .  sacubitril-valsartan (ENTRESTO) 49-51 MG, Take 1 tablet by mouth 2 (two) times daily., Disp: 60 tablet, Rfl: 11 .  spironolactone (ALDACTONE) 25 MG tablet, Take 0.5 tablets (12.5 mg total) by mouth daily., Disp: 15 tablet, Rfl: 3 Allergies  Allergen Reactions  . Avocado Nausea Only  . Shellfish Allergy Nausea And Vomiting      Social History   Socioeconomic History  . Marital status: Single    Spouse name: Not on file  . Number of children: Not on file  . Years of education: Not on file  . Highest education level: Not on file  Occupational  History  . Not on file  Tobacco Use  . Smoking status: Never Smoker  . Smokeless tobacco: Never Used  Vaping Use  . Vaping Use: Never assessed  Substance and Sexual Activity  . Alcohol use: Not on file  . Drug use: Not on file  . Sexual activity: Not on file  Other Topics Concern  . Not on file  Social History Narrative  . Not on file   Social Determinants of Health   Financial Resource Strain:   . Difficulty of Paying Living Expenses:   Food Insecurity:   . Worried About Programme researcher, broadcasting/film/video in the Last Year:   . Barista in the Last Year:   Transportation Needs:   . Freight forwarder (Medical):   Marland Kitchen Lack of Transportation (Non-Medical):   Physical Activity:   . Days of Exercise per Week:   . Minutes of Exercise per Session:   Stress:   . Feeling of Stress :   Social Connections:   . Frequency of Communication with Friends and Family:   . Frequency of Social Gatherings with Friends and Family:   . Attends Religious Services:   . Active Member of Clubs or Organizations:   . Attends Banker Meetings:   Marland Kitchen Marital Status:   Intimate Partner Violence:   . Fear of Current or Ex-Partner:   . Emotionally Abused:   .  Physically Abused:   . Sexually Abused:     Physical Exam Cardiovascular:     Rate and Rhythm: Normal rate and regular rhythm.     Pulses: Normal pulses.  Pulmonary:     Effort: Pulmonary effort is normal.  Musculoskeletal:        General: Normal range of motion.     Right lower leg: No edema.     Left lower leg: No edema.  Skin:    General: Skin is warm and dry.     Capillary Refill: Capillary refill takes less than 2 seconds.  Neurological:     Mental Status: He is alert and oriented to person, place, and time.  Psychiatric:        Mood and Affect: Mood normal.         Future Appointments  Date Time Provider Department Center  05/13/2020  1:00 PM [facility] None  07/07/2020  9:00 AM [facility] E[facility] OP 1  [facility]  07/07/2020 10:00 AM Bensimhon, Bevelyn Buckles, MD [facility] None    BP 105/69 (BP Location: Left Arm, Patient Position: Sitting, Cuff Size: Normal)   Pulse 70   Resp 16   Wt 155 lb 6.4 oz (70.5 kg)   SpO2 99%   BMI 23.98 kg/m   Weight yesterday- did not weigh  Last visit weight- 158.8 lb  Zachary Arias was seen at home today and reported feeling well. He denied chest pain, SOB, headache, dizziness, orthopnea, fever or cough over the past week. He stated he has been compliant with his medications over the past week and his weight has been stable. His medications were verified and his pillbox was refilled. He is doing well with maintaining his medication regimen and keeping his weight stable. Given this, I spoke with Zachary Chura about changing his visit to every two weeks. He was agreeable but was understanding that he could and should call me if something come sup before then.   Jacqualine Code, EMT 05/07/20  ACTION: Home visit completed Next visit planned for 2 weeks

## 2020-05-13 ENCOUNTER — Ambulatory Visit (HOSPITAL_COMMUNITY)
Admission: RE | Admit: 2020-05-13 | Discharge: 2020-05-13 | Disposition: A | Discharge status: Home / Self Care | Payer: BC Managed Care – PPO | Source: Ambulatory Visit | Attending: Internal Medicine | Admitting: Internal Medicine

## 2020-05-13 ENCOUNTER — Other Ambulatory Visit: Payer: Self-pay

## 2020-05-13 VITALS — BP 124/92 | HR 81 | Wt 164.8 lb

## 2020-05-13 DIAGNOSIS — I5022 Chronic systolic (congestive) heart failure: Secondary | ICD-10-CM | POA: Insufficient documentation

## 2020-05-13 LAB — BASIC METABOLIC PANEL
Anion gap: 9 (ref 5–15)
BUN: 17 mg/dL (ref 6–20)
CO2: 24 mmol/L (ref 22–32)
Calcium: 9 mg/dL (ref 8.9–10.3)
Chloride: 106 mmol/L (ref 98–111)
Creatinine, Ser: 1.44 mg/dL — ABNORMAL HIGH (ref 0.61–1.24)
GFR calc Af Amer: >60 mL/min (ref >60)
GFR calc non Af Amer: 54 mL/min — ABNORMAL LOW (ref >60)
Glucose, Bld: 95 mg/dL (ref 70–99)
Potassium: 5.6 mmol/L — ABNORMAL HIGH (ref 3.5–5.1)
Sodium: 139 mmol/L (ref 135–145)

## 2020-05-13 MED ORDER — DAPAGLIFLOZIN PROPANEDIOL 10 MG PO TABS
10.0000 mg | ORAL_TABLET | Freq: Every day | ORAL | 11 refills | Status: DC
Start: 2020-05-13 — End: 2021-05-15

## 2020-05-13 NOTE — Patient Instructions (Signed)
It was a pleasure seeing you today!  MEDICATIONS: -We are changing your medications today -Start dapagliflozin 10 mg (1 tablet) daily -Call if you have questions about your medications.  LABS: -We will call you if your labs need attention.  NEXT APPOINTMENT: Return to clinic in 2 months with Dr. Gala Romney.  In general, to take care of your heart failure: -Limit your fluid intake to 2 Liters (half-gallon) per day.   -Limit your salt intake to ideally 2-3 grams (2000-3000 mg) per day. -Weigh yourself daily and record, and bring that "weight diary" to your next appointment.  (Weight gain of 2-3 pounds in 1 day typically means fluid weight.) -The medications for your heart are to help your heart and help you live longer.   -Please contact us before stopping any of your heart medications.  Call the clinic at [phone] with questions or to reschedule future appointments.

## 2020-05-13 NOTE — Progress Notes (Signed)
HF Cardiologist: Dr. Gala Romney  HPI:  Zachary Arias is a 59 year old malegentlemen with history of HF due to mixed ischemic/no-ischemic CM (diagnosed 9/10) with EF 25-30% range. Cath with 1-v CAD with chronically occluded RCA. MRI in January 2011 with EF 31% with inferior scar.   Echo April 2011 showed EF 30-35%. Echo August 2013 showed EF 45-50% with inferior and inferoseptal hypokinesis.  Brother had heart transplant in [place] several months ago. Dad also had previous heart transplant.  He was lost to f/u since 2015. Admitted 03/05/20 with cardiogenicshockafter stopping meds for months and using supplements.SBP in 70-80s.ECHO EF 20-25% RV normal. Severe MR. Having frequent PVCs.Placed on milrinone to suppress PVCs.  Cath 03/10/20.R/LHC showed stable CAD (chronically occluded distal RCA and moderate non-obstructive CAD elsewhere), low filling pressures and normal cardiac output on milrinone 0.25.  Presented to HF Clinic with Dr. Gala Romney on 03/26/20. Came with paramedicine ([name]). Feeling much better. Able to get around without difficulty. No CP or SOB. Had one episode of dizziness when he stood up too quickly. Taking all medicines without difficulty.  Returned to HF clinic for pharmacist medication titration on 04/22/20. At last visit with MD, carvedilol 3.125 mg BID was initiated. Spironolactone was decreased to 12.5 mg daily after BMET showed potassium level of 5.1. Repeat labs 1 week later showed improved potassium of 3.9. Overall he felt well at pharmacy visit. He said that his pharmacy accidentally filled the wrong dose of carvedilol (25 mg BID instead of 3.125 mg BID) and he felt very dizzy after that change. However, he realized the mistake and was doing well since changing to the correct dose of 3.125 mg BID. No chest pain or palpitations. Could walk 10 minutes before getting SOB. He was nervous about going back to work and was trying to get the date extended as his job is very physically  demanding. He was not taking any diuretic. His weight had been stable at home at 153 lbs. No LEE, PND or orthopnea. Was taking all medications as prescribed and tolerating all medications.  Today he returns to HF clinic for pharmacist medication titration. At last visit with pharmacy, [name] was increased to 49/51 mg BID. He reports an improvement in overall well-being. Denies fatigue. Endorsed a dizzy spell a few nights ago which didn't subside with rest, but improved after a small snack. Doesn't normally eat when taking medications. Denies chest pain, palpitations, PND, orthopnea. No LEE on exam and home weights stable. Patient self-monitors fluid level carefully. Denied SOB when walking, but had to break from cardio exercise (shadow boxing) due to shortness of breath. Tolerating all current medications and denies problems obtaining medications.  HF Medications: Carvedilol 3.125 mg BID Entresto 49/51 mg BID Spironolactone 12.5 mg daily Digoxin 0.125 mg daily  Has the patient been experiencing any side effects to the medications prescribed?  no  Does the patient have any problems obtaining medications due to transportation or finances?   No - has Nurse, learning disability. Has copay card for [facility].   Understanding of regimen: good Understanding of indications: good Potential of compliance: good Patient understands to avoid NSAIDs. Patient understands to avoid decongestants.  Pertinent Lab Values: . Serum creatinine 1.44, BUN 17, Potassium 5.6 (hemolyzed), Sodium 139, BNP 338.8 pg/mL (03/26/20), Digoxin 0.4 ng/mL (03/11/20)  Vital Signs: . Weight: 164 lbs (last clinic weight: 162.6 lbs) . Blood pressure: 124/92 mmHg . Heart rate: 81 bpm   Assessment: 1. Chronic systolic CHF: -Cath with 1-v CAD with chronically occluded RCA. MRI in  January 2011 with EF 31% with inferior scar. -Suspect mixed ischemic/nonischemic cardiomyopathy with familial component EF 45-50% on echo in 8/13.  -  No f/u between to 2015-2021 - Echo 6/21 EF 20-25% severe central MR. Repeat cath 6/21 with stable CAD - NYHA II, volume status oktoday - Labs: BMET stable at 1.44. Potassium elevated at 5.6 but result was hemolyzed. Result returned after patient visit complete. Voicemail was left for patient instructing him to repeat BMET this week.  - Continue carvedilol 3.125 mg BID - Continue Entresto 49/51 mg BID.  - Continue spironolactone 12.5 mg daily.  - Start dapagliflozin 10 mg daily - Continue digoxin 0.125 mg daily - Repeat echo scheduled for 07/07/20 to determine need for ICD. Scheduled for 07/07/20. Per Dr. Gala Romney, patient should not return to work until results from repeat echo can be reviewed.   2. CAD: -Occluded RCAon cath -Nochest pain - Continue asa 81 mg daily.  - Continue atorvastatin 40mg  daily  3.PVCs - Continue amiodarone for now. Per Dr. , will plan to stop in a few months.   Plan: 1) Medication changes: Based on clinical presentation, vital signs and recent labs will start dapagliflozin 10 mg daily. 2) Labs: repeat BMET this week as potassium was hemolyzed on BMET drawn today.  3) Follow-up: ECHO and follow-up with Dr. Gala Romney 07/07/20.   09/06/20, PharmD, BCPS, BCCP, CPP Heart Failure Clinic Pharmacist [phone]

## 2020-05-14 ENCOUNTER — Telehealth (HOSPITAL_COMMUNITY): Payer: Self-pay | Admitting: *Deleted

## 2020-05-14 ENCOUNTER — Telehealth (HOSPITAL_COMMUNITY): Payer: Self-pay

## 2020-05-14 ENCOUNTER — Encounter (HOSPITAL_COMMUNITY): Payer: Self-pay | Admitting: *Deleted

## 2020-05-14 NOTE — Telephone Encounter (Signed)
I called Mr Brightwell to check on him follow his appointment at the HF clinic yesterday. He was ordered to start [name] but his pharmacy did not have it in stock yesterday. They advised him it would be ready today or tomorrow and he stated he would pick it up when he hears it is ready. Since he was seen yesterday in the clinic and not started his new medication yet, I advised I would follow up with his next week but asked that he call me if he has not gotten the medication by tomorrow. He was understanding and agreeable.   Jacqualine Code, EMT 05/14/20

## 2020-05-14 NOTE — Telephone Encounter (Signed)
Letter for pt to remain out of work until next appt on 10/11 faxed along w/8/17 OV note to [employer]

## 2020-05-14 NOTE — Telephone Encounter (Signed)
Received a fax requesting medical records from [employer] for STD. Records were successfully faxed to: [phone] ,which was the number provided.. Medical request form will be scanned into patients chart.

## 2020-05-15 ENCOUNTER — Ambulatory Visit (HOSPITAL_COMMUNITY)
Admission: RE | Admit: 2020-05-15 | Discharge: 2020-05-15 | Disposition: A | Discharge status: Home / Self Care | Payer: BC Managed Care – PPO | Source: Ambulatory Visit | Attending: Internal Medicine | Admitting: Internal Medicine

## 2020-05-15 ENCOUNTER — Other Ambulatory Visit: Payer: Self-pay

## 2020-05-15 ENCOUNTER — Other Ambulatory Visit (HOSPITAL_COMMUNITY): Payer: Self-pay | Admitting: *Deleted

## 2020-05-15 DIAGNOSIS — I5022 Chronic systolic (congestive) heart failure: Secondary | ICD-10-CM | POA: Diagnosis present

## 2020-05-15 LAB — BASIC METABOLIC PANEL
Anion gap: 11 (ref 5–15)
BUN: 17 mg/dL (ref 6–20)
CO2: 28 mmol/L (ref 22–32)
Calcium: 9.4 mg/dL (ref 8.9–10.3)
Chloride: 103 mmol/L (ref 98–111)
Creatinine, Ser: 1.38 mg/dL — ABNORMAL HIGH (ref 0.61–1.24)
GFR calc Af Amer: >60 mL/min (ref >60)
GFR calc non Af Amer: 56 mL/min — ABNORMAL LOW (ref >60)
Glucose, Bld: 106 mg/dL — ABNORMAL HIGH (ref 70–99)
Potassium: 4.4 mmol/L (ref 3.5–5.1)
Sodium: 142 mmol/L (ref 135–145)

## 2020-05-21 ENCOUNTER — Other Ambulatory Visit (HOSPITAL_COMMUNITY): Payer: Self-pay

## 2020-05-21 NOTE — Progress Notes (Signed)
Paramedicine Encounter    Patient ID: Zachary Arias, male    DOB: [birth year], 59 y.o.   MRN: [id number]   Patient Care Team: Patient, No Pcp Per as PCP - General (General Practice)  Patient Active Problem List   Diagnosis Date Noted  . Cardiogenic shock (HCC) 03/05/2020  . Hypotension 03/05/2020  . Hyperkalemia 03/05/2020  . Elevated WBC count 03/05/2020  . CKD (chronic kidney disease), stage III 03/05/2020  . Chronic bilateral pleural effusions   . Hyperlipidemia LDL goal <70 09/18/2014  . CAD, NATIVE VESSEL 07/22/2009  . SYSTOLIC HEART FAILURE, CHRONIC 07/22/2009  . HYPERTENSION 07/07/2009  . CAD 07/07/2009  . CHF 07/07/2009  . TACHYCARDIA 07/07/2009    Current Outpatient Medications:  .  amiodarone (PACERONE) 200 MG tablet, Take 1 tablet (200 mg total) by mouth daily., Disp: 30 tablet, Rfl: 3 .  aspirin EC 81 MG tablet, Take 1 tablet (81 mg total) by mouth daily., Disp: 90 tablet, Rfl: 3 .  atorvastatin (LIPITOR) 40 MG tablet, TAKE 1 TABLET DAILY (REPLACES SIMVASTATIN), Disp: 30 tablet, Rfl: 3 .  carvedilol (COREG) 3.125 MG tablet, Take 1 tablet (3.125 mg total) by mouth 2 (two) times daily., Disp: 60 tablet, Rfl: 3 .  dapagliflozin propanediol (FARXIGA) 10 MG TABS tablet, Take 1 tablet (10 mg total) by mouth daily before breakfast., Disp: 30 tablet, Rfl: 11 .  digoxin (LANOXIN) 0.125 MG tablet, Take 1 tablet (125 mcg total) by mouth daily., Disp: 30 tablet, Rfl: 3 .  Multiple Vitamins-Minerals (CENTRUM ULTRA MENS PO), Take 1 tablet by mouth daily. , Disp: , Rfl:  .  sacubitril-valsartan (ENTRESTO) 49-51 MG, Take 1 tablet by mouth 2 (two) times daily., Disp: 60 tablet, Rfl: 11 .  spironolactone (ALDACTONE) 25 MG tablet, Take 0.5 tablets (12.5 mg total) by mouth daily., Disp: 15 tablet, Rfl: 3 Allergies  Allergen Reactions  . Avocado Nausea Only  . Shellfish Allergy Nausea And Vomiting      Social History   Socioeconomic History  . Marital status: Single    Spouse  name: Not on file  . Number of children: Not on file  . Years of education: Not on file  . Highest education level: Not on file  Occupational History  . Not on file  Tobacco Use  . Smoking status: Never Smoker  . Smokeless tobacco: Never Used  Vaping Use  . Vaping Use: Never assessed  Substance and Sexual Activity  . Alcohol use: Not on file  . Drug use: Not on file  . Sexual activity: Not on file  Other Topics Concern  . Not on file  Social History Narrative  . Not on file   Social Determinants of Health   Financial Resource Strain:   . Difficulty of Paying Living Expenses: Not on file  Food Insecurity:   . Worried About Programme researcher, broadcasting/film/video in the Last Year: Not on file  . Ran Out of Food in the Last Year: Not on file  Transportation Needs:   . Lack of Transportation (Medical): Not on file  . Lack of Transportation (Non-Medical): Not on file  Physical Activity:   . Days of Exercise per Week: Not on file  . Minutes of Exercise per Session: Not on file  Stress:   . Feeling of Stress : Not on file  Social Connections:   . Frequency of Communication with Friends and Family: Not on file  . Frequency of Social Gatherings with Friends and Family: Not on file  . Attends  Religious Services: Not on file  . Active Member of Clubs or Organizations: Not on file  . Attends Banker Meetings: Not on file  . Marital Status: Not on file  Intimate Partner Violence:   . Fear of Current or Ex-Partner: Not on file  . Emotionally Abused: Not on file  . Physically Abused: Not on file  . Sexually Abused: Not on file    Physical Exam Cardiovascular:     Rate and Rhythm: Normal rate and regular rhythm.     Pulses: Normal pulses.  Pulmonary:     Effort: Pulmonary effort is normal.     Breath sounds: Normal breath sounds.  Musculoskeletal:        General: Normal range of motion.     Right lower leg: No edema.     Left lower leg: No edema.  Skin:    General: Skin is warm  and dry.     Capillary Refill: Capillary refill takes less than 2 seconds.  Neurological:     Mental Status: He is alert and oriented to person, place, and time.  Psychiatric:        Mood and Affect: Mood normal.         Future Appointments  Date Time Provider Department Center  07/07/2020  9:00 AM MC ECHO OP 1 [facility]  07/07/2020 10:00 AM Bensimhon, Bevelyn Buckles, MD [facility] None    BP 114/78 (BP Location: Left Arm, Patient Position: Sitting, Cuff Size: Normal)   Pulse 71   Resp 16   Wt 151 lb 12.8 oz (68.9 kg)   SpO2 97%   BMI 23.42 kg/m   Weight yesterday- 155 lb  Last visit weight- 155.8 lb  [name] was seen at home today and reported feeling well. He denied chest pain, SOB, headache, dizziness, orthopnea, fever or cough over the past week. He stated he has been compliant with his medications over the past week and his weight has been stable. His medications were verified and his pillbox was refilled. I will follow up in two weeks.   Jacqualine Code, EMT 05/21/20  ACTION: Home visit completed Next visit planned for 2 weeks

## 2020-06-04 ENCOUNTER — Other Ambulatory Visit (HOSPITAL_COMMUNITY): Payer: Self-pay

## 2020-06-04 ENCOUNTER — Encounter (HOSPITAL_COMMUNITY): Payer: Self-pay

## 2020-06-05 NOTE — Progress Notes (Signed)
Paramedicine Encounter    Patient ID: Zachary Arias, male    DOB: [birth year], 59 y.o.   MRN: [id number]   Patient Care Team: Patient, Zachary Arias as PCP - General (General Practice)  Patient Active Problem List   Diagnosis Date Noted  . Cardiogenic shock (HCC) 03/05/2020  . Hypotension 03/05/2020  . Hyperkalemia 03/05/2020  . Elevated WBC count 03/05/2020  . CKD (chronic kidney disease), stage III 03/05/2020  . Chronic bilateral pleural effusions   . Hyperlipidemia LDL goal <70 09/18/2014  . CAD, NATIVE VESSEL 07/22/2009  . SYSTOLIC HEART FAILURE, CHRONIC 07/22/2009  . HYPERTENSION 07/07/2009  . CAD 07/07/2009  . CHF 07/07/2009  . TACHYCARDIA 07/07/2009    Current Outpatient Medications:  .  amiodarone (PACERONE) 200 MG tablet, Take 1 tablet (200 mg total) by mouth daily., Disp: 30 tablet, Rfl: 3 .  aspirin EC 81 MG tablet, Take 1 tablet (81 mg total) by mouth daily., Disp: 90 tablet, Rfl: 3 .  atorvastatin (LIPITOR) 40 MG tablet, TAKE 1 TABLET DAILY (REPLACES SIMVASTATIN), Disp: 30 tablet, Rfl: 3 .  carvedilol (COREG) 3.125 MG tablet, Take 1 tablet (3.125 mg total) by mouth 2 (two) times daily., Disp: 60 tablet, Rfl: 3 .  dapagliflozin propanediol (FARXIGA) 10 MG TABS tablet, Take 1 tablet (10 mg total) by mouth daily before breakfast., Disp: 30 tablet, Rfl: 11 .  digoxin (LANOXIN) 0.125 MG tablet, Take 1 tablet (125 mcg total) by mouth daily., Disp: 30 tablet, Rfl: 3 .  Multiple Vitamins-Minerals (CENTRUM ULTRA MENS PO), Take 1 tablet by mouth daily. , Disp: , Rfl:  .  sacubitril-valsartan (ENTRESTO) 49-51 MG, Take 1 tablet by mouth 2 (two) times daily., Disp: 60 tablet, Rfl: 11 .  spironolactone (ALDACTONE) 25 MG tablet, Take 0.5 tablets (12.5 mg total) by mouth daily., Disp: 15 tablet, Rfl: 3 Allergies  Allergen Reactions  . Avocado Nausea Only  . Shellfish Allergy Nausea And Vomiting      Social History   Socioeconomic History  . Marital status: Single    Spouse  name: Not on file  . Number of children: Not on file  . Years of education: Not on file  . Highest education level: Not on file  Occupational History  . Not on file  Tobacco Use  . Smoking status: Never Smoker  . Smokeless tobacco: Never Used  Vaping Use  . Vaping Use: Never assessed  Substance and Sexual Activity  . Alcohol use: Not on file  . Drug use: Not on file  . Sexual activity: Not on file  Other Topics Concern  . Not on file  Social History Narrative  . Not on file   Social Determinants of Health   Financial Resource Strain:   . Difficulty of Paying Living Expenses: Not on file  Food Insecurity:   . Worried About Programme researcher, broadcasting/film/video in the Last Year: Not on file  . Ran Out of Food in the Last Year: Not on file  Transportation Needs:   . Lack of Transportation (Medical): Not on file  . Lack of Transportation (Non-Medical): Not on file  Physical Activity:   . Days of Exercise Arias Week: Not on file  . Minutes of Exercise Arias Session: Not on file  Stress:   . Feeling of Stress : Not on file  Social Connections:   . Frequency of Communication with Friends and Family: Not on file  . Frequency of Social Gatherings with Friends and Family: Not on file  . Attends  Religious Services: Not on file  . Active Member of Clubs or Organizations: Not on file  . Attends Banker Meetings: Not on file  . Marital Status: Not on file  Intimate Partner Violence:   . Fear of Current or Ex-Partner: Not on file  . Emotionally Abused: Not on file  . Physically Abused: Not on file  . Sexually Abused: Not on file    Physical Exam Cardiovascular:     Rate and Rhythm: Normal rate and regular rhythm.     Pulses: Normal pulses.  Pulmonary:     Effort: Pulmonary effort is normal.     Breath sounds: Normal breath sounds.  Musculoskeletal:        General: Normal range of motion.     Right lower leg: Zachary edema.     Left lower leg: Zachary edema.  Skin:    General: Skin is warm  and dry.     Capillary Refill: Capillary refill takes less than 2 seconds.  Neurological:     Mental Status: He is alert and oriented to person, place, and time.  Psychiatric:        Mood and Affect: Mood normal.         Future Appointments  Date Time Provider Department Center  07/07/2020  9:00 AM MC ECHO OP 1 [facility]  07/07/2020 10:00 AM Bensimhon, Bevelyn Buckles, MD [facility] None    BP 120/74 (BP Location: Left Arm, Patient Position: Sitting, Cuff Size: Normal)   Pulse 66   Resp 16   Wt 156 lb 12.8 oz (71.1 kg)   SpO2 98%   BMI 24.20 kg/m   Weight yesterday- 155 lb Last visit weight- 151.8 lb  Mr Zachary Arias was seen at home today and reported feeling well. He denied chest pain, SOB, headache, dizziness, orthopnea, fever or cough over the past week. He reported being compliant with his medications and his weight has been stable. He exhibits a clear understanding of his medications and has been able to rill his own pillbox without difficulty. At this time I feel Mr Zachary Arias is ready for discharge from paramedicine. We discussed this and he was agreeable. He understands the weight gain parameters and knows when he should reach out to the HF clinic for assistance. I will relay this information to the clinic staff but from my end Mr Zachary Arias has graduated at this time.   Jacqualine Code, EMT 06/05/20  ACTION: Home visit completed

## 2020-07-07 ENCOUNTER — Ambulatory Visit (HOSPITAL_BASED_OUTPATIENT_CLINIC_OR_DEPARTMENT_OTHER)
Admission: RE | Admit: 2020-07-07 | Discharge: 2020-07-07 | Disposition: A | Discharge status: Home / Self Care | Payer: BC Managed Care – PPO | Source: Ambulatory Visit | Attending: Internal Medicine | Admitting: Internal Medicine

## 2020-07-07 ENCOUNTER — Ambulatory Visit (HOSPITAL_COMMUNITY)
Admission: RE | Admit: 2020-07-07 | Discharge: 2020-07-07 | Disposition: A | Discharge status: Home / Self Care | Payer: BC Managed Care – PPO | Source: Ambulatory Visit | Attending: Internal Medicine | Admitting: Internal Medicine

## 2020-07-07 ENCOUNTER — Other Ambulatory Visit: Payer: Self-pay

## 2020-07-07 VITALS — BP 152/90 | HR 65 | Wt 165.4 lb

## 2020-07-07 DIAGNOSIS — I493 Ventricular premature depolarization: Secondary | ICD-10-CM | POA: Insufficient documentation

## 2020-07-07 DIAGNOSIS — I2510 Atherosclerotic heart disease of native coronary artery without angina pectoris: Secondary | ICD-10-CM

## 2020-07-07 DIAGNOSIS — Z7984 Long term (current) use of oral hypoglycemic drugs: Secondary | ICD-10-CM | POA: Diagnosis not present

## 2020-07-07 DIAGNOSIS — I130 Hypertensive heart and chronic kidney disease with heart failure and stage 1 through stage 4 chronic kidney disease, or unspecified chronic kidney disease: Secondary | ICD-10-CM | POA: Diagnosis not present

## 2020-07-07 DIAGNOSIS — E785 Hyperlipidemia, unspecified: Secondary | ICD-10-CM | POA: Insufficient documentation

## 2020-07-07 DIAGNOSIS — Z79899 Other long term (current) drug therapy: Secondary | ICD-10-CM | POA: Insufficient documentation

## 2020-07-07 DIAGNOSIS — Z7982 Long term (current) use of aspirin: Secondary | ICD-10-CM | POA: Insufficient documentation

## 2020-07-07 DIAGNOSIS — N189 Chronic kidney disease, unspecified: Secondary | ICD-10-CM | POA: Insufficient documentation

## 2020-07-07 DIAGNOSIS — Z8249 Family history of ischemic heart disease and other diseases of the circulatory system: Secondary | ICD-10-CM | POA: Diagnosis not present

## 2020-07-07 DIAGNOSIS — I5022 Chronic systolic (congestive) heart failure: Secondary | ICD-10-CM

## 2020-07-07 DIAGNOSIS — I255 Ischemic cardiomyopathy: Secondary | ICD-10-CM | POA: Insufficient documentation

## 2020-07-07 DIAGNOSIS — I340 Nonrheumatic mitral (valve) insufficiency: Secondary | ICD-10-CM | POA: Diagnosis not present

## 2020-07-07 LAB — ECHOCARDIOGRAM COMPLETE
AR max vel: 2.69 cm2
AV Area VTI: 2.64 cm2
AV Area mean vel: 2.37 cm2
AV Mean grad: 2 mmHg
AV Peak grad: 3.9 mmHg
Ao pk vel: 0.99 m/s
Area-P 1/2: 3.85 cm2
Calc EF: 32.4 %
MV M vel: 5.23 m/s
MV Peak grad: 109.4 mmHg
Radius: 0.5 cm
S' Lateral: 5.5 cm
Single Plane A2C EF: 37.1 %
Single Plane A4C EF: 28.8 %

## 2020-07-07 LAB — BASIC METABOLIC PANEL
Anion gap: 9 (ref 5–15)
BUN: 19 mg/dL (ref 6–20)
CO2: 24 mmol/L (ref 22–32)
Calcium: 9.3 mg/dL (ref 8.9–10.3)
Chloride: 107 mmol/L (ref 98–111)
Creatinine, Ser: 1.22 mg/dL (ref 0.61–1.24)
GFR, Estimated: >60 mL/min (ref >60)
Glucose, Bld: 95 mg/dL (ref 70–99)
Potassium: 4.2 mmol/L (ref 3.5–5.1)
Sodium: 140 mmol/L (ref 135–145)

## 2020-07-07 LAB — BRAIN NATRIURETIC PEPTIDE: B Natriuretic Peptide: 179.9 pg/mL — ABNORMAL HIGH (ref 0.0–100.0)

## 2020-07-07 MED ORDER — CARVEDILOL 6.25 MG PO TABS
6.2500 mg | ORAL_TABLET | Freq: Two times a day (BID) | ORAL | 3 refills | Status: DC
Start: 2020-07-07 — End: 2020-09-08

## 2020-07-07 MED ORDER — ENTRESTO 97-103 MG PO TABS: 1.0000 | ORAL_TABLET | Freq: Two times a day (BID) | ORAL | 3 refills | Status: DC

## 2020-07-07 NOTE — Progress Notes (Signed)
  Echocardiogram 2D Echocardiogram has been performed.  Gerda Diss 07/07/2020, 9:46 AM

## 2020-07-07 NOTE — Addendum Note (Signed)
Encounter addended by: Samara Snide, RN on: 07/07/2020 10:53 AM  Actions taken: Medication long-term status modified, Order list changed, Diagnosis association updated, Charge Capture section accepted, Clinical Note Signed

## 2020-07-07 NOTE — Patient Instructions (Signed)
STOP Digoxin  INCREASE Entresto 97/103 Twice Daily  INCREASE Carvedilol 6.25mg  Twice Daily  Labs done today, your results will be available in MyChart, we will contact you for abnormal readings.  Your physician recommends that you return for a follow up visit in 2 months with our APP clinic  We will fill out your disability papers to reflect that you will remain out of work for 2 months and re-evaluate at 2 month follow up   If you have any questions or concerns before your next appointment please send [place] a message through [place] or call our office at [phone].    TO LEAVE A MESSAGE FOR THE NURSE SELECT OPTION 2, PLEASE LEAVE A MESSAGE INCLUDING:  YOUR NAME  DATE OF BIRTH  CALL BACK NUMBER  REASON FOR CALL**this is important as we prioritize the call backs  YOU WILL RECEIVE A CALL BACK THE SAME DAY AS LONG AS YOU CALL BEFORE 4:00 PM

## 2020-07-07 NOTE — Progress Notes (Signed)
ADVANCED HF CLINIC NOTE   Patient ID: Zachary Arias, male   DOB: 1960-11-10, 59 y.o.   MRN: [id number] PCP: [name]  HPI: Zachary Arias is a 59 year old male  year gentlemen with history of HF due to mixed ischemic/no-ischemic CM (diagnosed 9/10) with EF 25-30% range. Cath with 1-v CAD with chronically occluded RCA. MRI in January 2011 with EF 31% with inferior scar.   Echo April 2011 showed EF 30-35%. Echo August 2013 showed EF 45-50% with inferior and inferoseptal hypokinesis  Brother had heart transplant in [place] several months ago. Dad also had previous heart transplant.   He was lost to f/u since 2015. Admitted 03/05/20 with cardiogenic shock after stopping meds for months and using supplements. SBP in 70-80s. ECHO EF 20-25% RV normal. Severe MR. Having frequent PVCs. Placed on milrinone to suppress PVCs  Cath 03/10/20. R/LHC showed stable CAD (chronically occluded distal RCA and moderate non-obstructive CAD elsewhere), low filling pressures and normal cardiac output on milrinone 0.25.   Here for routine f/u. Says he feels better. Doing light exercises like push ups, shadow boxing. Denies SOB. Mild chest pressure if he gets stressed. Says he is taking meds.   Echo today 07/07/20 EF 30-35%  SH: Lives in [place], nonsmoker, works in a warehouse.  FH: No premature CAD  ROS: All systems negative except as listed in HPI, PMH and Problem List.  Past Medical History:  Diagnosis Date  . Congestive heart failure (CHF) (HCC)    secondary to ischemic cardiomyopathy with an ejection fraction of 25-30%  . Coronary artery disease   . History of hypertension   . Inguinal hernia 2005   History of left inguinal hernia, status post repair in 2005  . Tachycardia 2010     Unexplained tachycardia during hospitalization    Current Outpatient Medications  Medication Sig Dispense Refill  . amiodarone (PACERONE) 200 MG tablet Take 1 tablet (200 mg total) by mouth daily. 30 tablet 3  . aspirin EC 81  MG tablet Take 1 tablet (81 mg total) by mouth daily. 90 tablet 3  . atorvastatin (LIPITOR) 40 MG tablet TAKE 1 TABLET DAILY (REPLACES SIMVASTATIN) 30 tablet 3  . carvedilol (COREG) 3.125 MG tablet Take 1 tablet (3.125 mg total) by mouth 2 (two) times daily. 60 tablet 3  . dapagliflozin propanediol (FARXIGA) 10 MG TABS tablet Take 1 tablet (10 mg total) by mouth daily before breakfast. 30 tablet 11  . digoxin (LANOXIN) 0.125 MG tablet Take 1 tablet (125 mcg total) by mouth daily. 30 tablet 3  . Multiple Vitamins-Minerals (CENTRUM ULTRA MENS PO) Take 1 tablet by mouth daily.     . sacubitril-valsartan (ENTRESTO) 49-51 MG Take 1 tablet by mouth 2 (two) times daily. 60 tablet 11  . spironolactone (ALDACTONE) 25 MG tablet Take 0.5 tablets (12.5 mg total) by mouth daily. 15 tablet 3   No current facility-administered medications for this encounter.     PHYSICAL EXAM: Vitals:   07/07/20 1007  BP: (!) 152/90  Pulse: 65  SpO2: 99%  Weight: 75 kg (165 lb 6 oz)   General:  Well appearing. No resp difficulty HEENT: normal Neck: supple. no JVD. Carotids 2+ bilat; no bruits. No lymphadenopathy or thryomegaly appreciated. Cor: PMI nondisplaced. Regular rate & rhythm. No rubs, gallops or murmurs. Lungs: clear Abdomen: soft, nontender, nondistended. No hepatosplenomegaly. No bruits or masses. Good bowel sounds. Extremities: no cyanosis, clubbing, rash, edema Neuro: alert & orientedx3, cranial nerves grossly intact. moves all 4 extremities  w/o difficulty. Affect pleasant   ASSESSMENT & PLAN:  1. Chronic systolic CHF:  - Cath with 1-v CAD with chronically occluded RCA. MRI in January 2011 with EF 31% with inferior scar.  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  - No f/u between to 2015-2021 - Echo 6/21 EF 20-25% severe central MR. Repeat cath 6/21 with stable CAD - Much improved NYHA I-II - Echo today 07/07/20 EF 30-35% - Volume  status ok  - Increase Entresto 97/103 bid - Stop digoxin - Continue spiro 12.5  - Increase carvedilol to 6.25 bid  - Continue Farxiga 10  - He is improving with restarting HF meds. EF coming back up but still < 35%. Will continue to titrate meds aggressively as above. Given physical nature of his job I have recommended staying out of work for another 2 months until we reassess. I have suggested getting a less strenuous job.   2. CAD:  - Occluded RCA on cath - No significant s/s ischemia  - Continue asa 81 mg daily.  - Continue atorvastatin 40   3. PVCs - continue amio for now. Will stop in a few months  Arvilla Meres MD 07/07/2020 10:36 AM

## 2020-07-08 NOTE — Addendum Note (Signed)
Encounter addended by: Samara Snide, RN on: 07/08/2020 12:04 PM  Actions taken: Charge Capture section accepted

## 2020-07-11 ENCOUNTER — Encounter (HOSPITAL_COMMUNITY): Payer: Self-pay

## 2020-07-14 ENCOUNTER — Telehealth (HOSPITAL_COMMUNITY): Payer: Self-pay | Admitting: *Deleted

## 2020-07-14 NOTE — Telephone Encounter (Signed)
Geiger, Malick  Bensimhon, Bevelyn Buckles, MD 3 days ago  SP Hi, the insurance company that handles my paycheck while  I'm on  disability has mentioned to me today that they have not yet received the doctor's info. on my behalf.  If have not already done so, please fax the  information to [employer], fax: [phone]  Attn. [employer] as soon as possible, my financial status depends on it. Thanks.

## 2020-07-16 ENCOUNTER — Encounter (HOSPITAL_COMMUNITY): Payer: Self-pay

## 2020-07-16 NOTE — Telephone Encounter (Signed)
Pt's extension forms completed and faxed to [employer] at [phone], mychart mess sent to pt to let him know this was done

## 2020-07-22 ENCOUNTER — Telehealth (HOSPITAL_COMMUNITY): Payer: Self-pay

## 2020-07-22 ENCOUNTER — Encounter (HOSPITAL_COMMUNITY): Payer: Self-pay

## 2020-07-22 NOTE — Telephone Encounter (Signed)
Disability form sent to [employer]. Pt to stay out of work until at least 09/08/20 (follow up appointment) per [name] last note.  Fax confirmation received

## 2020-07-25 ENCOUNTER — Encounter (HOSPITAL_COMMUNITY): Payer: Self-pay

## 2020-08-07 ENCOUNTER — Encounter (HOSPITAL_COMMUNITY): Payer: Self-pay

## 2020-08-08 ENCOUNTER — Telehealth (HOSPITAL_COMMUNITY): Payer: Self-pay | Admitting: *Deleted

## 2020-08-08 ENCOUNTER — Encounter (HOSPITAL_COMMUNITY): Payer: Self-pay | Admitting: *Deleted

## 2020-08-08 NOTE — Telephone Encounter (Signed)
Records faxed to [facility] financial as requested.    Fax [phone]

## 2020-08-11 ENCOUNTER — Encounter (HOSPITAL_COMMUNITY): Payer: Self-pay

## 2020-09-07 NOTE — Progress Notes (Signed)
ADVANCED HF CLINIC NOTE   Patient ID: Zachary Arias, male   DOB: [birth year], 59 y.o.   MRN: [id number] PCP: Dr. Riley Arias HF MD: Dr Zachary Arias   HPI: Zachary Arias is a 59 year old male  year gentlemen with history of HF due to mixed ischemic/no-ischemic CM (diagnosed 9/10) with EF 25-30% range. Cath with 1-v CAD with chronically occluded RCA. MRI in January 2011 with EF 31% with inferior scar.   Brother had heart transplant in [place] several months ago. Dad also had previous heart transplant.   He was lost to f/u since 2015. Admitted 03/05/20 with cardiogenic shock after stopping meds for months and using supplements. SBP in 70-80s. ECHO EF 20-25% RV normal. Severe MR. Having frequent PVCs. Placed on milrinone to suppress PVCs  Cath 03/10/20. R/LHC showed stable CAD (chronically occluded distal RCA and moderate non-obstructive CAD elsewhere), low filling pressures and normal cardiac output on milrinone 0.25.   Today he returns for HF follow up.Overall feeling fine. Says he has tightness in his chest with stress. Not walking very much.  Denies SOB/PND/Orthopnea. Appetite ok. No fever or chills. Weight at home  pounds. Taking all medications. He has been disabled since May. He needs assistance with transportation. Lives alone. He uses [employer].    Echo April 2011 showed EF 30-35%. Echo August 2013 showed EF 45-50% with inferior and inferoseptal hypokinesis Echo  07/07/20 EF 30-35%  SH: Lives in [place], nonsmoker  FH: No premature CAD  ROS: All systems negative except as listed in HPI, PMH and Problem List.  Past Medical History:  Diagnosis Date  . Congestive heart failure (CHF) (HCC)    secondary to ischemic cardiomyopathy with an ejection fraction of 25-30%  . Coronary artery disease   . History of hypertension   . Inguinal hernia 2005   History of left inguinal hernia, status post repair in 2005  . Tachycardia 2010     Unexplained tachycardia during hospitalization     Current Outpatient Medications  Medication Sig Dispense Refill  . amiodarone (PACERONE) 200 MG tablet Take 1 tablet (200 mg total) by mouth daily. 30 tablet 3  . aspirin EC 81 MG tablet Take 1 tablet (81 mg total) by mouth daily. 90 tablet 3  . atorvastatin (LIPITOR) 40 MG tablet TAKE 1 TABLET DAILY (REPLACES SIMVASTATIN) 30 tablet 3  . carvedilol (COREG) 6.25 MG tablet Take 1 tablet (6.25 mg total) by mouth 2 (two) times daily. 60 tablet 3  . dapagliflozin propanediol (FARXIGA) 10 MG TABS tablet Take 1 tablet (10 mg total) by mouth daily before breakfast. 30 tablet 11  . Multiple Vitamins-Minerals (CENTRUM ULTRA MENS PO) Take 1 tablet by mouth daily.     . sacubitril-valsartan (ENTRESTO) 97-103 MG Take 1 tablet by mouth 2 (two) times daily. 60 tablet 3  . spironolactone (ALDACTONE) 25 MG tablet Take 0.5 tablets (12.5 mg total) by mouth daily. 15 tablet 3   No current facility-administered medications for this encounter.     PHYSICAL EXAM: Vitals:   09/08/20 1038  BP: 139/86  Pulse: 68  SpO2: 99%  Weight: 76.3 kg    Wt Readings from Last 3 Encounters:  09/08/20 76.3 kg  07/07/20 75 kg  06/04/20 71.1 kg    ReDS Vest / Clip - 09/08/20 1100      ReDS Vest / Clip   Station Marker C    Ruler Value 31.5    ReDS Value Range Low volume    ReDS Actual Value 33  Anatomical Comments sitting           General:  Well appearing. No resp difficulty HEENT: normal Neck: supple. no JVD. Carotids 2+ bilat; no bruits. No lymphadenopathy or thryomegaly appreciated. Cor: PMI nondisplaced. Regular rate & rhythm. No rubs, gallops or murmurs. Lungs: clear Abdomen: soft, nontender, nondistended. No hepatosplenomegaly. No bruits or masses. Good bowel sounds. Extremities: no cyanosis, clubbing, rash, edema Neuro: alert & orientedx3, cranial nerves grossly intact. moves all 4 extremities w/o difficulty. Affect pleasant  EKG: SR 68 bpm personally reviewed. No PVCs.   ASSESSMENT &  PLAN:  1. Chronic systolic CHF:  - Cath with 1-v CAD with chronically occluded RCA. MRI in January 2011 with EF 31% with inferior scar.  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  - No f/u between to 2015-2021 - Echo 6/21 EF 20-25% severe central MR. Repeat cath 6/21 with stable CAD - Echo 07/07/20 EF 30-35% - NYHA II.  Volume status appears stable despite weight gain. Reds CLip  33%. Stable.  - Continue  Entresto 97/103 bid - Continue spiro 25 mg daily. -Increase carvedilol 9.375 mg twice a day.  - Continue Farxiga 10 mg daily  . EF coming back up but still < 35%.  - Check BEMT   2. CAD:  - Occluded RCA on cath -No chest pain.  - Continue asa 81 mg daily.  - Continue atorvastatin 40   3. PVCs - EKG without PVCs. Stop amiodarone.   Social: Uses [employer] for rides. Consult HF SW for transportation. Currently on disability. Needs to remain out of work .   Follow up in 4 weeks with additional med titration.  Zachary Geathers NP-C 09/08/2020 10:39 AM

## 2020-09-08 ENCOUNTER — Ambulatory Visit (HOSPITAL_COMMUNITY)
Admission: RE | Admit: 2020-09-08 | Discharge: 2020-09-08 | Disposition: A | Discharge status: Home / Self Care | Payer: BC Managed Care – PPO | Source: Ambulatory Visit | Attending: Adult Health | Admitting: Adult Health

## 2020-09-08 ENCOUNTER — Other Ambulatory Visit: Payer: Self-pay

## 2020-09-08 VITALS — BP 139/86 | HR 68 | Wt 168.2 lb

## 2020-09-08 DIAGNOSIS — Z7982 Long term (current) use of aspirin: Secondary | ICD-10-CM | POA: Insufficient documentation

## 2020-09-08 DIAGNOSIS — I255 Ischemic cardiomyopathy: Secondary | ICD-10-CM | POA: Diagnosis not present

## 2020-09-08 DIAGNOSIS — Z79899 Other long term (current) drug therapy: Secondary | ICD-10-CM | POA: Diagnosis not present

## 2020-09-08 DIAGNOSIS — I2510 Atherosclerotic heart disease of native coronary artery without angina pectoris: Secondary | ICD-10-CM | POA: Insufficient documentation

## 2020-09-08 DIAGNOSIS — I493 Ventricular premature depolarization: Secondary | ICD-10-CM | POA: Diagnosis not present

## 2020-09-08 DIAGNOSIS — I110 Hypertensive heart disease with heart failure: Secondary | ICD-10-CM | POA: Insufficient documentation

## 2020-09-08 DIAGNOSIS — Z8249 Family history of ischemic heart disease and other diseases of the circulatory system: Secondary | ICD-10-CM | POA: Diagnosis not present

## 2020-09-08 DIAGNOSIS — I5022 Chronic systolic (congestive) heart failure: Secondary | ICD-10-CM | POA: Diagnosis not present

## 2020-09-08 DIAGNOSIS — R0789 Other chest pain: Secondary | ICD-10-CM | POA: Insufficient documentation

## 2020-09-08 DIAGNOSIS — Z7984 Long term (current) use of oral hypoglycemic drugs: Secondary | ICD-10-CM | POA: Insufficient documentation

## 2020-09-08 DIAGNOSIS — L905 Scar conditions and fibrosis of skin: Secondary | ICD-10-CM | POA: Diagnosis not present

## 2020-09-08 DIAGNOSIS — I447 Left bundle-branch block, unspecified: Secondary | ICD-10-CM | POA: Insufficient documentation

## 2020-09-08 LAB — BASIC METABOLIC PANEL
Anion gap: 10 (ref 5–15)
BUN: 26 mg/dL — ABNORMAL HIGH (ref 6–20)
CO2: 26 mmol/L (ref 22–32)
Calcium: 9.5 mg/dL (ref 8.9–10.3)
Chloride: 108 mmol/L (ref 98–111)
Creatinine, Ser: 1.43 mg/dL — ABNORMAL HIGH (ref 0.61–1.24)
GFR, Estimated: 57 mL/min — ABNORMAL LOW (ref >60)
Glucose, Bld: 107 mg/dL — ABNORMAL HIGH (ref 70–99)
Potassium: 4.3 mmol/L (ref 3.5–5.1)
Sodium: 144 mmol/L (ref 135–145)

## 2020-09-08 MED ORDER — CARVEDILOL 6.25 MG PO TABS
9.3750 mg | ORAL_TABLET | Freq: Two times a day (BID) | ORAL | 3 refills | Status: DC
Start: 2020-09-08 — End: 2021-01-05

## 2020-09-08 NOTE — Progress Notes (Signed)
ReDS Vest / Clip - 09/08/20 1100      ReDS Vest / Clip   Station Marker C    Ruler Value 31.5    ReDS Value Range Low volume    ReDS Actual Value 33    Anatomical Comments sitting

## 2020-09-08 NOTE — Patient Instructions (Signed)
STOP Amiodarone INCREASE Coreg to 9.375 mg, one and one half tab twice a day  Labs today We will only contact you if something comes back abnormal or we need to make some changes. Otherwise no news is good news!  Your physician recommends that you schedule a follow-up appointment in: 4 weeks  in the Advanced Practitioners (PA/NP) Clinic     If you have any questions or concerns before your next appointment please send [place] a message through [place] or call our office at [phone].    TO LEAVE A MESSAGE FOR THE NURSE SELECT OPTION 2, PLEASE LEAVE A MESSAGE INCLUDING:  YOUR NAME  DATE OF BIRTH  CALL BACK NUMBER  REASON FOR CALL**this is important as we prioritize the call backs  YOU WILL RECEIVE A CALL BACK THE SAME DAY AS LONG AS YOU CALL BEFORE 4:00 PM

## 2020-09-16 ENCOUNTER — Encounter (HOSPITAL_COMMUNITY): Payer: Self-pay

## 2020-10-05 NOTE — Progress Notes (Signed)
ADVANCED HF CLINIC NOTE   Patient ID: Zachary Arias, male   DOB: 01-13-1961, 60 y.o.   MRN: [id number] PCP: [name] HF MD: [name]   HPI: Zachary Arias is a 60 year old male  year gentlemen with history of HF due to mixed ischemic/no-ischemic CM (diagnosed 9/10) with EF 25-30% range. Cath with 1-v CAD with chronically occluded RCA. MRI in January 2011 with EF 31% with inferior scar.   Brother had heart transplant in [place] several months ago. Dad also had previous heart transplant.   He was lost to f/u since 2015. Admitted 03/05/20 with cardiogenic shock after stopping meds for months and using supplements. SBP in 70-80s. ECHO EF 20-25% RV normal. Severe MR. Having frequent PVCs. Placed on milrinone to suppress PVCs  Cath 03/10/20. R/LHC showed stable CAD (chronically occluded distal RCA and moderate non-obstructive CAD elsewhere), low filling pressures and normal cardiac output on milrinone 0.25.    Today he returns for HF follow up.Overall feeling fine. SOB every now and then. Denies PND/Orthopnea. Walking 15-20 minutes a day. Appetite ok. No fever or chills. Scale at home not working. Taking all medications.   He has been disabled since May 2021  He needs assistance with transportation. Lives alone. He uses [employer].    Echo April 2011 showed EF 30-35%. Echo August 2013 showed EF 45-50% with inferior and inferoseptal hypokinesis Echo  07/07/20 EF 30-35%  SH: Lives in [place], nonsmoker. He has been disabled since May. He needs assistance with transportation. Lives alone. He uses [employer].   FH: No premature CAD  ROS: All systems negative except as listed in HPI, PMH and Problem List.  Past Medical History:  Diagnosis Date  . Congestive heart failure (CHF) (HCC)    secondary to ischemic cardiomyopathy with an ejection fraction of 25-30%  . Coronary artery disease   . History of hypertension   . Inguinal hernia 2005   History of left inguinal  hernia, status post repair in 2005  . Tachycardia 2010     Unexplained tachycardia during hospitalization    Current Outpatient Medications  Medication Sig Dispense Refill  . aspirin EC 81 MG tablet Take 1 tablet (81 mg total) by mouth daily. 90 tablet 3  . atorvastatin (LIPITOR) 40 MG tablet TAKE 1 TABLET DAILY (REPLACES SIMVASTATIN) 30 tablet 3  . carvedilol (COREG) 6.25 MG tablet Take 1.5 tablets (9.375 mg total) by mouth 2 (two) times daily. 90 tablet 3  . dapagliflozin propanediol (FARXIGA) 10 MG TABS tablet Take 1 tablet (10 mg total) by mouth daily before breakfast. 30 tablet 11  . Multiple Vitamins-Minerals (CENTRUM ULTRA MENS PO) Take 1 tablet by mouth daily.     . sacubitril-valsartan (ENTRESTO) 97-103 MG Take 1 tablet by mouth 2 (two) times daily. 60 tablet 3  . spironolactone (ALDACTONE) 25 MG tablet Take 0.5 tablets (12.5 mg total) by mouth daily. 15 tablet 3   No current facility-administered medications for this encounter.     PHYSICAL EXAM: Vitals:   10/06/20 1429  BP: 132/86  Pulse: 71  SpO2: 98%  Weight: 75.1 kg (165 lb 9.6 oz)    Wt Readings from Last 3 Encounters:  10/06/20 75.1 kg (165 lb 9.6 oz)  09/08/20 76.3 kg (168 lb 3.2 oz)  07/07/20 75 kg (165 lb 6 oz)    General:  Well appearing. No resp difficulty HEENT: normal Neck: supple. no JVD. Carotids 2+ bilat; no bruits. No lymphadenopathy or thryomegaly appreciated. Cor: PMI  nondisplaced. Regular rate & rhythm. No rubs, gallops or murmurs. Lungs: clear Abdomen: soft, nontender, nondistended. No hepatosplenomegaly. No bruits or masses. Good bowel sounds. Extremities: no cyanosis, clubbing, rash, edema Neuro: alert & orientedx3, cranial nerves grossly intact. moves all 4 extremities w/o difficulty. Affect pleasant  EKG: SR 64 bpm personally reviewed.   ASSESSMENT & PLAN:  1. Chronic systolic CHF:  - Cath with 1-v CAD with chronically occluded RCA. MRI in January 2011 with EF 31% with inferior scar.   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  - No f/u between to 2015-2021 - Echo 6/21 EF 20-25% severe central MR. Repeat cath 6/21 with stable CAD - Echo 07/07/20 EF 30-35% NYHA II. Volume status stable - Continue  Entresto 97/103 bid - Increase spironolactone 25 mg daily. Check BMET in 7 days.  - Continue carvedilol 9.375 mg twice a day.  - Continue Farxiga 10 mg daily   2. CAD:  - Occluded RCA on cath -No chest pain.  - Continue asa 81 mg daily.  - Continue atorvastatin 40   3. PVCs -Last visit amiodarone was stopped.   Follow up in 2-3 months with an ECHO.   [name] NP-C 10/06/2020 2:44 PM

## 2020-10-06 ENCOUNTER — Other Ambulatory Visit: Payer: Self-pay

## 2020-10-06 ENCOUNTER — Encounter (HOSPITAL_COMMUNITY): Payer: Self-pay

## 2020-10-06 ENCOUNTER — Ambulatory Visit (HOSPITAL_COMMUNITY)
Admission: RE | Admit: 2020-10-06 | Discharge: 2020-10-06 | Disposition: A | Discharge status: Home / Self Care | Payer: BC Managed Care – PPO | Source: Ambulatory Visit | Attending: Adult Health | Admitting: Adult Health

## 2020-10-06 VITALS — BP 132/86 | HR 71 | Wt 165.6 lb

## 2020-10-06 DIAGNOSIS — Z7984 Long term (current) use of oral hypoglycemic drugs: Secondary | ICD-10-CM | POA: Diagnosis not present

## 2020-10-06 DIAGNOSIS — Z7982 Long term (current) use of aspirin: Secondary | ICD-10-CM | POA: Insufficient documentation

## 2020-10-06 DIAGNOSIS — I447 Left bundle-branch block, unspecified: Secondary | ICD-10-CM | POA: Diagnosis not present

## 2020-10-06 DIAGNOSIS — Z79899 Other long term (current) drug therapy: Secondary | ICD-10-CM | POA: Insufficient documentation

## 2020-10-06 DIAGNOSIS — I5022 Chronic systolic (congestive) heart failure: Secondary | ICD-10-CM | POA: Insufficient documentation

## 2020-10-06 DIAGNOSIS — I2510 Atherosclerotic heart disease of native coronary artery without angina pectoris: Secondary | ICD-10-CM | POA: Insufficient documentation

## 2020-10-06 DIAGNOSIS — Z8249 Family history of ischemic heart disease and other diseases of the circulatory system: Secondary | ICD-10-CM | POA: Diagnosis not present

## 2020-10-06 DIAGNOSIS — I110 Hypertensive heart disease with heart failure: Secondary | ICD-10-CM | POA: Insufficient documentation

## 2020-10-06 DIAGNOSIS — I255 Ischemic cardiomyopathy: Secondary | ICD-10-CM | POA: Diagnosis not present

## 2020-10-06 DIAGNOSIS — I493 Ventricular premature depolarization: Secondary | ICD-10-CM | POA: Insufficient documentation

## 2020-10-06 MED ORDER — SPIRONOLACTONE 25 MG PO TABS
25.0000 mg | ORAL_TABLET | Freq: Every day | ORAL | 3 refills | Status: DC
Start: 2020-10-06 — End: 2021-08-04

## 2020-10-06 NOTE — Patient Instructions (Signed)
It was great to see you today! No medication changes are needed at this time.  Labs needed in 7-10 days  Your physician recommends that you schedule a follow-up appointment in: 3 months with [name] and echo  Your physician has requested that you have an echocardiogram. Echocardiography is a painless test that uses sound waves to create images of your heart. It provides your doctor with information about the size and shape of your heart and how well your hearts chambers and valves are working. This procedure takes approximately one hour. There are no restrictions for this procedure.   If you have any questions or concerns before your next appointment please send [place] a message through [place] or call our office at [phone].    TO LEAVE A MESSAGE FOR THE NURSE SELECT OPTION 2, PLEASE LEAVE A MESSAGE INCLUDING:  YOUR NAME  DATE OF BIRTH  CALL BACK NUMBER  REASON FOR CALL**this is important as we prioritize the call backs  YOU WILL RECEIVE A CALL BACK THE SAME DAY AS LONG AS YOU CALL BEFORE 4:00 PM  Do the following things EVERYDAY: 1) Weigh yourself in the morning before breakfast. Write it down and keep it in a log. 2) Take your medicines as prescribed 3) Eat low salt foods--Limit salt (sodium) to 2000 mg per day.  4) Stay as active as you can everyday 5) Limit all fluids for the day to less than 2 liters

## 2020-10-15 ENCOUNTER — Encounter (HOSPITAL_COMMUNITY): Payer: Self-pay

## 2020-10-15 ENCOUNTER — Other Ambulatory Visit: Payer: Self-pay

## 2020-10-15 ENCOUNTER — Ambulatory Visit (HOSPITAL_COMMUNITY)
Admission: RE | Admit: 2020-10-15 | Discharge: 2020-10-15 | Disposition: A | Discharge status: Home / Self Care | Payer: BC Managed Care – PPO | Source: Ambulatory Visit | Attending: Cardiology | Admitting: Cardiology

## 2020-10-15 DIAGNOSIS — I5022 Chronic systolic (congestive) heart failure: Secondary | ICD-10-CM | POA: Diagnosis present

## 2020-10-15 LAB — BASIC METABOLIC PANEL
Anion gap: 8 (ref 5–15)
BUN: 18 mg/dL (ref 6–20)
CO2: 27 mmol/L (ref 22–32)
Calcium: 9.4 mg/dL (ref 8.9–10.3)
Chloride: 105 mmol/L (ref 98–111)
Creatinine, Ser: 1.55 mg/dL — ABNORMAL HIGH (ref 0.61–1.24)
GFR, Estimated: 52 mL/min — ABNORMAL LOW (ref >60)
Glucose, Bld: 99 mg/dL (ref 70–99)
Potassium: 4.4 mmol/L (ref 3.5–5.1)
Sodium: 140 mmol/L (ref 135–145)

## 2020-10-16 ENCOUNTER — Telehealth (HOSPITAL_COMMUNITY): Payer: Self-pay | Admitting: *Deleted

## 2020-10-16 NOTE — Telephone Encounter (Signed)
Pt sent a mychart message stating he can't afford two of his medications. (please see below)  Routed to [facility]   "Hi, I am low on 2 of my medications [name], and [name] ). I went to the drug store today to pick them up and was told that I needed to pay $310.00 for them, I cannot afford that, and it seems that my insurance will not cover for it. I don't know what to do at this point, I am running low. Can you please help? Thanks."

## 2020-10-16 NOTE — Telephone Encounter (Signed)
Patient is commercially insured so copay cards can be used to bring his cost down. Called pharmacy and they were able to use the [facility] copay card on file. His new copay is $10. I also provided the pharmacy with copay card for Farxiga. Cost will be $0. Patient aware.

## 2020-10-24 ENCOUNTER — Encounter (HOSPITAL_COMMUNITY): Payer: Self-pay

## 2020-10-27 ENCOUNTER — Encounter (HOSPITAL_COMMUNITY): Payer: Self-pay

## 2020-10-27 ENCOUNTER — Encounter (HOSPITAL_COMMUNITY): Payer: Self-pay | Admitting: *Deleted

## 2020-11-03 NOTE — Telephone Encounter (Signed)
Pt's forms for work accommodations have been completed, signed by [name], and faxed to human resources at [phone], message sent to pt to inform him this was done, copy mailed to him for his records.

## 2020-11-07 ENCOUNTER — Encounter (HOSPITAL_COMMUNITY): Payer: Self-pay

## 2020-11-07 NOTE — Progress Notes (Signed)
Records request from Disability Determination Services received. Records sent to [phone]. Request sent in to be scanned into chart

## 2020-11-17 ENCOUNTER — Other Ambulatory Visit (HOSPITAL_COMMUNITY): Payer: Self-pay | Admitting: Internal Medicine

## 2020-12-09 ENCOUNTER — Other Ambulatory Visit (HOSPITAL_COMMUNITY): Payer: Self-pay | Admitting: Internal Medicine

## 2020-12-29 DIAGNOSIS — Z736 Limitation of activities due to disability: Secondary | ICD-10-CM

## 2021-01-03 NOTE — Progress Notes (Signed)
ADVANCED HF CLINIC NOTE   Patient ID: Zachary Arias, male   DOB: 07-Oct-1960, 60 y.o.   MRN: [id number] PCP: Dr. 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 Cath with 1-v CAD with chronically occluded RCA. MRI in January 2011 with EF 31% with inferior scar.   Brother had heart transplant in [place] several months ago. Dad also had previous heart transplant.   He was lost to f/u since 2015. Admitted 03/05/20 with cardiogenic shock after stopping meds for months and using supplements. SBP in 70-80s. ECHO EF 20-25% RV normal. Severe MR. Having frequent PVCs. Placed on milrinone to suppress PVCs  Cath 03/10/20. R/LHC showed stable CAD (chronically occluded distal RCA and moderate non-obstructive CAD elsewhere), low filling pressures and normal cardiac output on milrinone 0.25.   At last visit spiro increased to 25mg  but he felt funny so cut back to 12.5 and he felt better   Today he returns for HF follow up. Feels pretty good. Doing push-ups and shadow boxing without difficulty. Says he is ok until he gets stressed out then his chest gets tight and gets SOB. No orthopnea, edema and PND. Complaint with all meds.   Echo today 01/05/21 EF 40-45% mild to mod MR RV mild HK    Echo April 2011 showed EF 30-35%. Echo August 2013 showed EF 45-50% with inferior and inferoseptal hypokinesis Echo  07/07/20 EF 30-35%    SH: Lives in [place], nonsmoker. He has been disabled since May. He needs assistance with transportation. Lives alone. He uses [employer] grocer[employer].   FH: No premature CAD  ROS: All systems negative except as listed in HPI, PMH and Problem List.  Past Medical History:  Diagnosis Date  . Congestive heart failure (CHF) (HCC)    secondary to ischemic cardiomyopathy with an ejection fraction of 25-30%  . Coronary artery disease   . History of  hypertension   . Inguinal hernia 2005   History of left inguinal hernia, status post repair in 2005  . Tachycardia 2010     Unexplained tachycardia during hospitalization    Current Outpatient Medications  Medication Sig Dispense Refill  . aspirin EC 81 MG tablet Take 1 tablet (81 mg total) by mouth daily. 90 tablet 3  . atorvastatin (LIPITOR) 40 MG tablet TAKE 1 TABLET DAILY        (REPLACES SIMVASTATIN) 90 tablet 2  . carvedilol (COREG) 6.25 MG tablet Take 1.5 tablets (9.375 mg total) by mouth 2 (two) times daily. 90 tablet 3  . dapagliflozin propanediol (FARXIGA) 10 MG TABS tablet Take 1 tablet (10 mg total) by mouth daily before breakfast. 30 tablet 11  . ENTRESTO 97-103 MG TAKE ONE (1) TABLET BY MOUTH TWO (2) TIMES DAILY 60 tablet 3  . Multiple Vitamins-Minerals (CENTRUM ULTRA MENS PO) Take 1 tablet by mouth daily.     2006 spironolactone (ALDACTONE) 25 MG tablet Take 1 tablet (25 mg total) by mouth daily. 30 tablet 3   No current facility-administered medications for this encounter.     PHYSICAL EXAM: Vitals:   01/05/21 1357  BP: 120/80  Pulse: 67  SpO2: 100%  Weight: 76.8 kg (169 lb 6.4 oz)    Wt Readings from Last 3 Encounters:  01/05/21 76.8 kg (169 lb 6.4 oz)  10/06/20 75.1 kg (165 lb 9.6 oz)  09/08/20 76.3 kg (168 lb  3.2 oz)    General:  Well appearing. No resp difficulty HEENT: normal Neck: supple. no JVD. Carotids 2+ bilat; no bruits. No lymphadenopathy or thryomegaly appreciated. Cor: PMI nondisplaced. Regular rate & rhythm. No rubs, gallops or murmurs. Lungs: clear Abdomen: soft, nontender, nondistended. No hepatosplenomegaly. No bruits or masses. Good bowel sounds. Extremities: no cyanosis, clubbing, rash, edema Neuro: alert & orientedx3, cranial nerves grossly intact. moves all 4 extremities w/o difficulty. Affect pleasant   ASSESSMENT & PLAN:  1. Chronic systolic CHF:  - Cath with 1-v CAD with chronically occluded RCA. MRI in January 2011 with EF 31%  with inferior scar.  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  - No f/u between to 2015-2021 - Echo 6/21 EF 20-25% severe central MR. Repeat cath 6/21 with stable CAD - Echo 07/07/20 EF 30-35% - Echo today 01/05/21 EF 40-45% mild to mod MR RV mild HK  - NYHA I-II Volume status looks good - Continue  Entresto 97/103 bid - Continue spironolactone 12.5 mg daily.  - Continue carvedilol 9.375 mg twice a day. (we discussed going to 12.5 bid but he is reluctant to change now) - Continue Farxiga 10 mg daily   2. CAD:  - Occluded RCA on cath - Rare angina - Continue asa 81 mg daily.  - Continue atorvastatin 40   3. PVCs -Last visit amiodarone was stopped.   4. CKD 3a - check labs today - continue Renato Battles MD 01/03/2021 11:21 PM

## 2021-01-05 ENCOUNTER — Ambulatory Visit (HOSPITAL_COMMUNITY)
Admission: RE | Admit: 2021-01-05 | Discharge: 2021-01-05 | Disposition: A | Discharge status: Home / Self Care | Payer: BC Managed Care – PPO | Source: Ambulatory Visit | Attending: Adult Health | Admitting: Adult Health

## 2021-01-05 ENCOUNTER — Other Ambulatory Visit: Payer: Self-pay

## 2021-01-05 ENCOUNTER — Ambulatory Visit (HOSPITAL_BASED_OUTPATIENT_CLINIC_OR_DEPARTMENT_OTHER)
Admission: RE | Admit: 2021-01-05 | Discharge: 2021-01-05 | Disposition: A | Discharge status: Home / Self Care | Payer: BC Managed Care – PPO | Source: Ambulatory Visit | Attending: Internal Medicine | Admitting: Internal Medicine

## 2021-01-05 ENCOUNTER — Encounter (HOSPITAL_COMMUNITY): Payer: Self-pay | Admitting: Internal Medicine

## 2021-01-05 VITALS — BP 120/80 | HR 67 | Wt 169.4 lb

## 2021-01-05 DIAGNOSIS — I2510 Atherosclerotic heart disease of native coronary artery without angina pectoris: Secondary | ICD-10-CM | POA: Diagnosis not present

## 2021-01-05 DIAGNOSIS — I25119 Atherosclerotic heart disease of native coronary artery with unspecified angina pectoris: Secondary | ICD-10-CM | POA: Diagnosis not present

## 2021-01-05 DIAGNOSIS — Z79899 Other long term (current) drug therapy: Secondary | ICD-10-CM | POA: Insufficient documentation

## 2021-01-05 DIAGNOSIS — I130 Hypertensive heart and chronic kidney disease with heart failure and stage 1 through stage 4 chronic kidney disease, or unspecified chronic kidney disease: Secondary | ICD-10-CM | POA: Insufficient documentation

## 2021-01-05 DIAGNOSIS — Z7984 Long term (current) use of oral hypoglycemic drugs: Secondary | ICD-10-CM | POA: Insufficient documentation

## 2021-01-05 DIAGNOSIS — Z7982 Long term (current) use of aspirin: Secondary | ICD-10-CM | POA: Diagnosis not present

## 2021-01-05 DIAGNOSIS — I5022 Chronic systolic (congestive) heart failure: Secondary | ICD-10-CM

## 2021-01-05 DIAGNOSIS — I080 Rheumatic disorders of both mitral and aortic valves: Secondary | ICD-10-CM | POA: Insufficient documentation

## 2021-01-05 DIAGNOSIS — Z8249 Family history of ischemic heart disease and other diseases of the circulatory system: Secondary | ICD-10-CM | POA: Insufficient documentation

## 2021-01-05 DIAGNOSIS — I493 Ventricular premature depolarization: Secondary | ICD-10-CM | POA: Diagnosis not present

## 2021-01-05 DIAGNOSIS — N1831 Chronic kidney disease, stage 3a: Secondary | ICD-10-CM

## 2021-01-05 DIAGNOSIS — I428 Other cardiomyopathies: Secondary | ICD-10-CM | POA: Insufficient documentation

## 2021-01-05 DIAGNOSIS — I255 Ischemic cardiomyopathy: Secondary | ICD-10-CM | POA: Diagnosis not present

## 2021-01-05 LAB — ECHOCARDIOGRAM COMPLETE
Area-P 1/2: 2.76 cm2
Calc EF: 39.2 %
MV M vel: 4.82 m/s
MV Peak grad: 92.9 mmHg
S' Lateral: 4.8 cm
Single Plane A2C EF: 41.1 %
Single Plane A4C EF: 41.6 %

## 2021-01-05 MED ORDER — CARVEDILOL 6.25 MG PO TABS
9.3750 mg | ORAL_TABLET | Freq: Two times a day (BID) | ORAL | 6 refills | Status: DC
Start: 2021-01-05 — End: 2021-08-04

## 2021-01-05 NOTE — Patient Instructions (Signed)
Please call our office in September to schedule your follow up appointment  If you have any questions or concerns before your next appointment please send us a message through mychart or call our office at [phone].    TO LEAVE A MESSAGE FOR THE NURSE SELECT OPTION 2, PLEASE LEAVE A MESSAGE INCLUDING: . YOUR NAME . DATE OF BIRTH . CALL BACK NUMBER . REASON FOR CALL**this is important as we prioritize the call backs  YOU WILL RECEIVE A CALL BACK THE SAME DAY AS LONG AS YOU CALL BEFORE 4:00 PM  At the Advanced [facility] Failure Clinic, you and your health needs are our priority. As part of our continuing mission to provide you with exceptional heart care, we have created designated Provider Care Teams. These Care Teams include your primary Cardiologist (physician) and Advanced Practice Providers (APPs- Physician Assistants and Nurse Practitioners) who all work together to provide you with the care you need, when you need it.   You may see any of the following providers on your designated Care Team at your next follow up: . Dr Daniel Bensimhon . Dr Dalton McLean . Dr Brandon Winfrey . Amy Clegg, NP . Brittainy Simmons, PA . Jessica Milford,NP . Lauren Kemp, PharmD   Please be sure to bring in all your medications bottles to every appointment.    

## 2021-01-05 NOTE — Addendum Note (Signed)
Encounter addended by: Noralee Space, RN on: 01/05/2021 2:39 PM  Actions taken: Clinical Note Signed

## 2021-01-05 NOTE — Progress Notes (Incomplete)
Echocardiogram 2D Echocardiogram has been performed.  Zachary Arias 01/05/2021, 1:31 PM

## 2021-01-05 NOTE — Addendum Note (Signed)
Encounter addended by: Noralee Space, RN on: 01/05/2021 2:58 PM  Actions taken: Order list changed, Clinical Note Signed

## 2021-01-05 NOTE — Progress Notes (Signed)
Pt brought disability forms to be completed today, Per [name] pt is not able to lift greater than 30 lbs and requires frequent rest breaks. Pt's attached job descriptions states pt must lift 50 lbs regularly and 70 lbs at times. Forms completed that pt can not perform these job functions and signed by [name], forms faxed in to [phone] and copy given to pt

## 2021-01-09 ENCOUNTER — Encounter (HOSPITAL_COMMUNITY): Payer: Self-pay

## 2021-02-28 ENCOUNTER — Encounter (HOSPITAL_COMMUNITY): Payer: Self-pay

## 2021-03-09 ENCOUNTER — Encounter (HOSPITAL_COMMUNITY): Payer: Self-pay

## 2021-03-25 ENCOUNTER — Encounter (HOSPITAL_COMMUNITY): Payer: Self-pay | Admitting: *Deleted

## 2021-03-25 NOTE — Progress Notes (Signed)
Pt's LTD forms completed and signed by [name], forms faxed along with records from 10/28/20 to [facility] at [phone]

## 2021-04-01 ENCOUNTER — Other Ambulatory Visit (HOSPITAL_COMMUNITY): Payer: Self-pay | Admitting: Internal Medicine

## 2021-05-15 ENCOUNTER — Other Ambulatory Visit (HOSPITAL_COMMUNITY): Payer: Self-pay | Admitting: Internal Medicine

## 2021-08-04 ENCOUNTER — Other Ambulatory Visit (HOSPITAL_COMMUNITY): Payer: Self-pay | Admitting: *Deleted

## 2021-08-04 ENCOUNTER — Other Ambulatory Visit (HOSPITAL_COMMUNITY): Payer: Self-pay

## 2021-08-04 ENCOUNTER — Encounter (HOSPITAL_COMMUNITY): Payer: Self-pay | Admitting: Pharmacy Technician

## 2021-08-04 ENCOUNTER — Encounter (HOSPITAL_COMMUNITY): Payer: Self-pay

## 2021-08-04 MED ORDER — CARVEDILOL 6.25 MG PO TABS
9.3750 mg | ORAL_TABLET | Freq: Two times a day (BID) | ORAL | 3 refills | Status: DC
  Filled 2021-08-04: qty 90, 30d supply, fill #0

## 2021-08-04 MED ORDER — SPIRONOLACTONE 25 MG PO TABS
25.0000 mg | ORAL_TABLET | Freq: Every day | ORAL | 3 refills | Status: DC
  Filled 2021-08-04: qty 30, 30d supply, fill #0

## 2021-08-04 MED ORDER — DAPAGLIFLOZIN PROPANEDIOL 10 MG PO TABS
10.0000 mg | ORAL_TABLET | Freq: Every day | ORAL | 3 refills | Status: DC
  Filled 2021-08-04: qty 30, 30d supply, fill #0

## 2021-08-04 MED ORDER — ATORVASTATIN CALCIUM 40 MG PO TABS
40.0000 mg | ORAL_TABLET | Freq: Every day | ORAL | 3 refills | Status: DC
  Filled 2021-08-04: qty 30, 30d supply, fill #0

## 2021-08-12 ENCOUNTER — Other Ambulatory Visit (HOSPITAL_COMMUNITY): Payer: Self-pay

## 2021-08-18 ENCOUNTER — Other Ambulatory Visit (HOSPITAL_COMMUNITY): Payer: Self-pay

## 2021-08-19 ENCOUNTER — Other Ambulatory Visit (HOSPITAL_COMMUNITY): Payer: Self-pay

## 2021-08-19 MED ORDER — ENTRESTO 97-103 MG PO TABS: 1.0000 | ORAL_TABLET | Freq: Two times a day (BID) | ORAL | 3 refills | Status: DC

## 2021-08-26 ENCOUNTER — Telehealth (HOSPITAL_COMMUNITY): Payer: Self-pay | Admitting: Pharmacy Technician

## 2021-08-26 NOTE — Telephone Encounter (Signed)
Advanced Heart Failure Patient Advocate Encounter  Sent in [employer] application via fax, 11/29.  Will follow up.

## 2021-09-07 ENCOUNTER — Encounter (HOSPITAL_COMMUNITY): Payer: Self-pay | Admitting: Pharmacy Technician

## 2021-09-07 NOTE — Telephone Encounter (Signed)
Advanced Heart Failure Patient Advocate Encounter  Called [employer] to check the status of the patient's application. Representative stated that the patient has to call in for a financial hardship form. They will not accept the no income letter sent in by the office. Also confirmed that the patient does not currently have insurance.   Sent patient mychart message.

## 2021-10-02 ENCOUNTER — Encounter (HOSPITAL_COMMUNITY): Payer: Self-pay | Admitting: Internal Medicine

## 2021-11-10 ENCOUNTER — Encounter (HOSPITAL_COMMUNITY): Payer: Self-pay

## 2021-11-10 ENCOUNTER — Encounter (HOSPITAL_COMMUNITY): Payer: Self-pay | Admitting: Internal Medicine

## 2021-11-19 ENCOUNTER — Other Ambulatory Visit (HOSPITAL_COMMUNITY): Payer: Self-pay | Admitting: Internal Medicine

## 2021-11-19 ENCOUNTER — Ambulatory Visit (HOSPITAL_COMMUNITY)
Admission: RE | Admit: 2021-11-19 | Discharge: 2021-11-19 | Disposition: A | Discharge status: Home / Self Care | Payer: Medicaid Other | Source: Ambulatory Visit | Attending: Internal Medicine | Admitting: Internal Medicine

## 2021-11-19 ENCOUNTER — Other Ambulatory Visit (HOSPITAL_COMMUNITY): Payer: Self-pay

## 2021-11-19 ENCOUNTER — Other Ambulatory Visit: Payer: Self-pay

## 2021-11-19 ENCOUNTER — Encounter (HOSPITAL_COMMUNITY): Payer: Self-pay

## 2021-11-19 ENCOUNTER — Ambulatory Visit (HOSPITAL_COMMUNITY)
Admission: RE | Admit: 2021-11-19 | Discharge: 2021-11-19 | Disposition: A | Discharge status: Home / Self Care | Payer: BC Managed Care – PPO | Source: Ambulatory Visit | Attending: Internal Medicine | Admitting: Internal Medicine

## 2021-11-19 VITALS — BP 132/90 | HR 119 | Wt 177.4 lb

## 2021-11-19 DIAGNOSIS — Z7984 Long term (current) use of oral hypoglycemic drugs: Secondary | ICD-10-CM | POA: Insufficient documentation

## 2021-11-19 DIAGNOSIS — Z5982 Transportation insecurity: Secondary | ICD-10-CM | POA: Diagnosis not present

## 2021-11-19 DIAGNOSIS — I493 Ventricular premature depolarization: Secondary | ICD-10-CM

## 2021-11-19 DIAGNOSIS — N1831 Chronic kidney disease, stage 3a: Secondary | ICD-10-CM | POA: Insufficient documentation

## 2021-11-19 DIAGNOSIS — I5022 Chronic systolic (congestive) heart failure: Secondary | ICD-10-CM | POA: Insufficient documentation

## 2021-11-19 DIAGNOSIS — Z7982 Long term (current) use of aspirin: Secondary | ICD-10-CM | POA: Insufficient documentation

## 2021-11-19 DIAGNOSIS — I255 Ischemic cardiomyopathy: Secondary | ICD-10-CM | POA: Diagnosis not present

## 2021-11-19 DIAGNOSIS — I428 Other cardiomyopathies: Secondary | ICD-10-CM | POA: Diagnosis not present

## 2021-11-19 DIAGNOSIS — I2582 Chronic total occlusion of coronary artery: Secondary | ICD-10-CM | POA: Insufficient documentation

## 2021-11-19 DIAGNOSIS — Z79899 Other long term (current) drug therapy: Secondary | ICD-10-CM | POA: Diagnosis not present

## 2021-11-19 DIAGNOSIS — I25119 Atherosclerotic heart disease of native coronary artery with unspecified angina pectoris: Secondary | ICD-10-CM | POA: Insufficient documentation

## 2021-11-19 DIAGNOSIS — I130 Hypertensive heart and chronic kidney disease with heart failure and stage 1 through stage 4 chronic kidney disease, or unspecified chronic kidney disease: Secondary | ICD-10-CM | POA: Diagnosis present

## 2021-11-19 DIAGNOSIS — Z8249 Family history of ischemic heart disease and other diseases of the circulatory system: Secondary | ICD-10-CM | POA: Diagnosis not present

## 2021-11-19 LAB — BASIC METABOLIC PANEL
Anion gap: 9 (ref 5–15)
BUN: 12 mg/dL (ref 6–20)
CO2: 23 mmol/L (ref 22–32)
Calcium: 9.3 mg/dL (ref 8.9–10.3)
Chloride: 108 mmol/L (ref 98–111)
Creatinine, Ser: 1.17 mg/dL (ref 0.61–1.24)
GFR, Estimated: >60 mL/min (ref >60)
Glucose, Bld: 121 mg/dL — ABNORMAL HIGH (ref 70–99)
Potassium: 3.8 mmol/L (ref 3.5–5.1)
Sodium: 140 mmol/L (ref 135–145)

## 2021-11-19 LAB — BRAIN NATRIURETIC PEPTIDE: B Natriuretic Peptide: 588.9 pg/mL — ABNORMAL HIGH (ref 0.0–100.0)

## 2021-11-19 MED ORDER — DAPAGLIFLOZIN PROPANEDIOL 10 MG PO TABS
10.0000 mg | ORAL_TABLET | Freq: Every day | ORAL | 11 refills | Status: DC
  Filled 2021-11-19: qty 30, 30d supply, fill #0
  Filled 2021-12-17: qty 30, 30d supply, fill #1
  Filled 2021-12-31: qty 30, 30d supply, fill #2

## 2021-11-19 MED ORDER — FUROSEMIDE 20 MG PO TABS
20.0000 mg | ORAL_TABLET | ORAL | 11 refills | Status: DC | PRN
  Filled 2021-11-19: qty 30, 30d supply, fill #0
  Filled 2021-12-17: qty 30, 30d supply, fill #1
  Filled 2021-12-31: qty 30, 30d supply, fill #2

## 2021-11-19 MED ORDER — LOSARTAN POTASSIUM 25 MG PO TABS
25.0000 mg | ORAL_TABLET | Freq: Every day | ORAL | 3 refills | Status: DC
  Filled 2021-11-19: qty 30, 30d supply, fill #0

## 2021-11-19 NOTE — Progress Notes (Signed)
ADVANCED HF CLINIC NOTE   Patient ID: Zachary Arias, male   DOB: [birth year], 61 y.o.   MRN: [id number] PCP: 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 Cath with 1-v CAD with chronically occluded RCA. MRI in January 2011 with EF 31% with inferior scar.   Brother had heart transplant in [place] several months ago. Dad also had previous heart transplant.   He was lost to f/u since 2015. Admitted 03/05/20 with cardiogenic shock after stopping meds for months and using supplements. SBP in 70-80s. ECHO EF 20-25% RV normal. Severe MR. Having frequent PVCs. Placed on amiodarone to suppress PVCs  [facility] 03/10/20 showed stable CAD (chronically occluded distal RCA and moderate non-obstructive CAD elsewhere), low filling pressures and normal cardiac output on milrinone 0.25. Was able to wean off milrinone. Started back on GDMT.  Repeat echo 01/05/21 EF 40-45% mild to mod MR RV mild HK   He has been lost to f/u for several months. Last OV was 12/2020.  He returns to clinic today for f/u. Reports being off all of his meds except ASA. Claims he had difficulty affording but did not reach out to [place] to communicate this. Has been off most meds x 3 months.   He feels SOB w/  moderate activity but denies resting dyspnea. Denies orthopnea/PND. Complains of recent palpitations and occasional chest tightness lasting < 5 min in duration. EKG today shows NSR 98 bpm, 2 PVCs. No ischemic ST changes. BP 130/ 90.  ReDs Clip 34%  Reviewed prior documentation from pharmacy team regarding Entresto. Application for financial hardship assistance incomplete as pt has failed to summit required documentation for approval.     Echo April 2011 showed EF 30-35%. Echo August 2013 showed EF 45-50% with inferior and inferoseptal hypokinesis Echo  07/07/20 EF 30-35% Echo 01/05/21 EF 40-45% mild to mod MR RV mild HK      SH: Lives in [employer], nonsmoker. He has been disabled since May. He needs assistance with transportation. Lives alone. He uses personal shoppers groceries.   FH: No premature CAD  ROS: All systems negative except as listed in HPI, PMH and Problem List.  Past Medical History:  Diagnosis Date   Congestive heart failure (CHF) (HCC)    secondary to ischemic cardiomyopathy with an ejection fraction of 25-30%   Coronary artery disease    History of hypertension    Inguinal hernia 2005   History of left inguinal hernia, status post repair in 2005   Tachycardia 2010     Unexplained tachycardia during hospitalization    Current Outpatient Medications  Medication Sig Dispense Refill   ASPIRIN 81 PO Take 81 mg by mouth daily.     No current facility-administered medications for this encounter.     Vital Signs  Vitals:   11/19/21 1342  BP: 132/90  Pulse: (!) 119  SpO2: 98%  Weight: 80.5 kg (177 lb 6.4 oz)     Wt Readings from Last 3 Encounters:  11/19/21 80.5 kg (177 lb 6.4 oz)  01/05/21 76.8 kg (169 lb 6.4 oz)  10/06/20 75.1 kg (165 lb 9.6 oz)   PHYSICAL EXAM: ReDs Clip 34% General:  Well appearing. No respiratory difficulty HEENT: normal Neck: supple. no JVD. Carotids 2+ bilat; no bruits. No lymphadenopathy or thyromegaly appreciated. Cor: PMI nondisplaced. Regular rate & rhythm. No rubs, gallops or murmurs. Lungs: clear  Abdomen: soft, nontender, nondistended. No hepatosplenomegaly. No bruits or masses. Good bowel sounds. Extremities: no cyanosis, clubbing, rash, edema Neuro: alert & oriented x 3, cranial nerves grossly intact. moves all 4 extremities w/o difficulty. Affect pleasant.    ASSESSMENT & PLAN:  1. Chronic systolic CHF:  - Cath with 1-v CAD with chronically occluded RCA. MRI in January 2011 with EF 31% with inferior scar.  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  EF 45-50% on echo in 8/13.  - No f/u between to  2015-2021 - Echo 6/21 EF 20-25% severe central MR. Repeat cath 6/21 with stable CAD - Echo 07/07/20 EF 30-35% - Echo 01/05/21 EF 40-45% mild to mod MR RV mild HK  - Unfortunately lost to f/u again and has been off all HF meds/diuretics x 3 months  - NYHA II. Volume status ok. ReDs clip 34% . BP mild-mod elevated - Need to slowly get back on GDMT  - Restart Farxiga 10 mg daily  - Start Losartan 25 mg daily (unable to afford Entresto) - Lasix 20 mg PRN  - Check BMP today  - Will try adding back Coreg and spiro next visits  - Discussed importance of strict med compliance and importance of notifying HF team if out of meds  2. CAD:  - Occluded dRCA on cath - Rare angina - needs to improve compliance w/ meds  - Continue asa 81 mg daily.  - Restart atorvastatin 40 mg   3. PVCs - Amiodarone was stopped 08/2020  - EKG today w/ 2 PVCs, he complains of palpitations - Order 2 wk Zio, if high burden will need to restart amio vs mexiletine for suppression. Would favor mexiletine given young age - check labs today   4. CKD 3a - check BMP today  - restart Farxiga  Needs GDMT slowly added back. F/u in 2-3 weeks w/ PharmD or APP for continued titration.    Robbie Lis PA-C 11/19/2021 1:45 PM

## 2021-11-19 NOTE — Addendum Note (Signed)
Encounter addended by: Kerry Dory, CMA on: 11/19/2021 3:02 PM  Actions taken: Imaging Exam begun

## 2021-11-19 NOTE — Progress Notes (Signed)
ReDS Vest / Clip - 11/19/21 1300       ReDS Vest / Clip   Station Marker C    Ruler Value 32    ReDS Value Range Low volume    ReDS Actual Value 34

## 2021-11-19 NOTE — Telephone Encounter (Signed)
Patient is being switched to losartan and will come off of Entresto. No further action is needed at this time.  Archer Asa, CPhT

## 2021-11-19 NOTE — Addendum Note (Signed)
Encounter addended by: Micki Riley, RN on: 11/19/2021 3:02 PM  Actions taken: Imaging Exam begun

## 2021-11-19 NOTE — Patient Instructions (Signed)
RESTART Lasix 20 mg, one tab as needed for swelling or weight gain RESTART Losartan 25 mg, one tab daily START Farxiga 10 mg, one tab daily  Labs today We will only contact you if something comes back abnormal or we need to make some changes. Otherwise no news is good news!  Your provider has recommended that  you wear a Zio Patch for 14 days.  This monitor will record your heart rhythm for our review.  IF you have any symptoms while wearing the monitor please press the button.  If you have any issues with the patch or you notice a red or orange light on it please call the company at [phone].  Once you remove the patch please mail it back to the company as soon as possible so we can get the results.   Your physician recommends that you schedule a follow-up appointment in: 2-3 weeks  in the Advanced Practitioners (PA/NP) Clinic    Do the following things EVERYDAY: Weigh yourself in the morning before breakfast. Write it down and keep it in a log. Take your medicines as prescribed Eat low salt foods--Limit salt (sodium) to 2000 mg per day.  Stay as active as you can everyday Limit all fluids for the day to less than 2 liters  At the Advanced Heart Failure Clinic, you and your health needs are our priority. As part of our continuing mission to provide you with exceptional heart care, we have created designated Provider Care Teams. These Care Teams include your primary Cardiologist (physician) and Advanced Practice Providers (APPs- Physician Assistants and Nurse Practitioners) who all work together to provide you with the care you need, when you need it.   You may see any of the following providers on your designated Care Team at your next follow up: Dr Arvilla Meres Dr Carron Curie, NP Robbie Lis, [place] [facility], [place] [name], PharmD   Please be sure to bring in all your medications bottles to every appointment.   If you have any  questions or concerns before your next appointment please send [place] a message through [place] or call our office at [phone].    TO LEAVE A MESSAGE FOR THE NURSE SELECT OPTION 2, PLEASE LEAVE A MESSAGE INCLUDING: YOUR NAME DATE OF BIRTH CALL BACK NUMBER REASON FOR CALL**this is important as we prioritize the call backs  YOU WILL RECEIVE A CALL BACK THE SAME DAY AS LONG AS YOU CALL BEFORE 4:00 PM

## 2021-11-25 ENCOUNTER — Telehealth (HOSPITAL_COMMUNITY): Payer: Self-pay | Admitting: Cardiology

## 2021-11-25 ENCOUNTER — Other Ambulatory Visit (HOSPITAL_COMMUNITY): Payer: Self-pay

## 2021-11-25 MED ORDER — ATORVASTATIN CALCIUM 40 MG PO TABS
40.0000 mg | ORAL_TABLET | Freq: Every day | ORAL | 3 refills | Status: DC
  Filled 2021-11-25 – 2021-12-17 (×2): qty 30, 30d supply, fill #0
  Filled 2021-12-31 – 2022-02-05 (×2): qty 30, 30d supply, fill #1
  Filled 2022-02-26 – 2022-05-04 (×2): qty 30, 30d supply, fill #2

## 2021-11-25 NOTE — Telephone Encounter (Signed)
Renal fx and K stable. Fluid marker (BNP)mildly elevated ?  ?Start Farxiga 10 mg daily ?Start Losartan 25 mg daily  ?Take Lasix 20 mg x 1 tomorrow, then only as needed.  ?  ?F/u BMP and BNP in 7-10 days  ? ? ? ?Pt aware will repeat labs at pharm visit 3/8 ?

## 2021-11-25 NOTE — Telephone Encounter (Signed)
-----   Message from Consuelo Pandy, [place] sent at 11/19/2021  4:34 PM EST ----- ?Also restart statin. Send rx for atorvastatin 40 mg to pharmacy  ?

## 2021-11-29 IMAGING — CT CT ANGIO CHEST
2 of 6 series · 17 of 36 positions shown · IV contrast (omnipaque)
Comparison: Chest radiograph 03/04/2020 min

CLINICAL DATA: Shortness of breath, hypotension

EXAM:
CT ANGIOGRAPHY CHEST WITH CONTRAST
TECHNIQUE: Multidetector CT imaging of the chest was performed using the
standard protocol during bolus administration of intravenous
contrast. Multiplanar CT image reconstructions and MIPs were
obtained to evaluate the vascular anatomy.
CONTRAST:  75mL OMNIPAQUE IOHEXOL 350 MG/ML SOLN

[Series 7: pe thins · axial · 0.86mm/px · z∈[-247,+18]mm · 16 of 420 slices shown]
[im 21/420  lung]
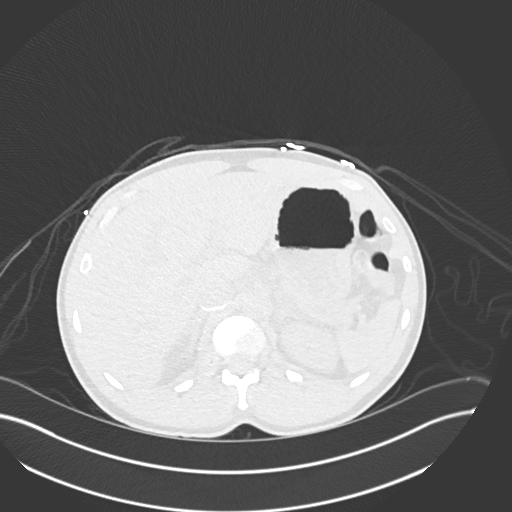
[im 42/420  mediastinal]
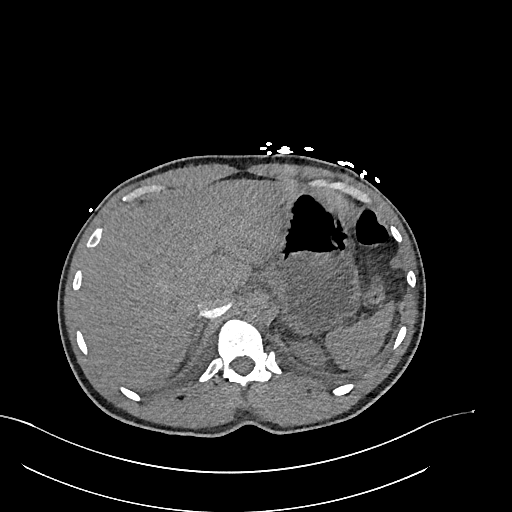
[im 63/420  lung]
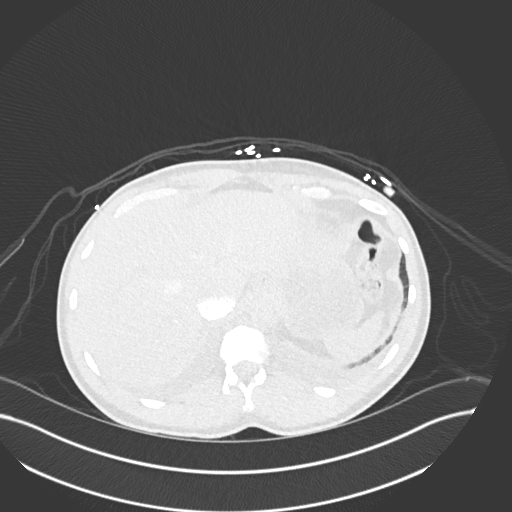
[im 105/420  mediastinal]
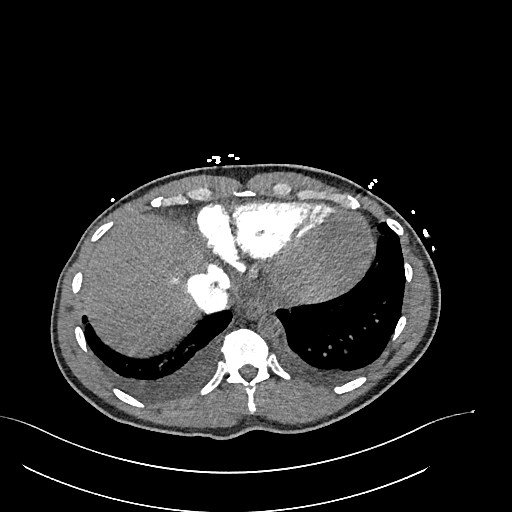
[im 126/420  lung]
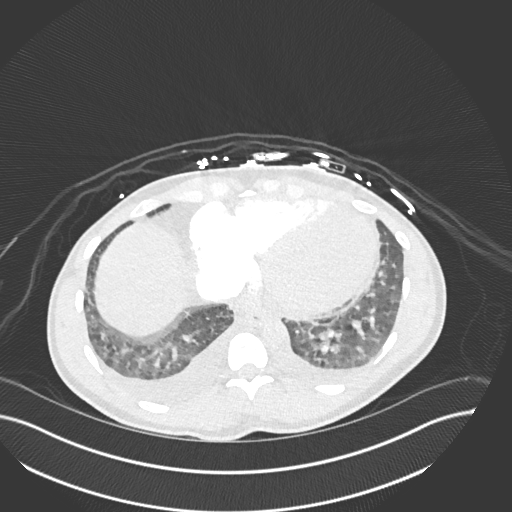
[im 147/420  mediastinal]
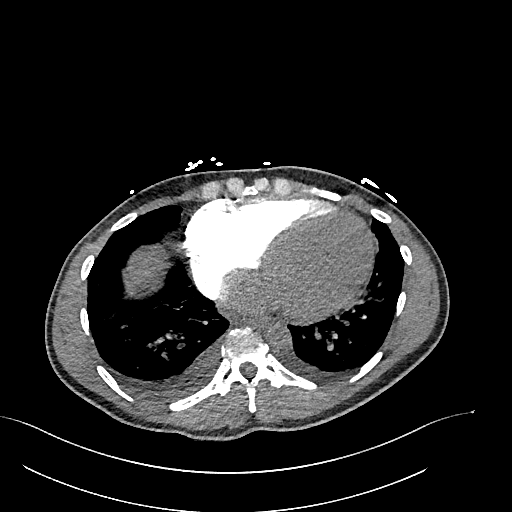
[im 168/420  lung]
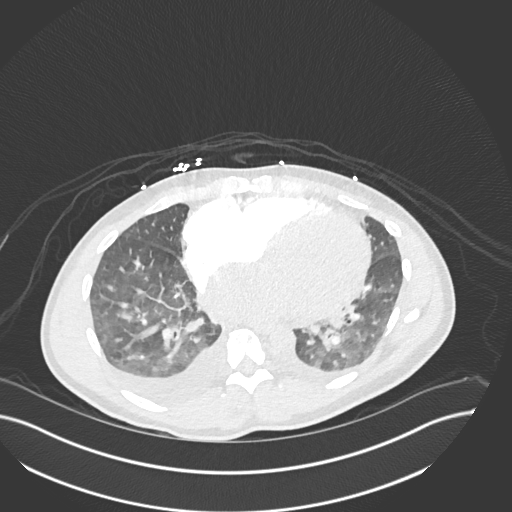
[im 189/420  mediastinal]
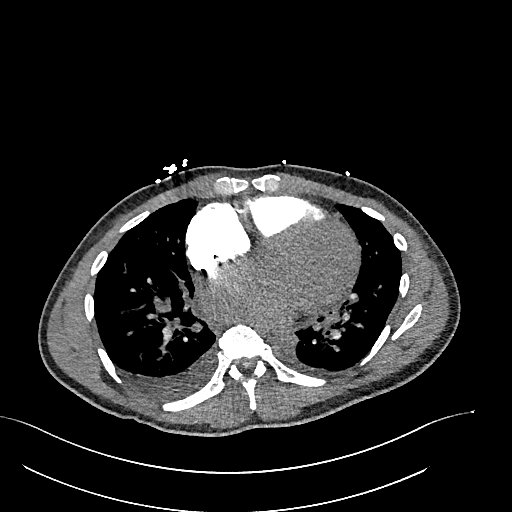
[im 231/420  lung]
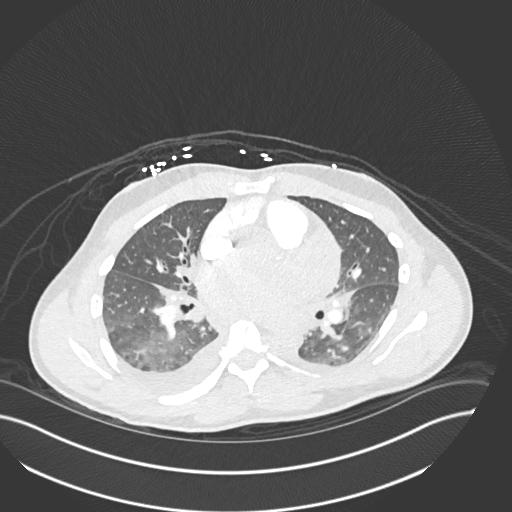
[im 252/420  mediastinal]
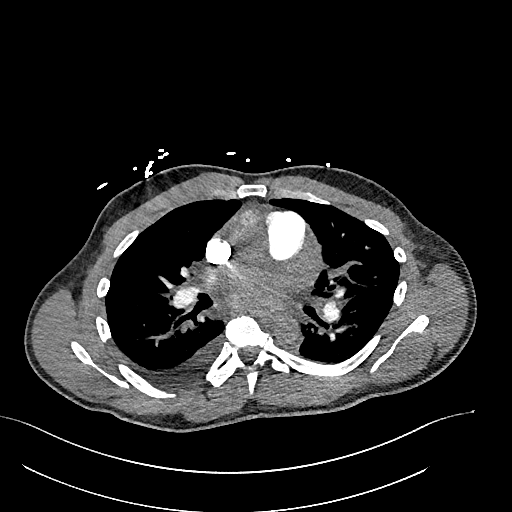
[im 273/420  lung]
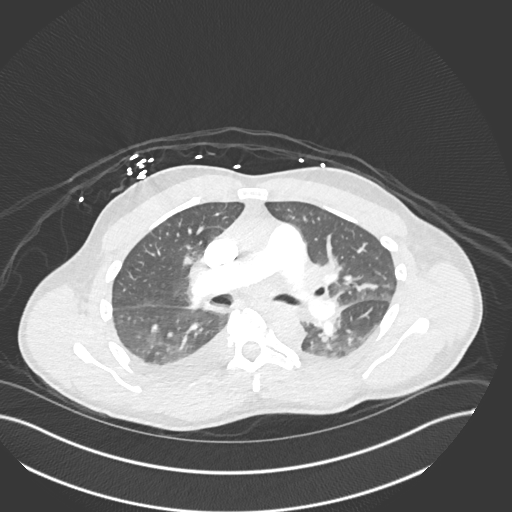
[im 294/420  mediastinal]
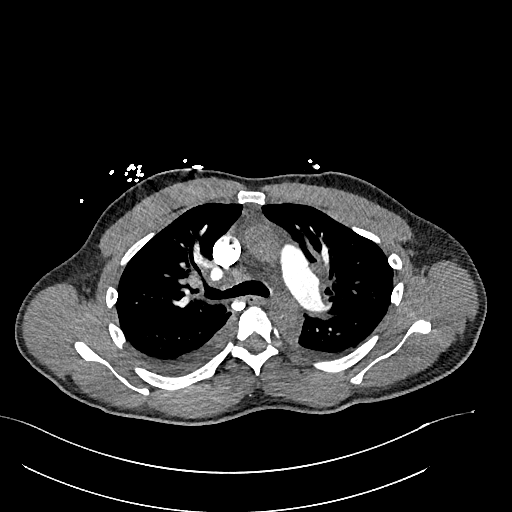
[im 315/420  lung]
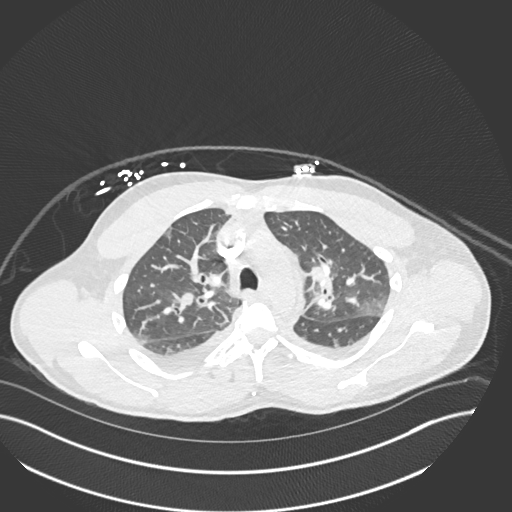
[im 357/420  mediastinal]
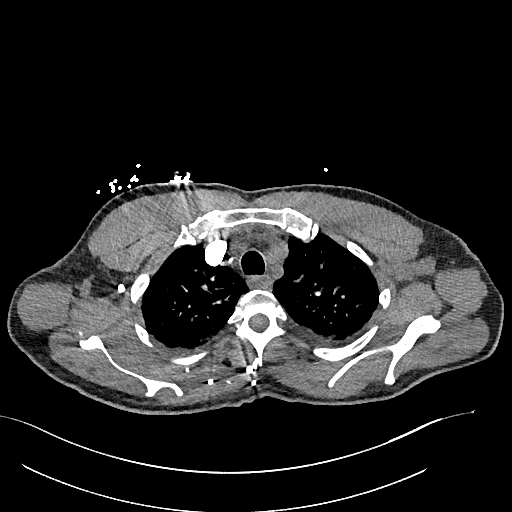
[im 378/420  lung]
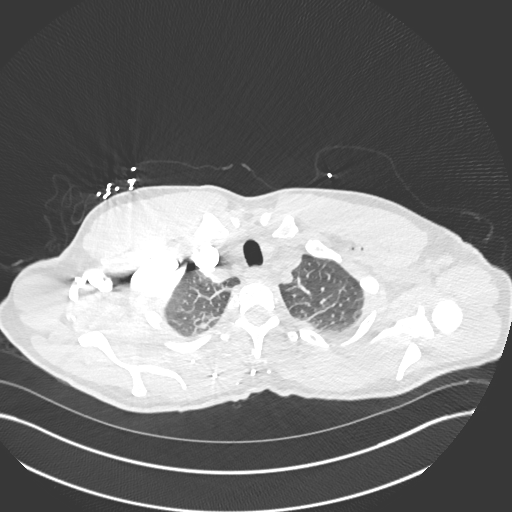
[im 399/420  mediastinal]
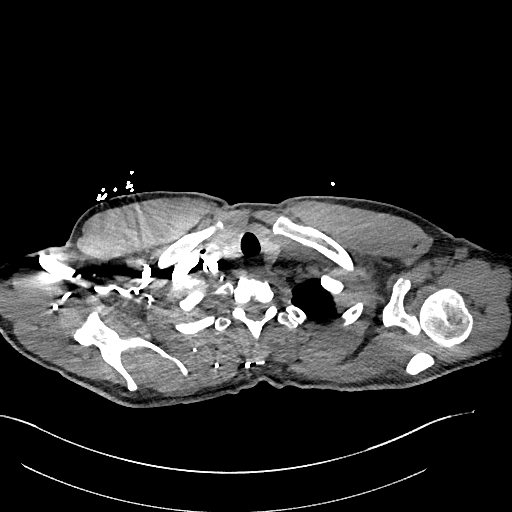

[Series 8: pe 2mm cor · coronal · 0.59mm/px · 1 of 118 slices shown]
[im 59/118  mediastinal]
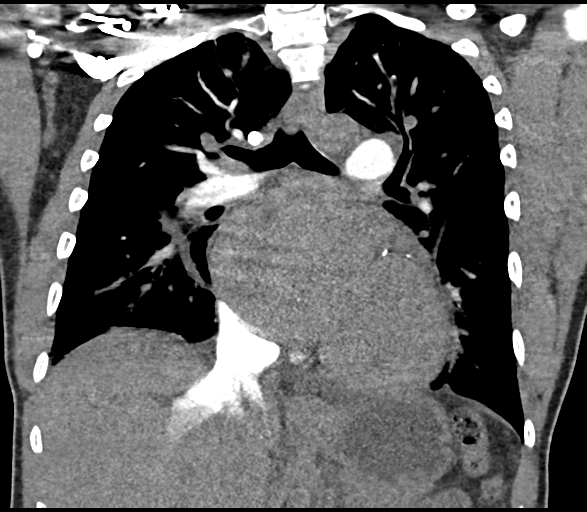

[17 of 36 positions shown; findings below may reference images not displayed]

FINDINGS: Cardiovascular: Satisfactory opacification the pulmonary arteries
however evaluation is limited due to respiratory motion artifact
which may limit detection of smaller segmental and subsegmental
pulmonary artery embolized. Some artifactual hypoattenuation is seen
within the posterior segmental arteries of both lower lobes given
the symmetric appearance on coronal met. No convincing central or
lobar pulmonary artery filling defects are identified. Central
pulmonary arteries are normal caliber. There is mild cardiomegaly
with biatrial enlargement. Reflux of contrast is seen into the
hepatic veins and IVC. Coronary artery calcifications are noted. No
pericardial effusion. Suboptimal opacification of the aorta for
luminal evaluation. Atherosclerotic plaque within the normal caliber
aorta. Shared origin of brachiocephalic and left common carotid
artery. Minimal plaque in the proximal great vessels.

Mediastinum/Nodes: No mediastinal fluid or gas. Normal thyroid gland
and thoracic inlet. No acute abnormality of the trachea or
esophagus. No worrisome mediastinal, hilar or axillary adenopathy.
Few low-attenuation, subcentimeter nodes may be reactive or
edematous.

Lungs/Pleura: Small moderate bilateral pleural effusions, right
greater than left. Some adjacent atelectatic changes. Extensive
interlobular septal thickening and fissural thickening throughout
the lungs with pulmonary vascular redistribution and central
pulmonary venous congestion with peribronchovascular thickening.
More hazy ground-glass opacities are present in the dependent
portions of the lungs and predominately within the lower lobes,
which could reflect cardiogenic edema though atypical infection
could have a similar appearance. No focal consolidative opacity. No
pneumothorax.

Upper Abdomen: No acute abnormalities present in the visualized
portions of the upper abdomen.

Musculoskeletal: Multilevel degenerative changes are present in the
imaged portions of the spine. No acute osseous abnormality or
suspicious osseous lesion. No worrisome chest wall lesions.

Review of the MIP images confirms the above findings.
IMPRESSION: 1. Satisfactory opacification the pulmonary arteries however
evaluation is limited due to respiratory motion artifact which may
limit detection of smaller segmental and subsegmental pulmonary
artery embolized. No convincing central or lobar pulmonary artery
filling defects are identified to suggest pulmonary embolism.
2. Cardiomegaly with biatrial enlargement. Reflux of contrast into
the hepatic veins and IVC may suggest elevated right heart
pressures.
3. Small moderate bilateral pleural effusions, right greater than
left, with adjacent atelectatic changes.
4. Septal thickening and vascular congestion with hazy ground-glass
opacities in the dependent portions of the lungs, predominately
within the lower lobes, favored to wreck deflect features of
cardiogenic pulmonary edema.
5. Aortic Atherosclerosis ([id number]).

## 2021-11-30 IMAGING — DX DG CHEST 1V PORT
1 series · 1 of 1 positions shown · non-contrast
Comparison: Chest x-ray 03/04/2020.

CLINICAL DATA: 58-year-old male with history of abnormal
respiration rate. History of congestive heart failure.

EXAM:
PORTABLE CHEST 1 VIEW

[chest]
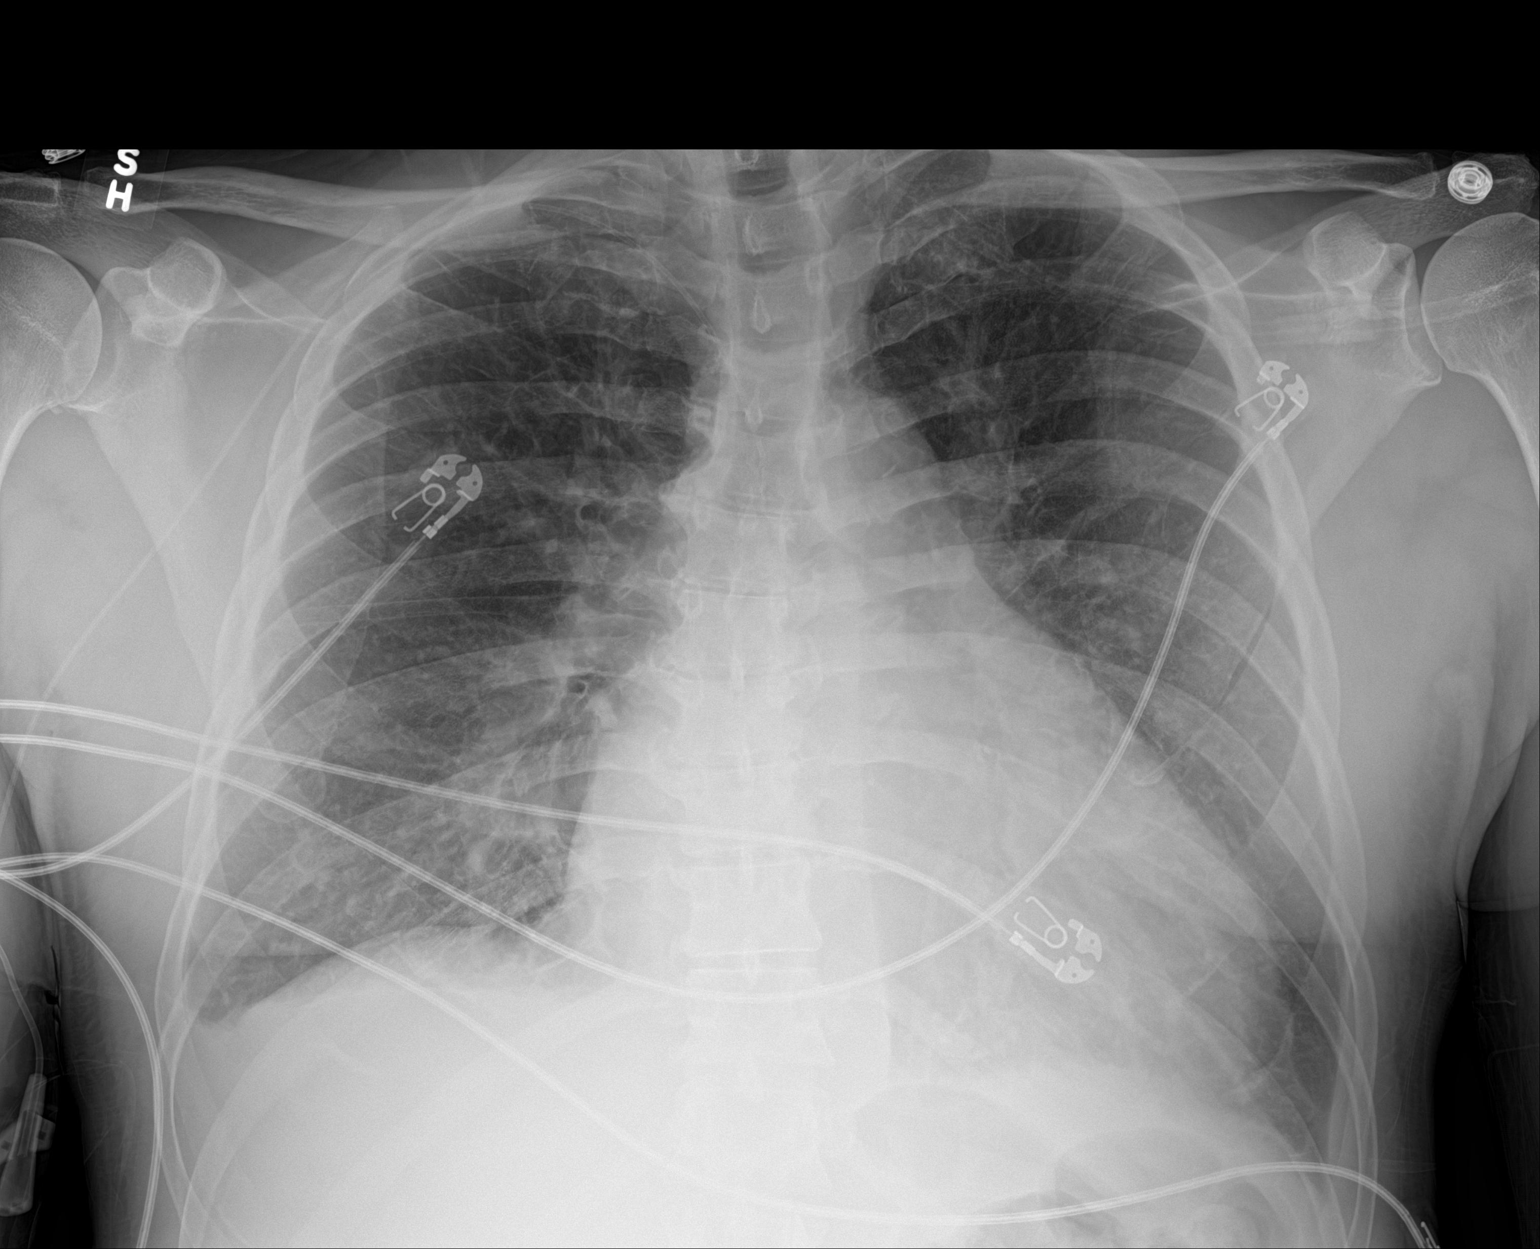

[1 of 1 positions shown; findings below may reference images not displayed]

FINDINGS: There is cephalization of the pulmonary vasculature and slight
indistinctness of the interstitial markings suggestive of mild
pulmonary edema. Trace bilateral pleural effusions. No pneumothorax.
No definite suspicious appearing pulmonary nodules or masses are
noted. Mild cardiomegaly. Upper mediastinal contours are within
normal limits.
IMPRESSION: 1. The appearance of the chest suggest mild congestive heart
failure, as above.

## 2021-12-02 ENCOUNTER — Inpatient Hospital Stay (HOSPITAL_COMMUNITY): Admission: RE | Admit: 2021-12-02 | Payer: Self-pay | Source: Ambulatory Visit

## 2021-12-03 ENCOUNTER — Other Ambulatory Visit (HOSPITAL_COMMUNITY): Payer: Self-pay

## 2021-12-10 ENCOUNTER — Encounter (HOSPITAL_COMMUNITY): Payer: Self-pay | Admitting: Pharmacy Technician

## 2021-12-10 NOTE — Telephone Encounter (Signed)
Advanced Heart Failure Patient Advocate Encounter  ? ?Patient was approved to receive Entresto from [employer] ? ?Effective dates: 12/10/21 through 12/11/22 ? ?Sent approval via mychart. Document scanned to chart.  ? ?Archer Asa, CPhT ? ?

## 2021-12-14 ENCOUNTER — Other Ambulatory Visit (HOSPITAL_COMMUNITY): Payer: Self-pay | Admitting: *Deleted

## 2021-12-14 MED ORDER — ENTRESTO 97-103 MG PO TABS: 1.0000 | ORAL_TABLET | Freq: Two times a day (BID) | ORAL | 3 refills | Status: DC

## 2021-12-14 NOTE — Telephone Encounter (Addendum)
Advanced Heart Failure Patient Advocate Encounter ? ?Spoke with patient via mychart. Sent Jasmine (CMA) a request to update the patient's med list with Entresto 97-103mg . ? ?Archer Asa, CPhT ? ?

## 2021-12-17 ENCOUNTER — Encounter (HOSPITAL_COMMUNITY): Payer: Self-pay | Admitting: Internal Medicine

## 2021-12-18 ENCOUNTER — Other Ambulatory Visit (HOSPITAL_COMMUNITY): Payer: Self-pay

## 2021-12-18 NOTE — Progress Notes (Incomplete)
***In Progress*** ? ?  ?Advanced Heart Failure Clinic Note  ? ? ?PCP: Dr. Ruben Gottron ?HF MD: Dr Haroldine Laws  ? ?HPI:  ? ?Zachary Arias is a 61 year old male with history of HF due to mixed I/NICM (diagnosed 05/2009) with EF 25-30% range  and 1-v CAD with chronically occluded RCA.  ? ?MRI in 09/2009 showed EF 31% with inferior scar.  ?  ?Brother had heart transplant in [place] several months ago. Dad also had previous heart transplant.  ?  ?He has been lost to f/u since 2015. Admitted 03/05/2020 with cardiogenic shock after stopping meds for months and using supplements. SBP in 70-80s. ECHO showed EF 20-25% and normal RV with severe mitral regurgitation. He has frequent PVCs requiring amiodarone. ?  ?R/LHC  on 03/10/2020 showed stable CAD (chronically occluded distal RCA and moderate non-obstructive CAD elsewhere), low filling pressures and normal cardiac output on milrinone 0.25 which was weaned off and he was started back on GDMT.  ?  ?Repeat echo 01/05/2021 showed EF 40-45% mild to mod MR and RV mild HK. Patient was lost to f/u for several months. Last OV was 12/2020.  ?  ?Patient was last seen by APP on 11/19/2021. He reported being off all of his meds for 3 months except ASA. He stated he had difficulty affording but did not reach out to [place] to communicate this. He reported SOB w/ moderate activity but denied resting dyspnea. Denied orthopnea/PND. Complained of recent palpitations and occasional chest tightness lasting < 5 min in duration. EKG showed NSR 98 bpm, 2 PVCs with no ischemic ST changes. BP in clinic that visit was 130/ 90. ?  ?11/19/2021 - ReDs Clip 34% ?  ?Reviewed prior documentation from pharmacy team regarding Entresto. Application for financial hardship assistance incomplete as pt has failed to summit required documentation for approval. Lives in [place], nonsmoker. He has been disabled since May. He needs assistance with transportation. Lives alone. He uses personal shoppers groceries.  ? ?Today he returns to HF clinic  for pharmacist medication titration. At last visit with APP ***. (Started on losartan per 2/23 note from [place] - now on Entresto? Confirm at this visit  with pharm clinic AND lasix was scheduled to prn ) ? ?Overall feeling ***. ?Dizziness, lightheadedness, fatigue:  ?Chest pain or palpitations: ? ?How is your breathing?: *** ?SOB: ?Able to complete all ADLs. Activity level *** ? ?Weight at home pounds. Takes furosemide/torsemide/bumex *** mg *** daily.  ?LEE ?PND/Orthopnea ? ?Appetite *** ?Low-salt diet:  ? ?Physical Exam ?Cost/affordability of meds ? ? ?HF Medications: ?Entresto 97-103 mgBID ?Farxiga 10 mg daily ?Furosemide 20 mg prn ? ?Has the patient been experiencing any side effects to the medications prescribed?  {YES NO:22349} ? ?Does the patient have any problems obtaining medications due to transportation or finances?   {YES NO:22349} ? ?Understanding of regimen: {excellent/good/fair/poor:19665} ?Understanding of indications: {excellent/good/fair/poor:19665} ?Potential of compliance: {excellent/good/fair/poor:19665} ?Patient understands to avoid NSAIDs. ?Patient understands to avoid decongestants. ?  ? ?Pertinent Lab Values: ?Serum creatinine 1.17, BUN 12, Potassium 3.8, Sodium 140, BNP 588 ? ?Vital Signs: ?Weight: *** (last clinic weight: 177.4 lbs.) ?Blood pressure: ***  ?Heart rate: ***  ? ?Assessment/Plan: ?1. Chronic systolic CHF  ?- Cath with 1-v CAD with chronically occluded RCA. MRI in January 2011 with EF 31% with inferior scar. Suspect mixed ischemic/nonischmic cardiomyopathy with familial component.  EF 45-50% on echo in 04/2012. No f/u between to 2015-2021. Echo 02/2020 EF 20-25% severe central MR. Repeat cath 02/2020 with stable CAD.  Echo 07/07/20 EF 30-35%. Echo 01/05/2021 EF 40-45% mild to mod MR RV mild HK. Unfortunately lost to f/u again and has been off all HF meds/diuretics x 3 months  ?- NYHA II. Volume status ok. ReDs clip 34% . BP mild-mod elevated ?- Continue furosemide 20 mg prn. ?-  *** Start carvedilol 6.25 mg BID. ?- Continue Entresto 97-103 mg BID.  ?- Start spironolactone 12.5 mg daily. ?- Continue Farxiga 10 mg daily. *** affordability concerns? ?- Discussed importance of strict med compliance and importance of notifying HF team if out of meds ?  ?2. CAD:  ?- Occluded dRCA on cath ?- Rare angina ?- Continue asa 81 mg daily.  ?- Continue atorvastatin 40 mg  ?  ?3. PVCs ?- Amiodarone was stopped 08/2020  ?- EKG today w/ 2 PVCs, he complains of palpitations ?- Ordered 2 wk Zio, if high burden will need to restart amio vs mexiletine for suppression.  ? ?  ?4. CKD 3a ?- check BMP today  ?- Continue [place]. ?  ? ?Follow up *** - original re-schedule - maybe can see him in 2 weeks to add spiro and 10 days ? ? ?Audry Riles, PharmD, BCPS, BCCP, CPP ?Heart Failure Clinic Pharmacist ?[phone] ? ? ?

## 2021-12-21 ENCOUNTER — Other Ambulatory Visit: Payer: Self-pay

## 2021-12-21 ENCOUNTER — Telehealth (HOSPITAL_COMMUNITY): Payer: Self-pay | Admitting: Licensed Clinical Social Worker

## 2021-12-21 ENCOUNTER — Ambulatory Visit (HOSPITAL_COMMUNITY)
Admission: RE | Admit: 2021-12-21 | Discharge: 2021-12-21 | Disposition: A | Discharge status: Home / Self Care | Payer: Medicaid Other | Source: Ambulatory Visit | Attending: Cardiology | Admitting: Cardiology

## 2021-12-21 ENCOUNTER — Other Ambulatory Visit (HOSPITAL_COMMUNITY): Payer: Self-pay

## 2021-12-21 ENCOUNTER — Telehealth (HOSPITAL_COMMUNITY): Payer: Self-pay | Admitting: *Deleted

## 2021-12-21 VITALS — BP 128/72 | HR 105 | Wt 176.6 lb

## 2021-12-21 DIAGNOSIS — I255 Ischemic cardiomyopathy: Secondary | ICD-10-CM | POA: Insufficient documentation

## 2021-12-21 DIAGNOSIS — I428 Other cardiomyopathies: Secondary | ICD-10-CM | POA: Insufficient documentation

## 2021-12-21 DIAGNOSIS — I472 Ventricular tachycardia, unspecified: Secondary | ICD-10-CM

## 2021-12-21 DIAGNOSIS — N1831 Chronic kidney disease, stage 3a: Secondary | ICD-10-CM | POA: Insufficient documentation

## 2021-12-21 DIAGNOSIS — I5022 Chronic systolic (congestive) heart failure: Secondary | ICD-10-CM | POA: Insufficient documentation

## 2021-12-21 DIAGNOSIS — R002 Palpitations: Secondary | ICD-10-CM | POA: Diagnosis not present

## 2021-12-21 DIAGNOSIS — I493 Ventricular premature depolarization: Secondary | ICD-10-CM | POA: Diagnosis not present

## 2021-12-21 DIAGNOSIS — Z7982 Long term (current) use of aspirin: Secondary | ICD-10-CM | POA: Diagnosis not present

## 2021-12-21 DIAGNOSIS — I25118 Atherosclerotic heart disease of native coronary artery with other forms of angina pectoris: Secondary | ICD-10-CM | POA: Insufficient documentation

## 2021-12-21 DIAGNOSIS — Z8249 Family history of ischemic heart disease and other diseases of the circulatory system: Secondary | ICD-10-CM | POA: Insufficient documentation

## 2021-12-21 DIAGNOSIS — I2582 Chronic total occlusion of coronary artery: Secondary | ICD-10-CM | POA: Insufficient documentation

## 2021-12-21 DIAGNOSIS — Z7984 Long term (current) use of oral hypoglycemic drugs: Secondary | ICD-10-CM | POA: Insufficient documentation

## 2021-12-21 DIAGNOSIS — Z79899 Other long term (current) drug therapy: Secondary | ICD-10-CM | POA: Insufficient documentation

## 2021-12-21 LAB — BRAIN NATRIURETIC PEPTIDE: B Natriuretic Peptide: 887.4 pg/mL — ABNORMAL HIGH (ref 0.0–100.0)

## 2021-12-21 LAB — BASIC METABOLIC PANEL
Anion gap: 5 (ref 5–15)
BUN: 11 mg/dL (ref 6–20)
CO2: 26 mmol/L (ref 22–32)
Calcium: 8.8 mg/dL — ABNORMAL LOW (ref 8.9–10.3)
Chloride: 111 mmol/L (ref 98–111)
Creatinine, Ser: 1.43 mg/dL — ABNORMAL HIGH (ref 0.61–1.24)
GFR, Estimated: 56 mL/min — ABNORMAL LOW (ref >60)
Glucose, Bld: 112 mg/dL — ABNORMAL HIGH (ref 70–99)
Potassium: 3.8 mmol/L (ref 3.5–5.1)
Sodium: 142 mmol/L (ref 135–145)

## 2021-12-21 MED ORDER — AMIODARONE HCL 200 MG PO TABS
200.0000 mg | ORAL_TABLET | Freq: Two times a day (BID) | ORAL | 3 refills | Status: DC
  Filled 2021-12-21: qty 60, 30d supply, fill #0
  Filled 2021-12-31 – 2022-02-05 (×2): qty 60, 30d supply, fill #1
  Filled 2022-02-26: qty 60, 30d supply, fill #2

## 2021-12-21 NOTE — Progress Notes (Signed)
?  ?Advanced Heart Failure Clinic Note  ? ? ?PCP: [name] ?HF MD: [name]  ? ?HPI: Zachary Arias is a 61 year old male with history of HF due to mixed ICM/NICM (diagnosed 05/2009) with EF 25-30% range  and 1-v CAD with chronically occluded RCA. Brother had heart transplant in [place] several months ago. Dad also had previous heart transplant.  ? ?MRI in 09/2009 showed EF 31% with inferior scar.  ?  ?He had been lost to f/u since 2015. Admitted 03/05/2020 with cardiogenic shock after stopping meds for months and using supplements. SBP in 70-80s. ECHO showed EF 20-25% and normal RV with severe mitral regurgitation. He had frequent PVCs requiring amiodarone. ?  ?R/LHC on 03/10/2020 showed stable CAD (chronically occluded distal RCA and moderate non-obstructive CAD elsewhere) with low filling pressures and normal cardiac output on milrinone 0.25 which was weaned off. He was started back on GDMT.  ?  ?Repeat echo on 01/05/2021 showed EF 40-45% with mild to mod MR and RV with mild HK. Patient was lost to f/u for several months.  ?  ?Patient was last seen by APP on 11/19/2021. He reported being off all of his meds for 3 months except aspirin. He stated he had difficulty with affordability but did not reach out to AHF to communicate this. At this visit, he reported SOB w/ moderate activity but denied resting dyspnea. Denied orthopnea/PND. Complained of recent palpitations and occasional chest tightness lasting < 5 min in duration. EKG showed NSR 98 bpm and 2 PVCs with no ischemic ST changes. BP in clinic was 130/90 with ReDs Clip 34%. ?  ?Zio patch 10/2021 showed 1164 runs of nonsustained ventricular tachycardia, the run with the fastest interval lasting 19 beats with a max rate of 255 bpm (avg 191 bpm). Frequent PVCs (13.1%, [id number]),  Ventricular Couplets were occasional (3.9%, 37189), and VE Triplets were occasional (1.1%, 6941). It was noted by Dr. Gala Romney that he should start amiodarone 200 mg BID and be referred to EP.  Unfortunately, he did not return any of the calls from clinic and this change was not made. ? ?Today, he returns to HF clinic for pharmacist medication titration. At last visit with APP, losartan 25 mg daily was initiated. It was noted that he should be changed back to Entresto 97/103 mg BID (his previous dose) once his PAP was approved from the manufacturer. He was approved on 12/10/21 for [name] for [name] and was able to start Entresto 97/103 mg BID 3 days ago. He appropriately discontinued the losartan after starting the Entresto. He reports feeling overall improved since starting Entresto. He denies dizziness, lightheadedness and fatigue. Denies chest pain. Endorses palpitations that occur throughout the day. Overall, he reports his breathing has improved significantly. He reports he used to get SOB with exertion but denies any recent SOB. Able to complete ADLs. He does not weigh himself at home (does not have a scale - provided a scale today). Takes furosemide 20 mg PRN. He reports occassional BID usage a few times a week, but has been using daily over the last week for swelling in his lower legs. He reports cutting back his fluid intake. Denies PND/orthopnea. Appetite ok. He has been disabled since May. He needs assistance with transportation.  ? ?HF Medications: ?Entresto 97-103 mg BID ?Farxiga 10 mg daily ?Furosemide 20 mg PRN ? ?Has the patient been experiencing any side effects to the medications prescribed?  No ? ?Does the patient have any problems obtaining medications due  to transportation or finances?  No insurance. [name] though [name]. All other heart failure medications though HF fund at [facility].   ? ?Understanding of regimen: good ?Understanding of indications: good ?Potential of compliance: fair ?Patient understands to avoid NSAIDs. ?Patient understands to avoid decongestants. ?  ? ?Pertinent Lab Values: ?12/21/2021 - Serum creatinine 1.43, BUN 11, Potassium 3.8, Sodium 142,  BNP 887 ? ?Vital Signs: ?Weight: 176 lbs (last clinic weight: 177.4 lbs.) ?Blood pressure: 128/72  ?Heart rate: 105  ? ?Assessment/Plan: ?1. Chronic systolic CHF  ?- Cath with 1-v CAD with chronically occluded RCA. MRI in January 2011 with EF 31% with inferior scar. Suspect mixed ischemic/nonischemic cardiomyopathy with familial component.  EF 45-50% on echo in 04/2012. No f/u between 2015-2021. Echo 02/2020 EF 20-25% severe central MR. Repeat cath 02/2020 with stable CAD. Echo 07/07/2020 EF 30-35%. Echo 01/05/2021 EF 40-45% mild to mod MR RV mild HK. ?- NYHA II. Volume status elevated, 1+ bilateral LEE.  ?- Take furosemide 40 mg daily x 3 days and then continue furosemide 20 mg PRN. ?- Continue Entresto 97-103 mg BID.  ?- Continue Farxiga 10 mg daily.  ?  ?2. CAD:  ?- Occluded dRCA on cath. ?- Rare angina ?- Continue aspirin 81 mg daily.  ?- Continue atorvastatin 40 mg daily. ?  ?3. PVCs ?- Amiodarone was stopped 08/2020  ?- EKG on 11/23/2021 w/ 2 PVCs with palpitations. Zio placed 10/2021 showed 1164 runs of nonsustained ventricular tachycardia, fastest interval lasting 19 beats with a max rate of 255 bpm (avg 191 bpm). Frequent PVCs (13.1%) were also seen.  ?- Start amiodarone 200 mg BID. Referral sent to EP per [name] note from 12/10/21.  ? ?4. CKD 3a ?- BMET stable today. ?- Continue [name]. ?  ? ?Follow up in 4 weeks with APP clinic. ? ? ?[name], PharmD, BCPS, BCCP, CPP ?Heart Failure Clinic Pharmacist ?[phone] ? ? ?

## 2021-12-21 NOTE — Telephone Encounter (Signed)
Patient called requesting transportation. Patient states he has used [employer] in the past and was informed that no longer available. CSW completed assessment and made arrangements for taxi transport. Lasandra Beech, LCSW, CCSW-MCS [phone] ? ?

## 2021-12-21 NOTE — Telephone Encounter (Signed)
Patch Wear Time:  13 days and 11 hours (2023-02-23T14:58:30-0500 to 2023-03-09T02:08:24-0500) ?  ?1. Sinus rhythm - avg HR of 99 bpm. ?2. 1164 runs of nonsustained Ventricular Tachycardia occurred, the run with the fastest interval lasting 19 beats with a max rate of 255 bpm (avg 191 bpm); the run with the fastest interval was also the longest. Ventricular Tachycardia was detected within +/- 45 seconds of symptomatic patient event(s). ?3. Frequent PVCs (13.1%, [id number]),  Ventricular Couplets were occasional (3.9%, 37189), and VE Triplets were occasional (1.1%, 6941). - there were 3 major morphologies for PVCs (all around 4% prevalence) ?4. Ventricular Bigeminy and Trigeminy were present. ?5. Some VT runs may be SVT with abberancy ?  ?Arvilla Meres, MD  ?9:59 PM ? ?[name], MD  ?12/12/2021 10:07 PM EDT   ?  ?1. Start amio 200 bid ?2. Let's get cardiac MRI (if he has insurance to  look for sarcoid) ?3. Refer to EP   ? ? ?Have left multiple messages for pt to call office to discuss results, with no response. ? ?Pt sch for pharm D appt today, Amio sent in, ref placed, unable to get cMRI with no insurance ?

## 2021-12-21 NOTE — Patient Instructions (Signed)
It was a pleasure seeing you today! ? ?MEDICATIONS: ?-We are changing your medications today ?-Start amiodarone 200 mg (1 tablet) twice daily ?-Take furosemide 40 mg daily x 3 days then back to 20 mg as needed for fluid/edema ?-Call if you have questions about your medications. ? ?LABS: ?-We will call you if your labs need attention. ? ?NEXT APPOINTMENT: ?Return to clinic in 01/22/2022 with  NP/PA. ? ?In general, to take care of your heart failure: ?-Limit your fluid intake to 2 Liters (half-gallon) per day.   ?-Limit your salt intake to ideally 2-3 grams (2000-3000 mg) per day. ?-Weigh yourself daily and record, and bring that "weight diary" to your next appointment.  (Weight gain of 2-3 pounds in 1 day typically means fluid weight.) ?-The medications for your heart are to help your heart and help you live longer.   ?-Please contact us before stopping any of your heart medications. ? ?Call the clinic at [phone] with questions or to reschedule future appointments.  ?

## 2022-01-01 ENCOUNTER — Other Ambulatory Visit (HOSPITAL_COMMUNITY): Payer: Self-pay

## 2022-01-01 ENCOUNTER — Encounter (HOSPITAL_COMMUNITY): Payer: Self-pay | Admitting: *Deleted

## 2022-01-01 NOTE — Progress Notes (Signed)
Pt's LTD forms completed, signed by [name], and faxed along with OV note to [facility] ?

## 2022-01-17 ENCOUNTER — Telehealth: Payer: Self-pay | Admitting: Physician Assistant

## 2022-01-17 ENCOUNTER — Other Ambulatory Visit: Payer: Self-pay

## 2022-01-17 ENCOUNTER — Inpatient Hospital Stay (HOSPITAL_COMMUNITY): Payer: Medicaid Other

## 2022-01-17 ENCOUNTER — Encounter (HOSPITAL_COMMUNITY): Payer: Self-pay | Admitting: *Deleted

## 2022-01-17 ENCOUNTER — Inpatient Hospital Stay (HOSPITAL_COMMUNITY)
Admission: EM | Admit: 2022-01-17 | Discharge: 2022-02-04 | DRG: 291 | Disposition: A | Discharge status: Hospice | Payer: Medicaid Other | Attending: Internal Medicine | Admitting: Internal Medicine

## 2022-01-17 ENCOUNTER — Emergency Department (HOSPITAL_COMMUNITY): Payer: Medicaid Other

## 2022-01-17 DIAGNOSIS — R570 Cardiogenic shock: Secondary | ICD-10-CM | POA: Diagnosis present

## 2022-01-17 DIAGNOSIS — E870 Hyperosmolality and hypernatremia: Secondary | ICD-10-CM | POA: Diagnosis present

## 2022-01-17 DIAGNOSIS — I493 Ventricular premature depolarization: Secondary | ICD-10-CM | POA: Diagnosis present

## 2022-01-17 DIAGNOSIS — Z8249 Family history of ischemic heart disease and other diseases of the circulatory system: Secondary | ICD-10-CM | POA: Diagnosis not present

## 2022-01-17 DIAGNOSIS — M25571 Pain in right ankle and joints of right foot: Secondary | ICD-10-CM | POA: Diagnosis present

## 2022-01-17 DIAGNOSIS — N179 Acute kidney failure, unspecified: Secondary | ICD-10-CM | POA: Diagnosis present

## 2022-01-17 DIAGNOSIS — Z79899 Other long term (current) drug therapy: Secondary | ICD-10-CM

## 2022-01-17 DIAGNOSIS — E869 Volume depletion, unspecified: Secondary | ICD-10-CM | POA: Diagnosis present

## 2022-01-17 DIAGNOSIS — I255 Ischemic cardiomyopathy: Secondary | ICD-10-CM | POA: Diagnosis present

## 2022-01-17 DIAGNOSIS — Z597 Insufficient social insurance and welfare support: Secondary | ICD-10-CM | POA: Diagnosis not present

## 2022-01-17 DIAGNOSIS — E876 Hypokalemia: Secondary | ICD-10-CM | POA: Diagnosis present

## 2022-01-17 DIAGNOSIS — E1122 Type 2 diabetes mellitus with diabetic chronic kidney disease: Secondary | ICD-10-CM | POA: Diagnosis present

## 2022-01-17 DIAGNOSIS — I5023 Acute on chronic systolic (congestive) heart failure: Secondary | ICD-10-CM

## 2022-01-17 DIAGNOSIS — N189 Chronic kidney disease, unspecified: Secondary | ICD-10-CM | POA: Diagnosis present

## 2022-01-17 DIAGNOSIS — I5043 Acute on chronic combined systolic (congestive) and diastolic (congestive) heart failure: Secondary | ICD-10-CM | POA: Diagnosis present

## 2022-01-17 DIAGNOSIS — N1831 Chronic kidney disease, stage 3a: Secondary | ICD-10-CM | POA: Diagnosis present

## 2022-01-17 DIAGNOSIS — I509 Heart failure, unspecified: Principal | ICD-10-CM

## 2022-01-17 DIAGNOSIS — I2510 Atherosclerotic heart disease of native coronary artery without angina pectoris: Secondary | ICD-10-CM | POA: Diagnosis present

## 2022-01-17 DIAGNOSIS — I5084 End stage heart failure: Secondary | ICD-10-CM | POA: Diagnosis present

## 2022-01-17 DIAGNOSIS — I428 Other cardiomyopathies: Secondary | ICD-10-CM | POA: Diagnosis present

## 2022-01-17 DIAGNOSIS — I272 Pulmonary hypertension, unspecified: Secondary | ICD-10-CM | POA: Diagnosis present

## 2022-01-17 DIAGNOSIS — E785 Hyperlipidemia, unspecified: Secondary | ICD-10-CM | POA: Diagnosis present

## 2022-01-17 DIAGNOSIS — D72829 Elevated white blood cell count, unspecified: Secondary | ICD-10-CM | POA: Diagnosis present

## 2022-01-17 DIAGNOSIS — I3139 Other pericardial effusion (noninflammatory): Secondary | ICD-10-CM | POA: Diagnosis present

## 2022-01-17 DIAGNOSIS — Z7982 Long term (current) use of aspirin: Secondary | ICD-10-CM | POA: Diagnosis not present

## 2022-01-17 DIAGNOSIS — R0602 Shortness of breath: Secondary | ICD-10-CM | POA: Diagnosis present

## 2022-01-17 DIAGNOSIS — Z8041 Family history of malignant neoplasm of ovary: Secondary | ICD-10-CM

## 2022-01-17 DIAGNOSIS — N183 Chronic kidney disease, stage 3 unspecified: Secondary | ICD-10-CM | POA: Diagnosis present

## 2022-01-17 DIAGNOSIS — I130 Hypertensive heart and chronic kidney disease with heart failure and stage 1 through stage 4 chronic kidney disease, or unspecified chronic kidney disease: Secondary | ICD-10-CM | POA: Diagnosis present

## 2022-01-17 LAB — BASIC METABOLIC PANEL
Anion gap: 12 (ref 5–15)
BUN: 19 mg/dL (ref 6–20)
CO2: 21 mmol/L — ABNORMAL LOW (ref 22–32)
Calcium: 8.6 mg/dL — ABNORMAL LOW (ref 8.9–10.3)
Chloride: 110 mmol/L (ref 98–111)
Creatinine, Ser: 1.69 mg/dL — ABNORMAL HIGH (ref 0.61–1.24)
GFR, Estimated: 46 mL/min — ABNORMAL LOW (ref >60)
Glucose, Bld: 128 mg/dL — ABNORMAL HIGH (ref 70–99)
Potassium: 4.3 mmol/L (ref 3.5–5.1)
Sodium: 143 mmol/L (ref 135–145)

## 2022-01-17 LAB — CBG MONITORING, ED: Glucose-Capillary: 108 mg/dL — ABNORMAL HIGH (ref 70–99)

## 2022-01-17 LAB — CBC WITH DIFFERENTIAL/PLATELET
Abs Immature Granulocytes: 0.13 10*3/uL — ABNORMAL HIGH (ref 0.00–0.07)
Basophils Absolute: 0 10*3/uL (ref 0.0–0.1)
Basophils Relative: 0 %
Eosinophils Absolute: 0 10*3/uL (ref 0.0–0.5)
Eosinophils Relative: 0 %
HCT: 46.7 % (ref 39.0–52.0)
Hemoglobin: 14.8 g/dL (ref 13.0–17.0)
Immature Granulocytes: 1 %
Lymphocytes Relative: 5 %
Lymphs Abs: 1 10*3/uL (ref 0.7–4.0)
MCH: 28.5 pg (ref 26.0–34.0)
MCHC: 31.7 g/dL (ref 30.0–36.0)
MCV: 90 fL (ref 80.0–100.0)
Monocytes Absolute: 1 10*3/uL (ref 0.1–1.0)
Monocytes Relative: 5 %
Neutro Abs: 16.7 10*3/uL — ABNORMAL HIGH (ref 1.7–7.7)
Neutrophils Relative %: 89 %
Platelets: 262 10*3/uL (ref 150–400)
RBC: 5.19 MIL/uL (ref 4.22–5.81)
RDW: 16 % — ABNORMAL HIGH (ref 11.5–15.5)
WBC: 18.8 10*3/uL — ABNORMAL HIGH (ref 4.0–10.5)
nRBC: 0 % (ref 0.0–0.2)

## 2022-01-17 LAB — GLUCOSE, CAPILLARY: Glucose-Capillary: 196 mg/dL — ABNORMAL HIGH (ref 70–99)

## 2022-01-17 LAB — MAGNESIUM: Magnesium: 2.2 mg/dL (ref 1.7–2.4)

## 2022-01-17 LAB — BRAIN NATRIURETIC PEPTIDE: B Natriuretic Peptide: 2334.9 pg/mL — ABNORMAL HIGH (ref 0.0–100.0)

## 2022-01-17 LAB — MRSA NEXT GEN BY PCR, NASAL: MRSA by PCR Next Gen: NOT DETECTED

## 2022-01-17 LAB — TROPONIN I (HIGH SENSITIVITY): Troponin I (High Sensitivity): 15 ng/L (ref <18)

## 2022-01-17 MED ORDER — SODIUM CHLORIDE 0.9 % IV BOLUS
250.0000 mL | Freq: Once | INTRAVENOUS | Status: AC
  Administered 2022-01-17: 250 mL via INTRAVENOUS

## 2022-01-17 MED ORDER — FUROSEMIDE 10 MG/ML IJ SOLN
60.0000 mg | Freq: Every day | INTRAMUSCULAR | Status: DC
Start: 2022-01-18 — End: 2022-01-18
  Filled 2022-01-17: qty 6

## 2022-01-17 MED ORDER — ONDANSETRON HCL 4 MG/2ML IJ SOLN: 4.0000 mg | Freq: Four times a day (QID) | INTRAMUSCULAR | Status: DC | PRN

## 2022-01-17 MED ORDER — FUROSEMIDE 10 MG/ML IJ SOLN
60.0000 mg | Freq: Once | INTRAMUSCULAR | Status: AC
  Administered 2022-01-17: 60 mg via INTRAVENOUS
  Filled 2022-01-17: qty 6

## 2022-01-17 MED ORDER — ACETAMINOPHEN 325 MG PO TABS
650.0000 mg | ORAL_TABLET | ORAL | Status: DC | PRN
  Administered 2022-01-17 – 2022-01-18 (×2): 650 mg via ORAL
  Filled 2022-01-17 (×2): qty 2

## 2022-01-17 MED ORDER — SODIUM CHLORIDE 0.9% FLUSH: 3.0000 mL | INTRAVENOUS | Status: DC | PRN

## 2022-01-17 MED ORDER — SODIUM CHLORIDE 0.9 % IV SOLN: 250.0000 mL | INTRAVENOUS | Status: DC | PRN

## 2022-01-17 MED ORDER — HEPARIN SODIUM (PORCINE) 5000 UNIT/ML IJ SOLN
5000.0000 [IU] | Freq: Three times a day (TID) | INTRAMUSCULAR | Status: DC
  Administered 2022-01-17 – 2022-01-30 (×40): 5000 [IU] via SUBCUTANEOUS
  Filled 2022-01-17 (×48): qty 1

## 2022-01-17 MED ORDER — SODIUM CHLORIDE 0.9% FLUSH
3.0000 mL | Freq: Two times a day (BID) | INTRAVENOUS | Status: DC
  Administered 2022-01-17 – 2022-01-21 (×5): 3 mL via INTRAVENOUS

## 2022-01-17 MED ORDER — ATORVASTATIN CALCIUM 40 MG PO TABS
40.0000 mg | ORAL_TABLET | Freq: Every day | ORAL | Status: DC
  Administered 2022-01-17 – 2022-02-04 (×19): 40 mg via ORAL
  Filled 2022-01-17 (×19): qty 1

## 2022-01-17 MED ORDER — AMIODARONE HCL 200 MG PO TABS
200.0000 mg | ORAL_TABLET | Freq: Two times a day (BID) | ORAL | Status: DC
  Administered 2022-01-18 – 2022-02-04 (×36): 200 mg via ORAL
  Filled 2022-01-17 (×39): qty 1

## 2022-01-17 MED ORDER — INSULIN ASPART 100 UNIT/ML IJ SOLN
0.0000 [IU] | Freq: Three times a day (TID) | INTRAMUSCULAR | Status: DC
  Administered 2022-01-19: 3 [IU] via SUBCUTANEOUS
  Administered 2022-01-19: 1 [IU] via SUBCUTANEOUS

## 2022-01-17 MED ORDER — ASPIRIN 81 MG PO CHEW
81.0000 mg | CHEWABLE_TABLET | Freq: Every day | ORAL | Status: DC
  Administered 2022-01-17 – 2022-02-04 (×19): 81 mg via ORAL
  Filled 2022-01-17 (×19): qty 1

## 2022-01-17 NOTE — Telephone Encounter (Signed)
Paged by answering service.  Patient reported 7 to 10 days history of progressive worsening lower extremity edema, weight gain, orthopnea and shortness of breath.  He is taking Lasix 20 mg twice daily without improvement and also low urine output. it is hard for him to get around.  Reports low-sodium diet.  Advised to come to ER for further evaluation.  He is agree with plan. ?

## 2022-01-17 NOTE — ED Provider Notes (Signed)
?[facility] EMERGENCY DEPARTMENT ?Provider Note ? ? ?CSN: [id number] ?Arrival date & time: 01/17/22  1342 ? ?  ? ?History ? ?Chief Complaint  ?Patient presents with  ? Leg Swelling  ? ? ?Zachary Arias is a 61 y.o. male with medical history significant for CHF, currently being seen by Dr. Gala Romney.  Patient comes in to ED for evaluation of shortness of breath and leg swelling.  Patient states that for the last 5 days he had increased shortness of breath as well as lower extremity swelling bilaterally.  Patient reports that he stopped taking his Lasix 5 days ago due to the fact that he did not believe it was helping him. Patient states since this time his bilateral lower extremities have began to swell and he is having orthopnea at night when he attempts to sleep. Patient also endorsing weight gain. Patient denies any recent fevers, nausea, vomiting, diarrhea, abdominal pain, lightheadedness.  ? ?HPI ? ?  ? ?Home Medications ?Prior to Admission medications   ?Medication Sig Start Date End Date Taking? Authorizing Provider  ?amiodarone (PACERONE) 200 MG tablet Take 1 tablet (200 mg total) by mouth 2 (two) times daily. 12/21/21   Bensimhon, Bevelyn Buckles, MD  ?ASPIRIN 81 PO Take 81 mg by mouth daily.    [provider]  ?atorvastatin (LIPITOR) 40 MG tablet Take 1 tablet (40 mg total) by mouth daily. 11/25/21   Allayne Butcher, PA-C  ?dapagliflozin propanediol (FARXIGA) 10 MG TABS tablet Take 1 tablet (10 mg total) by mouth daily before breakfast. 11/19/21   Robbie Lis M, PA-C  ?furosemide (LASIX) 20 MG tablet Take 1 tablet (20 mg total) by mouth as needed. 11/19/21   Robbie Lis M, PA-C  ?sacubitril-valsartan (ENTRESTO) 97-103 MG Take 1 tablet by mouth 2 (two) times daily. 12/14/21   Bensimhon, Bevelyn Buckles, MD  ?   ? ?Allergies    ?Avocado and Shellfish allergy   ? ?Review of Systems   ?Review of Systems  ?Respiratory:  Positive for shortness of breath.   ?Cardiovascular:  Positive for  leg swelling.  ?All other systems reviewed and are negative. ? ?Physical Exam ?Updated Vital Signs ?BP (!) 125/91 (BP Location: Right Arm)   Pulse (!) 39   Temp 97.9 ?F (36.6 ?C) (Oral)   Resp 16   Ht 5' 7.5" (1.715 m)   Wt 83.4 kg   SpO2 97%   BMI 28.36 kg/m?  ?Physical Exam ?Vitals and nursing note reviewed.  ?Constitutional:   ?   General: He is not in acute distress. ?   Appearance: Normal appearance. He is not ill-appearing, toxic-appearing or diaphoretic.  ?HENT:  ?   Head: Normocephalic and atraumatic.  ?   Nose: Nose normal. No congestion.  ?   Mouth/Throat:  ?   Mouth: Mucous membranes are moist.  ?   Pharynx: Oropharynx is clear.  ?Eyes:  ?   Extraocular Movements: Extraocular movements intact.  ?   Conjunctiva/sclera: Conjunctivae normal.  ?   Pupils: Pupils are equal, round, and reactive to light.  ?Cardiovascular:  ?   Rate and Rhythm: Normal rate and regular rhythm.  ?Pulmonary:  ?   Effort: Pulmonary effort is normal.  ?   Breath sounds: Rales present.  ?   Comments: Diffuse rales and crackles ?Abdominal:  ?   General: Abdomen is flat. Bowel sounds are normal.  ?   Palpations: Abdomen is soft.  ?   Tenderness: There is no abdominal tenderness.  ?Musculoskeletal:  ?  Cervical back: Normal range of motion and neck supple. No tenderness.  ?   Right lower leg: 3+ Pitting Edema present.  ?   Left lower leg: 3+ Pitting Edema present.  ?Skin: ?   General: Skin is warm and dry.  ?   Capillary Refill: Capillary refill takes less than 2 seconds.  ?Neurological:  ?   Mental Status: He is alert and oriented to person, place, and time.  ? ? ?ED Results / Procedures / Treatments   ?Labs ?(all labs ordered are listed, but only abnormal results are displayed) ?Labs Reviewed  ?BASIC METABOLIC PANEL - Abnormal; Notable for the following components:  ?    Result Value  ? CO2 21 (*)   ? Glucose, Bld 128 (*)   ? Creatinine, Ser 1.69 (*)   ? Calcium 8.6 (*)   ? GFR, Estimated 46 (*)   ? All other components within  normal limits  ?CBC WITH DIFFERENTIAL/PLATELET - Abnormal; Notable for the following components:  ? WBC 18.8 (*)   ? RDW 16.0 (*)   ? Neutro Abs 16.7 (*)   ? Abs Immature Granulocytes 0.13 (*)   ? All other components within normal limits  ?BRAIN NATRIURETIC PEPTIDE - Abnormal; Notable for the following components:  ? B Natriuretic Peptide 2,334.9 (*)   ? All other components within normal limits  ?HIV ANTIBODY (ROUTINE TESTING W REFLEX)  ?BASIC METABOLIC PANEL  ?CBC WITH DIFFERENTIAL/PLATELET  ?HEMOGLOBIN A1C  ?TROPONIN I (HIGH SENSITIVITY)  ? ? ?EKG ?EKG Interpretation ? ?Date/Time:  Sunday January 17 2022 13:48:27 EDT ?Ventricular Rate:  102 ?PR Interval:  186 ?QRS Duration: 106 ?QT Interval:  372 ?QTC Calculation: 485 ?R Axis:   75 ?Text Interpretation: Sinus tachycardia Borderline prolonged QT interval Nonspecific ST abnormality Confirmed by Cathren Laine ([id number]) on 01/17/2022 1:58:53 PM ? ?Radiology ?DG Chest 2 View ? ?Result Date: 01/17/2022 ?CLINICAL DATA:  Shortness of breath EXAM: CHEST - 2 VIEW COMPARISON:  03/06/2020 FINDINGS: Cardiomegaly. Both lungs are clear. Disc degenerative disease of the thoracic spine. IMPRESSION: Cardiomegaly without acute abnormality of the lungs. Electronically Signed   By: Jearld Lesch M.D.   On: 01/17/2022 15:25   ? ?Procedures ?Procedures  ? ? ?Medications Ordered in ED ?Medications  ?aspirin chewable tablet 81 mg (has no administration in time range)  ?amiodarone (PACERONE) tablet 200 mg (has no administration in time range)  ?atorvastatin (LIPITOR) tablet 40 mg (has no administration in time range)  ?furosemide (LASIX) injection 60 mg (has no administration in time range)  ?sodium chloride flush (NS) 0.9 % injection 3 mL (has no administration in time range)  ?sodium chloride flush (NS) 0.9 % injection 3 mL (has no administration in time range)  ?0.9 %  sodium chloride infusion (has no administration in time range)  ?acetaminophen (TYLENOL) tablet 650 mg (has no  administration in time range)  ?ondansetron (ZOFRAN) injection 4 mg (has no administration in time range)  ?heparin injection 5,000 Units (has no administration in time range)  ?insulin aspart (novoLOG) injection 0-9 Units (has no administration in time range)  ?furosemide (LASIX) injection 60 mg (60 mg Intravenous Given 01/17/22 1410)  ? ? ?ED Course/ Medical Decision Making/ A&P ?  ?                        ?Medical Decision Making ?Amount and/or Complexity of Data Reviewed ?Labs: ordered. ?Radiology: ordered. ? ?Risk ?Prescription drug management. ?Decision regarding hospitalization. ? ? ?60YOM presents  to ED for evaluation of shortness of breath and lower extremity swelling. Please see HPI for further details. ? ?On examination, patient nonhypoxic on room air. Patient has 3+pitting edema bilaterally. Patient has rales and crackles. Abdomen soft nontender. Patient follows commands. Patient shivering uncontrollably on my initial examination however patient temperature is 97.65F orally. ? ?Patient worked up utilizing following labs and imaging studies interpreted by me personally: ?-BNP elevated to 2,334. Suspect patient is having acute HF exacerbation ?-Troponin 15 ?-BMP shows elevated glucose and creatinine. Patient has history of elevated creatinine ?-CBC shows elevated WBC to 18 however patient afebrile denies nausea vomiting ?-Chest xray shows stable cardiomegaly ? ?Patient given 60mg  lasix for diuresis.  ? ?At this time, it appears the patient is having acute CHF exacerbation. I have consulted with cardiology and am awaiting their call back. I have consulted with Dr. of hospitalist team who has accepted admission of patient for further management.  ? ? ?Final Clinical Impression(s) / ED Diagnoses ?Final diagnoses:  ?Acute on chronic congestive heart failure, unspecified heart failure type (HCC)  ? ? ?Rx / DC Orders ?ED Discharge Orders   ? ? None  ? ?  ? ? ?  ?Chipper Herb, PA-C ?01/17/22 1751 ? ?   ?01/19/22, MD ?01/17/22 1931 ? ?

## 2022-01-17 NOTE — ED Notes (Signed)
Attempted verbal report.

## 2022-01-17 NOTE — H&P (Signed)
?History and Physical  ? ? ?[name] [id number] DOB: [birth year] DOA: 01/17/2022 ? ?PCP: Default, Provider, MD (Confirm with patient/family/NH records and if not entered, this has to be entered at [facility] point of entry) ?Patient coming from: Home ? ?I have personally briefly reviewed patient's old medical records in [place] ? ?Chief Complaint: SOB, leg swelling ? ?HPI: Zachary Arias is a 61 y.o. 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 ? ?Patient has been only taking Lasix 20-40 mg as needed for weight gaining.  About 2 weeks ago, he increased Lasix to 20 mg daily and further increased to 40 mg daily for increasing leg swelling.  But, despite taking p.o. Lasix, he continues to gain weight > 1 pound/day, and 4 days ago, he decided to stop taking Lasix altogether.  Now, he has had increasing shortness of breath and leg swelling and come to the hospital.  Denies any chest pain, no cough no fever chills, no urinary problems, he does have occasionally diarrhea, but not yesterday or today.  Gained > 7-8 pound in last 2 months. ? ?ED Course: Blood pressure 125/91, patient was given IV Lasix 60 mg x 1 and made 700 mm of urine and breathing problem and swelling improved. ? ?Review of Systems: As per HPI otherwise 14 point review of systems negative.  ? ? ?Past Medical History:  ?Diagnosis Date  ? Congestive heart failure (CHF) ([place])   ? secondary to ischemic cardiomyopathy with an ejection fraction of 25-30%  ? Coronary artery disease   ? History of hypertension   ? Inguinal hernia 2005  ? History of left inguinal hernia, status post repair in 2005  ? Tachycardia 2010   ?  Unexplained tachycardia during hospitalization  ? ? ?Past Surgical History:  ?Procedure Laterality Date  ? CARDIAC CATHETERIZATION   06/27/2009  ? ejection  fraction of approximately 35%  ? INGUINAL HERNIA REPAIR  01/27/2004  ? History of left inguinal hernia, status post repair in 2005  ? RIGHT/LEFT HEART CATH AND CORONARY ANGIOGRAPHY N/A 03/10/2020  ? Procedure: RIGHT/LEFT HEART CATH AND CORONARY ANGIOGRAPHY;  Surgeon: Jolaine Artist, MD;  Location: [place] [facility];  Service: Cardiovascular;  Laterality: N/A;  ? ? ? reports that he has never smoked. He has never used smokeless tobacco. He reports that he does not currently use alcohol. He reports that he does not currently use drugs. ? ?Allergies  ?Allergen Reactions  ? Avocado Nausea Only  ? Shellfish Allergy Nausea And Vomiting  ? ? ?Family History  ?Problem Relation Age of Onset  ? Cancer Mother   ?     Ovarian Cancer  ? Heart disease Father   ? Heart attack Father   ? Cardiomyopathy Brother   ? ? ? ?Prior to Admission medications   ?Medication Sig Start Date End Date Taking? Authorizing Provider  ?amiodarone (PACERONE) 200 MG tablet Take 1 tablet (200 mg total) by mouth 2 (two) times daily. 12/21/21   Bensimhon, Shaune Pascal, MD  ?ASPIRIN 81 PO Take 81 mg by mouth daily.    [provider]  ?atorvastatin (LIPITOR) 40 MG tablet Take 1 tablet (40 mg total) by mouth daily. 11/25/21   Consuelo Pandy, PA-C  ?dapagliflozin propanediol (FARXIGA) 10 MG TABS tablet Take 1 tablet (10 mg total) by mouth daily before breakfast. 11/19/21   Rosita Fire,  Brittainy M, PA-C  ?furosemide (LASIX) 20 MG tablet Take 1 tablet (20 mg total) by mouth as needed. 11/19/21   Lyda Jester M, PA-C  ?sacubitril-valsartan (ENTRESTO) 97-103 MG Take 1 tablet by mouth 2 (two) times daily. 12/14/21   Bensimhon, Shaune Pascal, MD  ? ? ?Physical Exam: ?Vitals:  ? 01/17/22 1351 01/17/22 1352  ?BP: (!) 125/91   ?Pulse: (!) 39   ?Resp: 16   ?Temp: 97.9 ?F (36.6 ?C)   ?TempSrc: Oral   ?SpO2: 97%   ?Weight:  83.4 kg  ?Height:  5' 7.5" (1.715 m)  ? ? ?Constitutional: NAD, calm, comfortable ?Vitals:  ? 01/17/22 1351 01/17/22 1352  ?BP: (!) 125/91    ?Pulse: (!) 39   ?Resp: 16   ?Temp: 97.9 ?F (36.6 ?C)   ?TempSrc: Oral   ?SpO2: 97%   ?Weight:  83.4 kg  ?Height:  5' 7.5" (1.715 m)  ? ?Eyes: PERRL, lids and conjunctivae normal ?ENMT: Mucous membranes are moist. Posterior pharynx clear of any exudate or lesions.Normal dentition.  ?Neck: normal, supple, no masses, no thyromegaly ?Respiratory: clear to auscultation bilaterally, no wheezing, fine crackles on lower bases, normal respiratory effort. No accessory muscle use.  ?Cardiovascular: Regular rate and rhythm, no murmurs / rubs / gallops. 1+ extremity edema. 2+ pedal pulses. No carotid bruits.  ?Abdomen: no tenderness, no masses palpated. No hepatosplenomegaly. Bowel sounds positive.  ?Musculoskeletal: no clubbing / cyanosis. No joint deformity upper and lower extremities. Good ROM, no contractures. Normal muscle tone.  ?Skin: no rashes, lesions, ulcers. No induration ?Neurologic: CN 2-12 grossly intact. Sensation intact, DTR normal. Strength 5/5 in all 4.  ?Psychiatric: Normal judgment and insight. Alert and oriented x 3. Normal mood.  ? ? ? ?Labs on Admission: I have personally reviewed following labs and imaging studies ? ?CBC: ?Recent Labs  ?Lab 01/17/22 ?1400  ?WBC 18.8*  ?NEUTROABS 16.7*  ?HGB 14.8  ?HCT 46.7  ?MCV 90.0  ?PLT 262  ? ?Basic Metabolic Panel: ?Recent Labs  ?Lab 01/17/22 ?1400  ?NA 143  ?K 4.3  ?CL 110  ?CO2 21*  ?GLUCOSE 128*  ?BUN 19  ?CREATININE 1.69*  ?CALCIUM 8.6*  ? ?GFR: ?Estimated Creatinine Clearance: 49.1 mL/min (A) (by C-G formula based on SCr of 1.69 mg/dL (H)). ?Liver Function Tests: ?No results for input(s): AST, ALT, ALKPHOS, BILITOT, PROT, ALBUMIN in the last 168 hours. ?No results for input(s): LIPASE, AMYLASE in the last 168 hours. ?No results for input(s): AMMONIA in the last 168 hours. ?Coagulation Profile: ?No results for input(s): INR, PROTIME in the last 168 hours. ?Cardiac Enzymes: ?No results for input(s): CKTOTAL, CKMB, CKMBINDEX, TROPONINI in the last 168 hours. ?BNP  (last 3 results) ?No results for input(s): PROBNP in the last 8760 hours. ?HbA1C: ?No results for input(s): HGBA1C in the last 72 hours. ?CBG: ?No results for input(s): GLUCAP in the last 168 hours. ?Lipid Profile: ?No results for input(s): CHOL, HDL, LDLCALC, TRIG, CHOLHDL, LDLDIRECT in the last 72 hours. ?Thyroid Function Tests: ?No results for input(s): TSH, T4TOTAL, FREET4, T3FREE, THYROIDAB in the last 72 hours. ?Anemia Panel: ?No results for input(s): VITAMINB12, FOLATE, FERRITIN, TIBC, IRON, RETICCTPCT in the last 72 hours. ?Urine analysis: ?   ?Component Value Date/Time  ? COLORURINE YELLOW 03/05/2020 0557  ? APPEARANCEUR CLEAR 03/05/2020 0557  ? LABSPEC 1.025 03/05/2020 0557  ? PHURINE 5.5 03/05/2020 0557  ? GLUCOSEU NEGATIVE 03/05/2020 0557  ? HGBUR TRACE (A) 03/05/2020 0557  ? [place] NEGATIVE 03/05/2020 0557  ? KETONESUR NEGATIVE 03/05/2020  0557  ? PROTEINUR 100 (A) 03/05/2020 0557  ? UROBILINOGEN 0.2 06/24/2009 1116  ? NITRITE NEGATIVE 03/05/2020 0557  ? LEUKOCYTESUR NEGATIVE 03/05/2020 0557  ? ? ?Radiological Exams on Admission: ?DG Chest 2 View ? ?Result Date: 01/17/2022 ?CLINICAL DATA:  Shortness of breath EXAM: CHEST - 2 VIEW COMPARISON:  03/06/2020 FINDINGS: Cardiomegaly. Both lungs are clear. Disc degenerative disease of the thoracic spine. IMPRESSION: Cardiomegaly without acute abnormality of the lungs. Electronically Signed   By: Delanna Ahmadi M.D.   On: 01/17/2022 15:25   ? ?EKG: Independently reviewed. Sinus, no acute ST changes. ? ?Assessment/Plan ?Principal Problem: ?  CHF (congestive heart failure) ([place]) ?Active Problems: ?  Congestive heart failure ([place]) ?  AKI (acute kidney injury) ([place]) ? (please populate well all problems here in Problem List. (For example, if patient is on BP meds at home and you resume or decide to hold them, it is a problem that needs to be her. Same for CAD, COPD, HLD and so on) ? ?Acute on chronic combined diastolic and systolic CHF compensation ?-Significant  fluid overload ?-Continue IV Lasix 60 mg IV daily ?-Repeat echocardiogram ?-Consider discharge patient on either higher dose of Lasix or torsemide.  Cardiology paged in ED, waiting for callback. ? ?AKI on CKD

## 2022-01-17 NOTE — ED Triage Notes (Addendum)
Pt here via [facility] for BIL edema for several weeks that is increasing to the point of being painful to ambulate.  Pt states increased sob with exertion.  Denies chest pain.  Pt stopped taking lasix 1 week ago b/c he didn't feel it was working. ? ?Initial bp 82/67 that increased to 110/78. ?Hr 110 ?Rr 22 ? ?

## 2022-01-17 NOTE — ED Notes (Signed)
Notified charge RN that we are bringing up pt. ?

## 2022-01-18 ENCOUNTER — Inpatient Hospital Stay: Payer: Self-pay

## 2022-01-18 ENCOUNTER — Inpatient Hospital Stay (HOSPITAL_COMMUNITY): Payer: Medicaid Other

## 2022-01-18 ENCOUNTER — Institutional Professional Consult (permissible substitution): Payer: Self-pay | Admitting: Cardiology

## 2022-01-18 DIAGNOSIS — I5021 Acute systolic (congestive) heart failure: Secondary | ICD-10-CM

## 2022-01-18 DIAGNOSIS — I5023 Acute on chronic systolic (congestive) heart failure: Secondary | ICD-10-CM

## 2022-01-18 LAB — CBC WITH DIFFERENTIAL/PLATELET
Abs Immature Granulocytes: 0.05 10*3/uL (ref 0.00–0.07)
Basophils Absolute: 0 10*3/uL (ref 0.0–0.1)
Basophils Relative: 0 %
Eosinophils Absolute: 0 10*3/uL (ref 0.0–0.5)
Eosinophils Relative: 0 %
HCT: 38 % — ABNORMAL LOW (ref 39.0–52.0)
Hemoglobin: 12.2 g/dL — ABNORMAL LOW (ref 13.0–17.0)
Immature Granulocytes: 0 %
Lymphocytes Relative: 4 %
Lymphs Abs: 0.5 10*3/uL — ABNORMAL LOW (ref 0.7–4.0)
MCH: 28.2 pg (ref 26.0–34.0)
MCHC: 32.1 g/dL (ref 30.0–36.0)
MCV: 88 fL (ref 80.0–100.0)
Monocytes Absolute: 0.8 10*3/uL (ref 0.1–1.0)
Monocytes Relative: 7 %
Neutro Abs: 10.7 10*3/uL — ABNORMAL HIGH (ref 1.7–7.7)
Neutrophils Relative %: 89 %
Platelets: 206 10*3/uL (ref 150–400)
RBC: 4.32 MIL/uL (ref 4.22–5.81)
RDW: 15.9 % — ABNORMAL HIGH (ref 11.5–15.5)
WBC: 12.1 10*3/uL — ABNORMAL HIGH (ref 4.0–10.5)
nRBC: 0 % (ref 0.0–0.2)

## 2022-01-18 LAB — BASIC METABOLIC PANEL
Anion gap: 5 (ref 5–15)
BUN: 20 mg/dL (ref 6–20)
CO2: 22 mmol/L (ref 22–32)
Calcium: 7.8 mg/dL — ABNORMAL LOW (ref 8.9–10.3)
Chloride: 115 mmol/L — ABNORMAL HIGH (ref 98–111)
Creatinine, Ser: 1.89 mg/dL — ABNORMAL HIGH (ref 0.61–1.24)
GFR, Estimated: 40 mL/min — ABNORMAL LOW (ref >60)
Glucose, Bld: 121 mg/dL — ABNORMAL HIGH (ref 70–99)
Potassium: 4 mmol/L (ref 3.5–5.1)
Sodium: 142 mmol/L (ref 135–145)

## 2022-01-18 LAB — COOXEMETRY PANEL
Carboxyhemoglobin: 2.2 % — ABNORMAL HIGH (ref 0.5–1.5)
Carboxyhemoglobin: 2.1 % — ABNORMAL HIGH (ref 0.5–1.5)
Methemoglobin: <0.7 % (ref 0.0–1.5)
Methemoglobin: 1 % (ref 0.0–1.5)
O2 Saturation: 97.9 %
O2 Saturation: 78.4 %
Total hemoglobin: 12 g/dL (ref 12.0–16.0)
Total hemoglobin: 12.5 g/dL (ref 12.0–16.0)

## 2022-01-18 LAB — ECHOCARDIOGRAM COMPLETE
AR max vel: 2.1 cm2
AV Area VTI: 2.26 cm2
AV Area mean vel: 2.06 cm2
AV Mean grad: 5 mmHg
AV Peak grad: 8.9 mmHg
Ao pk vel: 1.49 m/s
Area-P 1/2: 6.12 cm2
Calc EF: 31.6 %
Height: 67 in
MV M vel: 4.88 m/s
MV Peak grad: 95.1 mmHg
S' Lateral: 5.3 cm
Single Plane A2C EF: 24.5 %
Single Plane A4C EF: 37.4 %
Weight: 2910.07 oz

## 2022-01-18 LAB — GLUCOSE, CAPILLARY
Glucose-Capillary: 116 mg/dL — ABNORMAL HIGH (ref 70–99)
Glucose-Capillary: 115 mg/dL — ABNORMAL HIGH (ref 70–99)
Glucose-Capillary: 117 mg/dL — ABNORMAL HIGH (ref 70–99)
Glucose-Capillary: 166 mg/dL — ABNORMAL HIGH (ref 70–99)

## 2022-01-18 LAB — HEMOGLOBIN A1C
Hgb A1c MFr Bld: 5.6 % (ref 4.8–5.6)
Mean Plasma Glucose: 114.02 mg/dL

## 2022-01-18 LAB — LACTIC ACID, PLASMA
Lactic Acid, Venous: 1.9 mmol/L (ref 0.5–1.9)
Lactic Acid, Venous: 2.7 mmol/L (ref 0.5–1.9)

## 2022-01-18 LAB — MAGNESIUM: Magnesium: 2.3 mg/dL (ref 1.7–2.4)

## 2022-01-18 MED ORDER — DOBUTAMINE IN D5W 4-5 MG/ML-% IV SOLN
INTRAVENOUS | Status: AC
  Filled 2022-01-18: qty 250

## 2022-01-18 MED ORDER — FUROSEMIDE 10 MG/ML IJ SOLN
80.0000 mg | Freq: Once | INTRAMUSCULAR | Status: AC
  Administered 2022-01-18: 80 mg via INTRAVENOUS
  Filled 2022-01-18: qty 8

## 2022-01-18 MED ORDER — DOBUTAMINE IN D5W 4-5 MG/ML-% IV SOLN
3.0000 ug/kg/min | INTRAVENOUS | Status: DC
  Administered 2022-01-18 (×2): 2.5 ug/kg/min via INTRAVENOUS
  Filled 2022-01-18: qty 250

## 2022-01-18 MED ORDER — SODIUM CHLORIDE 0.9% FLUSH
10.0000 mL | INTRAVENOUS | Status: DC | PRN
  Administered 2022-01-26: 30 mL

## 2022-01-18 MED ORDER — CHLORHEXIDINE GLUCONATE CLOTH 2 % EX PADS
6.0000 | MEDICATED_PAD | Freq: Every day | CUTANEOUS | Status: DC
  Administered 2022-01-18 – 2022-02-03 (×17): 6 via TOPICAL

## 2022-01-18 MED ORDER — MIDODRINE HCL 5 MG PO TABS
5.0000 mg | ORAL_TABLET | Freq: Three times a day (TID) | ORAL | Status: DC
  Administered 2022-01-18 – 2022-01-20 (×5): 5 mg via ORAL
  Filled 2022-01-18 (×5): qty 1

## 2022-01-18 MED ORDER — SODIUM CHLORIDE 0.9 % IV BOLUS
250.0000 mL | Freq: Once | INTRAVENOUS | Status: AC
  Administered 2022-01-18: 250 mL via INTRAVENOUS

## 2022-01-18 MED ORDER — DEXTROSE 5 % IV SOLN
20.0000 mg/h | INTRAVENOUS | Status: DC
  Administered 2022-01-18 – 2022-01-19 (×4): 20 mg/h via INTRAVENOUS
  Filled 2022-01-18 (×6): qty 20

## 2022-01-18 MED ORDER — FUROSEMIDE 10 MG/ML IJ SOLN
80.0000 mg | Freq: Two times a day (BID) | INTRAMUSCULAR | Status: DC
Start: 2022-01-18 — End: 2022-01-18

## 2022-01-18 MED ORDER — SODIUM CHLORIDE 0.9% FLUSH
10.0000 mL | Freq: Two times a day (BID) | INTRAVENOUS | Status: DC
  Administered 2022-01-18 – 2022-01-25 (×13): 10 mL
  Administered 2022-01-26: 40 mL
  Administered 2022-01-26: 10 mL
  Administered 2022-01-27: 40 mL
  Administered 2022-01-27 – 2022-02-02 (×8): 10 mL
  Administered 2022-02-02: 20 mL
  Administered 2022-02-03 – 2022-02-04 (×3): 10 mL

## 2022-01-18 NOTE — Progress Notes (Signed)
TR[facility] night cross cover note: ? ?I was notified by RN of the patient's systolic blood pressures in the 70s, relative to systolic blood pressure values in the ED noted to be in the 70s.  ? ?Per my chart review, this is a 61 year old male who was admitted earlier for 4/23 with acute CHF exacerbation.  Started on IV diuresis with ensuing urine output of greater than 2.2 L during which his systolic blood pressure decreased from the 90s to the 70s. ? ?I subsequently ordered small IV fluid bolus of 250 cc, following which systolic blood pressures increased into the high 80's mmHg. I then ordered another small IV fluid bolus of 250 cc's and asked to be contacted if no ensuing improvement in blood pressure.  ? ?I had also ordered repeat EKG, which showed QTc prolongation.  Add on serum magnesium level and discontinue the patient's existing order for prn Zofran.  Additionally, I have ordered a repeat EKG for the morning to further trend degree of ensuing QTc prolongation. ? ? ?Babs Bertin, DO ?Hospitalist ? ?

## 2022-01-18 NOTE — Progress Notes (Signed)
Heart Failure Navigator Progress Note  Assessed for Heart & Vascular TOC clinic readiness.  Patient does not meet criteria due to Advanced Heart Failure Team..     Derriana Oser, BSN, RN Heart Failure Nurse Navigator Secure Chat Only   

## 2022-01-18 NOTE — Progress Notes (Signed)
Echocardiogram ?2D Echocardiogram has been performed. ? ?Devonne Doughty ?01/18/2022, 12:39 PM ?

## 2022-01-18 NOTE — Consult Note (Addendum)
?  ?Advanced Heart Failure Team Consult Note ? ? ?Primary Physician: Default, Provider, MD ?PCP-Cardiologist:  None ? ?Reason for Consultation: A/C HFrEF ? ?HPI:   ? ?Zachary Arias is seen today for evaluation of A/C HFrEF/Shock  at the request of Dr Zigmund Shefali Ng.  ? ?Zachary Arias is a 61 year old with history of HF due to mixed ischemic/non-ischemic CM (diagnosed 9/10) with EF 25-30% range. Cath with 1-v CAD with chronically occluded RCA. MRI in January 2011 with EF 31% with inferior scar. Also has a history of CKD stage IIIa and PVCs.  ?  ?Brother had heart transplant in [place] several months ago. Dad also had previous heart transplant.  ?  ?He was lost to f/u since 2015. Admitted 03/05/20 with cardiogenic shock after stopping meds for months and using supplements. SBP in 70-80s. ECHO EF 20-25% RV normal. Severe MR. Having frequent PVCs. Placed on amiodarone to suppress PVCs ?  ?[facility] 03/10/20 showed stable CAD (chronically occluded distal RCA and moderate non-obstructive CAD elsewhere), low filling pressures and normal cardiac output on milrinone 0.25. Was able to wean off milrinone. Started back on GDMT. ?  ?Repeat echo 01/05/21 EF 40-45% mild to mod MR RV mild HK  ?  ?He has been out of entresto for 3 months. He has been unable to pay for medications. He was taking 40 mg furosemide but later stopped beecause he didn't think it would help. He has not been taking any medications  for at least 1 month. Lives alone. No longer works. He is disabled.  ? ?Presented to [facility] with increased shortness of breath and lower extremity edema. Over the last few week worsening lower extremity edema. Pertinent admission labs: BNP >2300, creatinine HS Trop 15, WBC 18.8, Hgb 14.8, CO2 21, and creatinine 1.7. CXR cardiomegaly. Renal US no hydronephrosis. Started on IV lasix. Hypotensive today. Given fluid bolus without improvement. Advanced Heart Failure Team consulted for shock.  ?  ?Cardiac Testing  ?Echo April 2011 showed EF 30-35%. ?Echo  August 2013 showed EF 45-50% with inferior and inferoseptal hypokinesis ?Echo  07/07/20 EF 30-35% ?Echo 01/05/21 EF 40-45% mild to mod MR RV mild HK  ?  ? ?Review of Systems: [y] = yes, [ ]  = no  ? ?General: Weight gain [ Y]; Weight loss [ ] ; Anorexia [ ] ; Fatigue [ Y]; Fever [ ] ; Chills [ ] ; Weakness [ Y]  ?Cardiac: Chest pain/pressure [ ] ; Resting SOB [ ] ; Exertional SOB [ Y]; Orthopnea [ Y]; Pedal Edema [ Y]; Palpitations [ ] ; Syncope [ ] ; Presyncope [ ] ; Paroxysmal nocturnal dyspnea[ ]   ?Pulmonary: Cough [ ] ; Wheezing[ ] ; Hemoptysis[ ] ; Sputum [ ] ; Snoring [ ]   ?GI: Vomiting[ ] ; Dysphagia[ ] ; Melena[ ] ; Hematochezia [ ] ; Heartburn[ ] ; Abdominal pain [ ] ; Constipation [ ] ; Diarrhea [ ] ; BRBPR [ ]   ?GU: Hematuria[ ] ; Dysuria [ ] ; Nocturia[ ]   ?Vascular: Pain in legs with walking [ ] ; Pain in feet with lying flat [ ] ; Non-healing sores [ ] ; Stroke [ ] ; TIA [ ] ; Slurred speech [ ] ;  ?Neuro: Headaches[ ] ; Vertigo[ ] ; Seizures[ ] ; Paresthesias[ ] ;Blurred vision [ ] ; Diplopia [ ] ; Vision changes [ ]   ?Ortho/Skin: Arthritis [ ] ; Joint pain [ Y]; Muscle pain [ ] ; Joint swelling [ ] ; Back Pain [ ] ; Rash [ ]   ?Psych: Depression[ ] ; Anxiety[ ]   ?Heme: Bleeding problems [ ] ; Clotting disorders [ ] ; Anemia [ ]   ?Endocrine: Diabetes [ ] ; Thyroid dysfunction[ ]  ? ?Home Medications ?Prior  to Admission medications   ?Medication Sig Start Date End Date Taking? Authorizing Provider  ?amiodarone (PACERONE) 200 MG tablet Take 1 tablet (200 mg total) by mouth 2 (two) times daily. 12/21/21  Yes Jaidan Stachnik, Shaune Pascal, MD  ?ASPIRIN 81 PO Take 81 mg by mouth daily.   Yes [provider]  ?atorvastatin (LIPITOR) 40 MG tablet Take 1 tablet (40 mg total) by mouth daily. 11/25/21  Yes Lyda Jester M, PA-C  ?dapagliflozin propanediol (FARXIGA) 10 MG TABS tablet Take 1 tablet (10 mg total) by mouth daily before breakfast. 11/19/21  Yes Lyda Jester M, PA-C  ?furosemide (LASIX) 20 MG tablet Take 1 tablet (20 mg total) by mouth as  needed. ?Patient taking differently: Take 20 mg by mouth daily as needed for fluid or edema. 11/19/21  Yes Consuelo Pandy, PA-C  ?Multiple Vitamin (MULTIVITAMIN) capsule Take 1 capsule by mouth daily.   Yes [provider]  ?sacubitril-valsartan (ENTRESTO) 97-103 MG Take 1 tablet by mouth 2 (two) times daily. 12/14/21  Yes Roda Lauture, Shaune Pascal, MD  ? ? ?Past Medical History: ?Past Medical History:  ?Diagnosis Date  ? Congestive heart failure (CHF) ([place])   ? secondary to ischemic cardiomyopathy with an ejection fraction of 25-30%  ? Coronary artery disease   ? History of hypertension   ? Inguinal hernia 2005  ? History of left inguinal hernia, status post repair in 2005  ? Tachycardia 2010   ?  Unexplained tachycardia during hospitalization  ? ? ?Past Surgical History: ?Past Surgical History:  ?Procedure Laterality Date  ? CARDIAC CATHETERIZATION   06/27/2009  ? ejection fraction of approximately 35%  ? INGUINAL HERNIA REPAIR  01/27/2004  ? History of left inguinal hernia, status post repair in 2005  ? RIGHT/LEFT HEART CATH AND CORONARY ANGIOGRAPHY N/A 03/10/2020  ? Procedure: RIGHT/LEFT HEART CATH AND CORONARY ANGIOGRAPHY;  Surgeon: Jolaine Artist, MD;  Location: [place] [facility];  Service: Cardiovascular;  Laterality: N/A;  ? ? ?Family History: ?Family History  ?Problem Relation Age of Onset  ? Cancer Mother   ?     Ovarian Cancer  ? Heart disease Father   ? Heart attack Father   ? Cardiomyopathy Brother   ? ? ?Social History: ?Social History  ? ?Socioeconomic History  ? Marital status: Single  ?  Spouse name: Not on file  ? Number of children: Not on file  ? Years of education: Not on file  ? Highest education level: Not on file  ?Occupational History  ? Not on file  ?Tobacco Use  ? Smoking status: Never  ? Smokeless tobacco: Never  ?Vaping Use  ? Vaping Use: Not on file  ?Substance and Sexual Activity  ? Alcohol use: Not Currently  ? Drug use: Not Currently  ? Sexual activity: Not on file  ?Other  Topics Concern  ? Not on file  ?Social History Narrative  ? Not on file  ? ?Social Determinants of Health  ? ?Financial Resource Strain: Not on file  ?Food Insecurity: Not on file  ?Transportation Needs: Unmet Transportation Needs  ? Lack of Transportation (Medical): Yes  ? Lack of Transportation (Non-Medical): Yes  ?Physical Activity: Not on file  ?Stress: Not on file  ?Social Connections: Not on file  ? ? ?Allergies:  ?Allergies  ?Allergen Reactions  ? Avocado Nausea Only  ? Shellfish Allergy Nausea And Vomiting  ? ? ?Objective:   ? ?Vital Signs:   ?Temp:  [97.3 ?F (36.3 ?C)-98.7 ?F (37.1 ?C)] 98.1 ?  F (36.7 ?C) (04/24 0500) ?Pulse Rate:  [39-106] 89 (04/24 0500) ?Resp:  [15-22] 20 (04/24 0500) ?BP: (75-125)/(40-91) 95/61 (04/24 0500) ?SpO2:  [91 %-100 %] 99 % (04/24 0500) ?Weight:  [82.5 kg-83.4 kg] 82.5 kg (04/24 0447) ?Last BM Date : 01/16/22 ? ?Weight change: ?Filed Weights  ? 01/17/22 1352 01/17/22 1929 01/18/22 0447  ?Weight: 83.4 kg 82.5 kg 82.5 kg  ? ? ?Intake/Output:  ? ?Intake/Output Summary (Last 24 hours) at 01/18/2022 1004 ?Last data filed at 01/18/2022 0400 ?Gross per 24 hour  ?Intake --  ?Output 2750 ml  ?Net -2750 ml  ?  ? ? ?Physical Exam  ?  ?General:  . No resp difficulty ?HEENT: normal ?Neck: supple. JVP to jaw . Carotids 2+ bilat; no bruits. No lymphadenopathy or thyromegaly appreciated. ?Cor: PMI nondisplaced. Regular rate & rhythm. No rubs, gallops or murmurs. ?Lungs: clear ?Abdomen: soft, nontender, nondistended. No hepatosplenomegaly. No bruits or masses. Good bowel sounds. ?Extremities:warm, no cyanosis, clubbing, rash, R and LLE 3+ edema ?Neuro: alert & orientedx3, cranial nerves grossly intact. moves all 4 extremities w/o difficulty. Affect pleasant ? ? ?Telemetry  ? ?SR - 90-100s ? ?EKG  ?  ?SR 88 bpm personally checked.  ? ?Labs  ? ?Basic Metabolic Panel: ?Recent Labs  ?Lab 01/17/22 ?1400 01/18/22 ?0106  ?NA 143 142  ?K 4.3 4.0  ?CL 110 115*  ?CO2 21* 22  ?GLUCOSE 128* 121*  ?BUN 19 20   ?CREATININE 1.69* 1.89*  ?CALCIUM 8.6* 7.8*  ?MG 2.2 2.3  ? ? ?Liver Function Tests: ?No results for input(s): AST, ALT, ALKPHOS, BILITOT, PROT, ALBUMIN in the last 168 hours. ?No results for input(s): LIP

## 2022-01-18 NOTE — Progress Notes (Signed)
Patient had a blood pressure with systolic's in the upper 70's and 80's at 0930. Paged MD Jerolyn Center who reached out to Heart failure team. Lasix held for now d/t low blood pressure and per heart failure team patient was started on dobutamine. Last blood pressure was 92/61 with a MAP of 70. Will continue to monitor.  ?

## 2022-01-18 NOTE — Progress Notes (Signed)
Lab contacted with critical value at 1507 of a lactic acid of 2.7. Text paged MD Jerolyn Center at 1510 to notify her of critical value.  ?

## 2022-01-18 NOTE — Progress Notes (Signed)
?   01/17/22 2200  ?Assess: MEWS Score  ?BP (!) 75/40  ?Pulse Rate 89  ?ECG Heart Rate 89  ?Resp 17  ?Level of Consciousness Alert  ?SpO2 96 %  ?O2 Device Room Air  ?Assess: MEWS Score  ?MEWS Temp 0  ?MEWS Systolic 2  ?MEWS Pulse 0  ?MEWS RR 0  ?MEWS LOC 0  ?MEWS Score 2  ?MEWS Score Color Yellow  ?Assess: if the MEWS score is Yellow or Red  ?Were vital signs taken at a resting state? Yes  ?Focused Assessment No change from prior assessment  ?Early Detection of Sepsis Score *See Row Information* Low  ?MEWS guidelines implemented *See Row Information* Yes  ?Treat  ?Pain Scale 0-10  ?Pain Score 3  ?Pain Type Acute pain  ?Pain Location Leg  ?Pain Orientation Right  ?Pain Intervention(s) Repositioned  ?Take Vital Signs  ?Increase Vital Sign Frequency  Yellow: Q 2hr X 2 then Q 4hr X 2, if remains yellow, continue Q 4hrs  ?Escalate  ?MEWS: Escalate Yellow: discuss with charge nurse/RN and consider discussing with provider and RRT  ?Notify: Charge Nurse/RN  ?Name of Charge Nurse/RN Notified Bobette Mo  ?Date Charge Nurse/RN Notified 01/17/22  ?Time Charge Nurse/RN Notified 2200  ?Notify: Provider  ?Provider Name/Title Dr. Velia Meyer  ?Date Provider Notified 01/17/22  ?Time Provider Notified 2200  ?Notification Type Page  ?Notification Reason Other (Comment) ?(soft BP)  ?Provider response See new orders  ?Date of Provider Response 01/17/22  ?Time of Provider Response 2200  ?Document  ?Patient Outcome Other (Comment) ?(stable)  ?Progress note created (see row info) Yes  ? ? ?

## 2022-01-18 NOTE — Progress Notes (Signed)
NP Zachary Arias aware that last 2 BP's were 82/49 at 1500 and 84/57 at 1600. Patient is asymptomatic, NP adding midodrine. Will continue to monitor.  ?

## 2022-01-18 NOTE — Progress Notes (Signed)
Arrived to place PICC, ECHO is in the room just getting started.  Will return later. ?

## 2022-01-18 NOTE — Progress Notes (Signed)
Peripherally Inserted Central Catheter Placement ? ?The IV Nurse has discussed with the patient and/or persons authorized to consent for the patient, the purpose of this procedure and the potential benefits and risks involved with this procedure.  The benefits include less needle sticks, lab draws from the catheter, and the patient may be discharged home with the catheter. Risks include, but not limited to, infection, bleeding, blood clot (thrombus formation), and puncture of an artery; nerve damage and irregular heartbeat and possibility to perform a PICC exchange if needed/ordered by physician.  Alternatives to this procedure were also discussed.  Bard Power PICC patient education guide, fact sheet on infection prevention and patient information card has been provided to patient /or left at bedside.   ? ?PICC Placement Documentation  ?PICC Triple Lumen [id number] Right Basilic 42 cm 1 cm (Active)  ?Indication for Insertion or Continuance of Line Vasoactive infusions 01/18/22 1300  ?Exposed Catheter (cm) 1 cm 01/18/22 1300  ?Site Assessment Clean, Dry, Intact 01/18/22 1300  ?Lumen #1 Status Flushed;Blood return noted 01/18/22 1300  ?Lumen #2 Status Flushed;Blood return noted 01/18/22 1300  ?Lumen #3 Status Flushed;Blood return noted 01/18/22 1300  ?Dressing Type Transparent;Securing device 01/18/22 1300  ?Dressing Status Clean, Dry, Intact;Antimicrobial disc in place 01/18/22 1300  ?Dressing Change Due 01/25/22 01/18/22 1300  ? ? ? ? ? ?Jule Economy Horton ?01/18/2022, 1:36 PM ? ?

## 2022-01-18 NOTE — Progress Notes (Signed)
?PROGRESS NOTE ? ? ? ?Zachary Arias  [id number] DOB: [birth year] DOA: 01/17/2022 ?PCP: Default, Provider, MD  ?Brief Narrative: Zachary Arias is a 61 y.o. 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 ?No fever chills cough dysuria diarrhea ?He has not been taking lasix as prescribed. ?On admit bnp 2334 ?Wbc 18,8 cr 1.69 ?Chest xray CM ?Echo 01/18/2022 EF 30 to 35% moderately decreased function of the left ventricle left ventricle demonstrates global hypokinesis elevated pulmonary artery pressure enlarged right ventricle pericardial effusion posterior to the left ventricle left atrial size severely dilated ? ?Assessment & Plan: ?  ?Principal Problem: ?  CHF (congestive heart failure) ([place]) ?Active Problems: ?  Congestive heart failure ([place]) ?  AKI (acute kidney injury) ([place]) ? ?#1 acute systolic heart failure/cardiogenic shock-Lasix from this morning was held due to low blood pressure.  He received a 250 cc bolus overnight without significant improvement.  Heart failure team consulted and appreciated.  Plan to place PICC line and start him on dobutamine and hopefully to restart Lasix once blood pressure improves.  Consult TOC as patient cannot afford his medications. ?Echo 01/05/2022 EF 40 to 45% ?Entresto on hold ?[place] on hold ? ?#2 leukocytosis with no signs of localized infection.  Chest x-ray negative.  No diarrhea no urinary complaints no fever cough.   ?WBC is 12.1 down from 18.8 on admission ? ? ?#3 AKI on CKD stage II renal ultrasound with no hydronephrosis.  Entresto on hold.  Monitor daily. ? ?#4  Type 2 diabetes Farxiga on hold continue SSI hemoglobin A1c 5.6. ?CBG (last 3)  ?Recent Labs  ?  01/17/22 ?2302 01/18/22 ?0630 01/18/22 ?1148  ?GLUCAP 196* 116* 115*  ? ?Estimated body mass index is 28.49 kg/m? as calculated  from the following: ?  Height as of this encounter: 5\' 7"  (1.702 m). ?  Weight as of this encounter: 82.5 kg. ? ?DVT prophylaxis: Heparin ?Code Status: Full code ?Family Communication: None at bedside ?Disposition Plan:  Status is: Inpatient ?Remains inpatient appropriate because: Cardiogenic shock ?  ?Consultants:  ?Heart failure team ?Procedures: PICC line placed 01/18/2022 ?Antimicrobials: None ? ?Subjective: ?He was resting in bed ?Has complaints of shortness of breath and bilateral lower extremity edema which is better than yesterday after getting Lasix. ?Objective: ?Vitals:  ? 01/18/22 0800 01/18/22 0900 01/18/22 1000 01/18/22 1100  ?BP: (!) 80/48 (!) 80/45 (!) 74/56 92/61  ?Pulse: 84 82 84 97  ?Resp: 17 16 15 18   ?Temp:    98.4 ?F (36.9 ?C)  ?TempSrc:    Oral  ?SpO2: 94% 97% 98% 97%  ?Weight:      ?Height:      ? ? ?Intake/Output Summary (Last 24 hours) at 01/18/2022 1407 ?Last data filed at 01/18/2022 0400 ?Gross per 24 hour  ?Intake --  ?Output 2750 ml  ?Net -2750 ml  ? ?Filed Weights  ? 01/17/22 1352 01/17/22 1929 01/18/22 0447  ?Weight: 83.4 kg 82.5 kg 82.5 kg  ? ? ?Examination: ? ?General exam: Appears  IN NAD ?Respiratory system: Clear to auscultation. Respiratory effort normal. ?Cardiovascular system: S1 & S2 heard, RRR. No JVD, murmurs, rubs, gallops or clicks. No pedal edema. ?Gastrointestinal system: Abdomen is nondistended, soft and nontender. No organomegaly or masses felt. Normal bowel sounds heard. ?Central nervous system: Alert and oriented. No focal neurological deficits. ?Extremities:  3 PLUS  EDEMA  ?Skin: No rashes, lesions or ulcers ?Psychiatry: Judgement and insight appear normal. Mood & affect appropriate.  ? ? ? ?Data Reviewed: I have personally reviewed following labs and imaging studies ? ?CBC: ?Recent Labs  ?Lab 01/17/22 ?1400 01/18/22 ?0106  ?WBC 18.8* 12.1*  ?NEUTROABS 16.7* 10.7*  ?HGB 14.8 12.2*  ?HCT 46.7 38.0*  ?MCV 90.0 88.0  ?PLT 262 206  ? ?Basic Metabolic Panel: ?Recent Labs   ?Lab 01/17/22 ?1400 01/18/22 ?0106  ?NA 143 142  ?K 4.3 4.0  ?CL 110 115*  ?CO2 21* 22  ?GLUCOSE 128* 121*  ?BUN 19 20  ?CREATININE 1.69* 1.89*  ?CALCIUM 8.6* 7.8*  ?MG 2.2 2.3  ? ?GFR: ?Estimated Creatinine Clearance: 43.3 mL/min (A) (by C-G formula based on SCr of 1.89 mg/dL (H)). ?Liver Function Tests: ?No results for input(s): AST, ALT, ALKPHOS, BILITOT, PROT, ALBUMIN in the last 168 hours. ?No results for input(s): LIPASE, AMYLASE in the last 168 hours. ?No results for input(s): AMMONIA in the last 168 hours. ?Coagulation Profile: ?No results for input(s): INR, PROTIME in the last 168 hours. ?Cardiac Enzymes: ?No results for input(s): CKTOTAL, CKMB, CKMBINDEX, TROPONINI in the last 168 hours. ?BNP (last 3 results) ?No results for input(s): PROBNP in the last 8760 hours. ?HbA1C: ?Recent Labs  ?  01/18/22 ?0106  ?HGBA1C 5.6  ? ?CBG: ?Recent Labs  ?Lab 01/17/22 ?1711 01/17/22 ?2302 01/18/22 ?0630 01/18/22 ?1148  ?GLUCAP 108* 196* 116* 115*  ? ?Lipid Profile: ?No results for input(s): CHOL, HDL, LDLCALC, TRIG, CHOLHDL, LDLDIRECT in the last 72 hours. ?Thyroid Function Tests: ?No results for input(s): TSH, T4TOTAL, FREET4, T3FREE, THYROIDAB in the last 72 hours. ?Anemia Panel: ?No results for input(s): VITAMINB12, FOLATE, FERRITIN, TIBC, IRON, RETICCTPCT in the last 72 hours. ?Sepsis Labs: ?Recent Labs  ?Lab 01/18/22 ?1041  ?LATICACIDVEN 1.9  ? ? ?Recent Results (from the past 240 hour(s))  ?MRSA Next Gen by PCR, Nasal     Status: None  ? Collection Time: 01/17/22  7:32 PM  ? Specimen: Nasal Mucosa; Nasal Swab  ?Result Value Ref Range Status  ? MRSA by PCR Next Gen NOT DETECTED NOT DETECTED Final  ?  Comment: (NOTE) ?The GeneXpert MRSA Assay (FDA approved for NASAL specimens only), ?is one component of a comprehensive MRSA colonization surveillance ?program. It is not intended to diagnose MRSA infection nor to guide ?or monitor treatment for MRSA infections. ?Test performance is not FDA approved in patients less  than 2 years ?old. ?Performed at [facility], [place] [address] [place], [place] ?[address] ?  ?  ? ? ? ? ? ?Radiology Studies: ?DG Chest 2 View ? ?Result Date: 01/17/2022 ?CLINICAL DATA:  Shortness of breath EXAM: CHEST - 2 VIEW COMPARISON:  03/06/2020 FINDINGS: Cardiomegaly. Both lungs are clear. Disc degenerative disease of the thoracic spine. IMPRESSION: Cardiomegaly without acute abnormality of the lungs. Electronically Signed   By: Delanna Ahmadi M.D.   On: 01/17/2022 15:25  ? ?US RENAL ? ?Result Date: 01/17/2022 ?CLINICAL DATA:  Acute kidney injury EXAM: RENAL / URINARY TRACT ULTRASOUND COMPLETE COMPARISON:  None. FINDINGS: Right Kidney: Renal measurements: 10.6 x 3.9 x 4.1 cm = volume: 89 mL. Echogenicity within normal limits. No mass or hydronephrosis visualized. Left Kidney: Renal measurements: 11.3 x 5.6 x 5.2 cm = volume: 170 mL. Echogenicity within normal limits. No mass or hydronephrosis visualized. Bladder: Appears normal for degree of bladder distention. Other: None. IMPRESSION: No ultrasound abnormality of the kidneys.  No hydronephrosis. Electronically Signed  By: Delanna Ahmadi M.D.   On: 01/17/2022 18:22  ? ?ECHOCARDIOGRAM COMPLETE ? ?Result Date: 01/18/2022 ?   ECHOCARDIOGRAM REPORT   Patient Name:   MACKLYN PARPART Date of Exam: 01/18/2022 Medical Rec #:  [id number]     Height:       67.0 in Accession #:    [id number]    Weight:       181.9 lb Date of Birth:  [birth year]     BSA:          1.942 m? Patient Age:    60 years      BP:           95/61 mmHg Patient Gender: M             HR:           89 bpm. Exam Location:  Inpatient Procedure: 2D Echo Indications:    Acute systolic CHF  History:        Patient has prior history of Echocardiogram examinations, most                 recent 01/05/2021. CHF, CAD, Arrythmias:Tachycardia; Risk                 Factors:Hypertension.  Sonographer:    Arlyss Gandy Referring Phys: [id number] [place]  1. Left ventricular ejection fraction, by  estimation, is 30 to 35%. The left ventricle has moderately decreased function. The left ventricle demonstrates global hypokinesis. The left ventricular internal cavity size was severely dilated. Left ventricular

## 2022-01-19 LAB — COOXEMETRY PANEL
Carboxyhemoglobin: 2.1 % — ABNORMAL HIGH (ref 0.5–1.5)
Methemoglobin: <0.7 % (ref 0.0–1.5)
O2 Saturation: 68.5 %
Total hemoglobin: 14.1 g/dL (ref 12.0–16.0)

## 2022-01-19 LAB — BASIC METABOLIC PANEL
Anion gap: 9 (ref 5–15)
BUN: 21 mg/dL — ABNORMAL HIGH (ref 6–20)
CO2: 25 mmol/L (ref 22–32)
Calcium: 7.7 mg/dL — ABNORMAL LOW (ref 8.9–10.3)
Chloride: 105 mmol/L (ref 98–111)
Creatinine, Ser: 1.85 mg/dL — ABNORMAL HIGH (ref 0.61–1.24)
GFR, Estimated: 41 mL/min — ABNORMAL LOW (ref >60)
Glucose, Bld: 217 mg/dL — ABNORMAL HIGH (ref 70–99)
Potassium: 3.7 mmol/L (ref 3.5–5.1)
Sodium: 139 mmol/L (ref 135–145)

## 2022-01-19 LAB — CBC
HCT: 40.4 % (ref 39.0–52.0)
Hemoglobin: 13.2 g/dL (ref 13.0–17.0)
MCH: 28.4 pg (ref 26.0–34.0)
MCHC: 32.7 g/dL (ref 30.0–36.0)
MCV: 86.9 fL (ref 80.0–100.0)
Platelets: 201 10*3/uL (ref 150–400)
RBC: 4.65 MIL/uL (ref 4.22–5.81)
RDW: 15.9 % — ABNORMAL HIGH (ref 11.5–15.5)
WBC: 10.9 10*3/uL — ABNORMAL HIGH (ref 4.0–10.5)
nRBC: 0 % (ref 0.0–0.2)

## 2022-01-19 LAB — COMPREHENSIVE METABOLIC PANEL
ALT: 37 U/L (ref 0–44)
AST: 27 U/L (ref 15–41)
Albumin: 2.9 g/dL — ABNORMAL LOW (ref 3.5–5.0)
Alkaline Phosphatase: 45 U/L (ref 38–126)
Anion gap: 11 (ref 5–15)
BUN: 20 mg/dL (ref 6–20)
CO2: 25 mmol/L (ref 22–32)
Calcium: 7.8 mg/dL — ABNORMAL LOW (ref 8.9–10.3)
Chloride: 100 mmol/L (ref 98–111)
Creatinine, Ser: 1.82 mg/dL — ABNORMAL HIGH (ref 0.61–1.24)
GFR, Estimated: 42 mL/min — ABNORMAL LOW (ref >60)
Glucose, Bld: 245 mg/dL — ABNORMAL HIGH (ref 70–99)
Potassium: 2.9 mmol/L — ABNORMAL LOW (ref 3.5–5.1)
Sodium: 136 mmol/L (ref 135–145)
Total Bilirubin: 1.3 mg/dL — ABNORMAL HIGH (ref 0.3–1.2)
Total Protein: 5.4 g/dL — ABNORMAL LOW (ref 6.5–8.1)

## 2022-01-19 LAB — GLUCOSE, CAPILLARY
Glucose-Capillary: 141 mg/dL — ABNORMAL HIGH (ref 70–99)
Glucose-Capillary: 107 mg/dL — ABNORMAL HIGH (ref 70–99)
Glucose-Capillary: 248 mg/dL — ABNORMAL HIGH (ref 70–99)
Glucose-Capillary: 101 mg/dL — ABNORMAL HIGH (ref 70–99)

## 2022-01-19 LAB — MAGNESIUM: Magnesium: 2.2 mg/dL (ref 1.7–2.4)

## 2022-01-19 LAB — HIV ANTIBODY (ROUTINE TESTING W REFLEX): HIV Screen 4th Generation wRfx: NONREACTIVE

## 2022-01-19 MED ORDER — POTASSIUM CHLORIDE CRYS ER 20 MEQ PO TBCR
40.0000 meq | EXTENDED_RELEASE_TABLET | ORAL | Status: AC
  Administered 2022-01-19 (×2): 40 meq via ORAL
  Filled 2022-01-19 (×2): qty 2

## 2022-01-19 MED ORDER — SALINE SPRAY 0.65 % NA SOLN
1.0000 | NASAL | Status: DC | PRN
  Administered 2022-01-19 (×2): 1 via NASAL
  Filled 2022-01-19: qty 44

## 2022-01-19 MED ORDER — POTASSIUM CHLORIDE CRYS ER 20 MEQ PO TBCR
40.0000 meq | EXTENDED_RELEASE_TABLET | Freq: Once | ORAL | Status: AC
  Administered 2022-01-19: 40 meq via ORAL
  Filled 2022-01-19: qty 2

## 2022-01-19 NOTE — Progress Notes (Signed)
?  Transition of Care (TOC) Screening Note ? ? ?Patient Details  ?Name: Harvy Magnano ?Date of Birth: 07/21/1961 ? ? ?Transition of Care (TOC) CM/SW Contact:    ?Jevonte Clanton, LCSW ?Phone Number: ?01/19/2022, 11:28 AM ? ? ? ?Transition of Care Department [facility]) has reviewed patient and no TOC needs have been identified at this time. We will continue to monitor patient advancement through interdisciplinary progression rounds.Patient will also benefit from medication assistance. Patient will benefit from PT/OT consult for disposition recommendations.  If new patient transition needs arise, please place a TOC consult. ?  ?

## 2022-01-19 NOTE — Progress Notes (Signed)
TRH night cross cover note: ? ?I was notified by RN that the patient's morning serum potassium level is 2.9.  While on Lasix drip overnight, he has had 5.7 L of urine output since 1900 on 01/18/2022.  Most recent blood pressure is 96/69, which is trending up relative to prior. ? ?This morning's BMP also notable for improving renal function concomitant with this volume of urine output.  ? ?I subsequently placed order for Kcl 40 mEq PO Q4H x 2 doses, and added-on a serum Mag level.  ? ? ? ?Babs Bertin, DO ?Hospitalist ? ?

## 2022-01-19 NOTE — Progress Notes (Addendum)
? ? Advanced Heart Failure Rounding Note ? ?PCP-Cardiologist: None  ? ?Subjective:   ?4/24 Cardiogenic shock. Started on DBA 3 mcg.  Also started on midodrine. Given 80 mg IV lasix and started on lasix drip.  ? ?Brisk diuresis noted. Negative 8.1 liters. Weight down 14 pounds.  ? ?CO-OX 68.5%.  ? ?Feeling better. Denies SOB.  ? ? ?Objective:   ?Weight Range: ?75.8 kg ?Body mass index is 26.17 kg/m?.  ? ?Vital Signs:   ?Temp:  [97.8 ?F (36.6 ?C)-99 ?F (37.2 ?C)] 97.8 ?F (36.6 ?C) (04/25 0825) ?Pulse Rate:  [80-97] 83 (04/25 0825) ?Resp:  [15-20] 15 (04/25 0825) ?BP: (74-126)/(42-69) 126/63 (04/25 0825) ?SpO2:  [94 %-99 %] 94 % (04/25 0825) ?Weight:  [75.8 kg] 75.8 kg (04/25 0411) ?Last BM Date : 01/16/22 ? ?Weight change: ?Filed Weights  ? 01/17/22 1929 01/18/22 0447 01/19/22 0411  ?Weight: 82.5 kg 82.5 kg 75.8 kg  ? ? ?Intake/Output:  ? ?Intake/Output Summary (Last 24 hours) at 01/19/2022 0838 ?Last data filed at 01/19/2022 1448 ?Gross per 24 hour  ?Intake 924.5 ml  ?Output 9100 ml  ?Net -8175.5 ml  ?  ? ? ?Physical Exam  ? CVP 2  ?General:   No resp difficulty ?HEENT: Normal ?Neck: Supple. JVP flat . Carotids 2+ bilat; no bruits. No lymphadenopathy or thyromegaly appreciated. ?Cor: PMI nondisplaced. Regular rate & rhythm. No rubs, gallops or murmurs. ?Lungs: Clear ?Abdomen: Soft, nontender, nondistended. No hepatosplenomegaly. No bruits or masses. Good bowel sounds. ?Extremities: No cyanosis, clubbing, rash, R and LLE 1+ edema. RUE PICC  ?Neuro: Alert & orientedx3, cranial nerves grossly intact. moves all 4 extremities w/o difficulty. Affect pleasant ? ? ?Telemetry  ? ?SR 80-90s  ? ?EKG  ?  ?N/A  ? ?Labs  ?  ?CBC ?Recent Labs  ?  01/17/22 ?1400 01/18/22 ?0106 01/19/22 ?0400  ?WBC 18.8* 12.1* 10.9*  ?NEUTROABS 16.7* 10.7*  --   ?HGB 14.8 12.2* 13.2  ?HCT 46.7 38.0* 40.4  ?MCV 90.0 88.0 86.9  ?PLT 262 206 201  ? ?Basic Metabolic Panel ?Recent Labs  ?  01/18/22 ?0106 01/19/22 ?0400  ?NA 142 136  ?K 4.0 2.9*  ?CL 115*  100  ?CO2 22 25  ?GLUCOSE 121* 245*  ?BUN 20 20  ?CREATININE 1.89* 1.82*  ?CALCIUM 7.8* 7.8*  ?MG 2.3 2.2  ? ?Liver Function Tests ?Recent Labs  ?  01/19/22 ?0400  ?AST 27  ?ALT 37  ?ALKPHOS 45  ?BILITOT 1.3*  ?PROT 5.4*  ?ALBUMIN 2.9*  ? ?No results for input(s): LIPASE, AMYLASE in the last 72 hours. ?Cardiac Enzymes ?No results for input(s): CKTOTAL, CKMB, CKMBINDEX, TROPONINI in the last 72 hours. ? ?BNP: ?BNP (last 3 results) ?Recent Labs  ?  11/19/21 ?1423 12/21/21 ?1255 01/17/22 ?1400  ?BNP 588.9* 887.4* 2,334.9*  ? ? ?ProBNP (last 3 results) ?No results for input(s): PROBNP in the last 8760 hours. ? ? ?D-Dimer ?No results for input(s): DDIMER in the last 72 hours. ?Hemoglobin A1C ?Recent Labs  ?  01/18/22 ?0106  ?HGBA1C 5.6  ? ?Fasting Lipid Panel ?No results for input(s): CHOL, HDL, LDLCALC, TRIG, CHOLHDL, LDLDIRECT in the last 72 hours. ?Thyroid Function Tests ?No results for input(s): TSH, T4TOTAL, T3FREE, THYROIDAB in the last 72 hours. ? ?Invalid input(s): FREET3 ? ?Other results: ? ? ?Imaging  ? ? ?ECHOCARDIOGRAM COMPLETE ? ?Result Date: 01/18/2022 ?   ECHOCARDIOGRAM REPORT   Patient Name:   Zachary Arias Date of Exam: 01/18/2022 Medical Rec #:  [id number]  Height:       67.0 in Accession #:    [id number]    Weight:       181.9 lb Date of Birth:  May 28, 1961     BSA:          1.942 m? Patient Age:    61 years      BP:           95/61 mmHg Patient Gender: M             HR:           89 bpm. Exam Location:  Inpatient Procedure: 2D Echo Indications:    Acute systolic CHF  History:        Patient has prior history of Echocardiogram examinations, most                 recent 01/05/2021. CHF, CAD, Arrythmias:Tachycardia; Risk                 Factors:Hypertension.  Sonographer:    Arlyss Gandy Referring Phys: [id number] [place]  1. Left ventricular ejection fraction, by estimation, is 30 to 35%. The left ventricle has moderately decreased function. The left ventricle demonstrates global hypokinesis.  The left ventricular internal cavity size was severely dilated. Left ventricular diastolic parameters are indeterminate. Elevated left ventricular end-diastolic pressure.  2. Right ventricular systolic function is normal. The right ventricular size is mildly enlarged. There is severely elevated pulmonary artery systolic pressure. The estimated right ventricular systolic pressure is [id number] mmHg.  3. Left atrial size was severely dilated.  4. The pericardial effusion is posterior to the left ventricle.  5. The mitral valve is degenerative. Severe mitral valve regurgitation. No evidence of mitral stenosis.  6. Tricuspid valve regurgitation is moderate.  7. The aortic valve is normal in structure. Aortic valve regurgitation is trivial. No aortic stenosis is present.  8. The inferior vena cava is dilated in size with <50% respiratory variability, suggesting right atrial pressure of 15 mmHg. FINDINGS  Left Ventricle: Left ventricular ejection fraction, by estimation, is 30 to 35%. The left ventricle has moderately decreased function. The left ventricle demonstrates global hypokinesis. The left ventricular internal cavity size was severely dilated. There is no left ventricular hypertrophy. Left ventricular diastolic parameters are indeterminate. Elevated left ventricular end-diastolic pressure. Right Ventricle: The right ventricular size is mildly enlarged. No increase in right ventricular wall thickness. Right ventricular systolic function is normal. There is severely elevated pulmonary artery systolic pressure. The tricuspid regurgitant velocity is 3.50 m/s, and with an assumed right atrial pressure of 15 mmHg, the estimated right ventricular systolic pressure is [id number] mmHg. Left Atrium: Left atrial size was severely dilated. Right Atrium: Right atrial size was normal in size. Pericardium: Trivial pericardial effusion is present. The pericardial effusion is posterior to the left ventricle. Mitral Valve: The mitral valve is  degenerative in appearance. There is mild thickening of the mitral valve leaflet(s). Severe mitral valve regurgitation. No evidence of mitral valve stenosis. Tricuspid Valve: The tricuspid valve is normal in structure. Tricuspid valve regurgitation is moderate . No evidence of tricuspid stenosis. Aortic Valve: The aortic valve is normal in structure. Aortic valve regurgitation is trivial. No aortic stenosis is present. Aortic valve mean gradient measures 5.0 mmHg. Aortic valve peak gradient measures 8.9 mmHg. Aortic valve area, by VTI measures 2.26 cm?. Pulmonic Valve: The pulmonic valve was normal in structure. Pulmonic valve regurgitation is not visualized. No evidence of pulmonic stenosis. Aorta: The aortic root  is normal in size and structure. Venous: The inferior vena cava is dilated in size with less than 50% respiratory variability, suggesting right atrial pressure of 15 mmHg. IAS/Shunts: No atrial level shunt detected by color flow Doppler.  LEFT VENTRICLE PLAX 2D LVIDd:         7.10 cm      Diastology LVIDs:         5.30 cm      LV e' medial:    10.60 cm/s LV PW:         0.90 cm      LV E/e' medial:  15.6 LV IVS:        0.90 cm      LV e' lateral:   12.90 cm/s LVOT diam:     2.00 cm      LV E/e' lateral: 12.8 LV SV:         58 LV SV Index:   30 LVOT Area:     3.14 cm?  LV Volumes (MOD) LV vol d, MOD A2C: 143.0 ml LV vol d, MOD A4C: 137.0 ml LV vol s, MOD A2C: 108.0 ml LV vol s, MOD A4C: 85.8 ml LV SV MOD A2C:     35.0 ml LV SV MOD A4C:     137.0 ml LV SV MOD BP:      44.4 ml RIGHT VENTRICLE             IVC RV Basal diam:  4.40 cm     IVC diam: 2.30 cm RV Mid diam:    2.70 cm RV S prime:     19.30 cm/s TAPSE (M-mode): 2.7 cm LEFT ATRIUM              Index        RIGHT ATRIUM           Index LA diam:        5.70 cm  2.93 cm/m?   RA Area:     19.10 cm? LA Vol (A2C):   161.0 ml 82.89 ml/m?  RA Volume:   48.80 ml  25.13 ml/m? LA Vol (A4C):   117.0 ml 60.24 ml/m? LA Biplane Vol: 139.0 ml 71.57 ml/m?  AORTIC VALVE                      PULMONIC VALVE AV Area (Vmax):    2.10 cm?      PV Vmax:       0.88 m/s AV Area (Vmean):   2.06 cm?      PV Peak grad:  3.1 mmHg AV Area (VTI):     2.26 cm? AV Vmax:           14

## 2022-01-19 NOTE — Progress Notes (Addendum)
RN took over care of patient. Pt A&O resting in bed. ?

## 2022-01-19 NOTE — Plan of Care (Signed)

## 2022-01-19 NOTE — TOC Initial Note (Addendum)
Transition of Care (TOC) - Initial/Assessment Note  ? ? ?Patient Details  ?Name: Zachary Arias ?MRN: [id number] ?Date of Birth: Oct 28, 1960 ? ?Transition of Care [facility]) CM/SW Contact:    ?Marcheta Grammes Rexene Alberts, RN ?Phone Number: [phone] ?01/19/2022, 2:42 PM ? ?Clinical Narrative:                 ?HF TOC CM spoke to pt and states he uses [employer] for transportation to appts. States he used [employer] for meds covered under HF program. Pt reports having scale and tries to adhere to a low sodium diet. States he has not seen a PCP in a long time. [facility] arranged for follow with PCP on 01/26/2022 at 930 am. Will have meds come up from [facility] at dc. Will used HF fund/MATCH for medication cost at dc.  ? ?Financial Counselors met with pt to day to receive signature for Medicaid application.  ? ? ? ? ?Expected Discharge Plan: Home/Self Care ?Barriers to Discharge: Continued Medical Work up ? ? ?Patient Goals and CMS Choice ?Patient states their goals for this hospitalization and ongoing recovery are:: wants to get back to baseline ?CMS Medicare.gov Compare Post Acute Care list provided to:: Patient ?  ? ?Expected Discharge Plan and Services ?Expected Discharge Plan: Home/Self Care ?  ?Discharge Planning Services: CM Consult, Medication Assistance, [place] Program ?  ?Living arrangements for the past 2 months: Apartment ?                ?  ?  ?  ?  ?  ?  ?  ?  ?  ?  ? ?Prior Living Arrangements/Services ?Living arrangements for the past 2 months: Apartment ?Lives with:: Self ?Patient language and need for interpreter reviewed:: Yes ?Do you feel safe going back to the place where you live?: Yes      ?Need for Family Participation in Patient Care: No (Comment) ?Care giver support system in place?: No (comment) ?  ?Criminal Activity/Legal Involvement Pertinent to Current Situation/Hospitalization: No - Comment as needed ? ?Activities of Daily Living ?  ?  ? ?Permission Sought/Granted ?Permission  sought to share information with : Case Manager ?Permission granted to share information with : Yes, Verbal Permission Granted ? Share Information with NAME: Zachary Arias ?   ? Permission granted to share info w Relationship: brother ? Permission granted to share info w Contact Information: [phone] ? ?Emotional Assessment ?Appearance:: Appears stated age ?Attitude/Demeanor/Rapport: Engaged ?Affect (typically observed): Accepting ?Orientation: : Oriented to Self, Oriented to Place, Oriented to  Time, Oriented to Situation ?  ?Psych Involvement: No (comment) ? ?Admission diagnosis:  CHF (congestive heart failure) ([place]) [I50.9] ?Acute on chronic congestive heart failure, unspecified heart failure type ([place]) [I50.9] ?Patient Active Problem List  ? Diagnosis Date Noted  ? AKI (acute kidney injury) ([place]) 01/17/2022  ? CHF (congestive heart failure) ([place]) 01/17/2022  ? Cardiogenic shock ([place]) 03/05/2020  ? Hypotension 03/05/2020  ? Hyperkalemia 03/05/2020  ? Elevated WBC count 03/05/2020  ? CKD (chronic kidney disease), stage III ([place]) 03/05/2020  ? Chronic bilateral pleural effusions   ? Hyperlipidemia LDL goal <70 09/18/2014  ? CAD, NATIVE VESSEL 07/22/2009  ? SYSTOLIC HEART FAILURE, CHRONIC 07/22/2009  ? HYPERTENSION 07/07/2009  ? CAD 07/07/2009  ? Congestive heart failure ([place]) 07/07/2009  ? TACHYCARDIA 07/07/2009  ? ?PCP:  Default, Provider, MD ?Pharmacy:   ?[facility] [address], [place] ?[phone]  [place] ?[place] [address] ?Phone: [phone] Fax: [phone] ? ?[facility] ?[address] [place] ?[place] [address] ?Phone: [phone] Fax: [phone] ? ? ? ? ?Social Determinants of Health (SDOH) Interventions ?  ? ?Readmission Risk Interventions ?   ? View : No data to display.  ?  ?  ?  ? ? ? ?

## 2022-01-19 NOTE — Evaluation (Signed)
Physical Therapy Evaluation ?Patient Details ?Name: Zachary Arias ?MRN: [id number] ?DOB: 12-28-1960 ?Today's Date: 01/19/2022 ? ?History of Present Illness ? Pt is a 61 y.o. M who presents 01/17/2022 with worsening SOB and lower extremity swelling. Admitted with acute systolic herat failure/cardiogenic shock. 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  ?Clinical Impression ? PTA, pt lives alone in a third level apartment and is independent. Pt reports improvement in symptoms; still continues with BLE swelling distally. Pt ambulating 400 ft with no assistive device modI; SpO2 97% on RA, HR peak 101 bpm, BP 98/64 post mobility (pt denies dizziness). Will continue to follow acutely. Don't anticipate need for PT follow up. ?   ? ?Recommendations for follow up therapy are one component of a multi-disciplinary discharge planning process, led by the attending physician.  Recommendations may be updated based on patient status, additional functional criteria and insurance authorization. ? ?Follow Up Recommendations No PT follow up ? ?  ?Assistance Recommended at Discharge PRN  ?Patient can return home with the following ? Help with stairs or ramp for entrance ? ?  ?Equipment Recommendations None recommended by PT  ?Recommendations for Other Services ?    ?  ?Functional Status Assessment Patient has had a recent decline in their functional status and demonstrates the ability to make significant improvements in function in a reasonable and predictable amount of time.  ? ?  ?Precautions / Restrictions Precautions ?Precautions: None ?Restrictions ?Weight Bearing Restrictions: No  ? ?  ? ?Mobility ? Bed Mobility ?Overal bed mobility: Independent ?  ?  ?  ?  ?  ?  ?  ?  ? ?Transfers ?Overall transfer level: Independent ?Equipment used: None ?  ?  ?  ?  ?  ?  ?  ?  ?  ? ?Ambulation/Gait ?Ambulation/Gait assistance: Modified independent  (Device/Increase time) ?Gait Distance (Feet): 540 Feet ?Assistive device: None ?Gait Pattern/deviations: Step-through pattern, Decreased stride length, Wide base of support, Decreased dorsiflexion - right ?Gait velocity: decreased ?  ?  ?General Gait Details: Mildly antalgic due to R ankle discomfort, overall modI, steady pace ? ?Stairs ?  ?  ?  ?  ?  ? ?Wheelchair Mobility ?  ? ?Modified Rankin (Stroke Patients Only) ?  ? ?  ? ?Balance Overall balance assessment: No apparent balance deficits (not formally assessed) ?  ?  ?  ?  ?  ?  ?  ?  ?  ?  ?  ?  ?  ?  ?  ?  ?  ?  ?   ? ? ? ?Pertinent Vitals/Pain Pain Assessment ?Pain Assessment: Faces ?Faces Pain Scale: Hurts a little bit ?Pain Location: R ankle ?Pain Descriptors / Indicators: Guarding, Sore ?Pain Intervention(s): Monitored during session  ? ? ?Home Living Family/patient expects to be discharged to:: Private residence ?Living Arrangements: Alone ?  ?Type of Home: Apartment ?Home Access: Stairs to enter ?  ?Entrance Stairs-Number of Steps:  (2 flights) ?  ?Home Layout: One level ?Home Equipment: None ?   ?  ?Prior Function Prior Level of Function : Independent/Modified Independent ?  ?  ?  ?  ?  ?  ?Mobility Comments: On disability ?ADLs Comments: Groceries through [employer] ?  ? ? ?Hand Dominance  ?   ? ?  ?Extremity/Trunk Assessment  ? Upper Extremity Assessment ?Upper Extremity Assessment: Overall WFL for tasks assessed ?  ? ?Lower Extremity Assessment ?Lower Extremity Assessment: RLE  deficits/detail;LLE deficits/detail ?RLE Deficits / Details: Strength 5/5, increased edema distally ?LLE Deficits / Details: Strength 5/5, increased edema distally ?  ? ?Cervical / Trunk Assessment ?Cervical / Trunk Assessment: Normal  ?Communication  ? Communication: No difficulties  ?Cognition Arousal/Alertness: Awake/alert ?Behavior During Therapy: [facility] for tasks assessed/performed ?Overall Cognitive Status: Within Functional Limits for tasks assessed ?  ?  ?  ?  ?  ?  ?  ?   ?  ?  ?  ?  ?  ?  ?  ?  ?  ?  ?  ? ?  ?General Comments   ? ?  ?Exercises    ? ?Assessment/Plan  ?  ?PT Assessment Patient needs continued PT services  ?PT Problem List Decreased strength;Decreased activity tolerance;Decreased balance;Decreased mobility ? ?   ?  ?PT Treatment Interventions Gait training;Stair training;Functional mobility training;Therapeutic activities;Balance training;Therapeutic exercise;Patient/family education   ? ?PT Goals (Current goals can be found in the Care Plan section)  ?Acute Rehab PT Goals ?Patient Stated Goal: did not state ?PT Goal Formulation: With patient ?Time For Goal Achievement: 02/02/22 ?Potential to Achieve Goals: Good ? ?  ?Frequency Min 3X/week ?  ? ? ?Co-evaluation   ?  ?  ?  ?  ? ? ?  ?AM-PAC PT "6 Clicks" Mobility  ?Outcome Measure Help needed turning from your back to your side while in a flat bed without using bedrails?: None ?Help needed moving from lying on your back to sitting on the side of a flat bed without using bedrails?: None ?Help needed moving to and from a bed to a chair (including a wheelchair)?: None ?Help needed standing up from a chair using your arms (e.g., wheelchair or bedside chair)?: None ?Help needed to walk in hospital room?: None ?Help needed climbing 3-5 steps with a railing? : A Little ?6 Click Score: 23 ? ?  ?End of Session   ?Activity Tolerance: Patient tolerated treatment well ?Patient left: in chair;with call bell/phone within reach ?Nurse Communication: Mobility status ?PT Visit Diagnosis: Other abnormalities of gait and mobility (R26.89);Difficulty in walking, not elsewhere classified (R26.2) ?  ? ?Time: [id number] ?PT Time Calculation (min) (ACUTE ONLY): 18 min ? ? ?Charges:   PT Evaluation ?$PT Eval Low Complexity: 1 Low ?  ?  ?   ? ? ?Wyona Almas, PT, DPT ?Acute Rehabilitation Services ?Pager [phone] ?Office [phone] ? ? ?[name] ?01/19/2022, 5:18 PM ? ?

## 2022-01-19 NOTE — Plan of Care (Signed)
?  Problem: Education: Goal: Knowledge of General Education information will improve Description: Including pain rating scale, medication(s)/side effects and non-pharmacologic comfort measures Outcome: Progressing   Problem: Activity: Goal: Risk for activity intolerance will decrease Outcome: Progressing   Problem: Nutrition: Goal: Adequate nutrition will be maintained Outcome: Progressing   Problem: Coping: Goal: Level of anxiety will decrease Outcome: Progressing   Problem: Elimination: Goal: Will not experience complications related to bowel motility Outcome: Progressing Goal: Will not experience complications related to urinary retention Outcome: Progressing   Problem: Pain Managment: Goal: General experience of comfort will improve Outcome: Progressing   Problem: Skin Integrity: Goal: Risk for impaired skin integrity will decrease Outcome: Progressing   

## 2022-01-19 NOTE — Progress Notes (Addendum)
?PROGRESS NOTE ? ? ? ?Zachary Arias  [id number] DOB: 1961-09-18 DOA: 01/17/2022 ?PCP: Default, Provider, MD  ?Brief Narrative: Zachary Arias is a 61 y.o. 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 ?No fever chills cough dysuria diarrhea ?He has not been taking lasix as prescribed. ?On admit bnp 2334 ?Wbc 18,8 cr 1.69 ?Chest xray CM ?Echo 01/18/2022 EF 30 to 35% moderately decreased function of the left ventricle left ventricle demonstrates global hypokinesis elevated pulmonary artery pressure enlarged right ventricle pericardial effusion posterior to the left ventricle left atrial size severely dilated ? ?Assessment & Plan: ?  ?Principal Problem: ?  CHF (congestive heart failure) ([place]) ?Active Problems: ?  Congestive heart failure ([place]) ?  AKI (acute kidney injury) ([place]) ? ?#1 acute systolic heart failure/cardiogenic shock-patient was being treated with Lasix drip and dobutamine drip overnight.  He has lost 14 pounds and his breathing has improved significantly.  Followed by heart failure team appreciate their input.  ?He had PICC line placed on 01/18/2022.  ?  Consult TOC as patient cannot afford his medications. ?Echo 01/05/2022 EF 40 to 45% ?Repeat echo this admission with EF 30 to 35%. ?Entresto on hold ?[place] on hold ?Continue Lasix drip, dobutamine drip, midodrine per heart failure team. ? ?#2 leukocytosis with no signs of localized infection.  Chest x-ray negative.  No diarrhea no urinary complaints no fever cough.   ?WBC is 10.9 from 12.1 down from 18.8 on admission ? ? ?#3 AKI on CKD stage 3A  ?renal ultrasound with no hydronephrosis. ?  Entresto on hold.  Monitor daily on Lasix drip ? ?#4  Type 2 diabetes Farxiga on hold continue SSI hemoglobin A1c 5.6. ?CBG (last 3)  ?Recent Labs  ?  01/18/22 ?2149 01/19/22 ?0619  01/19/22 ?1133  ?GLUCAP 166* 141* 107*  ? ?#5 hypokalemia potassium 2.9 magnesium 2.2 replete potassium ?Recheck BMP later today ? ?#6 PVC -on amiodarone ?Replete potassium ?Magnesium normal ?Recheck BMP again today ? ? ?Estimated body mass index is 26.17 kg/m? as calculated from the following: ?  Height as of this encounter: 5\' 7"  (1.702 m). ?  Weight as of this encounter: 75.8 kg. ? ?DVT prophylaxis: Heparin ?Code Status: Full code ?Family Communication: None at bedside ?Disposition Plan:  Status is: Inpatient ?Remains inpatient appropriate because: Cardiogenic shock ?  ?Consultants:  ?Heart failure team ?Procedures: PICC line placed 01/18/2022 ?Antimicrobials: None ? ?Subjective: ?Patient resting in bed feels better weight down ?Negative over 9 L last 24 hours ? ?Objective: ?Vitals:  ? 01/19/22 0825 01/19/22 0900 01/19/22 1000 01/19/22 1136  ?BP: (!) 86/50  (!) 81/51 (!) 83/64  ?Pulse: 88 86 81 98  ?Resp: 20 17 14 19   ?Temp: (!) 97.3 ?F (36.3 ?C)   97.9 ?F (36.6 ?C)  ?TempSrc: Oral   Oral  ?SpO2: 97% 95% 95% 100%  ?Weight:      ?Height:      ? ? ?Intake/Output Summary (Last 24 hours) at 01/19/2022 1216 ?Last data filed at 01/19/2022 1146 ?Gross per 24 hour  ?Intake 1351.63 ml  ?Output 10375 ml  ?Net -9023.37 ml  ? ? ?Filed Weights  ? 01/17/22 1929 01/18/22 0447 01/19/22 0411  ?Weight: 82.5 kg 82.5 kg 75.8 kg  ? ? ?Examination: ? ?General exam: Appears in no acute distress ?Respiratory system: Crackles to auscultation. Respiratory effort normal. ?Cardiovascular system: S1 &  S2 heard, irregular no JVD, murmurs, rubs, gallops or clicks ?Gastrointestinal system: Abdomen is nondistended, soft and nontender. No organomegaly or masses felt. Normal bowel sounds heard. ?Central nervous system: Alert and oriented. No focal neurological deficits. ?Extremities: Decreasing edema  ?skin: No rashes, lesions or ulcers ?Psychiatry: Judgement and insight appear normal. Mood & affect appropriate.  ? ? ? ?Data Reviewed: I have  personally reviewed following labs and imaging studies ? ?CBC: ?Recent Labs  ?Lab 01/17/22 ?1400 01/18/22 ?0106 01/19/22 ?0400  ?WBC 18.8* 12.1* 10.9*  ?NEUTROABS 16.7* 10.7*  --   ?HGB 14.8 12.2* 13.2  ?HCT 46.7 38.0* 40.4  ?MCV 90.0 88.0 86.9  ?PLT 262 206 201  ? ? ?Basic Metabolic Panel: ?Recent Labs  ?Lab 01/17/22 ?1400 01/18/22 ?0106 01/19/22 ?0400  ?NA 143 142 136  ?K 4.3 4.0 2.9*  ?CL 110 115* 100  ?CO2 21* 22 25  ?GLUCOSE 128* 121* 245*  ?BUN 19 20 20   ?CREATININE 1.69* 1.89* 1.82*  ?CALCIUM 8.6* 7.8* 7.8*  ?MG 2.2 2.3 2.2  ? ? ?GFR: ?Estimated Creatinine Clearance: 40.9 mL/min (A) (by C-G formula based on SCr of 1.82 mg/dL (H)). ?Liver Function Tests: ?Recent Labs  ?Lab 01/19/22 ?0400  ?AST 27  ?ALT 37  ?ALKPHOS 45  ?BILITOT 1.3*  ?PROT 5.4*  ?ALBUMIN 2.9*  ? ?No results for input(s): LIPASE, AMYLASE in the last 168 hours. ?No results for input(s): AMMONIA in the last 168 hours. ?Coagulation Profile: ?No results for input(s): INR, PROTIME in the last 168 hours. ?Cardiac Enzymes: ?No results for input(s): CKTOTAL, CKMB, CKMBINDEX, TROPONINI in the last 168 hours. ?BNP (last 3 results) ?No results for input(s): PROBNP in the last 8760 hours. ?HbA1C: ?Recent Labs  ?  01/18/22 ?0106  ?HGBA1C 5.6  ? ? ?CBG: ?Recent Labs  ?Lab 01/18/22 ?1148 01/18/22 ?1725 01/18/22 ?2149 01/19/22 ?[id number] 01/19/22 ?1133  ?GLUCAP 115* 117* 166* 141* 107*  ? ? ?Lipid Profile: ?No results for input(s): CHOL, HDL, LDLCALC, TRIG, CHOLHDL, LDLDIRECT in the last 72 hours. ?Thyroid Function Tests: ?No results for input(s): TSH, T4TOTAL, FREET4, T3FREE, THYROIDAB in the last 72 hours. ?Anemia Panel: ?No results for input(s): VITAMINB12, FOLATE, FERRITIN, TIBC, IRON, RETICCTPCT in the last 72 hours. ?Sepsis Labs: ?Recent Labs  ?Lab 01/18/22 ?1041 01/18/22 ?1312  ?LATICACIDVEN 1.9 2.7*  ? ? ? ?Recent Results (from the past 240 hour(s))  ?MRSA Next Gen by PCR, Nasal     Status: None  ? Collection Time: 01/17/22  7:32 PM  ? Specimen: Nasal  Mucosa; Nasal Swab  ?Result Value Ref Range Status  ? MRSA by PCR Next Gen NOT DETECTED NOT DETECTED Final  ?  Comment: (NOTE) ?The GeneXpert MRSA Assay (FDA approved for NASAL specimens only), ?is one component of a comprehensive MRSA colonization surveillance ?program. It is not intended to diagnose MRSA infection nor to guide ?or monitor treatment for MRSA infections. ?Test performance is not FDA approved in patients less than 2 years ?old. ?Performed at [facility], [place] [address] [place], [place] ?[address] ?  ?Culture, blood (Routine X 2) w Reflex to ID Panel     Status: None (Preliminary result)  ? Collection Time: 01/18/22 10:41 AM  ? Specimen: BLOOD  ?Result Value Ref Range Status  ? Specimen Description BLOOD RIGHT ANTECUBITAL  Final  ? Special Requests   Final  ?  BOTTLES DRAWN AEROBIC AND ANAEROBIC Blood Culture adequate volume  ? Culture   Final  ?  NO GROWTH < 24 HOURS ?Performed  at [facility], [place] [address] [place], [place] [address] ?  ? Report Status PENDING  Incomplete  ?Culture, blood (Routine X 2) w Reflex to ID Panel     Status: None (Preliminary result)  ? Collection Time: 01/18/22 10:45 AM  ? Specimen: BLOOD  ?Result Value Ref Range Status  ? Specimen Description BLOOD BLOOD RIGHT FOREARM  Final  ? Special Requests   Final  ?  BOTTLES DRAWN AEROBIC AND ANAEROBIC Blood Culture adequate volume  ? Culture   Final  ?  NO GROWTH < 24 HOURS ?Performed at [facility], [place] [address], [place], [place] [address] ?  ? Report Status PENDING  Incomplete  ? ?  ? ? ? ? ? ?Radiology Studies: ?DG Chest 2 View ? ?Result Date: 01/17/2022 ?CLINICAL DATA:  Shortness of breath EXAM: CHEST - 2 VIEW COMPARISON:  03/06/2020 FINDINGS: Cardiomegaly. Both lungs are clear. Disc degenerative disease of the thoracic spine. IMPRESSION: Cardiomegaly without acute abnormality of the lungs. Electronically Signed   By: Delanna Ahmadi M.D.   On: 01/17/2022 15:25  ? ?US RENAL ? ?Result Date:  01/17/2022 ?CLINICAL DATA:  Acute kidney injury EXAM: RENAL / URINARY TRACT ULTRASOUND COMPLETE COMPARISON:  None. FINDINGS: Right Kidney: Renal measurements: 10.6 x 3.9 x 4.1 cm = volume: 89 mL. Echogenicity within normal limits.

## 2022-01-19 NOTE — Progress Notes (Addendum)
?  Mobility Specialist Criteria Algorithm Info. ? ? ? 01/19/22 1240  ?Mobility  ?Activity Ambulated with assistance in hallway  ?Range of Motion/Exercises Active;All extremities  ?Level of Assistance Modified independent, requires aide device or extra time  ?Assistive Device None  ?Distance Ambulated (ft) 420 ft  ?Activity Response Tolerated well  ? ?Patient received in supine eager to participate in mobility. Completed ortho VS, pt did not have a significant drop with positional changes. Ambulated in hallway mod I with slow gait. Pain and swelling in RLE limited speed but was able to ambulate without complaint or incident. Was left dangling EOB with all needs met, call bell in reach. ? ?01/19/2022 ?12:40PM ? ?[name], CMS, BS EXP ?Acute Rehabilitation Services  ?TMYTR:[phone] ?Office: [phone] ? ?

## 2022-01-20 DIAGNOSIS — I5023 Acute on chronic systolic (congestive) heart failure: Secondary | ICD-10-CM

## 2022-01-20 DIAGNOSIS — R570 Cardiogenic shock: Secondary | ICD-10-CM

## 2022-01-20 DIAGNOSIS — N1831 Chronic kidney disease, stage 3a: Secondary | ICD-10-CM

## 2022-01-20 LAB — BASIC METABOLIC PANEL
Anion gap: 10 (ref 5–15)
BUN: 24 mg/dL — ABNORMAL HIGH (ref 6–20)
CO2: 25 mmol/L (ref 22–32)
Calcium: 8.2 mg/dL — ABNORMAL LOW (ref 8.9–10.3)
Chloride: 102 mmol/L (ref 98–111)
Creatinine, Ser: 1.72 mg/dL — ABNORMAL HIGH (ref 0.61–1.24)
GFR, Estimated: 45 mL/min — ABNORMAL LOW (ref >60)
Glucose, Bld: 286 mg/dL — ABNORMAL HIGH (ref 70–99)
Potassium: 3.4 mmol/L — ABNORMAL LOW (ref 3.5–5.1)
Sodium: 137 mmol/L (ref 135–145)

## 2022-01-20 LAB — COOXEMETRY PANEL
Carboxyhemoglobin: 1.4 % (ref 0.5–1.5)
Methemoglobin: <0.7 % (ref 0.0–1.5)
O2 Saturation: 69.6 %
Total hemoglobin: 14.1 g/dL (ref 12.0–16.0)

## 2022-01-20 LAB — GLUCOSE, CAPILLARY
Glucose-Capillary: 120 mg/dL — ABNORMAL HIGH (ref 70–99)
Glucose-Capillary: 163 mg/dL — ABNORMAL HIGH (ref 70–99)
Glucose-Capillary: 109 mg/dL — ABNORMAL HIGH (ref 70–99)
Glucose-Capillary: 128 mg/dL — ABNORMAL HIGH (ref 70–99)

## 2022-01-20 MED ORDER — TORSEMIDE 20 MG PO TABS
40.0000 mg | ORAL_TABLET | Freq: Every day | ORAL | Status: DC
  Administered 2022-01-20 – 2022-01-21 (×2): 40 mg via ORAL
  Filled 2022-01-20 (×3): qty 2

## 2022-01-20 MED ORDER — POTASSIUM CHLORIDE CRYS ER 20 MEQ PO TBCR
40.0000 meq | EXTENDED_RELEASE_TABLET | Freq: Once | ORAL | Status: AC
  Administered 2022-01-20: 40 meq via ORAL
  Filled 2022-01-20: qty 2

## 2022-01-20 MED ORDER — MIDODRINE HCL 5 MG PO TABS
10.0000 mg | ORAL_TABLET | Freq: Three times a day (TID) | ORAL | Status: DC
  Administered 2022-01-20 – 2022-01-27 (×21): 10 mg via ORAL
  Filled 2022-01-20 (×22): qty 2

## 2022-01-20 MED ORDER — MIDODRINE HCL 5 MG PO TABS: 10.0000 mg | ORAL_TABLET | Freq: Three times a day (TID) | ORAL | Status: DC

## 2022-01-20 MED ORDER — DOBUTAMINE IN D5W 4-5 MG/ML-% IV SOLN
1.5000 ug/kg/min | INTRAVENOUS | Status: DC
  Filled 2022-01-20: qty 250

## 2022-01-20 NOTE — Progress Notes (Signed)
Orthopedic Tech Progress Note ?Patient Details:  ?Zachary Arias ?1960-09-30 ?[id number] ? ?Went to apply [employer] patient legs look amazing, NP is canceling UNNA BOOT orders  ? ?Patient ID: Zachary Arias, male   DOB: 12-08-1960, 61 y.o.   MRN: [id number] ? ?Zachary Arias ?01/20/2022, 8:53 AM ? ?

## 2022-01-20 NOTE — Progress Notes (Signed)
? ?PROGRESS NOTE ? ? ? ?Bronco Stem  [id number] DOB: 1961-07-09 DOA: 01/17/2022 ?PCP: Default, Provider, MD ? ? ?Brief Narrative: ?Zachary Arias is a 61 y.o. male with a history of combined CHF, ischemic cardiomyopathy, CAD, hypertension, CKD stage IIIa, frequent PVCs on amiodarone. Patient presented secondary to shortness of breath and LE swelling, found to have evidence of acute heart failure. IV diuresis initiated. Advanced heart failure team consulted for resultant cardiogenic shock requiring initiation of dobutamine IV. ? ? ?Assessment and Plan: ? ?Acute on chronic combined systolic and diastolic heart failure ?Cardiogenic shock ?Advanced heart failure team consulted. BNP of 2,334 on admission. Patient initially managed on Lasix IV with subsequent hypotension, requiring initiation of dobutamine. Midodrine also started. Heart failure managed with Lasix IV drip which has been transitioned to torsemide. ?-Heart failure recommendations: torsemide, dobutamine IV, midodrine ? ?Leukocytosis ?Uncertain etiology. No infectious etiology identified. Trended down. ? ?AKI on CKD stage IIIa ?Baseline creatinine is about 1.4. Peak creatinine of 1.89 this admission with slow downtrend on diuresis. Likely combination of hypotension/volume overload. ? ?Diabetes mellitus, type 2 - ruled out ?Hemoglobin A1C of 5.6%. Patient does not have a diagnosis of diabetes. Patient is on [place] for heart failure. ? ?Hypokalemia ?Potassium of 3.4 on BMP today. Likely related to diuresis ?-Kdur 40 meq x1 ? ?PVCs ?-Continue amiodarone ? ?DVT prophylaxis: Heparin subq ?Code Status:   Code Status: Full Code ?Family Communication: None at bedside ?Disposition Plan: Discharge pending heart failure team recommendations ? ? ?Consultants:  ?Cardiology/Advanced heart failure ? ?Procedures:  ?Transthoracic Echocardiogram (01/18/2022) ? ?Antimicrobials: ?None  ? ? ?Subjective: ?Patient reports no issues this morning. Has been getting up out of  bed. ? ?Objective: ?BP 104/73 (BP Location: Left Arm)   Pulse 84   Temp 98.4 ?F (36.9 ?C) (Oral)   Resp 15   Ht 5\' 7"  (1.702 m)   Wt 72.5 kg   SpO2 94%   BMI 25.03 kg/m?  ? ?Examination: ? ?General exam: Appears calm and comfortable ?Respiratory system: Clear to auscultation. Respiratory effort normal. ?Cardiovascular system: S1 & S2 heard, RRR. No murmurs, rubs, gallops or clicks. ?Gastrointestinal system: Abdomen is nondistended, soft and nontender. No organomegaly or masses felt. Normal bowel sounds heard. ?Central nervous system: Alert and oriented. No focal neurological deficits. ?Musculoskeletal: 1+ BLE edema. No calf tenderness ?Skin: No cyanosis. No rashes ?Psychiatry: Judgement and insight appear normal. Mood & affect appropriate.  ? ? ?Data Reviewed: I have personally reviewed following labs and imaging studies ? ?CBC ?Lab Results  ?Component Value Date  ? WBC 10.9 (H) 01/19/2022  ? RBC 4.65 01/19/2022  ? HGB 13.2 01/19/2022  ? HCT 40.4 01/19/2022  ? MCV 86.9 01/19/2022  ? MCH 28.4 01/19/2022  ? PLT 201 01/19/2022  ? MCHC 32.7 01/19/2022  ? RDW 15.9 (H) 01/19/2022  ? LYMPHSABS 0.5 (L) 01/18/2022  ? MONOABS 0.8 01/18/2022  ? EOSABS 0.0 01/18/2022  ? BASOSABS 0.0 01/18/2022  ? ? ? ?Last metabolic panel ?Lab Results  ?Component Value Date  ? NA 137 01/20/2022  ? K 3.4 (L) 01/20/2022  ? CL 102 01/20/2022  ? CO2 25 01/20/2022  ? BUN 24 (H) 01/20/2022  ? CREATININE 1.72 (H) 01/20/2022  ? GLUCOSE 286 (H) 01/20/2022  ? GFRNONAA 45 (L) 01/20/2022  ? GFRAA >60 05/15/2020  ? CALCIUM 8.2 (L) 01/20/2022  ? PHOS 3.1 03/06/2020  ? PROT 5.4 (L) 01/19/2022  ? ALBUMIN 2.9 (L) 01/19/2022  ? BILITOT 1.3 (H) 01/19/2022  ? ALKPHOS 45 01/19/2022  ?  AST 27 01/19/2022  ? ALT 37 01/19/2022  ? ANIONGAP 10 01/20/2022  ? ? ?GFR: ?Estimated Creatinine Clearance: 43.2 mL/min (A) (by C-G formula based on SCr of 1.72 mg/dL (H)). ? ?Recent Results (from the past 240 hour(s))  ?MRSA Next Gen by PCR, Nasal     Status: None  ?  Collection Time: 01/17/22  7:32 PM  ? Specimen: Nasal Mucosa; Nasal Swab  ?Result Value Ref Range Status  ? MRSA by PCR Next Gen NOT DETECTED NOT DETECTED Final  ?  Comment: (NOTE) ?The GeneXpert MRSA Assay (FDA approved for NASAL specimens only), ?is one component of a comprehensive MRSA colonization surveillance ?program. It is not intended to diagnose MRSA infection nor to guide ?or monitor treatment for MRSA infections. ?Test performance is not FDA approved in patients less than 2 years ?old. ?Performed at [facility], [place] [address], [place], [place] ?[address] ?  ?Culture, blood (Routine X 2) w Reflex to ID Panel     Status: None (Preliminary result)  ? Collection Time: 01/18/22 10:41 AM  ? Specimen: BLOOD  ?Result Value Ref Range Status  ? Specimen Description BLOOD RIGHT ANTECUBITAL  Final  ? Special Requests   Final  ?  BOTTLES DRAWN AEROBIC AND ANAEROBIC Blood Culture adequate volume  ? Culture   Final  ?  NO GROWTH 2 DAYS ?Performed at [facility], [place] [address] [place], [place] [address] ?  ? Report Status PENDING  Incomplete  ?Culture, blood (Routine X 2) w Reflex to ID Panel     Status: None (Preliminary result)  ? Collection Time: 01/18/22 10:45 AM  ? Specimen: BLOOD  ?Result Value Ref Range Status  ? Specimen Description BLOOD BLOOD RIGHT FOREARM  Final  ? Special Requests   Final  ?  BOTTLES DRAWN AEROBIC AND ANAEROBIC Blood Culture adequate volume  ? Culture   Final  ?  NO GROWTH 2 DAYS ?Performed at [facility], [place] [address], [place], [place] [address] ?  ? Report Status PENDING  Incomplete  ?  ? ? ?Radiology Studies: ?ECHOCARDIOGRAM COMPLETE ? ?Result Date: 01/18/2022 ?   ECHOCARDIOGRAM REPORT   Patient Name:   Zachary Arias Date of Exam: 01/18/2022 Medical Rec #:  [id number]     Height:       67.0 in Accession #:    [id number]    Weight:       181.9 lb Date of Birth:  02-26-1961     BSA:          1.942 m? Patient Age:    67 years      BP:           95/61 mmHg Patient Gender:  M             HR:           89 bpm. Exam Location:  Inpatient Procedure: 2D Echo Indications:    Acute systolic CHF  History:        Patient has prior history of Echocardiogram examinations, most                 recent 01/05/2021. CHF, CAD, Arrythmias:Tachycardia; Risk                 Factors:Hypertension.  Sonographer:    Arlyss Gandy Referring Phys: [id number] Britton  1. Left ventricular ejection fraction, by estimation, is 30 to 35%. The left ventricle has moderately decreased function. The left  ventricle demonstrates global hypokinesis. The left ventricular internal cavity size was severely dilated. Left ventricular diastolic parameters are indeterminate. Elevated left ventricular end-diastolic pressure.  2. Right ventricular systolic function is normal. The right ventricular size is mildly enlarged. There is severely elevated pulmonary artery systolic pressure. The estimated right ventricular systolic pressure is [id number] mmHg.  3. Left atrial size was severely dilated.  4. The pericardial effusion is posterior to the left ventricle.  5. The mitral valve is degenerative. Severe mitral valve regurgitation. No evidence of mitral stenosis.  6. Tricuspid valve regurgitation is moderate.  7. The aortic valve is normal in structure. Aortic valve regurgitation is trivial. No aortic stenosis is present.  8. The inferior vena cava is dilated in size with <50% respiratory variability, suggesting right atrial pressure of 15 mmHg. FINDINGS  Left Ventricle: Left ventricular ejection fraction, by estimation, is 30 to 35%. The left ventricle has moderately decreased function. The left ventricle demonstrates global hypokinesis. The left ventricular internal cavity size was severely dilated. There is no left ventricular hypertrophy. Left ventricular diastolic parameters are indeterminate. Elevated left ventricular end-diastolic pressure. Right Ventricle: The right ventricular size is mildly enlarged. No increase in  right ventricular wall thickness. Right ventricular systolic function is normal. There is severely elevated pulmonary artery systolic pressure. The tricuspid regurgitant velocity is 3.50 m/s, and with an assumed right atri

## 2022-01-20 NOTE — Progress Notes (Addendum)
? ? Advanced Heart Failure Rounding Note ? ?PCP-Cardiologist: None  ? ?Subjective:   ?4/24 Cardiogenic shock. Started on DBA 3 mcg.  Also started on midodrine. Given 80 mg IV lasix and started on lasix drip.  ? ?Brisk diuresis noted. Negative 3.7  liters. Weight down another 7 pounds. Overall weight down 23 pounds.   ? ?CO-OX 70%  ? ?Denies SOB. Feels better. Walked 540 feet.  ? ? ?Objective:   ?Weight Range: ?72.5 kg ?Body mass index is 25.03 kg/m?.  ? ?Vital Signs:   ?Temp:  [97.3 ?F (36.3 ?C)-98.4 ?F (36.9 ?C)] 98.4 ?F (36.9 ?C) (04/26 3846) ?Pulse Rate:  [81-98] 81 (04/26 0342) ?Resp:  [14-20] 15 (04/26 6599) ?BP: (81-104)/(50-73) 104/73 (04/26 3570) ?SpO2:  [93 %-100 %] 94 % (04/26 0342) ?Weight:  [72.5 kg] 72.5 kg (04/26 0500) ?Last BM Date : 01/16/22 ? ?Weight change: ?Filed Weights  ? 01/18/22 0447 01/19/22 0411 01/20/22 0500  ?Weight: 82.5 kg 75.8 kg 72.5 kg  ? ? ?Intake/Output:  ? ?Intake/Output Summary (Last 24 hours) at 01/20/2022 0806 ?Last data filed at 01/20/2022 0330 ?Gross per 24 hour  ?Intake 1392.33 ml  ?Output 5175 ml  ?Net -3782.67 ml  ?  ? ? ?Physical Exam  ? CVP 1 ?General:  In bed.  No resp difficulty ?HEENT: normal ?Neck: supple. no JVD. Carotids 2+ bilat; no bruits. No lymphadenopathy or thryomegaly appreciated. ?Cor: PMI nondisplaced. Regular rate & rhythm. No rubs, gallops or murmurs. ?Lungs: clear ?Abdomen: soft, nontender, nondistended. No hepatosplenomegaly. No bruits or masses. Good bowel sounds. ?Extremities: no cyanosis, clubbing, rash, trace edema. Ted hose on lower extremities. RUE PICC ?Neuro: alert & orientedx3, cranial nerves grossly intact. moves all 4 extremities w/o difficulty. Affect pleasant ? ? ?Telemetry  ? ?SR 80s  ? ?EKG  ?  ?N/A  ? ?Labs  ?  ?CBC ?Recent Labs  ?  01/17/22 ?1400 01/18/22 ?0106 01/19/22 ?0400  ?WBC 18.8* 12.1* 10.9*  ?NEUTROABS 16.7* 10.7*  --   ?HGB 14.8 12.2* 13.2  ?HCT 46.7 38.0* 40.4  ?MCV 90.0 88.0 86.9  ?PLT 262 206 201  ? ?Basic Metabolic  Panel ?Recent Labs  ?  01/18/22 ?0106 01/19/22 ?0400 01/19/22 ?1250  ?NA 142 136 139  ?K 4.0 2.9* 3.7  ?CL 115* 100 105  ?CO2 22 25 25   ?GLUCOSE 121* 245* 217*  ?BUN 20 20 21*  ?CREATININE 1.89* 1.82* 1.85*  ?CALCIUM 7.8* 7.8* 7.7*  ?MG 2.3 2.2  --   ? ?Liver Function Tests ?Recent Labs  ?  01/19/22 ?0400  ?AST 27  ?ALT 37  ?ALKPHOS 45  ?BILITOT 1.3*  ?PROT 5.4*  ?ALBUMIN 2.9*  ? ?No results for input(s): LIPASE, AMYLASE in the last 72 hours. ?Cardiac Enzymes ?No results for input(s): CKTOTAL, CKMB, CKMBINDEX, TROPONINI in the last 72 hours. ? ?BNP: ?BNP (last 3 results) ?Recent Labs  ?  11/19/21 ?1423 12/21/21 ?1255 01/17/22 ?1400  ?BNP 588.9* 887.4* 2,334.9*  ? ? ?ProBNP (last 3 results) ?No results for input(s): PROBNP in the last 8760 hours. ? ? ?D-Dimer ?No results for input(s): DDIMER in the last 72 hours. ?Hemoglobin A1C ?Recent Labs  ?  01/18/22 ?0106  ?HGBA1C 5.6  ? ?Fasting Lipid Panel ?No results for input(s): CHOL, HDL, LDLCALC, TRIG, CHOLHDL, LDLDIRECT in the last 72 hours. ?Thyroid Function Tests ?No results for input(s): TSH, T4TOTAL, T3FREE, THYROIDAB in the last 72 hours. ? ?Invalid input(s): FREET3 ? ?Other results: ? ? ?Imaging  ? ? ?No results found. ? ? ?  Medications:   ? ? ?Scheduled Medications: ? amiodarone  200 mg Oral BID  ? aspirin  81 mg Oral Daily  ? atorvastatin  40 mg Oral Daily  ? Chlorhexidine Gluconate Cloth  6 each Topical Daily  ? heparin  5,000 Units Subcutaneous Q8H  ? insulin aspart  0-9 Units Subcutaneous TID WC  ? midodrine  5 mg Oral TID WC  ? sodium chloride flush  10-40 mL Intracatheter Q12H  ? sodium chloride flush  3 mL Intravenous Q12H  ? ? ?Infusions: ? sodium chloride    ? DOBUTamine 3 mcg/kg/min (01/19/22 1224)  ? furosemide (LASIX) 200 mg in dextrose 5% 100 mL (2mg /mL) infusion 20 mg/hr (01/19/22 2106)  ? ? ?PRN Medications: ?sodium chloride, acetaminophen, sodium chloride, sodium chloride flush, sodium chloride flush ? ? ? ?Patient Profile  ? ?  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 Cath with 1-v CAD with chronically occluded RCA. MRI in January 2011 with EF 31% with inferior scar.  ?  ?Admitted with A/C HFrEF-->shock.  ? ?Assessment/Plan  ?1. A/C HFrEF --> Shock suspect cardiogenic. ?Cath with 1-v CAD with chronically occluded RCA. MRI in January 2011 with EF 31% with inferior scar.  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  EF 45-50% on echo in 8/13.  ?- No f/u between to 2015-2021 ?- Echo 6/21 EF 20-25% severe central MR. Repeat cath 6/21 with stable CAD ?- Echo 07/07/20 EF 30-35% ?- Echo 01/05/21 EF 40-45% mild to mod MR RV mild HK  ?- Repeat ECHO EF 30-35%.   ?- He has been off all meds for ~ 1 month. Consult SW.  ?- Started on DBA. CO-OX stable. Diuresed. CVP 1 Stop lasix drip. Start torsemide 40 mg daily.  ?- Start to wean DBA. Cut back to 1.5 mcg.   ?- Increase midodrine to 5 mg tid.   ?- Start GDMT once off DBA and midodrine.  ?- Place unna boots. ?-BMET pending.  ? ?2. CKD Stage IIIa ?-Creatinine baseline 1.4 -->admit 1.7-->1.9 ->1.8 ->pending.  ?-Renal US- no hydronephrosis.  ?  ?3. PVCs ?-Continue amio 200 mg twice a day.  May need switch to amio drip with addition of Dobutamine.  ?  ?4. CAD  ? -Cath with 1-v CAD with chronically occluded RCA ?- HS Trop 15  ?- No chest pain  ?- Continue aspirin and atorvastatin.  ?  ?5. ID ?-WBC 18.8 on admit -->12->10.9  ?-Bld CX - NGTD.  ? ?6. Hypokalemia  ?BMET pending.  ? ?Ambulate.  ?  ?Will refer to HF Paramedicine. Medicaid application started. No insurance will need meds prior to d/c. HF Fund/Match.  ? ?Length of Stay: 3 ? ?Darrick Grinder, NP  ?01/20/2022, 8:06 AM ? ?Advanced Heart Failure Team ?Pager [phone] (M-F; 7a - 5p)  ?Please contact [facility] for night-coverage after hours (5p -7a ) and weekends on [email or web address] ? ?Patient seen and examined with the above-signed Advanced Practice Provider and/or  Housestaff. I personally reviewed laboratory data, imaging studies and relevant notes. I independently examined the patient and formulated the important aspects of the plan. I have edited the note to reflect any of my changes or salient points. I have personally discussed the plan with the patient and/or family. ? ?Has diuresed over 23 pounds. Breathing better. CVP low. Co-ox ok on dobutamine 3. BP still soft.  ? ?General:  Lying in bed  No resp  difficulty ?HEENT: normal ?Neck: supple. no JVD. Carotids 2+ bilat; no bruits. No lymphadenopathy or thryomegaly appreciated. ?Cor: PMI nondisplaced. Regular rate & rhythm. No rubs, gallops or murmurs. ?Lungs: clear ?Abdomen: soft, nontender, nondistended. No hepatosplenomegaly. No bruits or masses. Good bowel sounds. ?Extremities: no cyanosis, clubbing, rash, edema ?Neuro: alert & orientedx3, cranial nerves grossly intact. moves all 4 extremities w/o difficulty. Affect pleasant ? ?He has been fully diuresed. Will wean DBA down. Hold diuretics today. Increase midodrine.  ? ?Glori Bickers, MD  ?4:53 PM ? ? ? ? ?

## 2022-01-20 NOTE — Progress Notes (Signed)
Physical Therapy Treatment and Discharge ?Patient Details ?Name: Zachary Arias ?MRN: [id number] ?DOB: 1961/01/10 ?Today's Date: 01/20/2022 ? ? ?History of Present Illness Pt is a 61 y.o. M who presents 01/17/2022 with worsening SOB and lower extremity swelling. Admitted with acute systolic herat failure/cardiogenic shock. 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 ? ?  ?PT Comments  ? ? Pt overall is mobilizing well. Ambulating 650 ft with no assistive device modI, HR 90-101, BP 98/65 (74) post mobility. Pt denies dizziness, reports feeling a little "hazy." Pt negotiated 4 steps with bilateral railings without physical assist. Encouraged continued ambulation while inpatient. No further acute PT needs. Thank you for this consult. ?   ?Recommendations for follow up therapy are one component of a multi-disciplinary discharge planning process, led by the attending physician.  Recommendations may be updated based on patient status, additional functional criteria and insurance authorization. ? ?Follow Up Recommendations ? No PT follow up ?  ?  ?Assistance Recommended at Discharge PRN  ?Patient can return home with the following Help with stairs or ramp for entrance ?  ?Equipment Recommendations ? None recommended by PT  ?  ?Recommendations for Other Services   ? ? ?  ?Precautions / Restrictions Precautions ?Precautions: None ?Restrictions ?Weight Bearing Restrictions: No  ?  ? ?Mobility ? Bed Mobility ?Overal bed mobility: Independent ?  ?  ?  ?  ?  ?  ?  ?  ? ?Transfers ?Overall transfer level: Independent ?Equipment used: None ?  ?  ?  ?  ?  ?  ?  ?  ?  ? ?Ambulation/Gait ?Ambulation/Gait assistance: Modified independent (Device/Increase time) ?Gait Distance (Feet): 650 Feet ?Assistive device: None ?Gait Pattern/deviations: Step-through pattern, Decreased stride length, Wide base of support, Decreased dorsiflexion - right ?Gait  velocity: decreased ?  ?  ?General Gait Details: Wider BOS, slow speed for age, modI overall ? ? ?Stairs ?Stairs: Yes ?Stairs assistance: Supervision ?Stair Management: Two rails ?Number of Stairs: 4 ?General stair comments: Utilizing wider BOS, step over step pattern, supervision for safety ? ? ?Wheelchair Mobility ?  ? ?Modified Rankin (Stroke Patients Only) ?  ? ? ?  ?Balance Overall balance assessment: Mild deficits observed, not formally tested ?  ?  ?  ?  ?  ?  ?  ?  ?  ?  ?  ?  ?  ?  ?  ?  ?  ?  ?  ? ?  ?Cognition Arousal/Alertness: Awake/alert ?Behavior During Therapy: [facility] for tasks assessed/performed ?Overall Cognitive Status: Within Functional Limits for tasks assessed ?  ?  ?  ?  ?  ?  ?  ?  ?  ?  ?  ?  ?  ?  ?  ?  ?  ?  ?  ? ?  ?Exercises   ? ?  ?General Comments   ?  ?  ? ?Pertinent Vitals/Pain Pain Assessment ?Pain Assessment: Faces ?Faces Pain Scale: No hurt  ? ? ?Home Living   ?  ?  ?  ?  ?  ?  ?  ?  ?  ?   ?  ?Prior Function    ?  ?  ?   ? ?PT Goals (current goals can now be found in the care plan section) Acute Rehab PT Goals ?Patient Stated Goal: did not state ?PT Goal Formulation: With patient ?Time For Goal Achievement: 02/02/22 ?Potential to Achieve Goals: Good ?  Progress towards PT goals: Goals met/education completed, patient discharged from PT ? ?  ?Frequency ? ? ? Min 3X/week ? ? ? ?  ?PT Plan Current plan remains appropriate  ? ? ?Co-evaluation   ?  ?  ?  ?  ? ?  ?AM-PAC PT "6 Clicks" Mobility   ?Outcome Measure ? Help needed turning from your back to your side while in a flat bed without using bedrails?: None ?Help needed moving from lying on your back to sitting on the side of a flat bed without using bedrails?: None ?Help needed moving to and from a bed to a chair (including a wheelchair)?: None ?Help needed standing up from a chair using your arms (e.g., wheelchair or bedside chair)?: None ?Help needed to walk in hospital room?: None ?Help needed climbing 3-5 steps with a railing? : A  Little ?6 Click Score: 23 ? ?  ?End of Session   ?Activity Tolerance: Patient tolerated treatment well ?Patient left: in chair;with call bell/phone within reach ?Nurse Communication: Mobility status ?PT Visit Diagnosis: Other abnormalities of gait and mobility (R26.89);Difficulty in walking, not elsewhere classified (R26.2) ?  ? ? ?Time: 8099-8338 ?PT Time Calculation (min) (ACUTE ONLY): 15 min ? ?Charges:  $Therapeutic Activity: 8-22 mins          ?          ? ?Zachary Arias, PT, DPT ?Acute Rehabilitation Services ?Pager [phone] ?Office [phone] ? ? ? ?Zachary Arias ?01/20/2022, 10:46 AM ? ?

## 2022-01-20 NOTE — Progress Notes (Signed)
?  Mobility Specialist Criteria Algorithm Info. ? ? ? 01/20/22 1605  ?Mobility  ?Activity Ambulated with assistance in hallway  ?Range of Motion/Exercises Active;All extremities  ?Level of Assistance Modified independent, requires aide device or extra time  ?Assistive Device Other (Comment) ?(IV Pole)  ?Distance Ambulated (ft) 500 ft  ?Activity Response Tolerated well  ? ?Patient received in recliner chair. Ambulated in hallway mod I with steady gait. Tolerated ambulation well without complaint or incident. Was left in recliner with all needs met, call bell in reach. ? ?01/20/2022 ?4:31 PM ? ?[place] Dotsie Gillette, CMS, BS EXP ?Acute Rehabilitation Services  ?TEIHD:[phone] ?Office: [phone] ? ?

## 2022-01-21 LAB — BASIC METABOLIC PANEL
Anion gap: 8 (ref 5–15)
BUN: 24 mg/dL — ABNORMAL HIGH (ref 6–20)
CO2: 26 mmol/L (ref 22–32)
Calcium: 7.8 mg/dL — ABNORMAL LOW (ref 8.9–10.3)
Chloride: 105 mmol/L (ref 98–111)
Creatinine, Ser: 1.35 mg/dL — ABNORMAL HIGH (ref 0.61–1.24)
GFR, Estimated: >60 mL/min (ref >60)
Glucose, Bld: 121 mg/dL — ABNORMAL HIGH (ref 70–99)
Potassium: 3.7 mmol/L (ref 3.5–5.1)
Sodium: 139 mmol/L (ref 135–145)

## 2022-01-21 LAB — COOXEMETRY PANEL
Carboxyhemoglobin: 1.9 % — ABNORMAL HIGH (ref 0.5–1.5)
Methemoglobin: <0.7 % (ref 0.0–1.5)
O2 Saturation: 70.7 %
Total hemoglobin: 13.6 g/dL (ref 12.0–16.0)

## 2022-01-21 LAB — GLUCOSE, CAPILLARY
Glucose-Capillary: 96 mg/dL (ref 70–99)
Glucose-Capillary: 95 mg/dL (ref 70–99)
Glucose-Capillary: 144 mg/dL — ABNORMAL HIGH (ref 70–99)
Glucose-Capillary: 142 mg/dL — ABNORMAL HIGH (ref 70–99)

## 2022-01-21 NOTE — Plan of Care (Signed)

## 2022-01-21 NOTE — Progress Notes (Signed)
Mobility Specialist Progress Note: ? ? 01/21/22 1000  ?Mobility  ?Activity Ambulated with assistance in hallway  ?Level of Assistance Modified independent, requires aide device or extra time  ?Assistive Device Other (Comment) ?(IV Pole)  ?Distance Ambulated (ft) 500 ft  ?Activity Response Tolerated well  ?$Mobility charge 1 Mobility  ? ?Pt agreeable to mobility session this am. Required no physical assistance throughout. Pt back in bed with all needs met.  ? ?Nelta Numbers ?Acute Rehab ?Phone: [phone] ?Office Phone: [phone] ? ?

## 2022-01-21 NOTE — Progress Notes (Signed)
Took over pt care from Farley Ly, Therapist, sports. Pt A&O resting in bed. ?

## 2022-01-21 NOTE — Progress Notes (Addendum)
? ? Advanced Heart Failure Rounding Note ? ?PCP-Cardiologist: None  ? ?Subjective:   ?4/24 Cardiogenic shock. Started on DBA 3 mcg.  Also started on midodrine. Given 80 mg IV lasix and started on lasix drip. Diuresed 20+ lb.  ? ?Tolerating DBA wean. Currently on 1.5 mcg/kg/min. Co-ox 71%. SCr continues to improve, 1.85>>1.72>>1.35. CVP 7   ? ?BP ok w/ midodrine, low [id number] systolic.  ? ?Feeling better. Breathing improved. Sitting up in bed. No complaints.  ? ?Objective:   ?Weight Range: ?73.1 kg ?Body mass index is 25.24 kg/m?.  ? ?Vital Signs:   ?Temp:  [98 ?F (36.7 ?C)-99 ?F (37.2 ?C)] 98 ?F (36.7 ?C) (04/27 [id number]) ?Pulse Rate:  [81-93] 88 (04/27 0742) ?Resp:  [14-20] 20 (04/27 0742) ?BP: (92-103)/(61-71) 100/67 (04/27 0742) ?SpO2:  [98 %-100 %] 100 % (04/27 0742) ?Weight:  [73.1 kg] 73.1 kg (04/27 0423) ?Last BM Date : 01/20/22 ? ?Weight change: ?Filed Weights  ? 01/19/22 0411 01/20/22 0500 01/21/22 0423  ?Weight: 75.8 kg 72.5 kg 73.1 kg  ? ? ?Intake/Output:  ? ?Intake/Output Summary (Last 24 hours) at 01/21/2022 0937 ?Last data filed at 01/21/2022 0745 ?Gross per 24 hour  ?Intake 660 ml  ?Output 1325 ml  ?Net -665 ml  ?  ? ? ?Physical Exam  ? ?CVP 6-7  ?General:  Well appearing. No respiratory difficulty ?HEENT: normal ?Neck: supple. JVD 7 cm Carotids 2+ bilat; no bruits. No lymphadenopathy or thyromegaly appreciated. ?Cor: PMI nondisplaced. Regular rate & rhythm. No rubs, gallops or murmurs. ?Lungs: clear ?Abdomen: soft, nontender, nondistended. No hepatosplenomegaly. No bruits or masses. Good bowel sounds. ?Extremities: no cyanosis, clubbing, rash, edema + RUE PICC  ?Neuro: alert & oriented x 3, cranial nerves grossly intact. moves all 4 extremities w/o difficulty. Affect pleasant. ? ?Telemetry  ? ?Sinus tach, low 100s  ? ?EKG  ?  ?N/A  ? ?Labs  ?  ?CBC ?Recent Labs  ?  01/19/22 ?0400  ?WBC 10.9*  ?HGB 13.2  ?HCT 40.4  ?MCV 86.9  ?PLT 201  ? ?Basic Metabolic Panel ?Recent Labs  ?  01/19/22 ?0400 01/19/22 ?1250  01/20/22 ?0822 01/21/22 ?[id number]  ?NA 136   < > 137 139  ?K 2.9*   < > 3.4* 3.7  ?CL 100   < > 102 105  ?CO2 25   < > 25 26  ?GLUCOSE 245*   < > 286* 121*  ?BUN 20   < > 24* 24*  ?CREATININE 1.82*   < > 1.72* 1.35*  ?CALCIUM 7.8*   < > 8.2* 7.8*  ?MG 2.2  --   --   --   ? < > = values in this interval not displayed.  ? ?Liver Function Tests ?Recent Labs  ?  01/19/22 ?0400  ?AST 27  ?ALT 37  ?ALKPHOS 45  ?BILITOT 1.3*  ?PROT 5.4*  ?ALBUMIN 2.9*  ? ?No results for input(s): LIPASE, AMYLASE in the last 72 hours. ?Cardiac Enzymes ?No results for input(s): CKTOTAL, CKMB, CKMBINDEX, TROPONINI in the last 72 hours. ? ?BNP: ?BNP (last 3 results) ?Recent Labs  ?  11/19/21 ?1423 12/21/21 ?1255 01/17/22 ?1400  ?BNP 588.9* 887.4* 2,334.9*  ? ? ?ProBNP (last 3 results) ?No results for input(s): PROBNP in the last 8760 hours. ? ? ?D-Dimer ?No results for input(s): DDIMER in the last 72 hours. ?Hemoglobin A1C ?No results for input(s): HGBA1C in the last 72 hours. ? ?Fasting Lipid Panel ?No results for input(s): CHOL, HDL, LDLCALC, TRIG, CHOLHDL, LDLDIRECT in  the last 72 hours. ?Thyroid Function Tests ?No results for input(s): TSH, T4TOTAL, T3FREE, THYROIDAB in the last 72 hours. ? ?Invalid input(s): FREET3 ? ?Other results: ? ? ?Imaging  ? ? ?No results found. ? ? ?Medications:   ? ? ?Scheduled Medications: ? amiodarone  200 mg Oral BID  ? aspirin  81 mg Oral Daily  ? atorvastatin  40 mg Oral Daily  ? Chlorhexidine Gluconate Cloth  6 each Topical Daily  ? heparin  5,000 Units Subcutaneous Q8H  ? insulin aspart  0-9 Units Subcutaneous TID WC  ? midodrine  10 mg Oral TID WC  ? sodium chloride flush  10-40 mL Intracatheter Q12H  ? sodium chloride flush  3 mL Intravenous Q12H  ? torsemide  40 mg Oral Daily  ? ? ?Infusions: ? sodium chloride    ? DOBUTamine 1.5 mcg/kg/min (01/20/22 1013)  ? ? ?PRN Medications: ?sodium chloride, acetaminophen, sodium chloride, sodium chloride flush, sodium chloride flush ? ? ? ?Patient Profile  ? ?  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 Cath with 1-v CAD with chronically occluded RCA. MRI in January 2011 with EF 31% with inferior scar.  ?  ?Admitted with A/C HFrEF-->shock.  ? ?Assessment/Plan  ?1. A/C HFrEF --> Shock suspect cardiogenic. ?Cath with 1-v CAD with chronically occluded RCA. MRI in January 2011 with EF 31% with inferior scar.  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  EF 45-50% on echo in 8/13.  ?- No f/u between to 2015-2021 ?- Echo 6/21 EF 20-25% severe central MR. Repeat cath 6/21 with stable CAD ?- Echo 07/07/20 EF 30-35% ?- Echo 01/05/21 EF 40-45% mild to mod MR RV mild HK  ?- Repeat ECHO EF 30-35%.   ?- He has been off all meds for ~ 1 month. Consult SW.  ?- Started on DBA and lasix gtt w/ 20 lb diuresis. Now back on PO torsemide 40 mg daily  ?- Co-ox 71% on DBA 1.5, CVP 7  ?- Stop DBA today  ?- GDMT limited by low BP. Continue midodrine for BP support  ?- BMET pending.  ? ?2. CKD Stage IIIa ?-Creatinine baseline 1.4 -->admit 1.7-->1.9 ->1.8-1.35   ?-Renal US- no hydronephrosis.  ?  ?3. PVCs ?-Continue amio 200 mg twice a day.   ?  ?4. CAD  ? -Cath with 1-v CAD with chronically occluded RCA ?- HS Trop 15  ?- No chest pain  ?- Continue aspirin and atorvastatin.  ?  ?5. ID ?-WBC 18.8 on admit -->12->10.9  ?-Bld CX - NGTD.  ? ?6. Hypokalemia  ?K 3.7 today  ? ? ?Will refer to HF Paramedicine. Medicaid application started. No insurance will need meds prior to d/c. HF Fund/Match.  ? ?Length of Stay: 4 ? ?Lyda Jester, PA-C  ?01/21/2022, 9:37 AM ? ?Advanced Heart Failure Team ?Pager [phone] (M-F; 7a - 5p)  ?Please contact [facility] for night-coverage after hours (5p -7a ) and weekends on [email or web address] ? ?Patient seen and examined with the above-signed Advanced Practice Provider and/or Housestaff. I personally reviewed laboratory data, imaging studies and relevant notes. I  independently examined the patient and formulated the important aspects of the plan. I have edited the note to reflect any of my changes or salient points. I have personally discussed the plan with the patient and/or family. ? ?Remains on DBA. Co-ox and volume status stable. Scr stable ? ?General:  Sitting up  in bed . No resp difficulty ?HEENT: normal ?Neck: supple. no JVD. Carotids 2+ bilat; no bruits. No lymphadenopathy or thryomegaly appreciated. ?Cor: Regular rate & rhythm. No rubs, gallops or murmurs. ?Lungs: clear ?Abdomen: soft, nontender, nondistended. No hepatosplenomegaly. No bruits or masses. Good bowel sounds. ?Extremities: no cyanosis, clubbing, rash, edema ?Neuro: alert & orientedx3, cranial nerves grossly intact. moves all 4 extremities w/o difficulty. Affect pleasant ? ?Much improved. Stop DBA today. Continue midodrine for BP support. If co-ox stable in am can d/c home.  ? ?Glori Bickers, MD  ?10:16 AM ? ? ? ? ? ? ?

## 2022-01-21 NOTE — Progress Notes (Signed)
? ?PROGRESS NOTE ? ? ? ?Zachary Arias  [id number] DOB: Feb 07, 1961 DOA: 01/17/2022 ?PCP: Default, Provider, MD ? ? ?Brief Narrative: ?Zachary Arias is a 61 y.o. male with a history of combined CHF, ischemic cardiomyopathy, CAD, hypertension, CKD stage IIIa, frequent PVCs on amiodarone. Patient presented secondary to shortness of breath and LE swelling, found to have evidence of acute heart failure. IV diuresis initiated. Advanced heart failure team consulted for resultant cardiogenic shock requiring initiation of dobutamine IV. ? ? ?Assessment and Plan: ? ?Acute on chronic combined systolic and diastolic heart failure ?Cardiogenic shock ?Advanced heart failure team consulted. BNP of 2,334 on admission. Patient initially managed on Lasix IV with subsequent hypotension, requiring initiation of dobutamine. Midodrine also started. Heart failure managed with Lasix IV drip which has been transitioned to torsemide. ?-Heart failure recommendations: torsemide, discontinue dobutamine IV, midodrine ? ?Leukocytosis ?Uncertain etiology. No infectious etiology identified. Trended down. ? ?AKI on CKD stage IIIa ?Baseline creatinine is about 1.4. Peak creatinine of 1.89 this admission with slow downtrend on diuresis. Likely combination of hypotension/volume overload. ? ?Hypokalemia ?Potassium of 3.4 on BMP 4/26. Likely related to diuresis. Resolved with repletion and transition off of IV Lasix. ? ?PVCs ?-Continue amiodarone ? ?DVT prophylaxis: Heparin subq ?Code Status:   Code Status: Full Code ?Family Communication: None at bedside ?Disposition Plan: Discharge pending heart failure team recommendations ? ? ?Consultants:  ?Cardiology/Advanced heart failure ? ?Procedures:  ?Transthoracic Echocardiogram (01/18/2022) ? ?Antimicrobials: ?None  ? ? ?Subjective: ?No issues overnight. Breathing well. ? ?Objective: ?BP 100/67 (BP Location: Left Arm)   Pulse 88   Temp 98 ?F (36.7 ?C) (Oral)   Resp 20   Ht 5\' 7"  (1.702 m)   Wt 73.1 kg    SpO2 100%   BMI 25.24 kg/m?  ? ?Examination: ? ?General exam: Appears calm and comfortable ?Respiratory system: Clear to auscultation. Respiratory effort normal. ?Cardiovascular system: S1 & S2 heard, RRR. ?Gastrointestinal system: Abdomen is nondistended, soft and nontender. Normal bowel sounds heard. ?Central nervous system: Alert and oriented. No focal neurological deficits. ?Musculoskeletal: 1+ BLE edema mostly around ankles. No calf tenderness ?Skin: No cyanosis. No rashes ?Psychiatry: Judgement and insight appear normal. Mood & affect appropriate.  ? ? ?Data Reviewed: I have personally reviewed following labs and imaging studies ? ?CBC ?Lab Results  ?Component Value Date  ? WBC 10.9 (H) 01/19/2022  ? RBC 4.65 01/19/2022  ? HGB 13.2 01/19/2022  ? HCT 40.4 01/19/2022  ? MCV 86.9 01/19/2022  ? MCH 28.4 01/19/2022  ? PLT 201 01/19/2022  ? MCHC 32.7 01/19/2022  ? RDW 15.9 (H) 01/19/2022  ? LYMPHSABS 0.5 (L) 01/18/2022  ? MONOABS 0.8 01/18/2022  ? EOSABS 0.0 01/18/2022  ? BASOSABS 0.0 01/18/2022  ? ? ? ?Last metabolic panel ?Lab Results  ?Component Value Date  ? NA 139 01/21/2022  ? K 3.7 01/21/2022  ? CL 105 01/21/2022  ? CO2 26 01/21/2022  ? BUN 24 (H) 01/21/2022  ? CREATININE 1.35 (H) 01/21/2022  ? GLUCOSE 121 (H) 01/21/2022  ? GFRNONAA >60 01/21/2022  ? GFRAA >60 05/15/2020  ? CALCIUM 7.8 (L) 01/21/2022  ? PHOS 3.1 03/06/2020  ? PROT 5.4 (L) 01/19/2022  ? ALBUMIN 2.9 (L) 01/19/2022  ? BILITOT 1.3 (H) 01/19/2022  ? ALKPHOS 45 01/19/2022  ? AST 27 01/19/2022  ? ALT 37 01/19/2022  ? ANIONGAP 8 01/21/2022  ? ? ?GFR: ?Estimated Creatinine Clearance: 55.1 mL/min (A) (by C-G formula based on SCr of 1.35 mg/dL (H)). ? ?Recent Results (from  the past 240 hour(s))  ?MRSA Next Gen by PCR, Nasal     Status: None  ? Collection Time: 01/17/22  7:32 PM  ? Specimen: Nasal Mucosa; Nasal Swab  ?Result Value Ref Range Status  ? MRSA by PCR Next Gen NOT DETECTED NOT DETECTED Final  ?  Comment: (NOTE) ?The GeneXpert MRSA Assay  (FDA approved for NASAL specimens only), ?is one component of a comprehensive MRSA colonization surveillance ?program. It is not intended to diagnose MRSA infection nor to guide ?or monitor treatment for MRSA infections. ?Test performance is not FDA approved in patients less than 2 years ?old. ?Performed at [facility], [place] [address] [place], [place] ?[address] ?  ?Culture, blood (Routine X 2) w Reflex to ID Panel     Status: None (Preliminary result)  ? Collection Time: 01/18/22 10:41 AM  ? Specimen: BLOOD  ?Result Value Ref Range Status  ? Specimen Description BLOOD RIGHT ANTECUBITAL  Final  ? Special Requests   Final  ?  BOTTLES DRAWN AEROBIC AND ANAEROBIC Blood Culture adequate volume  ? Culture   Final  ?  NO GROWTH 3 DAYS ?Performed at [facility], [place] [address], [place], [place] [address] ?  ? Report Status PENDING  Incomplete  ?Culture, blood (Routine X 2) w Reflex to ID Panel     Status: None (Preliminary result)  ? Collection Time: 01/18/22 10:45 AM  ? Specimen: BLOOD  ?Result Value Ref Range Status  ? Specimen Description BLOOD BLOOD RIGHT FOREARM  Final  ? Special Requests   Final  ?  BOTTLES DRAWN AEROBIC AND ANAEROBIC Blood Culture adequate volume  ? Culture   Final  ?  NO GROWTH 3 DAYS ?Performed at [facility], [place] [address] [place], [place] [address] ?  ? Report Status PENDING  Incomplete  ?  ? ? ?Radiology Studies: ?No results found. ? ? ? LOS: 4 days  ? ? ?Cordelia Poche, MD ?[facility] ?01/21/2022, 10:56 AM ? ? ?If 7PM-7AM, please contact night-coverage ?[email or web address] ? ?

## 2022-01-21 NOTE — Progress Notes (Signed)
?Heart and Vascular Care Navigation ? ?01/21/2022 ? ?Zachary Arias ?10-14-1960 ?[id number] ? ?Reason for Referral: CSW consulted to enroll pt in community paramedicine ?  ?Engaged with patient face to face for initial visit for Heart and Vascular Care Coordination. ?                                                                                                  ?Assessment:    Pt has been part of paramedicine program before- thinks that it was very helpful for him and is agreeable to getting re-enrolled ? ?Paramedicine Initial Assessment: ? ?Housing:  ?In what kind of housing do you live? House/apt/trailer/shelter? apartment ? ?Do you rent/pay a mortgage/own? rent ? ?Do you live with anyone? no ? ?Are you currently worried about losing your housing? A little worried about being able to afford housing moving forward.  Reports recent rent increase and now he is responsible for paying $1,137/month- states he is basically living paycheck to paycheck ? ?Social:  ?Do you have family or friends who live locally? Reports a brother who lives in [place] and his father ? ?Food:  ?Reports that he takes money away from buying food in order to make sure he pays for utilities/rent ? ?Income:  ?What is your current source of income? Reported he gets disability- after chart review looks like he gets disability from long term disability insurance ? ? ?Insurance:  ?Are you currently insured? No- states he was told he could get Medicare in November (I assume after he had had disability for 2 years) ? ?Do you have prescription coverage? ? ?If no insurance, have you applied for coverage (Medicaid, disability, marketplace etc)? [employer] following pt and assisting with medicaid application ? ?Transportation:  ?Do you have transportation to your medical appointments? Usually gets to appts by getting an uber- sometimes hard to afford. ? ?Informed pt we can help with transportation if needed. ? ? Daily Health Needs: ?Do you have a working  scale at home? yes ? ?How do you manage your medications at home? Fills his own pillbox ? ?Do you ever take your medications differently than prescribed? Reports no ? ?Do you have issues affording your medications? No- gets meds through HF fund ? ?Do you have any concerns with mobility at home? Reports no ? ?Do you use any assistive devices at home or have PCS at home? no ? ?Do you have a PCP? No- set up with [facility] appt on 5/2 ? ?Do you have any trouble reading or writing? no ? ?Are there any additional barriers you see to getting the care you need? no ? ?                                ? ?HRT/VAS Care Coordination   ? ? Outpatient Care Team Social Worker  ? Community Paramedic Name: [facility] 06/18/20 as pt no longer in need of assistance  ? Social Worker Name: Rosetta Posner- [facility]- [phone]  ? Living arrangements for the past 2 months Apartment  ?  Lives with: Self  ? Patient Current Insurance Coverage Self-Pay; Medicaid Pending  ? Does Patient Have Prescription Coverage? No  ? Patient Prescription Assistance Programs Heart Failure Fund  ? Other Assistance Programs Medications $10 copay card provided for Entresto  ? Home Assistive Devices/Equipment None  ? ?  ? ? ?Social History:                                                                             ?SDOH Screenings  ? ?Alcohol Screen: Not on file  ?Depression (PHQ2-9): Not on file  ?Financial Resource Strain: High Risk  ? Difficulty of Paying Living Expenses: Hard  ?Food Insecurity: Food Insecurity Present  ? Worried About Programme researcher, broadcasting/film/video in the Last Year: Sometimes true  ? Ran Out of Food in the Last Year: Sometimes true  ?Housing: Low Risk   ? Last Housing Risk Score: 0  ?Physical Activity: Not on file  ?Social Connections: Not on file  ?Stress: Not on file  ?Tobacco Use: Low Risk   ? Smoking Tobacco Use: Never  ? Smokeless Tobacco Use: Never  ? Passive Exposure: Not on file  ?Transportation Needs: Unmet Transportation Needs  ? Lack of  Transportation (Medical): Yes  ? Lack of Transportation (Non-Medical): Yes  ? ? ?SDOH Interventions: ?Financial Resources:    ?Getting SSDI $1,800/month  ?Food Insecurity:  Food Insecurity Interventions: Assist with SNAP Application- instructed pt to apply for SNAP with DHHS  ?Housing Insecurity:  Housing Interventions: Intervention Not Indicated  ?Transportation:    No car- utilizes uber  ? ?Follow-up plan:   ? ?Referral sent out to paramedics for assignment and follow up ? ?Burna Sis, LCSW ?Clinical Social Worker ?[facility] ?Desk#: [phone] ?Cell#: [phone] ? ? ? ?

## 2022-01-21 NOTE — Plan of Care (Signed)
?  Problem: Education: ?Goal: Knowledge of General Education information will improve ?Description: Including pain rating scale, medication(s)/side effects and non-pharmacologic comfort measures ?01/21/2022 0401 by Lennice Sites, RN ?Outcome: Progressing ?01/21/2022 0401 by Lennice Sites, RN ?Outcome: Progressing ?  ?Problem: Health Behavior/Discharge Planning: ?Goal: Ability to manage health-related needs will improve ?01/21/2022 0401 by Lennice Sites, RN ?Outcome: Progressing ?01/21/2022 0401 by Lennice Sites, RN ?Outcome: Progressing ?  ?Problem: Clinical Measurements: ?Goal: Ability to maintain clinical measurements within normal limits will improve ?01/21/2022 0401 by Lennice Sites, RN ?Outcome: Progressing ?01/21/2022 0401 by Lennice Sites, RN ?Outcome: Progressing ?Goal: Will remain free from infection ?01/21/2022 0401 by Lennice Sites, RN ?Outcome: Progressing ?01/21/2022 0401 by Lennice Sites, RN ?Outcome: Progressing ?Goal: Diagnostic test results will improve ?Outcome: Progressing ?Goal: Respiratory complications will improve ?01/21/2022 0401 by Lennice Sites, RN ?Outcome: Progressing ?01/21/2022 0401 by Lennice Sites, RN ?Outcome: Progressing ?Goal: Cardiovascular complication will be avoided ?Outcome: Progressing ?  ?Problem: Activity: ?Goal: Risk for activity intolerance will decrease ?01/21/2022 0401 by Lennice Sites, RN ?Outcome: Progressing ?01/21/2022 0401 by Lennice Sites, RN ?Outcome: Progressing ?  ?Problem: Nutrition: ?Goal: Adequate nutrition will be maintained ?01/21/2022 0401 by Lennice Sites, RN ?Outcome: Progressing ?01/21/2022 0401 by Lennice Sites, RN ?Outcome: Progressing ?  ?Problem: Coping: ?Goal: Level of anxiety will decrease ?01/21/2022 0401 by Lennice Sites, RN ?Outcome: Progressing ?01/21/2022 0401 by Lennice Sites, RN ?Outcome: Progressing ?  ?Problem: Elimination: ?Goal: Will not experience complications related to bowel motility ?Outcome: Progressing ?Goal: Will not experience  complications related to urinary retention ?Outcome: Progressing ?  ?Problem: Pain Managment: ?Goal: General experience of comfort will improve ?Outcome: Progressing ?  ?Problem: Safety: ?Goal: Ability to remain free from injury will improve ?Outcome: Progressing ?  ?Problem: Skin Integrity: ?Goal: Risk for impaired skin integrity will decrease ?Outcome: Progressing ?  ?

## 2022-01-22 ENCOUNTER — Encounter (HOSPITAL_COMMUNITY): Payer: Self-pay

## 2022-01-22 ENCOUNTER — Other Ambulatory Visit (HOSPITAL_COMMUNITY): Payer: Self-pay

## 2022-01-22 LAB — COOXEMETRY PANEL
Carboxyhemoglobin: 1.2 % (ref 0.5–1.5)
Carboxyhemoglobin: 2.1 % — ABNORMAL HIGH (ref 0.5–1.5)
Methemoglobin: <0.7 % (ref 0.0–1.5)
Methemoglobin: <0.7 % (ref 0.0–1.5)
O2 Saturation: 53.3 %
O2 Saturation: 65.8 %
Total hemoglobin: 13.6 g/dL (ref 12.0–16.0)
Total hemoglobin: 12.5 g/dL (ref 12.0–16.0)

## 2022-01-22 LAB — BASIC METABOLIC PANEL
Anion gap: 6 (ref 5–15)
BUN: 24 mg/dL — ABNORMAL HIGH (ref 6–20)
CO2: 24 mmol/L (ref 22–32)
Calcium: 8 mg/dL — ABNORMAL LOW (ref 8.9–10.3)
Chloride: 105 mmol/L (ref 98–111)
Creatinine, Ser: 1.26 mg/dL — ABNORMAL HIGH (ref 0.61–1.24)
GFR, Estimated: >60 mL/min (ref >60)
Glucose, Bld: 124 mg/dL — ABNORMAL HIGH (ref 70–99)
Potassium: 3.5 mmol/L (ref 3.5–5.1)
Sodium: 135 mmol/L (ref 135–145)

## 2022-01-22 LAB — GLUCOSE, CAPILLARY
Glucose-Capillary: 96 mg/dL (ref 70–99)
Glucose-Capillary: 87 mg/dL (ref 70–99)
Glucose-Capillary: 139 mg/dL — ABNORMAL HIGH (ref 70–99)

## 2022-01-22 LAB — CBC
HCT: 40.9 % (ref 39.0–52.0)
Hemoglobin: 13.3 g/dL (ref 13.0–17.0)
MCH: 28.4 pg (ref 26.0–34.0)
MCHC: 32.5 g/dL (ref 30.0–36.0)
MCV: 87.2 fL (ref 80.0–100.0)
Platelets: 249 10*3/uL (ref 150–400)
RBC: 4.69 MIL/uL (ref 4.22–5.81)
RDW: 15.3 % (ref 11.5–15.5)
WBC: 8.9 10*3/uL (ref 4.0–10.5)
nRBC: 0 % (ref 0.0–0.2)

## 2022-01-22 LAB — URIC ACID: Uric Acid, Serum: 4.2 mg/dL (ref 3.7–8.6)

## 2022-01-22 MED ORDER — FUROSEMIDE 10 MG/ML IJ SOLN
80.0000 mg | Freq: Once | INTRAMUSCULAR | Status: AC
  Administered 2022-01-22: 80 mg via INTRAVENOUS
  Filled 2022-01-22: qty 8

## 2022-01-22 MED ORDER — PREDNISONE 20 MG PO TABS
40.0000 mg | ORAL_TABLET | Freq: Once | ORAL | Status: AC
  Administered 2022-01-22: 40 mg via ORAL
  Filled 2022-01-22: qty 2

## 2022-01-22 MED ORDER — POTASSIUM CHLORIDE CRYS ER 20 MEQ PO TBCR
40.0000 meq | EXTENDED_RELEASE_TABLET | Freq: Once | ORAL | Status: AC
  Administered 2022-01-22: 40 meq via ORAL
  Filled 2022-01-22: qty 2

## 2022-01-22 MED ORDER — MIDODRINE HCL 10 MG PO TABS
10.0000 mg | ORAL_TABLET | Freq: Three times a day (TID) | ORAL | 11 refills | Status: DC
  Filled 2022-01-22: qty 90, 30d supply, fill #0

## 2022-01-22 MED ORDER — DAPAGLIFLOZIN PROPANEDIOL 10 MG PO TABS
10.0000 mg | ORAL_TABLET | Freq: Every day | ORAL | 11 refills | Status: DC
  Filled 2022-01-22 – 2022-02-26 (×2): qty 30, 30d supply, fill #0

## 2022-01-22 MED ORDER — DAPAGLIFLOZIN PROPANEDIOL 10 MG PO TABS
10.0000 mg | ORAL_TABLET | Freq: Every day | ORAL | Status: DC
  Administered 2022-01-22 – 2022-02-04 (×14): 10 mg via ORAL
  Filled 2022-01-22 (×14): qty 1

## 2022-01-22 MED ORDER — TORSEMIDE 20 MG PO TABS
60.0000 mg | ORAL_TABLET | Freq: Every day | ORAL | 11 refills | Status: DC
  Filled 2022-01-22: qty 90, 30d supply, fill #0

## 2022-01-22 MED ORDER — DIGOXIN 125 MCG PO TABS
0.0625 mg | ORAL_TABLET | Freq: Every day | ORAL | Status: DC
  Administered 2022-01-22 – 2022-02-04 (×14): 0.0625 mg via ORAL
  Filled 2022-01-22 (×14): qty 1

## 2022-01-22 MED ORDER — DIGOXIN 125 MCG PO TABS
0.0625 mg | ORAL_TABLET | Freq: Every day | ORAL | 11 refills | Status: DC
  Filled 2022-01-22 – 2022-02-26 (×2): qty 30, 60d supply, fill #0

## 2022-01-22 MED ORDER — POTASSIUM CHLORIDE CRYS ER 20 MEQ PO TBCR
40.0000 meq | EXTENDED_RELEASE_TABLET | Freq: Every day | ORAL | 11 refills | Status: DC
  Filled 2022-01-22: qty 60, 30d supply, fill #0

## 2022-01-22 NOTE — TOC CM/SW Note (Signed)
HF TOC CM spoke to pt at bedside. Provided pt with brochure for [facility] with appt time and date. Contacted [facility] to make aware pt will need assistance with transportation. Pt meds will be covered by HF Fund. Match not needed. Scheduled dc home on Sat or Sun. Will have meds come up from [facility] pharmacy today. [name] RN3 CCM, Heart Failure TOC CM [phone]  ?

## 2022-01-22 NOTE — Plan of Care (Signed)

## 2022-01-22 NOTE — Progress Notes (Addendum)
? ? Advanced Heart Failure Rounding Note ? ?PCP-Cardiologist: None  ? ?Subjective:   ?4/24 Cardiogenic shock. Started on DBA 3 mcg.  Also started on midodrine. Given 80 mg IV lasix and started on lasix drip. Diuresed 20+ lb.  ? ?CO-OX 53% off DBA. ? ?CVP 13 ? ?No BMET this am. Weight up 3 lb. On po torsemide. Reports little UOP yesterday. ? ?Right ankle painful this am. Otherwise feels okay. No dyspnea but hasn't gotten up to walk yet. ? ?SBP stable, 90s-110s last 24 hrs ? ?Objective:   ?Weight Range: ?74.5 kg ?Body mass index is 25.72 kg/m?.  ? ?Vital Signs:   ?Temp:  [97.6 ?F (36.4 ?C)-98.9 ?F (37.2 ?C)] 97.6 ?F (36.4 ?C) (04/28 0801) ?Pulse Rate:  [87-110] 96 (04/28 0801) ?Resp:  [17-20] 20 (04/28 0801) ?BP: (93-117)/(69-87) 117/72 (04/28 0801) ?SpO2:  [92 %-98 %] 92 % (04/28 0801) ?Weight:  [74.5 kg] 74.5 kg (04/28 [id number]) ?Last BM Date : 01/20/22 ? ?Weight change: ?Filed Weights  ? 01/20/22 0500 01/21/22 0423 01/22/22 [id number]  ?Weight: 72.5 kg 73.1 kg 74.5 kg  ? ? ?Intake/Output:  ? ?Intake/Output Summary (Last 24 hours) at 01/22/2022 0809 ?Last data filed at 01/22/2022 0600 ?Gross per 24 hour  ?Intake 323 ml  ?Output 700 ml  ?Net -377 ml  ?  ? ? ?Physical Exam  ? ?CVP 13 ?General:  No distress. Sitting up in bed. ?HEENT: normal ?Neck: supple. JVP 10-12. Carotids 2+ bilat; no bruits. No lymphadenopathy or thryomegaly appreciated. ?Cor: PMI nondisplaced. Regular rate & rhythm. No rubs, gallops or murmurs. ?Lungs: clear ?Abdomen: soft, nontender, nondistended. No hepatosplenomegaly.  ?Extremities: no cyanosis, clubbing, rash, 1-2 + LE edema, right ankle tender to palpation, + RUE PICC ?Neuro: alert & orientedx3, cranial nerves grossly intact. moves all 4 extremities w/o difficulty. Affect pleasant ? ? ?Telemetry  ? ?SR/sinus tach 90s-100s ? ?EKG  ?  ?N/A  ? ?Labs  ?  ?CBC ?Recent Labs  ?  01/22/22 ?0430  ?WBC 8.9  ?HGB 13.3  ?HCT 40.9  ?MCV 87.2  ?PLT 249  ? ?Basic Metabolic Panel ?Recent Labs  ?  01/20/22 ?0822  01/21/22 ?[id number]  ?NA 137 139  ?K 3.4* 3.7  ?CL 102 105  ?CO2 25 26  ?GLUCOSE 286* 121*  ?BUN 24* 24*  ?CREATININE 1.72* 1.35*  ?CALCIUM 8.2* 7.8*  ? ?Liver Function Tests ?No results for input(s): AST, ALT, ALKPHOS, BILITOT, PROT, ALBUMIN in the last 72 hours. ? ?No results for input(s): LIPASE, AMYLASE in the last 72 hours. ?Cardiac Enzymes ?No results for input(s): CKTOTAL, CKMB, CKMBINDEX, TROPONINI in the last 72 hours. ? ?BNP: ?BNP (last 3 results) ?Recent Labs  ?  11/19/21 ?1423 12/21/21 ?1255 01/17/22 ?1400  ?BNP 588.9* 887.4* 2,334.9*  ? ? ?ProBNP (last 3 results) ?No results for input(s): PROBNP in the last 8760 hours. ? ? ?D-Dimer ?No results for input(s): DDIMER in the last 72 hours. ?Hemoglobin A1C ?No results for input(s): HGBA1C in the last 72 hours. ? ?Fasting Lipid Panel ?No results for input(s): CHOL, HDL, LDLCALC, TRIG, CHOLHDL, LDLDIRECT in the last 72 hours. ?Thyroid Function Tests ?No results for input(s): TSH, T4TOTAL, T3FREE, THYROIDAB in the last 72 hours. ? ?Invalid input(s): FREET3 ? ?Other results: ? ? ?Imaging  ? ? ?No results found. ? ? ?Medications:   ? ? ?Scheduled Medications: ? amiodarone  200 mg Oral BID  ? aspirin  81 mg Oral Daily  ? atorvastatin  40 mg Oral Daily  ? Chlorhexidine Gluconate  Cloth  6 each Topical Daily  ? heparin  5,000 Units Subcutaneous Q8H  ? midodrine  10 mg Oral TID WC  ? sodium chloride flush  10-40 mL Intracatheter Q12H  ? torsemide  40 mg Oral Daily  ? ? ?Infusions: ? ? ? ?PRN Medications: ?acetaminophen, sodium chloride, sodium chloride flush ? ? ? ?Patient Profile  ? ?  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 Cath with 1-v CAD with chronically occluded RCA. MRI in January 2011 with EF 31% with inferior scar.  ?  ?Admitted with A/C HFrEF-->shock.  ? ?Assessment/Plan  ?1. A/C HFrEF --> Shock suspect cardiogenic. ?Cath with 1-v CAD with chronically occluded RCA. MRI in January  2011 with EF 31% with inferior scar.  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  EF 45-50% on echo in 8/13.  ?- No f/u between to 2015-2021 ?- Echo 6/21 EF 20-25% severe central MR. Repeat cath 6/21 with stable CAD ?- Echo 07/07/20 EF 30-35% ?- Echo 01/05/21 EF 40-45% mild to mod MR RV mild HK  ?- Repeat ECHO EF 30-35%.   ?- He had been off all meds for ~ 1 month.  ?- Started on DBA and lasix gtt w/ 20 lb diuresis. Now back on PO torsemide 40 mg daily  ?- DBA off 04/27. CO-OX 53% this am.  Recheck STAT. ?- CVP 13. Stop po torsemide >> give 80 mg lasix IV.  ?- Check BMET ?- GDMT limited by low BP. Continue midodrine for BP support  ?- Consider digoxin if renal function stable on labs ?- ? If may be inotrope dependent ? ?2. CKD Stage IIIa ?-Creatinine baseline 1.4 -->admit 1.7-->1.9 ->1.8-1.35   ?- BMET pending ?-Renal US- no hydronephrosis.  ?  ?3. PVCs ?-Continue amio 200 mg twice a day.  ?-Well suppressed  ?  ?4. CAD  ? -Cath with 1-v CAD with chronically occluded RCA ?- HS Trop 15  ?- No chest pain  ?- Continue aspirin and atorvastatin.  ?  ?5. ID ?-WBC 18.8 on admit -->12->10.9 ->8.9 ?-Bld CX - NGTD.  ? ?6. Hypokalemia  ?Labs today ? ?7. Right ankle pain ?- Check uric acid ? ?SDOH: ?Referred to Paramedicine.  ?Medicaid application started.  ?No insurance will need meds prior to d/c. HF Fund/Match. Will send meds today in case he goes home over the weekend. ? ?Length of Stay: 5 ? ?FINCH, LINDSAY N, PA-C  ?01/22/2022, 8:09 AM ? ?Advanced Heart Failure Team ?Pager [phone] (M-F; 7a - 5p)  ?Please contact [facility] for night-coverage after hours (5p -7a ) and weekends on [email or web address] ? ? ?Patient seen and examined with the above-signed Advanced Practice Provider and/or Housestaff. I personally reviewed laboratory data, imaging studies and relevant notes. I independently examined the patient and formulated the important aspects of the plan. I have edited the note to reflect any of  my changes or salient points. I have personally discussed the plan with the patient and/or family. ? ?Having pain in right ankle. Feels bloated. Denies orthopnea or PND. Off DBA. Co-ox 66% CVP 13 . On midodrine for BP support.  ? ?General:  Well appearing. No resp difficulty ?HEENT: normal ?Neck: supple. Jvp to jaw  Carotids 2+ bilat; no bruits. No lymphadenopathy or thryomegaly appreciated. ?Cor: PMI nondisplaced. Regular rate & rhythm. No rubs, gallops or murmurs. ?Lungs: clear ?Abdomen: soft, nontender, nondistended. No hepatosplenomegaly. No bruits or masses. Good bowel  sounds. ?Extremities: no cyanosis, clubbing, rash, edema  R ankle swollen and tender ?Neuro: alert & orientedx3, cranial nerves grossly intact. moves all 4 extremities w/o difficulty. Affect pleasant ? ?Remains volume overloaded. Resume IV lasix. Co-ox stable off DBA. On midodrine for BP support. Check uric acid. Start prednisone for ankle pain.  ? ?Zachary Bickers, MD  ?5:26 PM ? ? ?

## 2022-01-22 NOTE — Progress Notes (Signed)
?  Mobility Specialist Criteria Algorithm Info. ? ? ? 01/22/22 1050  ?Mobility  ?Activity Ambulated with assistance in hallway  ?Range of Motion/Exercises Active;All extremities  ?Level of Assistance Modified independent, requires aide device or extra time  ?Assistive Device Other (Comment) ?(IV pole)  ?Distance Ambulated (ft) 500 ft  ?Activity Response Tolerated well  ? ?Patient received dangling EOB complaining of persistent cough that's present when lying supine and improves when sitting up. Explained that is felt like fluid was chocking him causing the coughing spell. Agreed to participate. Ambulated in hallway independently with steady gait. Tolerated ambulation well without complaint or incident. Pt did have some mild SOB upon returning that improved with rest. Was left with all needs met, call bell in reach. ? ?01/22/2022 ?1:10 PM ? ?Zachary Arias, CMS, BS EXP ?Ac[facility] Services  ?QQIWL:[phone] ?Office: [phone] ? ?

## 2022-01-22 NOTE — Progress Notes (Signed)
? ?PROGRESS NOTE ? ? ? ?Zachary Arias  [id number] DOB: 06/22/1961 DOA: 01/17/2022 ?PCP: Default, Provider, MD ? ? ?Brief Narrative: ?Zachary Arias is a 61 y.o. male with a history of combined CHF, ischemic cardiomyopathy, CAD, hypertension, CKD stage IIIa, frequent PVCs on amiodarone. Patient presented secondary to shortness of breath and LE swelling, found to have evidence of acute heart failure. IV diuresis initiated. Advanced heart failure team consulted for resultant cardiogenic shock requiring initiation of dobutamine IV. ? ? ?Assessment and Plan: ? ?Acute on chronic combined systolic and diastolic heart failure ?Cardiogenic shock ?Advanced heart failure team consulted. BNP of 2,334 on admission. Patient initially managed on Lasix IV with subsequent hypotension, requiring initiation of dobutamine. Midodrine also started. Heart failure managed with Lasix IV drip which has been transitioned to torsemide. ?-Heart failure recommendations: torsemide, midodrine ? ?Leukocytosis ?Uncertain etiology. No infectious etiology identified. Trended down. ? ?AKI on CKD stage IIIa ?Baseline creatinine is about 1.4. Peak creatinine of 1.89 this admission with slow downtrend on diuresis. Likely combination of hypotension/volume overload. ? ?Hypokalemia ?Potassium of 3.4 on BMP 4/26. Likely related to diuresis. Resolved with repletion and transition off of IV Lasix. ? ?PVCs ?-Continue amiodarone ? ?Right ankle pain ?No history of gout. Unsure of etiology. No injury noted. Associated edema but he has bilateral edema from heart failure. No redness of warmth. Patient was managed on loop diuretic therapy which could precipitate gout. Patient is able to ambulate ?-Symptom management for now ? ?DVT prophylaxis: Heparin subq ?Code Status:   Code Status: Full Code ?Family Communication: None at bedside ?Disposition Plan: Discharge pending heart failure team recommendations ? ? ?Consultants:  ?Cardiology/Advanced heart  failure ? ?Procedures:  ?Transthoracic Echocardiogram (01/18/2022) ? ?Antimicrobials: ?None  ? ? ?Subjective: ?No issues overnight. Breathing well. ? ?Objective: ?BP (!) 111/96 (BP Location: Left Arm)   Pulse 100   Temp 97.6 ?F (36.4 ?C) (Oral)   Resp 18   Ht 5\' 7"  (1.702 m)   Wt 74.5 kg   SpO2 95%   BMI 25.72 kg/m?  ? ?Examination: ? ?General exam: Appears calm and comfortable ?Respiratory system: Clear to auscultation. Respiratory effort normal. ?Cardiovascular system: S1 & S2 heard, RRR. Systolic murmur ?Gastrointestinal system: Abdomen is nondistended, soft and nontender. Normal bowel sounds heard. ?Central nervous system: Alert and oriented. No focal neurological deficits. ?Musculoskeletal: 1-2+ edema of bilateral ankles and dorsum of feet. No calf tenderness ?Skin: No cyanosis. No rashes ?Psychiatry: Judgement and insight appear normal. Mood & affect appropriate.  ? ? ?Data Reviewed: I have personally reviewed following labs and imaging studies ? ?CBC ?Lab Results  ?Component Value Date  ? WBC 8.9 01/22/2022  ? RBC 4.69 01/22/2022  ? HGB 13.3 01/22/2022  ? HCT 40.9 01/22/2022  ? MCV 87.2 01/22/2022  ? MCH 28.4 01/22/2022  ? PLT 249 01/22/2022  ? MCHC 32.5 01/22/2022  ? RDW 15.3 01/22/2022  ? LYMPHSABS 0.5 (L) 01/18/2022  ? MONOABS 0.8 01/18/2022  ? EOSABS 0.0 01/18/2022  ? BASOSABS 0.0 01/18/2022  ? ? ? ?Last metabolic panel ?Lab Results  ?Component Value Date  ? NA 135 01/22/2022  ? K 3.5 01/22/2022  ? CL 105 01/22/2022  ? CO2 24 01/22/2022  ? BUN 24 (H) 01/22/2022  ? CREATININE 1.26 (H) 01/22/2022  ? GLUCOSE 124 (H) 01/22/2022  ? GFRNONAA >60 01/22/2022  ? GFRAA >60 05/15/2020  ? CALCIUM 8.0 (L) 01/22/2022  ? PHOS 3.1 03/06/2020  ? PROT 5.4 (L) 01/19/2022  ? ALBUMIN 2.9 (L) 01/19/2022  ?  BILITOT 1.3 (H) 01/19/2022  ? ALKPHOS 45 01/19/2022  ? AST 27 01/19/2022  ? ALT 37 01/19/2022  ? ANIONGAP 6 01/22/2022  ? ? ?GFR: ?Estimated Creatinine Clearance: 59 mL/min (A) (by C-G formula based on SCr of 1.26  mg/dL (H)). ? ?Recent Results (from the past 240 hour(s))  ?MRSA Next Gen by PCR, Nasal     Status: None  ? Collection Time: 01/17/22  7:32 PM  ? Specimen: Nasal Mucosa; Nasal Swab  ?Result Value Ref Range Status  ? MRSA by PCR Next Gen NOT DETECTED NOT DETECTED Final  ?  Comment: (NOTE) ?The GeneXpert MRSA Assay (FDA approved for NASAL specimens only), ?is one component of a comprehensive MRSA colonization surveillance ?program. It is not intended to diagnose MRSA infection nor to guide ?or monitor treatment for MRSA infections. ?Test performance is not FDA approved in patients less than 2 years ?old. ?Performed at [facility], [place] [address], [place], [place] ?[address] ?  ?Culture, blood (Routine X 2) w Reflex to ID Panel     Status: None (Preliminary result)  ? Collection Time: 01/18/22 10:41 AM  ? Specimen: BLOOD  ?Result Value Ref Range Status  ? Specimen Description BLOOD RIGHT ANTECUBITAL  Final  ? Special Requests   Final  ?  BOTTLES DRAWN AEROBIC AND ANAEROBIC Blood Culture adequate volume  ? Culture   Final  ?  NO GROWTH 4 DAYS ?Performed at [facility], [place] [address], [place], [place] [address] ?  ? Report Status PENDING  Incomplete  ?Culture, blood (Routine X 2) w Reflex to ID Panel     Status: None (Preliminary result)  ? Collection Time: 01/18/22 10:45 AM  ? Specimen: BLOOD  ?Result Value Ref Range Status  ? Specimen Description BLOOD BLOOD RIGHT FOREARM  Final  ? Special Requests   Final  ?  BOTTLES DRAWN AEROBIC AND ANAEROBIC Blood Culture adequate volume  ? Culture   Final  ?  NO GROWTH 4 DAYS ?Performed at [facility], [place] [address], [place], [place] [address] ?  ? Report Status PENDING  Incomplete  ?  ? ? ?Radiology Studies: ?No results found. ? ? ? LOS: 5 days  ? ? ?Cordelia Poche, MD ?[facility] ?01/22/2022, 2:08 PM ? ? ?If 7PM-7AM, please contact night-coverage ?[email or web address] ? ?

## 2022-01-23 LAB — CULTURE, BLOOD (ROUTINE X 2)
Culture: NO GROWTH
Culture: NO GROWTH
Special Requests: ADEQUATE
Special Requests: ADEQUATE

## 2022-01-23 LAB — BASIC METABOLIC PANEL
Anion gap: 8 (ref 5–15)
BUN: 23 mg/dL — ABNORMAL HIGH (ref 6–20)
CO2: 23 mmol/L (ref 22–32)
Calcium: 7.9 mg/dL — ABNORMAL LOW (ref 8.9–10.3)
Chloride: 107 mmol/L (ref 98–111)
Creatinine, Ser: 1.35 mg/dL — ABNORMAL HIGH (ref 0.61–1.24)
GFR, Estimated: >60 mL/min (ref >60)
Glucose, Bld: 120 mg/dL — ABNORMAL HIGH (ref 70–99)
Potassium: 3.9 mmol/L (ref 3.5–5.1)
Sodium: 138 mmol/L (ref 135–145)

## 2022-01-23 LAB — MAGNESIUM: Magnesium: 2.2 mg/dL (ref 1.7–2.4)

## 2022-01-23 LAB — COOXEMETRY PANEL
Carboxyhemoglobin: 1.7 % — ABNORMAL HIGH (ref 0.5–1.5)
Methemoglobin: <0.7 % (ref 0.0–1.5)
O2 Saturation: 64.1 %
Total hemoglobin: 12.9 g/dL (ref 12.0–16.0)

## 2022-01-23 MED ORDER — FUROSEMIDE 10 MG/ML IJ SOLN
80.0000 mg | Freq: Two times a day (BID) | INTRAMUSCULAR | Status: DC
  Administered 2022-01-23 – 2022-01-25 (×4): 80 mg via INTRAVENOUS
  Filled 2022-01-23 (×4): qty 8

## 2022-01-23 MED ORDER — METOLAZONE 2.5 MG PO TABS
2.5000 mg | ORAL_TABLET | Freq: Every day | ORAL | Status: DC
  Administered 2022-01-23 – 2022-01-24 (×2): 2.5 mg via ORAL
  Filled 2022-01-23 (×2): qty 1

## 2022-01-23 MED ORDER — POTASSIUM CHLORIDE CRYS ER 20 MEQ PO TBCR
40.0000 meq | EXTENDED_RELEASE_TABLET | Freq: Every day | ORAL | Status: DC
  Administered 2022-01-23 – 2022-01-24 (×2): 40 meq via ORAL
  Filled 2022-01-23 (×2): qty 2

## 2022-01-23 NOTE — Progress Notes (Signed)
? ? Advanced Heart Failure Rounding Note ? ?PCP-Cardiologist: None  ? ?Subjective:   ? ?4/24 Cardiogenic shock. Started on DBA 3 mcg.  Also started on midodrine. Given 80 mg IV lasix and started on lasix drip. Diuresed 20+ lb.  ? ?CO-OX 64% off DBA. On midodrine for BP support. SBP 101-128 ? ?IV lasix restarted yesterday due to elevated CVP. Weight down 2 pounds but CVP up 13 -> 16 (checked personally) ? ?Denies CP, SOB, orthopnea or PND. Feels like he has fluid on board. + edema ? ? ?Objective:   ?Weight Range: ?73.7 kg ?Body mass index is 25.45 kg/m?.  ? ?Vital Signs:   ?Temp:  [97.5 ?F (36.4 ?C)-98.6 ?F (37 ?C)] 97.5 ?F (36.4 ?C) (04/29 1227) ?Pulse Rate:  [91-93] 91 (04/29 0357) ?Resp:  [18-19] 19 (04/29 0357) ?BP: (101-128)/(70-86) 128/79 (04/29 1227) ?SpO2:  [90 %-98 %] 98 % (04/29 0357) ?Weight:  [73.7 kg] 73.7 kg (04/29 0555) ?Last BM Date : 01/22/22 ? ?Weight change: ?Filed Weights  ? 01/21/22 0423 01/22/22 [id number] 01/23/22 0555  ?Weight: 73.1 kg 74.5 kg 73.7 kg  ? ? ?Intake/Output:  ?  ? ? ?Physical Exam  ? ?General:  Well appearing. No resp difficulty ?HEENT: normal ?Neck: supple. JVP to ear Carotids 2+ bilat; no bruits. No lymphadenopathy or thryomegaly appreciated. ?Cor: PMI nondisplaced. Regular tachy + s3 ?Lungs: clear ?Abdomen: soft, nontender, nondistended. No hepatosplenomegaly. No bruits or masses. Good bowel sounds. ?Extremities: no cyanosis, clubbing, rash, 1+ edema R>L ?Neuro: alert & orientedx3, cranial nerves grossly intact. moves all 4 extremities w/o difficulty. Affect pleasant ? ? ?Telemetry  ? ?Sinus 100-110 Personally reviewed ? ?Labs  ?  ?CBC ?Recent Labs  ?  01/22/22 ?0430  ?WBC 8.9  ?HGB 13.3  ?HCT 40.9  ?MCV 87.2  ?PLT 249  ? ? ?Basic Metabolic Panel ?Recent Labs  ?  01/22/22 ?0942 01/23/22 ?0405  ?NA 135 138  ?K 3.5 3.9  ?CL 105 107  ?CO2 24 23  ?GLUCOSE 124* 120*  ?BUN 24* 23*  ?CREATININE 1.26* 1.35*  ?CALCIUM 8.0* 7.9*  ?MG  --  2.2  ? ? ?Liver Function Tests ?No results for  input(s): AST, ALT, ALKPHOS, BILITOT, PROT, ALBUMIN in the last 72 hours. ? ?No results for input(s): LIPASE, AMYLASE in the last 72 hours. ?Cardiac Enzymes ?No results for input(s): CKTOTAL, CKMB, CKMBINDEX, TROPONINI in the last 72 hours. ? ?BNP: ?BNP (last 3 results) ?Recent Labs  ?  11/19/21 ?1423 12/21/21 ?1255 01/17/22 ?1400  ?BNP 588.9* 887.4* 2,334.9*  ? ? ? ?ProBNP (last 3 results) ?No results for input(s): PROBNP in the last 8760 hours. ? ? ?D-Dimer ?No results for input(s): DDIMER in the last 72 hours. ?Hemoglobin A1C ?No results for input(s): HGBA1C in the last 72 hours. ? ?Fasting Lipid Panel ?No results for input(s): CHOL, HDL, LDLCALC, TRIG, CHOLHDL, LDLDIRECT in the last 72 hours. ?Thyroid Function Tests ?No results for input(s): TSH, T4TOTAL, T3FREE, THYROIDAB in the last 72 hours. ? ?Invalid input(s): FREET3 ? ?Other results: ? ? ?Imaging  ? ? ?No results found. ? ? ?Medications:   ? ? ?Scheduled Medications: ? amiodarone  200 mg Oral BID  ? aspirin  81 mg Oral Daily  ? atorvastatin  40 mg Oral Daily  ? Chlorhexidine Gluconate Cloth  6 each Topical Daily  ? dapagliflozin propanediol  10 mg Oral Daily  ? digoxin  0.0625 mg Oral Daily  ? heparin  5,000 Units Subcutaneous Q8H  ? midodrine  10 mg  Oral TID WC  ? sodium chloride flush  10-40 mL Intracatheter Q12H  ? ? ?Infusions: ? ? ? ?PRN Medications: ?acetaminophen, sodium chloride, sodium chloride flush ? ? ? ?Patient Profile  ? ?  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 Cath with 1-v CAD with chronically occluded RCA. MRI in January 2011 with EF 31% with inferior scar.  ?  ?Admitted with A/C HFrEF-->shock.  ? ?Assessment/Plan  ? ?1. A/C HFrEF --> Shock suspect cardiogenic. ?Cath with 1-v CAD with chronically occluded RCA. MRI in January 2011 with EF 31% with inferior scar.  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  EF 45-50% on  echo in 8/13.  ?- No f/u between to 2015-2021 ?- Echo 6/21 EF 20-25% severe central MR. Repeat cath 6/21 with stable CAD ?- Echo 07/07/20 EF 30-35% ?- Echo 01/05/21 EF 40-45% mild to mod MR RV mild HK  ?- Echo this admit 4/23 EF 30-35%.   ?- He had been off all meds for ~ 1 month.  ?- Started on DBA and lasix gtt w/ 20 lb diuresis.  ?- DBA off 04/27. CO-OX 60% this am. CVP going back up.  ?- Continue IV lasix. Add metolazone.  ?- Renal function stable  ?- Continue Farxiga 10 ?- Continue digoxin 0.125 ?- GDMT limited by low BP. Continue midodrine for BP support  ? ?2. AKI on CKD Stage IIIa ?- Due to cardiorenal syndrome ?- Creatinine baseline 1.4 -->admit 1.7-->1.9 -->-> 1.35  ?-Renal US- no hydronephrosis.  ?  ?3. PVCs ?-Continue amio 200 mg twice a day.  ?-Well suppressed  ?  ?4. CAD  ? -Cath with 1-v CAD with chronically occluded RCA ?- HS Trop 15  ?- No s/s angina ?- Continue aspirin and atorvastatin.  ?  ?5. Hypokalemia  ? - supp as needed ? ?6. Right ankle pain ?- Uric acid ok ?- No evidence septic arthritis ?- pain improved with prednisone x  1 ? ?SDOH: ?Referred to Paramedicine.  ?Medicaid application started.  ?No insurance will need meds prior to d/c. HF Fund/Match. Will send meds today in case he goes home over the weekend. ? ?Length of Stay: 6 ? ?Glori Bickers, MD  ?01/23/2022, 12:39 PM ? ?Advanced Heart Failure Team ?Pager [phone] (M-F; 7a - 5p)  ?Please contact [facility] for night-coverage after hours (5p -7a ) and weekends on [email or web address] ? ? ? ?

## 2022-01-23 NOTE — Progress Notes (Signed)
? ?PROGRESS NOTE ? ? ? ?Zachary Arias  [id number] DOB: 01-Dec-1960 DOA: 01/17/2022 ?PCP: Default, Provider, MD ? ? ?Brief Narrative: ?Zachary Arias is a 61 y.o. male with a history of combined CHF, ischemic cardiomyopathy, CAD, hypertension, CKD stage IIIa, frequent PVCs on amiodarone. Patient presented secondary to shortness of breath and LE swelling, found to have evidence of acute heart failure. IV diuresis initiated. Advanced heart failure team consulted for resultant cardiogenic shock requiring initiation of dobutamine IV. ? ? ?Assessment and Plan: ? ?Acute on chronic combined systolic and diastolic heart failure ?Cardiogenic shock ?Advanced heart failure team consulted. BNP of 2,334 on admission. Patient initially managed on Lasix IV with subsequent hypotension, requiring initiation of dobutamine. Midodrine also started. Heart failure managed with Lasix IV drip which has been transitioned to torsemide. ?-Heart failure recommendations: torsemide, midodrine ? ?Leukocytosis ?Uncertain etiology. No infectious etiology identified. Trended down. ? ?AKI on CKD stage IIIa ?Baseline creatinine is about 1.4. Peak creatinine of 1.89 this admission with slow downtrend on diuresis. Likely combination of hypotension/volume overload. ? ?Hypokalemia ?Potassium of 3.4 on BMP 4/26. Likely related to diuresis. Resolved with repletion and transition off of IV Lasix. ? ?PVCs ?-Continue amiodarone ? ?Right ankle pain ?No history of gout. Unsure of etiology. No injury noted. Symptoms started before admission after swelling developed. Associated edema but he has bilateral edema from heart failure. No redness of warmth. Patient was managed on loop diuretic therapy which could precipitate gout. Patient is able to ambulate. Given a dose of prednisone. ?-Symptom management for now ? ?DVT prophylaxis: Heparin subq ?Code Status:   Code Status: Full Code ?Family Communication: None at bedside ?Disposition Plan: Discharge pending heart  failure team recommendations ? ? ?Consultants:  ?Cardiology/Advanced heart failure ? ?Procedures:  ?Transthoracic Echocardiogram (01/18/2022) ? ?Antimicrobials: ?None  ? ? ?Subjective: ?Ankle pain improving. Swelling in feet remains persistent. No dyspnea or chest pain. ? ?Objective: ?BP 105/77 (BP Location: Left Arm)   Pulse 91   Temp 98.2 ?F (36.8 ?C) (Oral)   Resp 19   Ht 5\' 7"  (1.702 m)   Wt 73.7 kg   SpO2 98%   BMI 25.45 kg/m?  ? ?Examination: ? ?General exam: Appears calm and comfortable ?Respiratory system: Clear to auscultation. Respiratory effort normal. ?Cardiovascular system: S1 & S2 heard, RRR. ?Gastrointestinal system: Abdomen is nondistended, soft and nontender. No organomegaly or masses felt. Normal bowel sounds heard. ?Central nervous system: Alert and oriented. No focal neurological deficits. ?Musculoskeletal: 1+ pitting edema of ankles and 2+ pitting edema of dorsal aspect of feet. No calf tenderness ?Skin: No cyanosis. No rashes ?Psychiatry: Judgement and insight appear normal. Mood & affect appropriate.  ? ? ?Data Reviewed: I have personally reviewed following labs and imaging studies ? ?CBC ?Lab Results  ?Component Value Date  ? WBC 8.9 01/22/2022  ? RBC 4.69 01/22/2022  ? HGB 13.3 01/22/2022  ? HCT 40.9 01/22/2022  ? MCV 87.2 01/22/2022  ? MCH 28.4 01/22/2022  ? PLT 249 01/22/2022  ? MCHC 32.5 01/22/2022  ? RDW 15.3 01/22/2022  ? LYMPHSABS 0.5 (L) 01/18/2022  ? MONOABS 0.8 01/18/2022  ? EOSABS 0.0 01/18/2022  ? BASOSABS 0.0 01/18/2022  ? ? ? ?Last metabolic panel ?Lab Results  ?Component Value Date  ? NA 138 01/23/2022  ? K 3.9 01/23/2022  ? CL 107 01/23/2022  ? CO2 23 01/23/2022  ? BUN 23 (H) 01/23/2022  ? CREATININE 1.35 (H) 01/23/2022  ? GLUCOSE 120 (H) 01/23/2022  ? GFRNONAA >60 01/23/2022  ?  GFRAA >60 05/15/2020  ? CALCIUM 7.9 (L) 01/23/2022  ? PHOS 3.1 03/06/2020  ? PROT 5.4 (L) 01/19/2022  ? ALBUMIN 2.9 (L) 01/19/2022  ? BILITOT 1.3 (H) 01/19/2022  ? ALKPHOS 45 01/19/2022  ? AST 27  01/19/2022  ? ALT 37 01/19/2022  ? ANIONGAP 8 01/23/2022  ? ? ?GFR: ?Estimated Creatinine Clearance: 55.1 mL/min (A) (by C-G formula based on SCr of 1.35 mg/dL (H)). ? ?Recent Results (from the past 240 hour(s))  ?MRSA Next Gen by PCR, Nasal     Status: None  ? Collection Time: 01/17/22  7:32 PM  ? Specimen: Nasal Mucosa; Nasal Swab  ?Result Value Ref Range Status  ? MRSA by PCR Next Gen NOT DETECTED NOT DETECTED Final  ?  Comment: (NOTE) ?The GeneXpert MRSA Assay (FDA approved for NASAL specimens only), ?is one component of a comprehensive MRSA colonization surveillance ?program. It is not intended to diagnose MRSA infection nor to guide ?or monitor treatment for MRSA infections. ?Test performance is not FDA approved in patients less than 2 years ?old. ?Performed at [facility], [place] [address], [place], [place] ?[address] ?  ?Culture, blood (Routine X 2) w Reflex to ID Panel     Status: None  ? Collection Time: 01/18/22 10:41 AM  ? Specimen: BLOOD  ?Result Value Ref Range Status  ? Specimen Description BLOOD RIGHT ANTECUBITAL  Final  ? Special Requests   Final  ?  BOTTLES DRAWN AEROBIC AND ANAEROBIC Blood Culture adequate volume  ? Culture   Final  ?  NO GROWTH 5 DAYS ?Performed at [facility], [place] [address], [place], [place] [address] ?  ? Report Status 01/23/2022 FINAL  Final  ?Culture, blood (Routine X 2) w Reflex to ID Panel     Status: None  ? Collection Time: 01/18/22 10:45 AM  ? Specimen: BLOOD  ?Result Value Ref Range Status  ? Specimen Description BLOOD BLOOD RIGHT FOREARM  Final  ? Special Requests   Final  ?  BOTTLES DRAWN AEROBIC AND ANAEROBIC Blood Culture adequate volume  ? Culture   Final  ?  NO GROWTH 5 DAYS ?Performed at [facility], [place] [address], [place], [place] [address] ?  ? Report Status 01/23/2022 FINAL  Final  ?  ? ? ?Radiology Studies: ?No results found. ? ? ? LOS: 6 days  ? ? ?Cordelia Poche, MD ?[facility] ?01/23/2022, 12:18 PM ? ? ?If 7PM-7AM, please contact  night-coverage ?[email or web address] ? ?

## 2022-01-23 NOTE — Progress Notes (Signed)
?  Mobility Specialist Criteria Algorithm Info. ? ? ? 01/23/22 1505  ?Mobility  ?Activity Ambulated with assistance in hallway (in chair before and after)  ?Range of Motion/Exercises Active;All extremities  ?Level of Assistance Modified independent, requires aide device or extra time  ?Assistive Device Other (Comment) ?(IV pole)  ?Distance Ambulated (ft) 500 ft  ?Activity Response Tolerated well  ? ?Patient received in chair eager to participate in mobility. Patient ambulated in hallway mod I with steady gait. C/O mild SOB that improved with standing rest break. SpO2 maintained >95% on RA. Returned to room without incident. Was left in recliner chair with all needs met, call bell in reach. ? ? ?01/23/2022 ?5:02 PM ? ?Zachary Arias, CMS, BS EXP ?[facility] Services  ?ZLDJT:[phone] ?Office: [phone] ? ?

## 2022-01-24 ENCOUNTER — Inpatient Hospital Stay (HOSPITAL_COMMUNITY): Payer: Medicaid Other

## 2022-01-24 LAB — BASIC METABOLIC PANEL
Anion gap: 7 (ref 5–15)
BUN: 26 mg/dL — ABNORMAL HIGH (ref 6–20)
CO2: 25 mmol/L (ref 22–32)
Calcium: 8.4 mg/dL — ABNORMAL LOW (ref 8.9–10.3)
Chloride: 105 mmol/L (ref 98–111)
Creatinine, Ser: 1.53 mg/dL — ABNORMAL HIGH (ref 0.61–1.24)
GFR, Estimated: 52 mL/min — ABNORMAL LOW (ref >60)
Glucose, Bld: 100 mg/dL — ABNORMAL HIGH (ref 70–99)
Potassium: 3.9 mmol/L (ref 3.5–5.1)
Sodium: 137 mmol/L (ref 135–145)

## 2022-01-24 LAB — MAGNESIUM: Magnesium: 2.5 mg/dL — ABNORMAL HIGH (ref 1.7–2.4)

## 2022-01-24 LAB — COOXEMETRY PANEL
Carboxyhemoglobin: 0.5 % (ref 0.5–1.5)
Methemoglobin: 2 % — ABNORMAL HIGH (ref 0.0–1.5)
O2 Saturation: 56.2 %
Total hemoglobin: 13.2 g/dL (ref 12.0–16.0)

## 2022-01-24 NOTE — Progress Notes (Addendum)
? ? Advanced Heart Failure Rounding Note ? ?PCP-Cardiologist: None  ? ?Subjective:   ? ?4/24 Cardiogenic shock. Started on DBA 3 mcg.  Also started on midodrine. Given 80 mg IV lasix and started on lasix drip. Diuresed 20+ lb.  ? ?CO-OX 56% off DBA. On midodrine for BP support. SBP 98-118 ? ?Remains on IV lasix. Got metolazone yesterday as well. 2.7 L out. Weight down 2 pounds. CVP 8  Scr 1.35 -> 1.53 ? ?Says breathing is much better. No orthopnea or PND. Only complaint today is his right ankle swelling/pain. Xray negative except for soft tissue swelling.  ? ?Objective:   ?Weight Range: ?72.8 kg ?Body mass index is 25.14 kg/m?.  ? ?Vital Signs:   ?Temp:  [97.4 ?F (36.3 ?C)-98.2 ?F (36.8 ?C)] 97.4 ?F (36.3 ?C) (04/30 0734) ?Pulse Rate:  [85-97] 85 (04/30 0433) ?Resp:  [17-22] 22 (04/30 0734) ?BP: (98-128)/(74-79) 118/79 (04/30 0734) ?SpO2:  [98 %-100 %] 99 % (04/30 0433) ?Weight:  [72.8 kg] 72.8 kg (04/30 0433) ?Last BM Date : 01/23/22 ? ?Weight change: ?Filed Weights  ? 01/22/22 [id number] 01/23/22 0555 01/24/22 0433  ?Weight: 74.5 kg 73.7 kg 72.8 kg  ? ? ?Intake/Output:  ?  ? ? ?Physical Exam  ? ?General:  Well appearing. No resp difficulty ?HEENT: normal ?Neck: supple. JVP 8 Carotids 2+ bilat; no bruits. No lymphadenopathy or thryomegaly appreciated. ?Cor: Regular rate & rhythm. No rubs, gallops or murmurs. ?Lungs: clear ?Abdomen: soft, nontender, nondistended. No hepatosplenomegaly. No bruits or masses. Good bowel sounds. ?Extremities: no cyanosis, clubbing, rash, edema  R ankle tender and swollen ?Neuro: alert & orientedx3, cranial nerves grossly intact. moves all 4 extremities w/o difficulty. Affect pleasant ? ? ? ?Telemetry  ? ?Sinus 100-110 Personally reviewed ? ?Labs  ?  ?CBC ?Recent Labs  ?  01/22/22 ?0430  ?WBC 8.9  ?HGB 13.3  ?HCT 40.9  ?MCV 87.2  ?PLT 249  ? ? ?Basic Metabolic Panel ?Recent Labs  ?  01/23/22 ?0405 01/24/22 ?[id number]  ?NA 138 137  ?K 3.9 3.9  ?CL 107 105  ?CO2 23 25  ?GLUCOSE 120* 100*  ?BUN 23*  26*  ?CREATININE 1.35* 1.53*  ?CALCIUM 7.9* 8.4*  ?MG 2.2 2.5*  ? ? ?Liver Function Tests ?No results for input(s): AST, ALT, ALKPHOS, BILITOT, PROT, ALBUMIN in the last 72 hours. ? ?No results for input(s): LIPASE, AMYLASE in the last 72 hours. ?Cardiac Enzymes ?No results for input(s): CKTOTAL, CKMB, CKMBINDEX, TROPONINI in the last 72 hours. ? ?BNP: ?BNP (last 3 results) ?Recent Labs  ?  11/19/21 ?1423 12/21/21 ?1255 01/17/22 ?1400  ?BNP 588.9* 887.4* 2,334.9*  ? ? ? ?ProBNP (last 3 results) ?No results for input(s): PROBNP in the last 8760 hours. ? ? ?D-Dimer ?No results for input(s): DDIMER in the last 72 hours. ?Hemoglobin A1C ?No results for input(s): HGBA1C in the last 72 hours. ? ?Fasting Lipid Panel ?No results for input(s): CHOL, HDL, LDLCALC, TRIG, CHOLHDL, LDLDIRECT in the last 72 hours. ?Thyroid Function Tests ?No results for input(s): TSH, T4TOTAL, T3FREE, THYROIDAB in the last 72 hours. ? ?Invalid input(s): FREET3 ? ?Other results: ? ? ?Imaging  ? ? ?No results found. ? ? ?Medications:   ? ? ?Scheduled Medications: ? amiodarone  200 mg Oral BID  ? aspirin  81 mg Oral Daily  ? atorvastatin  40 mg Oral Daily  ? Chlorhexidine Gluconate Cloth  6 each Topical Daily  ? dapagliflozin propanediol  10 mg Oral Daily  ? digoxin  0.0625  mg Oral Daily  ? furosemide  80 mg Intravenous BID  ? heparin  5,000 Units Subcutaneous Q8H  ? metolazone  2.5 mg Oral Daily  ? midodrine  10 mg Oral TID WC  ? potassium chloride  40 mEq Oral Daily  ? sodium chloride flush  10-40 mL Intracatheter Q12H  ? ? ?Infusions: ? ? ? ?PRN Medications: ?acetaminophen, sodium chloride, sodium chloride flush ? ? ? ?Patient Profile  ? ?  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 Cath with 1-v CAD with chronically occluded RCA. MRI in January 2011 with EF 31% with inferior scar.  ?  ?Admitted with A/C HFrEF-->shock.  ? ?Assessment/Plan  ? ?1. A/C HFrEF --> Shock  suspect cardiogenic. ?Cath with 1-v CAD with chronically occluded RCA. MRI in January 2011 with EF 31% with inferior scar.  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  EF 45-50% on echo in 8/13.  ?- No f/u between to 2015-2021 ?- Echo 6/21 EF 20-25% severe central MR. Repeat cath 6/21 with stable CAD ?- Echo 07/07/20 EF 30-35% ?- Echo 01/05/21 EF 40-45% mild to mod MR RV mild HK  ?- Echo this admit 4/23 EF 30-35%.   ?- He had been off all meds for ~ 1 month.  ?- Started on DBA and lasix gtt w/ 20 lb diuresis.  ?- DBA off 04/27. CO-OX 56% this am.  ?- Remains on IV lasix and metolazone. Weight down 2 pounds CVP 7-8 ?- Renal function slightly worse 1.35 -> 1.53 ?- Will switch to po torsemide starting tomorrow. Will need to find right dose before sending home as he previously failed switch to oral diuretics with rising CVP.  ?- Continue Farxiga 10 ?- Continue digoxin 0.125 ?- GDMT limited by low BP. Continue midodrine for BP support  ? ?2. AKI on CKD Stage IIIa ?- Due to cardiorenal syndrome ?- Creatinine baseline 1.4 -->admit 1.7-->1.9 -->-> 1.35 -> 1.53 ?-Renal US- no hydronephrosis.  ?  ?3. PVCs ?-Continue amio 200 mg twice a day.  ?- Well suppressed ? ?4. CAD  ? -Cath with 1-v CAD with chronically occluded RCA ?- HS Trop 15  ?- No s/s angina ?- Continue aspirin and atorvastatin.  ?  ?5. Hypokalemia  ? - supp as needed. K 3.9 today ? ?6. Right ankle pain ?- Uric acid ok ?- No evidence septic arthritis ?- Some relief with prednisone x 1 but now pain worse ?- Xray negative except for soft tissue swelling.  ?- TRH managing ? ?SDOH: ?Referred to Paramedicine.  ?Medicaid application started.  ?No insurance will need meds prior to d/c. HF Fund/Match.  ? ?Length of Stay: 7 ? ?Glori Bickers, MD  ?01/24/2022, 11:51 AM ? ?Advanced Heart Failure Team ?Pager [phone] (M-F; 7a - 5p)  ?Please contact [facility] for night-coverage after hours (5p -7a ) and weekends on [email or web address] ? ? ? ?

## 2022-01-24 NOTE — Progress Notes (Signed)
? ?PROGRESS NOTE ? ? ? ?Zachary Arias  [id number] DOB: 11-21-1960 DOA: 01/17/2022 ?PCP: Default, Provider, MD ? ? ?Brief Narrative: ?Zachary Arias is a 61 y.o. male with a history of combined CHF, ischemic cardiomyopathy, CAD, hypertension, CKD stage IIIa, frequent PVCs on amiodarone. Patient presented secondary to shortness of breath and LE swelling, found to have evidence of acute heart failure. IV diuresis initiated. Advanced heart failure team consulted for resultant cardiogenic shock requiring initiation of dobutamine IV. ? ? ?Assessment and Plan: ? ?Acute on chronic combined systolic and diastolic heart failure ?Cardiogenic shock ?Advanced heart failure team consulted. BNP of 2,334 on admission. Patient initially managed on Lasix IV with subsequent hypotension, requiring initiation of dobutamine. Midodrine also started. Heart failure managed with Lasix IV drip which has been transitioned to torsemide. ?-Heart failure recommendations: torsemide, midodrine ? ?Leukocytosis ?Uncertain etiology. No infectious etiology identified. Trended down. ? ?AKI on CKD stage IIIa ?Baseline creatinine is about 1.4. Peak creatinine of 1.89 this admission with slow downtrend on diuresis. Likely combination of hypotension/volume overload. ? ?Hypokalemia ?Potassium of 3.4 on BMP 4/26. Likely related to diuresis. Resolved with repletion and transition off of IV Lasix. ? ?PVCs ?-Continue amiodarone ? ?Right ankle pain ?No history of gout. Unsure of etiology. No injury noted. Symptoms started before admission after swelling developed. Associated edema but he has bilateral edema from heart failure. No redness of warmth. Patient was managed on loop diuretic therapy which could precipitate gout. Patient is able to ambulate, although painful. Given a dose of prednisone on 4/28 but patient states there was not much improvement. Reports pain has worsened in the last few days ?-Ankle X-ray; if significant effusion, could consider  diagnostic/therapeutic aspiration/injection ?-Symptom management for now ? ?DVT prophylaxis: Heparin subq ?Code Status:   Code Status: Full Code ?Family Communication: None at bedside ?Disposition Plan: Discharge pending heart failure team recommendations ? ? ?Consultants:  ?Cardiology/Advanced heart failure ? ?Procedures:  ?Transthoracic Echocardiogram (01/18/2022) ? ?Antimicrobials: ?None  ? ? ?Subjective: ?No real issues overnight. Still has right ankle pain especially when stepping on his foot.  ? ?Objective: ?BP 118/79 (BP Location: Left Arm)   Pulse 85   Temp (!) 97.4 ?F (36.3 ?C) (Oral)   Resp (!) 22   Ht 5\' 7"  (1.702 m)   Wt 72.8 kg   SpO2 99%   BMI 25.14 kg/m?  ? ?Examination: ? ?General exam: Appears calm and comfortable ?Respiratory system: Clear to auscultation. Respiratory effort normal. ?Cardiovascular system: S1 & S2 heard, RRR. 2/6 systolic murmur ?Gastrointestinal system: Abdomen is nondistended, soft and nontender. Normal bowel sounds heard. ?Central nervous system: Alert and oriented. No focal neurological deficits. ?Musculoskeletal: Bilateral LE edema. No calf tenderness. Moderate tenderness on palpation of right ankle ?Skin: No cyanosis. No rashes ?Psychiatry: Judgement and insight appear normal. Mood & affect appropriate.  ? ? ?Data Reviewed: I have personally reviewed following labs and imaging studies ? ?CBC ?Lab Results  ?Component Value Date  ? WBC 8.9 01/22/2022  ? RBC 4.69 01/22/2022  ? HGB 13.3 01/22/2022  ? HCT 40.9 01/22/2022  ? MCV 87.2 01/22/2022  ? MCH 28.4 01/22/2022  ? PLT 249 01/22/2022  ? MCHC 32.5 01/22/2022  ? RDW 15.3 01/22/2022  ? LYMPHSABS 0.5 (L) 01/18/2022  ? MONOABS 0.8 01/18/2022  ? EOSABS 0.0 01/18/2022  ? BASOSABS 0.0 01/18/2022  ? ? ? ?Last metabolic panel ?Lab Results  ?Component Value Date  ? NA 137 01/24/2022  ? K 3.9 01/24/2022  ? CL 105 01/24/2022  ?  CO2 25 01/24/2022  ? BUN 26 (H) 01/24/2022  ? CREATININE 1.53 (H) 01/24/2022  ? GLUCOSE 100 (H) 01/24/2022   ? GFRNONAA 52 (L) 01/24/2022  ? GFRAA >60 05/15/2020  ? CALCIUM 8.4 (L) 01/24/2022  ? PHOS 3.1 03/06/2020  ? PROT 5.4 (L) 01/19/2022  ? ALBUMIN 2.9 (L) 01/19/2022  ? BILITOT 1.3 (H) 01/19/2022  ? ALKPHOS 45 01/19/2022  ? AST 27 01/19/2022  ? ALT 37 01/19/2022  ? ANIONGAP 7 01/24/2022  ? ? ?GFR: ?Estimated Creatinine Clearance: 48.6 mL/min (A) (by C-G formula based on SCr of 1.53 mg/dL (H)). ? ?Recent Results (from the past 240 hour(s))  ?MRSA Next Gen by PCR, Nasal     Status: None  ? Collection Time: 01/17/22  7:32 PM  ? Specimen: Nasal Mucosa; Nasal Swab  ?Result Value Ref Range Status  ? MRSA by PCR Next Gen NOT DETECTED NOT DETECTED Final  ?  Comment: (NOTE) ?The GeneXpert MRSA Assay (FDA approved for NASAL specimens only), ?is one component of a comprehensive MRSA colonization surveillance ?program. It is not intended to diagnose MRSA infection nor to guide ?or monitor treatment for MRSA infections. ?Test performance is not FDA approved in patients less than 2 years ?old. ?Performed at [facility], [place] [address], [place], [place] ?[address] ?  ?Culture, blood (Routine X 2) w Reflex to ID Panel     Status: None  ? Collection Time: 01/18/22 10:41 AM  ? Specimen: BLOOD  ?Result Value Ref Range Status  ? Specimen Description BLOOD RIGHT ANTECUBITAL  Final  ? Special Requests   Final  ?  BOTTLES DRAWN AEROBIC AND ANAEROBIC Blood Culture adequate volume  ? Culture   Final  ?  NO GROWTH 5 DAYS ?Performed at [facility], [place] [address], [place], [place] [address] ?  ? Report Status 01/23/2022 FINAL  Final  ?Culture, blood (Routine X 2) w Reflex to ID Panel     Status: None  ? Collection Time: 01/18/22 10:45 AM  ? Specimen: BLOOD  ?Result Value Ref Range Status  ? Specimen Description BLOOD BLOOD RIGHT FOREARM  Final  ? Special Requests   Final  ?  BOTTLES DRAWN AEROBIC AND ANAEROBIC Blood Culture adequate volume  ? Culture   Final  ?  NO GROWTH 5 DAYS ?Performed at [facility], [place] [address], [place], [place] [address] ?  ? Report Status 01/23/2022 FINAL  Final  ?  ? ? ?Radiology Studies: ?No results found. ? ? ? LOS: 7 days  ? ? ?Cordelia Poche, MD ?[facility] ?01/24/2022, 9:47 AM ? ? ?If 7PM-7AM, please contact night-coverage [email or web address] ? ?

## 2022-01-24 NOTE — Plan of Care (Signed)

## 2022-01-25 ENCOUNTER — Inpatient Hospital Stay (HOSPITAL_COMMUNITY): Payer: Medicaid Other

## 2022-01-25 DIAGNOSIS — M25571 Pain in right ankle and joints of right foot: Secondary | ICD-10-CM

## 2022-01-25 LAB — COOXEMETRY PANEL
Carboxyhemoglobin: 1 % (ref 0.5–1.5)
Carboxyhemoglobin: 1.3 % (ref 0.5–1.5)
Methemoglobin: <0.7 % (ref 0.0–1.5)
Methemoglobin: <0.7 % (ref 0.0–1.5)
O2 Saturation: 52.9 %
O2 Saturation: 54.1 %
Total hemoglobin: 14.1 g/dL (ref 12.0–16.0)
Total hemoglobin: 14.6 g/dL (ref 12.0–16.0)

## 2022-01-25 LAB — BASIC METABOLIC PANEL
Anion gap: 11 (ref 5–15)
BUN: 35 mg/dL — ABNORMAL HIGH (ref 6–20)
CO2: 26 mmol/L (ref 22–32)
Calcium: 8.8 mg/dL — ABNORMAL LOW (ref 8.9–10.3)
Chloride: 104 mmol/L (ref 98–111)
Creatinine, Ser: 1.63 mg/dL — ABNORMAL HIGH (ref 0.61–1.24)
GFR, Estimated: 48 mL/min — ABNORMAL LOW (ref >60)
Glucose, Bld: 112 mg/dL — ABNORMAL HIGH (ref 70–99)
Potassium: 3.2 mmol/L — ABNORMAL LOW (ref 3.5–5.1)
Sodium: 141 mmol/L (ref 135–145)

## 2022-01-25 LAB — C-REACTIVE PROTEIN: CRP: 0.7 mg/dL (ref <1.0)

## 2022-01-25 LAB — SEDIMENTATION RATE: Sed Rate: 16 mm/hr (ref 0–16)

## 2022-01-25 LAB — MAGNESIUM: Magnesium: 2.3 mg/dL (ref 1.7–2.4)

## 2022-01-25 MED ORDER — POTASSIUM CHLORIDE CRYS ER 20 MEQ PO TBCR
40.0000 meq | EXTENDED_RELEASE_TABLET | Freq: Two times a day (BID) | ORAL | Status: DC
Start: 2022-01-25 — End: 2022-01-26
  Administered 2022-01-25 (×2): 40 meq via ORAL
  Filled 2022-01-25 (×2): qty 2

## 2022-01-25 MED ORDER — TORSEMIDE 20 MG PO TABS: 80.0000 mg | ORAL_TABLET | Freq: Every day | ORAL | Status: DC

## 2022-01-25 MED ORDER — POTASSIUM CHLORIDE CRYS ER 20 MEQ PO TBCR
40.0000 meq | EXTENDED_RELEASE_TABLET | Freq: Once | ORAL | Status: AC
  Administered 2022-01-25: 40 meq via ORAL
  Filled 2022-01-25: qty 2

## 2022-01-25 MED ORDER — METOLAZONE 2.5 MG PO TABS
5.0000 mg | ORAL_TABLET | Freq: Once | ORAL | Status: AC
  Administered 2022-01-25: 5 mg via ORAL
  Filled 2022-01-25: qty 2

## 2022-01-25 NOTE — Progress Notes (Addendum)
? ? Advanced Heart Failure Rounding Note ? ?PCP-Cardiologist: None  ? ?Subjective:   ? ?4/24 Cardiogenic shock. Started on DBA 3 mcg.  Also started on midodrine. Given 80 mg IV lasix and started on lasix drip. Diuresed 20+ lb.  ? ?CO-OX 53% off DBA.  ? ?CVP 13-14. ? ?Continues on IV lasix + 2.5 mg metolazone daily. 3.2L UOP yesterday. Weight down 6 lb overnight. Scr 1.35 -> 1.53 -> 1.63.  ? ?K 3.2, Mag 2.3. ? ?Breathing improved but still gets dyspneic intermittently. Multiple questions about fluid intake and management of HF. Continues to have right ankle discomfort. ? ? ?Objective:   ?Weight Range: ?70.1 kg ?Body mass index is 24.2 kg/m?.  ? ?Vital Signs:   ?Temp:  [97.6 ?F (36.4 ?C)-98 ?F (36.7 ?C)] 97.8 ?F (36.6 ?C) (05/01 0300) ?Pulse Rate:  [90-95] 93 (05/01 0300) ?Resp:  [17-20] 20 (05/01 0300) ?BP: (100-115)/(74-83) 100/74 (05/01 0300) ?SpO2:  [98 %-100 %] 100 % (05/01 0300) ?Weight:  [70.1 kg] 70.1 kg (05/01 0300) ?Last BM Date : 01/23/22 ? ?Weight change: ?Filed Weights  ? 01/23/22 0555 01/24/22 0433 01/25/22 0300  ?Weight: 73.7 kg 72.8 kg 70.1 kg  ? ? ?Intake/Output:  ?  ? ? ?Physical Exam  ? ?General:  No distress. Lying in bed. ?HEENT: normal ?Neck: supple. JVP 10-12. Carotids 2+ bilat; no bruits.  ?Cor: PMI nondisplaced. Regular rate & rhythm, tachy. No rubs, gallops or murmurs. ?Lungs: clear ?Abdomen: soft, nontender, nondistended. No hepatosplenomegaly.  ?Extremities: no cyanosis, clubbing, rash, edema, R ankle tender with swelling ?Neuro: alert & orientedx3, cranial nerves grossly intact. moves all 4 extremities w/o difficulty. Affect pleasant ? ? ? ?Telemetry  ? ?Sinus/sinus tach 90s-100s ? ?Labs  ?  ?CBC ?No results for input(s): WBC, NEUTROABS, HGB, HCT, MCV, PLT in the last 72 hours. ?Basic Metabolic Panel ?Recent Labs  ?  01/24/22 ?8502 01/25/22 ?0510  ?NA 137 141  ?K 3.9 3.2*  ?CL 105 104  ?CO2 25 26  ?GLUCOSE 100* 112*  ?BUN 26* 35*  ?CREATININE 1.53* 1.63*  ?CALCIUM 8.4* 8.8*  ?MG 2.5* 2.3   ? ?Liver Function Tests ?No results for input(s): AST, ALT, ALKPHOS, BILITOT, PROT, ALBUMIN in the last 72 hours. ? ?No results for input(s): LIPASE, AMYLASE in the last 72 hours. ?Cardiac Enzymes ?No results for input(s): CKTOTAL, CKMB, CKMBINDEX, TROPONINI in the last 72 hours. ? ?BNP: ?BNP (last 3 results) ?Recent Labs  ?  11/19/21 ?1423 12/21/21 ?1255 01/17/22 ?1400  ?BNP 588.9* 887.4* 2,334.9*  ? ? ?ProBNP (last 3 results) ?No results for input(s): PROBNP in the last 8760 hours. ? ? ?D-Dimer ?No results for input(s): DDIMER in the last 72 hours. ?Hemoglobin A1C ?No results for input(s): HGBA1C in the last 72 hours. ? ?Fasting Lipid Panel ?No results for input(s): CHOL, HDL, LDLCALC, TRIG, CHOLHDL, LDLDIRECT in the last 72 hours. ?Thyroid Function Tests ?No results for input(s): TSH, T4TOTAL, T3FREE, THYROIDAB in the last 72 hours. ? ?Invalid input(s): FREET3 ? ?Other results: ? ? ?Imaging  ? ? ?DG Ankle Complete Right ? ?Result Date: 01/24/2022 ?CLINICAL DATA:  RIGHT ankle swelling and pain. EXAM: RIGHT ANKLE - COMPLETE 3+ VIEW COMPARISON:  None. FINDINGS: No acute fracture, subluxation or dislocation identified. No joint effusion is noted. The joint spaces are unremarkable. No focal bony lesions are identified. Mild diffuse soft tissue swelling is noted. IMPRESSION: Mild diffuse soft tissue swelling. No acute bony abnormality or joint effusion. Electronically Signed   By: Cleatis Polka.D.  On: 01/24/2022 11:59   ? ? ?Medications:   ? ? ?Scheduled Medications: ? amiodarone  200 mg Oral BID  ? aspirin  81 mg Oral Daily  ? atorvastatin  40 mg Oral Daily  ? Chlorhexidine Gluconate Cloth  6 each Topical Daily  ? dapagliflozin propanediol  10 mg Oral Daily  ? digoxin  0.0625 mg Oral Daily  ? furosemide  80 mg Intravenous BID  ? heparin  5,000 Units Subcutaneous Q8H  ? metolazone  2.5 mg Oral Daily  ? midodrine  10 mg Oral TID WC  ? potassium chloride  40 mEq Oral Daily  ? sodium chloride flush  10-40 mL  Intracatheter Q12H  ? ? ?Infusions: ? ? ? ?PRN Medications: ?acetaminophen, sodium chloride, sodium chloride flush ? ? ? ?Patient Profile  ? ?  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 Cath with 1-v CAD with chronically occluded RCA. MRI in January 2011 with EF 31% with inferior scar.  ?  ?Admitted with A/C HFrEF-->shock.  ? ?Assessment/Plan  ? ?1. A/C HFrEF --> Shock suspect cardiogenic. ?Cath with 1-v CAD with chronically occluded RCA. MRI in January 2011 with EF 31% with inferior scar.  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  EF 45-50% on echo in 8/13.  ?- No f/u between to 2015-2021 ?- Echo 6/21 EF 20-25% severe central MR. Repeat cath 6/21 with stable CAD ?- Echo 07/07/20 EF 30-35% ?- Echo 01/05/21 EF 40-45% mild to mod MR RV mild HK  ?- Echo this admit 4/23 EF 30-35%.   ?- He had been off all meds for ~ 1 month.  ?- Started on DBA and lasix gtt w/ 20 lb diuresis.  ?- DBA off 04/27. CO-OX 53% this am. Recheck.   ?- Remains on IV lasix and 2.5 mg metolazone. Weight down 6 lb but CVP up to 13-14. Will give 5 mg metolazone today. ?- Renal function slightly worse 1.35 -> 1.53 -> 1.63 ?- ? Recurrent low output with rising CVP and Scr. Rechecking CO-OX as above. ?- Continue Farxiga 10 ?- Continue digoxin 0.125 ?- GDMT limited by low BP. Continue midodrine for BP support  ?- CR  ? ?2. AKI on CKD Stage IIIa ?- Due to cardiorenal syndrome ?- Creatinine baseline 1.4 -->admit 1.7-->1.9 -->-> 1.35 -> 1.53 ?-Renal US- no hydronephrosis.  ?  ?3. PVCs ?- Continue amio 200 mg twice a day.  ?- Well suppressed ? ?4. CAD  ? -Cath with 1-v CAD with chronically occluded RCA ?- HS Trop 15  ?- No s/s angina ?- Continue aspirin and atorvastatin.  ?  ?5. Hypokalemia  ? - K 3.2 today. ?- Supp with diuresis. ? ?6. Right ankle pain ?- Uric acid ok ?- No evidence septic arthritis ?- Some relief with prednisone x 1 but now  pain worse ?- Xray negative except for soft tissue swelling.  ?- TRH managing. ESR and CRP pending ? ?SDOH: ?Referred to Paramedicine.  ?Medicaid application started.  ?No insurance will need meds prior to d/c. HF Fund/Match.  ? ?Length of Stay: 8 ? ?FINCH, LINDSAY N, PA-C  ?01/25/2022, 8:35 AM ? ?Advanced Heart Failure Team ?Pager [phone] (M-F; 7a - 5p)  ?Please contact [facility] for night-coverage after hours (5p -7a ) and weekends on [email or web address] ? ?Patient seen and examined with the above-signed Advanced Practice Provider and/or Housestaff. I personally reviewed laboratory data, imaging studies and relevant notes.  I independently examined the patient and formulated the important aspects of the plan. I have edited the note to reflect any of my changes or salient points. I have personally discussed the plan with the patient and/or family. ? ?Coox stable off DBA. BP ok on midodrine. CVP rechecked personally 3-4. Denies SOB, orthopnea or PND ? ?General:  Sitting up in bed No resp difficulty ?HEENT: normal ?Neck: supple. no JVD. Carotids 2+ bilat; no bruits. No lymphadenopathy or thryomegaly appreciated. ?Cor: PMI nondisplaced. Regular rate & rhythm. No rubs, gallops or murmurs. ?Lungs: clear ?Abdomen: soft, nontender, nondistended. No hepatosplenomegaly. No bruits or masses. Good bowel sounds. ?Extremities: no cyanosis, clubbing, rash, edema ?Neuro: alert & orientedx3, cranial nerves grossly intact. moves all 4 extremities w/o difficulty. Affect pleasant ? ?He is euvolemic. Will switch to po diuretics. If co-ox and volume status stable may be able to go home soon.  ? ?Glori Bickers, MD  ?6:18 PM ? ? ? ?

## 2022-01-25 NOTE — Progress Notes (Signed)
? ?PROGRESS NOTE ? ? ? ?Zachary Arias  [id number] DOB: 1960-11-11 DOA: 01/17/2022 ?PCP: Default, Provider, MD ? ? ?Brief Narrative: ?Zachary Arias is a 61 y.o. male with a history of combined CHF, ischemic cardiomyopathy, CAD, hypertension, CKD stage IIIa, frequent PVCs on amiodarone. Patient presented secondary to shortness of breath and LE swelling, found to have evidence of acute heart failure. IV diuresis initiated. Advanced heart failure team consulted for resultant cardiogenic shock requiring initiation of dobutamine IV. ? ? ?Assessment and Plan: ? ?Acute on chronic combined systolic and diastolic heart failure ?Cardiogenic shock ?Advanced heart failure team consulted. BNP of 2,334 on admission. Patient initially managed on Lasix IV with subsequent hypotension, requiring initiation of dobutamine. Midodrine also started. Heart failure managed with Lasix IV drip which has been transitioned to torsemide. ?-Per cardiology ? ?Leukocytosis ?Uncertain etiology. No infectious etiology identified. Trended down. ? ?AKI on CKD stage IIIa ?Baseline creatinine is about 1.4. Peak creatinine of 1.89 this admission. Likely combination of hypotension/volume overload. Initial improvement with diuresis. Creatinine is now worsening with continued diuresis. ? ?Hypokalemia ?Potassium of 3.4 on BMP 4/26. Likely related to diuresis. Resolved with repletion and transition off of IV Lasix. ? ?PVCs ?-Continue amiodarone ? ?Right ankle pain ?No history of gout. Unsure of etiology. No injury noted. Symptoms started before admission after swelling developed. Associated edema but he has bilateral edema from heart failure. No redness of warmth. Patient was managed on loop diuretic therapy which could precipitate gout. Patient is able to ambulate, although painful. Given a dose of prednisone on 4/28 but patient states there was not much improvement. Point tenderness of distal fibula on exam. X-ray negative for effusion/fracture. ?-CT ankle  to rule out hairline fracture ?-Symptom management for now, ACE wrap ? ?DVT prophylaxis: Heparin subq ?Code Status:   Code Status: Full Code ?Family Communication: None at bedside ?Disposition Plan: Discharge pending heart failure team recommendations. Otherwise stable from general medicine management ? ? ?Consultants:  ?Cardiology/Advanced heart failure ? ?Procedures:  ?Transthoracic Echocardiogram (01/18/2022) ? ?Antimicrobials: ?None  ? ? ?Subjective: ?No real issues overnight. Still has right ankle pain especially when stepping on his foot.  ? ?Objective: ?BP 114/82 (BP Location: Left Arm)   Pulse 89   Temp 97.8 ?F (36.6 ?C) (Oral)   Resp 20   Ht 5\' 7"  (1.702 m)   Wt 70.1 kg   SpO2 96%   BMI 24.20 kg/m?  ? ?Examination: ? ?General exam: Appears calm and comfortable ?Respiratory system: Clear to auscultation. Respiratory effort normal. ?Cardiovascular system: S1 & S2 heard, RRR. 2+ systolic murmur ?Gastrointestinal system: Abdomen is nondistended, soft and nontender. Normal bowel sounds heard. ?Central nervous system: Alert and oriented. No focal neurological deficits. ?Musculoskeletal: Trace LE edema with 2+ ankle and foot edema. No calf tenderness. Significant point tenderness of lateral aspect of distal right fibula ?Skin: No cyanosis. No rashes ?Psychiatry: Judgement and insight appear normal. Mood & affect appropriate.   ? ? ?Data Reviewed: I have personally reviewed following labs and imaging studies ? ?CBC ?Lab Results  ?Component Value Date  ? WBC 8.9 01/22/2022  ? RBC 4.69 01/22/2022  ? HGB 13.3 01/22/2022  ? HCT 40.9 01/22/2022  ? MCV 87.2 01/22/2022  ? MCH 28.4 01/22/2022  ? PLT 249 01/22/2022  ? MCHC 32.5 01/22/2022  ? RDW 15.3 01/22/2022  ? LYMPHSABS 0.5 (L) 01/18/2022  ? MONOABS 0.8 01/18/2022  ? EOSABS 0.0 01/18/2022  ? BASOSABS 0.0 01/18/2022  ? ? ? ?Last metabolic panel ?Lab Results  ?  Component Value Date  ? NA 141 01/25/2022  ? K 3.2 (L) 01/25/2022  ? CL 104 01/25/2022  ? CO2 26 01/25/2022   ? BUN 35 (H) 01/25/2022  ? CREATININE 1.63 (H) 01/25/2022  ? GLUCOSE 112 (H) 01/25/2022  ? GFRNONAA 48 (L) 01/25/2022  ? GFRAA >60 05/15/2020  ? CALCIUM 8.8 (L) 01/25/2022  ? PHOS 3.1 03/06/2020  ? PROT 5.4 (L) 01/19/2022  ? ALBUMIN 2.9 (L) 01/19/2022  ? BILITOT 1.3 (H) 01/19/2022  ? ALKPHOS 45 01/19/2022  ? AST 27 01/19/2022  ? ALT 37 01/19/2022  ? ANIONGAP 11 01/25/2022  ? ? ?GFR: ?Estimated Creatinine Clearance: 45.6 mL/min (A) (by C-G formula based on SCr of 1.63 mg/dL (H)). ? ?Recent Results (from the past 240 hour(s))  ?MRSA Next Gen by PCR, Nasal     Status: None  ? Collection Time: 01/17/22  7:32 PM  ? Specimen: Nasal Mucosa; Nasal Swab  ?Result Value Ref Range Status  ? MRSA by PCR Next Gen NOT DETECTED NOT DETECTED Final  ?  Comment: (NOTE) ?The GeneXpert MRSA Assay (FDA approved for NASAL specimens only), ?is one component of a comprehensive MRSA colonization surveillance ?program. It is not intended to diagnose MRSA infection nor to guide ?or monitor treatment for MRSA infections. ?Test performance is not FDA approved in patients less than 2 years ?old. ?Performed at [facility], [place] [address] [place], [place] ?[address] ?  ?Culture, blood (Routine X 2) w Reflex to ID Panel     Status: None  ? Collection Time: 01/18/22 10:41 AM  ? Specimen: BLOOD  ?Result Value Ref Range Status  ? Specimen Description BLOOD RIGHT ANTECUBITAL  Final  ? Special Requests   Final  ?  BOTTLES DRAWN AEROBIC AND ANAEROBIC Blood Culture adequate volume  ? Culture   Final  ?  NO GROWTH 5 DAYS ?Performed at [facility], [place] [address] [place], [place] [address] ?  ? Report Status 01/23/2022 FINAL  Final  ?Culture, blood (Routine X 2) w Reflex to ID Panel     Status: None  ? Collection Time: 01/18/22 10:45 AM  ? Specimen: BLOOD  ?Result Value Ref Range Status  ? Specimen Description BLOOD BLOOD RIGHT FOREARM  Final  ? Special Requests   Final  ?  BOTTLES DRAWN AEROBIC AND ANAEROBIC Blood Culture adequate volume  ?  Culture   Final  ?  NO GROWTH 5 DAYS ?Performed at [facility], [place] [address] [place], [place] [address] ?  ? Report Status 01/23/2022 FINAL  Final  ?  ? ? ?Radiology Studies: ?DG Ankle Complete Right ? ?Result Date: 01/24/2022 ?CLINICAL DATA:  RIGHT ankle swelling and pain. EXAM: RIGHT ANKLE - COMPLETE 3+ VIEW COMPARISON:  None. FINDINGS: No acute fracture, subluxation or dislocation identified. No joint effusion is noted. The joint spaces are unremarkable. No focal bony lesions are identified. Mild diffuse soft tissue swelling is noted. IMPRESSION: Mild diffuse soft tissue swelling. No acute bony abnormality or joint effusion. Electronically Signed   By: Margarette Canada M.D.   On: 01/24/2022 11:59   ? ? ? LOS: 8 days  ? ? ?Cordelia Poche, MD ?[facility] ?01/25/2022, 11:38 AM ? ? ?If 7PM-7AM, please contact night-coverage ?[email or web address] ? ?

## 2022-01-26 ENCOUNTER — Inpatient Hospital Stay: Payer: Self-pay | Admitting: Family Medicine

## 2022-01-26 LAB — BASIC METABOLIC PANEL
Anion gap: 9 (ref 5–15)
BUN: 37 mg/dL — ABNORMAL HIGH (ref 6–20)
CO2: 26 mmol/L (ref 22–32)
Calcium: 9.5 mg/dL (ref 8.9–10.3)
Chloride: 106 mmol/L (ref 98–111)
Creatinine, Ser: 1.77 mg/dL — ABNORMAL HIGH (ref 0.61–1.24)
GFR, Estimated: 44 mL/min — ABNORMAL LOW (ref >60)
Glucose, Bld: 122 mg/dL — ABNORMAL HIGH (ref 70–99)
Potassium: 4.1 mmol/L (ref 3.5–5.1)
Sodium: 141 mmol/L (ref 135–145)

## 2022-01-26 LAB — COOXEMETRY PANEL
Carboxyhemoglobin: 1 % (ref 0.5–1.5)
Carboxyhemoglobin: 1.5 % (ref 0.5–1.5)
Methemoglobin: <0.7 % (ref 0.0–1.5)
Methemoglobin: <0.7 % (ref 0.0–1.5)
O2 Saturation: 48.9 %
O2 Saturation: 76.2 %
Total hemoglobin: 14.5 g/dL (ref 12.0–16.0)
Total hemoglobin: 14.6 g/dL (ref 12.0–16.0)

## 2022-01-26 LAB — CBC
HCT: 43.3 % (ref 39.0–52.0)
Hemoglobin: 13.9 g/dL (ref 13.0–17.0)
MCH: 28 pg (ref 26.0–34.0)
MCHC: 32.1 g/dL (ref 30.0–36.0)
MCV: 87.1 fL (ref 80.0–100.0)
Platelets: 410 10*3/uL — ABNORMAL HIGH (ref 150–400)
RBC: 4.97 MIL/uL (ref 4.22–5.81)
RDW: 15.6 % — ABNORMAL HIGH (ref 11.5–15.5)
WBC: 14 10*3/uL — ABNORMAL HIGH (ref 4.0–10.5)
nRBC: 0 % (ref 0.0–0.2)

## 2022-01-26 LAB — MAGNESIUM: Magnesium: 2.4 mg/dL (ref 1.7–2.4)

## 2022-01-26 LAB — DIGOXIN LEVEL: Digoxin Level: <0.2 ng/mL — ABNORMAL LOW (ref 0.8–2.0)

## 2022-01-26 MED ORDER — LACTATED RINGERS IV BOLUS
500.0000 mL | Freq: Once | INTRAVENOUS | Status: AC
  Administered 2022-01-26: 500 mL via INTRAVENOUS

## 2022-01-26 NOTE — TOC CM/SW Note (Addendum)
HF TOC CM rescheduled follow up appt with Primary Care at [facility] on 5/19 at 1000 am, clinic put notification in that pt needs assistance with transportation. Pt meds will be covered under HF Funds. Will need meds up from [facility] pharmacy at dc. Updated pt. Pt will need taxi voucher at time of dc to go home.  ?[name] RN3 CCM, Heart Failure TOC CM [phone]  ?

## 2022-01-26 NOTE — Progress Notes (Addendum)
? ?PROGRESS NOTE ? ? ? ?Zachary Arias  [id number] DOB: 1961/03/10 DOA: 01/17/2022 ?PCP: Default, Provider, MD ? ? ?Brief Narrative: ?Zachary Arias is a 61 y.o. male with a history of combined CHF, ischemic cardiomyopathy, CAD, hypertension, CKD stage IIIa, frequent PVCs on amiodarone. Patient presented secondary to shortness of breath and LE swelling, found to have evidence of acute heart failure. IV diuresis initiated. Advanced heart failure team consulted for resultant cardiogenic shock requiring initiation of dobutamine IV and Lasix drip. Patient with slow improvement from heart failure team standpoint. While admitted, patient expressed concern for right ankle pain. Found to have probably tendon tear. ? ? ?Assessment and Plan: ? ?Acute on chronic combined systolic and diastolic heart failure ?Cardiogenic shock ?Advanced heart failure team consulted. BNP of 2,334 on admission. Patient initially managed on Lasix IV with subsequent hypotension, requiring initiation of dobutamine. Midodrine also started. Heart failure managed with Lasix IV drip which transitioned to torsemide before transitioning back to Lasix IV. Lasix now discontinued. ?-Per cardiology ? ?Leukocytosis ?Uncertain etiology. No infectious etiology identified. WBC trended down initially but has rebounded in setting of one dose of steroids. Anticipate this will resolve. ? ?AKI on CKD stage IIIa ?Baseline creatinine is about 1.4. Peak creatinine of 1.89 this admission. Likely combination of hypotension/volume overload. Initial improvement with diuresis. Creatinine is now worsening with continued diuresis. ? ?Hypokalemia ?Potassium of 3.4 on BMP 4/26. Likely related to diuresis. Resolved with repletion and transition off of IV Lasix. ? ?PVCs ?-Continue amiodarone ? ?Right ankle pain ?No history of gout. Unsure of etiology. No injury noted. Symptoms started before admission after swelling developed. Associated edema but he has bilateral edema from heart  failure. No redness of warmth. Patient was managed on loop diuretic therapy which could precipitate gout. Patient is able to ambulate, although painful. Given a dose of prednisone on 4/28 but patient states there was not much improvement. Point tenderness of distal fibula on exam. X-ray negative for effusion/fracture. CT scan suggests possible partial-thickness longitudinal tear of peroneus brevis tendon. Discussed with on-call ortho who recommended outpatient follow-up. MRI would be helpful and can be done in-/outpatient. ?-Will order MRI. If patient ends up being medically stable for discharge today prior to MRI, can obtain MRI as an outpatient ?-Symptom management for now, ACE wrap ?-Outpatient follow-up with Dr. Marcelino Scot ? ?DVT prophylaxis: Heparin subq ?Code Status:   Code Status: Full Code ?Family Communication: None at bedside ?Disposition Plan: Discharge pending heart failure team recommendations. Otherwise stable from general medicine management ? ? ?Consultants:  ?Cardiology/Advanced heart failure ? ?Procedures:  ?Transthoracic Echocardiogram (01/18/2022) ? ?Antimicrobials: ?None  ? ? ?Subjective: ?Foot pain is better after ACE bandage application.  ? ?Objective: ?BP 111/87 (BP Location: Left Arm)   Pulse (!) 102   Temp (!) 97.4 ?F (36.3 ?C) (Oral)   Resp 17   Ht 5\' 7"  (1.702 m)   Wt 69.3 kg   SpO2 92%   BMI 23.93 kg/m?  ? ?Examination: ? ?General exam: Appears calm and comfortable ?Respiratory system: Clear to auscultation. Respiratory effort normal. ?Cardiovascular system: S1 & S2 heard, RRR. ?Gastrointestinal system: Abdomen is nondistended, soft and nontender.  Normal bowel sounds heard. ?Central nervous system: Alert and oriented. No focal neurological deficits. ?Musculoskeletal: 1+ BLE edema. No calf tenderness. ACE bandage applied to right ankle ?Skin: No cyanosis. No rashes ?Psychiatry: Judgement and insight appear normal. Mood & affect appropriate.  ? ? ?Data Reviewed: I have personally  reviewed following labs and imaging studies ? ?CBC ?Lab  Results  ?Component Value Date  ? WBC 14.0 (H) 01/26/2022  ? RBC 4.97 01/26/2022  ? HGB 13.9 01/26/2022  ? HCT 43.3 01/26/2022  ? MCV 87.1 01/26/2022  ? MCH 28.0 01/26/2022  ? PLT 410 (H) 01/26/2022  ? MCHC 32.1 01/26/2022  ? RDW 15.6 (H) 01/26/2022  ? LYMPHSABS 0.5 (L) 01/18/2022  ? MONOABS 0.8 01/18/2022  ? EOSABS 0.0 01/18/2022  ? BASOSABS 0.0 01/18/2022  ? ? ? ?Last metabolic panel ?Lab Results  ?Component Value Date  ? NA 141 01/26/2022  ? K 4.1 01/26/2022  ? CL 106 01/26/2022  ? CO2 26 01/26/2022  ? BUN 37 (H) 01/26/2022  ? CREATININE 1.77 (H) 01/26/2022  ? GLUCOSE 122 (H) 01/26/2022  ? GFRNONAA 44 (L) 01/26/2022  ? GFRAA >60 05/15/2020  ? CALCIUM 9.5 01/26/2022  ? PHOS 3.1 03/06/2020  ? PROT 5.4 (L) 01/19/2022  ? ALBUMIN 2.9 (L) 01/19/2022  ? BILITOT 1.3 (H) 01/19/2022  ? ALKPHOS 45 01/19/2022  ? AST 27 01/19/2022  ? ALT 37 01/19/2022  ? ANIONGAP 9 01/26/2022  ? ? ?GFR: ?Estimated Creatinine Clearance: 42 mL/min (A) (by C-G formula based on SCr of 1.77 mg/dL (H)). ? ?Recent Results (from the past 240 hour(s))  ?MRSA Next Gen by PCR, Nasal     Status: None  ? Collection Time: 01/17/22  7:32 PM  ? Specimen: Nasal Mucosa; Nasal Swab  ?Result Value Ref Range Status  ? MRSA by PCR Next Gen NOT DETECTED NOT DETECTED Final  ?  Comment: (NOTE) ?The GeneXpert MRSA Assay (FDA approved for NASAL specimens only), ?is one component of a comprehensive MRSA colonization surveillance ?program. It is not intended to diagnose MRSA infection nor to guide ?or monitor treatment for MRSA infections. ?Test performance is not FDA approved in patients less than 2 years ?old. ?Performed at [facility], [place] [address] [place], [place] ?[address] ?  ?Culture, blood (Routine X 2) w Reflex to ID Panel     Status: None  ? Collection Time: 01/18/22 10:41 AM  ? Specimen: BLOOD  ?Result Value Ref Range Status  ? Specimen Description BLOOD RIGHT ANTECUBITAL  Final  ? Special  Requests   Final  ?  BOTTLES DRAWN AEROBIC AND ANAEROBIC Blood Culture adequate volume  ? Culture   Final  ?  NO GROWTH 5 DAYS ?Performed at [facility], [place] [address], [place], [place] [address] ?  ? Report Status 01/23/2022 FINAL  Final  ?Culture, blood (Routine X 2) w Reflex to ID Panel     Status: None  ? Collection Time: 01/18/22 10:45 AM  ? Specimen: BLOOD  ?Result Value Ref Range Status  ? Specimen Description BLOOD BLOOD RIGHT FOREARM  Final  ? Special Requests   Final  ?  BOTTLES DRAWN AEROBIC AND ANAEROBIC Blood Culture adequate volume  ? Culture   Final  ?  NO GROWTH 5 DAYS ?Performed at [facility], [place] [address], [place], [place] [address] ?  ? Report Status 01/23/2022 FINAL  Final  ?  ? ? ?Radiology Studies: ?DG Ankle Complete Right ? ?Result Date: 01/24/2022 ?CLINICAL DATA:  RIGHT ankle swelling and pain. EXAM: RIGHT ANKLE - COMPLETE 3+ VIEW COMPARISON:  None. FINDINGS: No acute fracture, subluxation or dislocation identified. No joint effusion is noted. The joint spaces are unremarkable. No focal bony lesions are identified. Mild diffuse soft tissue swelling is noted. IMPRESSION: Mild diffuse soft tissue swelling. No acute bony abnormality or joint effusion.  Electronically Signed   By: Margarette Canada M.D.   On: 01/24/2022 11:59  ? ?CT ANKLE RIGHT WO CONTRAST ? ?Result Date: 01/25/2022 ?CLINICAL DATA:  Ankle pain.  Negative x-ray. EXAM: CT OF THE RIGHT ANKLE WITHOUT CONTRAST TECHNIQUE: Multidetector CT imaging of the right ankle was performed according to the standard protocol. Multiplanar CT image reconstructions were also generated. RADIATION DOSE REDUCTION: This exam was performed according to the departmental dose-optimization program which includes automated exposure control, adjustment of the mA and/or kV according to patient size and/or use of iterative reconstruction technique. COMPARISON:  Right ankle radiographs 01/24/2022 FINDINGS: Bones/Joint/Cartilage There is moderate chronic  spurring at the Achilles insertion on the calcaneus. Mild talonavicular joint space narrowing and dorsal talar greater than navicular degenerative osteophytosis. Minimal dorsal first tarsometatarsal degenerative spurring

## 2022-01-26 NOTE — Progress Notes (Addendum)
? ? Advanced Heart Failure Rounding Note ? ?PCP-Cardiologist: None  ? ?Subjective:   ? ?4/24 Cardiogenic shock. Started on DBA 3 mcg.  Also started on midodrine. Given 80 mg IV lasix and started on lasix drip. Diuresed 20+ lb.  ? ?Yesterday diuresed with IV lasix + metolazone. Negative 1.8 liters. Overall weight down 31 pounds.  ? ?Creatinine trending up 1.5>1.6>1.8  ? ?CO-OX 49%->repeat ? ?Feels ok. Denies SOB.  ? ? ?Objective:   ?Weight Range: ?69.3 kg ?Body mass index is 23.93 kg/m?.  ? ?Vital Signs:   ?Temp:  [97.4 ?F (36.3 ?C)-99 ?F (37.2 ?C)] 97.4 ?F (36.3 ?C) (05/02 7902) ?Pulse Rate:  [89-103] 102 (05/02 0712) ?Resp:  [16-20] 17 (05/02 4097) ?BP: (107-117)/(80-90) 111/87 (05/02 3532) ?SpO2:  [92 %-99 %] 92 % (05/02 0712) ?Weight:  [69.3 kg] 69.3 kg (05/02 0335) ?Last BM Date : 01/23/22 ? ?Weight change: ?Filed Weights  ? 01/24/22 0433 01/25/22 0300 01/26/22 0335  ?Weight: 72.8 kg 70.1 kg 69.3 kg  ? ? ?Intake/Output:  ?  ?CVP 1-2 personally checked.  ? ?Physical Exam  ?General:  . No resp difficulty ?HEENT: normal ?Neck: supple. no JVD. Carotids 2+ bilat; no bruits. No lymphadenopathy or thryomegaly appreciated. ?Cor: PMI nondisplaced. Regular rate & rhythm. No rubs, gallops or murmurs. ?Lungs: clear ?Abdomen: soft, nontender, nondistended. No hepatosplenomegaly. No bruits or masses. Good bowel sounds. ?Extremities: no cyanosis, clubbing, rash, Ted hose on R/LLE. RUE PICC ?Neuro: alert & orientedx3, cranial nerves grossly intact. moves all 4 extremities w/o difficulty. Affect pleasant ? ? ?Telemetry  ? ?SR-ST 90-100s occasional PVCs.  ? ?Labs  ?  ?CBC ?Recent Labs  ?  01/26/22 ?0500  ?WBC 14.0*  ?HGB 13.9  ?HCT 43.3  ?MCV 87.1  ?PLT 410*  ? ?Basic Metabolic Panel ?Recent Labs  ?  01/25/22 ?0510 01/26/22 ?0500  ?NA 141 141  ?K 3.2* 4.1  ?CL 104 106  ?CO2 26 26  ?GLUCOSE 112* 122*  ?BUN 35* 37*  ?CREATININE 1.63* 1.77*  ?CALCIUM 8.8* 9.5  ?MG 2.3 2.4  ? ?Liver Function Tests ?No results for input(s): AST,  ALT, ALKPHOS, BILITOT, PROT, ALBUMIN in the last 72 hours. ? ?No results for input(s): LIPASE, AMYLASE in the last 72 hours. ?Cardiac Enzymes ?No results for input(s): CKTOTAL, CKMB, CKMBINDEX, TROPONINI in the last 72 hours. ? ?BNP: ?BNP (last 3 results) ?Recent Labs  ?  11/19/21 ?1423 12/21/21 ?1255 01/17/22 ?1400  ?BNP 588.9* 887.4* 2,334.9*  ? ? ?ProBNP (last 3 results) ?No results for input(s): PROBNP in the last 8760 hours. ? ? ?D-Dimer ?No results for input(s): DDIMER in the last 72 hours. ?Hemoglobin A1C ?No results for input(s): HGBA1C in the last 72 hours. ? ?Fasting Lipid Panel ?No results for input(s): CHOL, HDL, LDLCALC, TRIG, CHOLHDL, LDLDIRECT in the last 72 hours. ?Thyroid Function Tests ?No results for input(s): TSH, T4TOTAL, T3FREE, THYROIDAB in the last 72 hours. ? ?Invalid input(s): FREET3 ? ?Other results: ? ? ?Imaging  ? ? ?CT ANKLE RIGHT WO CONTRAST ? ?Result Date: 01/25/2022 ?CLINICAL DATA:  Ankle pain.  Negative x-ray. EXAM: CT OF THE RIGHT ANKLE WITHOUT CONTRAST TECHNIQUE: Multidetector CT imaging of the right ankle was performed according to the standard protocol. Multiplanar CT image reconstructions were also generated. RADIATION DOSE REDUCTION: This exam was performed according to the departmental dose-optimization program which includes automated exposure control, adjustment of the mA and/or kV according to patient size and/or use of iterative reconstruction technique. COMPARISON:  Right ankle radiographs 01/24/2022 FINDINGS:  Bones/Joint/Cartilage There is moderate chronic spurring at the Achilles insertion on the calcaneus. Mild talonavicular joint space narrowing and dorsal talar greater than navicular degenerative osteophytosis. Minimal dorsal first tarsometatarsal degenerative spurring. Ligaments Suboptimally assessed by CT. Muscles and Tendons The tendons are not well evaluated on this modality. Within this limitation, there appears to be a possible "chevron configuration of the  peroneus brevis tendon raising the question of a possible partial-thickness longitudinal tear at the level of the distal fibula (axial series 4, images 96 through 114). Soft tissues There is mild to moderate dorsal midfoot and mild lateral greater than medial ankle subcutaneous fat edema and swelling. IMPRESSION:: IMPRESSION: 1. No acute fracture is seen. 2. Moderate chronic spurring at the Achilles insertion on the calcaneus. 3. Mild-to-moderate dorsal midfoot and mild lateral greater than medial ankle subcutaneous fat edema and swelling. 4. Question possible partial-thickness longitudinal tear of the peroneus brevis tendon at the distal aspect of the fibula. The tendons are not well evaluated on this modality. If clinically indicated, MRI of the ankle may help further evaluate the peroneal tendons. Electronically Signed   By: Yvonne Kendall M.D.   On: 01/25/2022 17:04   ? ? ?Medications:   ? ? ?Scheduled Medications: ? amiodarone  200 mg Oral BID  ? aspirin  81 mg Oral Daily  ? atorvastatin  40 mg Oral Daily  ? Chlorhexidine Gluconate Cloth  6 each Topical Daily  ? dapagliflozin propanediol  10 mg Oral Daily  ? digoxin  0.0625 mg Oral Daily  ? heparin  5,000 Units Subcutaneous Q8H  ? midodrine  10 mg Oral TID WC  ? potassium chloride  40 mEq Oral BID  ? sodium chloride flush  10-40 mL Intracatheter Q12H  ? torsemide  80 mg Oral Daily  ? ? ?Infusions: ? ? ? ?PRN Medications: ?acetaminophen, sodium chloride, sodium chloride flush ? ? ? ?Patient Profile  ? ?  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 Cath with 1-v CAD with chronically occluded RCA. MRI in January 2011 with EF 31% with inferior scar.  ?  ?Admitted with A/C HFrEF-->shock.  ? ?Assessment/Plan  ? ?1. A/C HFrEF --> Shock suspect cardiogenic. ?Cath with 1-v CAD with chronically occluded RCA. MRI in January 2011 with EF 31% with inferior scar.  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  EF 45-50% on echo in 8/13.  ?- No f/u between to 2015-2021 ?- Echo 6/21 EF 20-25% severe central MR. Repeat cath 6/21 with stable CAD ?- Echo 07/07/20 EF 30-35% ?- Echo 01/05/21 EF 40-45% mild to mod MR RV mild HK  ?- Echo this admit 4/23 EF 30-35%.   ?- He had been off all meds for ~ 1 month.  ?- Started on DBA and lasix gtt w/ 20 lb diuresis.  ?- DBA off 04/27. CO-OX 49% . Repeat. ?- CVP 1-2. Hold torsemide.  ?- Renal function slightly worse 1.35 -> 1.53 -> 1.63->1.8  ?- Continue Farxiga 10 mg daily  ?- Continue digoxin 0.125 ?- GDMT limited by low BP. Continue midodrine for BP support  ? ?2. AKI on CKD Stage IIIa ?- Due to cardiorenal syndrome ?- Creatinine baseline 1.4 -->admit 1.7-->1.9 -->-> 1.35 -> 1.53->1.8 ?-Renal US- no hydronephrosis.  ?- Hold torsemide today.  ?  ?3. PVCs ?- Continue amio 200 mg twice a day.  ?- Well suppressed ? ?4. CAD  ? -Cath with 1-v CAD  with chronically occluded RCA ?- HS Trop 15  ?- No s/s angina ?- Continue aspirin and atorvastatin.  ?  ?5. Hypokalemia  ? - K 4.1 today. ?- Supp with diuresis. ? ?6. Right ankle pain ?- Uric acid ok ?- No evidence septic arthritis ?- Some relief with prednisone x 1  ?- Xray negative except for soft tissue swelling.  ?- TRH managing. ESR and CRP pending ? ?SDOH: ?Referred to Paramedicine.  ?Medicaid application started.  ?No insurance will need meds prior to d/c. HF Fund/Match.  ? ?Length of Stay: 9 ? ?Darrick Grinder, NP  ?01/26/2022, 7:45 AM ? ?Advanced Heart Failure Team ?Pager [phone] (M-F; 7a - 5p)  ?Please contact [facility] for night-coverage after hours (5p -7a ) and weekends on [email or web address] ? ? ?Patient seen and examined with the above-signed Advanced Practice Provider and/or Housestaff. I personally reviewed laboratory data, imaging studies and relevant notes. I independently examined the patient and formulated the important aspects of the plan. I have edited the note to reflect any of  my changes or salient points. I have personally discussed the plan with the patient and/or family. ? ? ?Has developed AKI in setting of low CVP and dropping co-ox. Denies CP or SOB.  ? ?General:  Well appearing. No resp di

## 2022-01-26 NOTE — Progress Notes (Signed)
Orthopedic Tech Progress Note ?Patient Details:  ?Zachary Arias ?April 01, 1961 ?[id number] ? ?Went to apply CAM WALKER to patient, waited about patient was still tied up, notified RN will return at my nearest convenience ? ? Patient ID: Zachary Arias, male   DOB: 04-Jul-1961, 61 y.o.   MRN: [id number] ? ?Zachary Arias ?01/26/2022, 8:56 AM ? ?

## 2022-01-26 NOTE — Progress Notes (Signed)
Orthopedic Tech Progress Note ?Patient Details:  ?Zachary Arias ?[birth year] ?[id number] ? ?Ortho Devices ?Type of Ortho Device: CAM walker ?Ortho Device/Splint Location: RLE ?Ortho Device/Splint Interventions: Ordered, Application, Adjustment, Removal ?  ?Post Interventions ?Patient Tolerated: Well ?Instructions Provided: Care of device ? ?Zachary Arias ?01/26/2022, 11:49 AM ? ?

## 2022-01-27 ENCOUNTER — Inpatient Hospital Stay (HOSPITAL_COMMUNITY): Payer: Medicaid Other

## 2022-01-27 DIAGNOSIS — M25571 Pain in right ankle and joints of right foot: Secondary | ICD-10-CM | POA: Diagnosis present

## 2022-01-27 DIAGNOSIS — N189 Chronic kidney disease, unspecified: Secondary | ICD-10-CM

## 2022-01-27 DIAGNOSIS — I509 Heart failure, unspecified: Secondary | ICD-10-CM

## 2022-01-27 DIAGNOSIS — E785 Hyperlipidemia, unspecified: Secondary | ICD-10-CM | POA: Diagnosis present

## 2022-01-27 LAB — BASIC METABOLIC PANEL
Anion gap: 10 (ref 5–15)
BUN: 36 mg/dL — ABNORMAL HIGH (ref 6–20)
CO2: 25 mmol/L (ref 22–32)
Calcium: 9.6 mg/dL (ref 8.9–10.3)
Chloride: 107 mmol/L (ref 98–111)
Creatinine, Ser: 1.67 mg/dL — ABNORMAL HIGH (ref 0.61–1.24)
GFR, Estimated: 47 mL/min — ABNORMAL LOW (ref >60)
Glucose, Bld: 133 mg/dL — ABNORMAL HIGH (ref 70–99)
Potassium: 3.9 mmol/L (ref 3.5–5.1)
Sodium: 142 mmol/L (ref 135–145)

## 2022-01-27 LAB — COOXEMETRY PANEL
Carboxyhemoglobin: 0.7 % (ref 0.5–1.5)
Carboxyhemoglobin: 0.8 % (ref 0.5–1.5)
Carboxyhemoglobin: 0.7 % (ref 0.5–1.5)
Methemoglobin: <0.7 % (ref 0.0–1.5)
Methemoglobin: <0.7 % (ref 0.0–1.5)
Methemoglobin: <0.7 % (ref 0.0–1.5)
O2 Saturation: 43.6 %
O2 Saturation: 42 %
O2 Saturation: 44.8 %
Total hemoglobin: 14.5 g/dL (ref 12.0–16.0)
Total hemoglobin: 14.3 g/dL (ref 12.0–16.0)
Total hemoglobin: 13.9 g/dL (ref 12.0–16.0)

## 2022-01-27 LAB — CBC
HCT: 42.9 % (ref 39.0–52.0)
Hemoglobin: 14.1 g/dL (ref 13.0–17.0)
MCH: 29 pg (ref 26.0–34.0)
MCHC: 32.9 g/dL (ref 30.0–36.0)
MCV: 88.3 fL (ref 80.0–100.0)
Platelets: 424 10*3/uL — ABNORMAL HIGH (ref 150–400)
RBC: 4.86 MIL/uL (ref 4.22–5.81)
RDW: 15.5 % (ref 11.5–15.5)
WBC: 13.8 10*3/uL — ABNORMAL HIGH (ref 4.0–10.5)
nRBC: 0 % (ref 0.0–0.2)

## 2022-01-27 LAB — MAGNESIUM: Magnesium: 2.3 mg/dL (ref 1.7–2.4)

## 2022-01-27 LAB — GLUCOSE, CAPILLARY: Glucose-Capillary: 137 mg/dL — ABNORMAL HIGH (ref 70–99)

## 2022-01-27 MED ORDER — DOBUTAMINE IN D5W 4-5 MG/ML-% IV SOLN
5.0000 ug/kg/min | INTRAVENOUS | Status: DC
  Administered 2022-01-27: 2.5 ug/kg/min via INTRAVENOUS
  Administered 2022-01-30 – 2022-02-02 (×2): 5 ug/kg/min via INTRAVENOUS
  Filled 2022-01-27 (×4): qty 250

## 2022-01-27 NOTE — Assessment & Plan Note (Signed)
Reactive leukocytosis.  ?Pain did not respond to prednisone.  ?Plan for outpatient MRI follow up with orthopedics.  ?

## 2022-01-27 NOTE — Hospital Course (Addendum)
Zachary Arias was admitted to the hospital with the working diagnosis of decompensated heart failure.  ?Now he is inotropic dependant, but having difficulty getting access to outpatient therapy due to lack of insurance.  ? ?Zachary Arias is a 61 y.o. male with a history of combined CHF, ischemic cardiomyopathy, CAD, hypertension, CKD stage IIIa, frequent PVCs on amiodarone. Patient presented secondary to shortness of breath and LE swelling.  Over the last 2 weeks prior to hospitalization he developed worsening dyspnea and lower extremity edema, refractive to increasing dose and frequency of furosemide. Positive weight gain, more than 8 lbs over last 8 weeks. On his initial physical examination his blood pressure was 125/91, HR 106, RR 16 and 02 saturation 97% on room air. Lungs with bilateral rales at bases, heart with S1 and S2 present and rhythmic, abdomen not distended, positive lower extremity edema ++.  ? ?Na 143, K 4,3 CL 110, bicarbonate 21 glucose 128, bun 19 cr 1,69 ?BNP 2,334  ?Lactic acid 1,9 and 2,7  ?Wbc 18,8, hgb 14,8 plt 262  ? ?Chest radiograph with cardiomegaly, bilateral hilar vascular congestion, positive fluid in the right fissures.  ? ?EKG 102 bpm, normal axis, normal intervals, sinus rhythm with left atrial enlargement, poor R wave progression, no significant ST segment or T wave changes.  ? ?Patient was placed on furosemide for diuresis, developed low output failure (cardiogenic shock), requiring inotropic support.  ? ?While admitted, patient expressed concern for right ankle pain. Found to have probably tendon tear. ? ?Volume status has improved and now transitioned to oral diuretic therapy.  ?Not able to wean off inotropic support.  ?Plan for possible dc home with inotropic infusion, but lack of insurance has been limiting access to outpatient therapy.  ?

## 2022-01-27 NOTE — Plan of Care (Signed)
  Problem: Education: Goal: Knowledge of General Education information will improve Description: Including pain rating scale, medication(s)/side effects and non-pharmacologic comfort measures Outcome: Progressing   Problem: Health Behavior/Discharge Planning: Goal: Ability to manage health-related needs will improve Outcome: Progressing   Problem: Clinical Measurements: Goal: Will remain free from infection Outcome: Progressing   Problem: Activity: Goal: Risk for activity intolerance will decrease Outcome: Progressing   Problem: Nutrition: Goal: Adequate nutrition will be maintained Outcome: Progressing   Problem: Coping: Goal: Level of anxiety will decrease Outcome: Progressing   Problem: Elimination: Goal: Will not experience complications related to bowel motility Outcome: Progressing Goal: Will not experience complications related to urinary retention Outcome: Progressing   Problem: Pain Managment: Goal: General experience of comfort will improve Outcome: Progressing   Problem: Safety: Goal: Ability to remain free from injury will improve Outcome: Progressing   Problem: Skin Integrity: Goal: Risk for impaired skin integrity will decrease Outcome: Progressing   

## 2022-01-27 NOTE — Assessment & Plan Note (Signed)
Continue with statin therapy.  ?

## 2022-01-27 NOTE — Assessment & Plan Note (Addendum)
CKd stage 3a. Hypokalemia, hypernatremia.  ? ?Patient tolerating well inotropic support and diuresis ?Follow up renal function today with serum cr at 1,64 with K at 4,7 and serum bicarbonate at 30. ? ?Continue diuresis with torsemide and today added spironolactone.  ?Continue K supplementation with KCl 40 meq bid.  ?Follow up renal function and electrolytes in am. ?Avoid hypotension and nephrotoxic medications.  ?

## 2022-01-27 NOTE — Progress Notes (Signed)
? ?  Patient continues to feel very weak and more SOB. Co-ox persistently in the low 40% range consistent with low output HF/cardiogenic shock.  ? ?His extremities are cool on exam.  ? ?BP/afterload is elevated on midodrine.  ? ?Will stop midodrine and restart dobutamine at 2.5 ? ?Will need to consider advanced therapies though compliance has been a major issue for him.  ? ?CCT 35 mins.  ? ?Glori Bickers, MD  ?8:53 PM ? ?

## 2022-01-27 NOTE — Assessment & Plan Note (Addendum)
Acute on chronic systolic heart failure.  ?Pulmonary hypertension acute on chronic core pulmonale.  ? ?Echocardiogram with reduced LV systolic function with EF 30 to 35%. Severe dilatation of internal cavity. Mild enlargement of RV. Severe elevation in pulmonary artery systolic pressure, RVSP 64 mmHg. Severe dilatation of left atrium. Posterior trivial pericardial effusion. Moderate TR.  ? ?Urine output over last 24 hrs is 3,700 ml. ?Systolic blood pressure [id number] to 100 mmHg range.  ?SV02 52.8 ? ?On dobutamine infusion at 5 mcg/kg/min ?Metolazone 2,5 mg 05/05.  ?Continue with torsemide 40 mg bid  ?On dapagliflozin and digoxin  ?Today added spironolactone.  ? ?PVC, and ectopy continue with amiodarone with good toleration.   ? ?Patient with end-stage HF. Not able to get VAD due to lack of insurance and not a [place] citizen.  ?Follow up with palliative care.  ?

## 2022-01-27 NOTE — Progress Notes (Signed)
?  Mobility Specialist Criteria Algorithm Info. ? ? ? 01/27/22 1530  ?Oxygen Therapy  ?SpO2 95 %  ?O2 Device Room Air  ?Patient Activity (if Appropriate) Ambulating  ?Mobility  ?Activity Ambulated with assistance in hallway  ?Range of Motion/Exercises Active;All extremities  ?Level of Assistance Standby assist, set-up cues, supervision of patient - no hands on  ?Assistive Device Other (Comment) ?(IV Pole)  ?Distance Ambulated (ft) 500 ft  ?Activity Response Tolerated well  ? ?Patient received in supine. Was initially anxious and slightly reluctant but agreed to participate with encouragement. Required max A + education to donn CAM boot. Also, completed education on energy conservation and pursed lip breathing with receptive teach back. Ambulated in hallway with steady gait. Tolerated ambulating on room air, maintaining >93% throughout ambulation. Returned to room without complaint or incident. Was left dangling EOB with all needs met, call bell in reach. ? ?01/27/2022 ?3:57 PM ? ?Martinique Tripp, CMS, BS EXP ?Acute Rehabilitation Services  ?JSRPR:[phone] ?Office: [phone] ? ?

## 2022-01-27 NOTE — Progress Notes (Signed)
?  Progress Note ? ? ?Patient: Zachary Arias [id number] DOB: 1961-03-18 DOA: 01/17/2022     10 ?DOS: the patient was seen and examined on 01/27/2022 ?  ?Brief hospital course: ?Zachary Arias is a 61 y.o. male with a history of combined CHF, ischemic cardiomyopathy, CAD, hypertension, CKD stage IIIa, frequent PVCs on amiodarone. Patient presented secondary to shortness of breath and LE swelling, found to have evidence of acute heart failure. IV diuresis initiated. Advanced heart failure team consulted for resultant cardiogenic shock requiring initiation of dobutamine IV and Lasix drip. Patient with slow improvement from heart failure team standpoint. While admitted, patient expressed concern for right ankle pain. Found to have probably tendon tear. ? ?Clinically improving volume status.  ?Inotropic support has been discontinued.  ? ? ?Assessment and Plan: ?Congestive heart failure ([name]) ?Acute on chronic systolic heart failure.  ?Pulmonary hypertension acute on chronic core pulmonale.  ? ?Cardiogenic shock resolved.  ? ?Echocardiogram with reduced LV systolic function with EF 30 to 35%. Severe dilatation of internal cavity. Mild enlargement of RV. Severe elevation in pulmonary artery systolic pressure, RVSP 64 mmHg. Severe dilatation of left atrium. Posterior trivial pericardial effusion. Moderate TR.  ? ?Urine output over last 24 hrs is 625 ml. ?Net fluid balance since admission is negative 23,325 ml.  ? ?Plan to continue medical therapy with dogoxin, dapagliflozin and blood pressure support with midodrine. ? ?For PVC, ectopy started on amiodarone with good toleration.   ? ?Acute kidney injury superimposed on chronic kidney disease ([name]) ?CKd stage 3a. Hypokalemia  ?Renal function with serum cr at 1,67 with K at 3,9 and serum bicarbonate at 25. ? ?Plan to continue blood pressure support. ?Currently off diuretic therapy. ?Follow up renal function in am, avoid hypotension and nephrotoxic medications.  ? ?Right ankle  pain ?Reactive leukocytosis.  ?Pain did not respond to prednisone.  ?Plan for outpatient MRI follow up with orthopedics.  ? ?Dyslipidemia ?Continue with statin therapy.  ? ? ? ? ?  ? ?Subjective: Patient is feeling better, dyspnea and edema are improving, no chest pain,  ? ?Physical Exam: ?Vitals:  ? 01/27/22 1220 01/27/22 1530 01/27/22 1612 01/27/22 1936  ?BP: 126/85  109/88 111/89  ?Pulse: (!) 101  98 94  ?Resp: 18  18 20   ?Temp: 98 ?F (36.7 ?C)  (!) 97.5 ?F (36.4 ?C) 97.8 ?F (36.6 ?C)  ?TempSrc: Oral  Oral Oral  ?SpO2: 93% 95% 95% 93%  ?Weight:      ?Height:      ? ?Neurology awake and alert ?ENT with no pallor ?Cardiovascular with S1 and S2 present and rhythmic with no gallops, rubs or murmurs ?Respiratory with no rales or rhonchi ?Abdomen soft and non tender ? ?Data Reviewed: ? ? ? ?Family Communication: no family at the bedside  ? ?Disposition: ?Status is: Inpatient ?Remains inpatient appropriate because: heart failure and renal failure  ? Planned Discharge Destination: Home ? ? ?Author: ?Tawni Millers, MD ?01/27/2022 8:52 PM ? ?For on call review [email or web address].  ?

## 2022-01-27 NOTE — Progress Notes (Addendum)
? ? Advanced Heart Failure Rounding Note ? ?PCP-Cardiologist: None  ? ?Subjective:   ? ?4/24 Cardiogenic shock. Started on DBA 3 mcg.  Also started on midodrine. Diuresed 20+ lb with IV lasix.  ? ?Diuretics stopped. Given 500 cc fluid back 05/02. ? ?CO-OX 44%. Recheck. ? ?CVP 6. ? ?Scr trending back down, 1.35>1.5>1.63>1.77>1.67 ? ?Reports he was more dyspneic this am. O2 sats down to 90% on RA. Feels better on 2L O2 [place]. ? ? ? ?Objective:   ?Weight Range: ?69.3 kg ?Body mass index is 23.93 kg/m?.  ? ?Vital Signs:   ?Temp:  [97.5 ?F (36.4 ?C)-98.6 ?F (37 ?C)] 98.1 ?F (36.7 ?C) (05/03 0335) ?Pulse Rate:  [82-104] 102 (05/03 0735) ?Resp:  [17-20] 17 (05/03 0335) ?BP: (103-119)/(74-94) 119/89 (05/03 0735) ?SpO2:  [93 %-99 %] 93 % (05/03 0735) ?Last BM Date : 01/25/22 ? ?Weight change: ?Filed Weights  ? 01/24/22 0433 01/25/22 0300 01/26/22 0335  ?Weight: 72.8 kg 70.1 kg 69.3 kg  ? ? ?Intake/Output:  ?  ? ?Physical Exam  ?CVP 6 ?General:  No distress. ?HEENT: normal ?Neck: supple. no JVD. Carotids 2+ bilat; no bruits.  ?Cor: PMI nondisplaced. Regular rate & rhythm, tachy. No rubs, gallops or murmurs. ?Lungs: clear, comfortable on 2L O2 [place] ?Abdomen: soft, nontender, nondistended.  ?Extremities: no cyanosis, clubbing, rash, edema, + RUE PICC ?Neuro: alert & orientedx3, cranial nerves grossly intact. moves all 4 extremities w/o difficulty. Affect pleasant ? ? ? ?Telemetry  ? ?SR/ST, 90s-100s ? ?Labs  ?  ?CBC ?Recent Labs  ?  01/26/22 ?0500  ?WBC 14.0*  ?HGB 13.9  ?HCT 43.3  ?MCV 87.1  ?PLT 410*  ? ?Basic Metabolic Panel ?Recent Labs  ?  01/26/22 ?0500 01/27/22 ?0737  ?NA 141 142  ?K 4.1 3.9  ?CL 106 107  ?CO2 26 25  ?GLUCOSE 122* 133*  ?BUN 37* 36*  ?CREATININE 1.77* 1.67*  ?CALCIUM 9.5 9.6  ?MG 2.4 2.3  ? ?Liver Function Tests ?No results for input(s): AST, ALT, ALKPHOS, BILITOT, PROT, ALBUMIN in the last 72 hours. ? ?No results for input(s): LIPASE, AMYLASE in the last 72 hours. ?Cardiac Enzymes ?No results for input(s):  CKTOTAL, CKMB, CKMBINDEX, TROPONINI in the last 72 hours. ? ?BNP: ?BNP (last 3 results) ?Recent Labs  ?  11/19/21 ?1423 12/21/21 ?1255 01/17/22 ?1400  ?BNP 588.9* 887.4* 2,334.9*  ? ? ?ProBNP (last 3 results) ?No results for input(s): PROBNP in the last 8760 hours. ? ? ?D-Dimer ?No results for input(s): DDIMER in the last 72 hours. ?Hemoglobin A1C ?No results for input(s): HGBA1C in the last 72 hours. ? ?Fasting Lipid Panel ?No results for input(s): CHOL, HDL, LDLCALC, TRIG, CHOLHDL, LDLDIRECT in the last 72 hours. ?Thyroid Function Tests ?No results for input(s): TSH, T4TOTAL, T3FREE, THYROIDAB in the last 72 hours. ? ?Invalid input(s): FREET3 ? ?Other results: ? ? ?Imaging  ? ? ?No results found. ? ? ?Medications:   ? ? ?Scheduled Medications: ? amiodarone  200 mg Oral BID  ? aspirin  81 mg Oral Daily  ? atorvastatin  40 mg Oral Daily  ? Chlorhexidine Gluconate Cloth  6 each Topical Daily  ? dapagliflozin propanediol  10 mg Oral Daily  ? digoxin  0.0625 mg Oral Daily  ? heparin  5,000 Units Subcutaneous Q8H  ? midodrine  10 mg Oral TID WC  ? sodium chloride flush  10-40 mL Intracatheter Q12H  ? ? ?Infusions: ? ? ? ?PRN Medications: ?acetaminophen, sodium chloride, sodium chloride flush ? ? ? ?  Patient Profile  ? ?  ?Ms Warne is a 61 year old with history of HF due to mixed ischemic/no-ischemic CM (diagnosed 9/10) with EF 25-30% range. Cath with 1-v CAD with chronically occluded RCA. MRI in January 2011 with EF 31% with inferior scar.  ?  ?Admitted with A/C HFrEF-->shock.  ? ?Assessment/Plan  ? ?1. A/C HFrEF --> Shock suspect cardiogenic. ?Cath with 1-v CAD with chronically occluded RCA. MRI in January 2011 with EF 31% with inferior scar.  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  EF 45-50% on echo in 8/13.  ?- No f/u between to 2015-2021 ?- Echo 6/21 EF 20-25% severe central MR. Repeat cath 6/21 with stable CAD ?- Echo 07/07/20 EF 30-35% ?- Echo 01/05/21 EF 40-45% mild to mod MR  RV mild HK  ?- Echo this admit 4/23 EF 30-35%.   ?- He had been off all meds for ~ 1 month.  ?- Started on DBA and lasix gtt w/ 20 lb diuresis.  ?- DBA off 04/27. CO-OX 44%. Recheck.  ?- CVP 6. Off diuretics. More dyspneic today. On 2L O2 [place]. Check CXR. ? Pulmonary edema. Weight continues to trend down. ?- Renal function peaked and now trending down, 1.35 -> 1.53 -> 1.63->1.8 (IV fluids) -> 1.67 ?- Continue Farxiga 10 mg daily  ?- Continue digoxin 0.125 ?- GDMT limited by low BP. Continue midodrine for BP support  ? ?2. AKI on CKD Stage IIIa ?- Due to cardiorenal syndrome ?- Creatinine baseline 1.4 ->admit 1.7->1.9 -> 1.35 -> 1.53->1.8 -> 1.7 ?-Renal US- no hydronephrosis.  ?  ?3. PVCs ?- Continue amio 200 mg twice a day.  ?- Well suppressed ? ?4. CAD  ? -Cath with 1-v CAD with chronically occluded RCA ?- HS Trop 15  ?- No s/s angina ?- Continue aspirin and atorvastatin.  ?  ?5. Hypokalemia  ? - K 3.9 ?- Supp as needed ? ?6. Right ankle pain ?- Uric acid ok ?- No evidence septic arthritis ?- Some relief with prednisone x 1  ?- Xray negative except for soft tissue swelling.  ?- TRH managing. ESR and CRP okay. ?- CT with possible partial tear peroneus brevis tendon. Going for MRI today ? ?SDOH: ?Referred to Paramedicine.  ?Medicaid application started.  ?No insurance will need meds prior to d/c. HF Fund/Match.  ? ?Length of Stay: 10 ? ?FINCH, LINDSAY N, PA-C  ?01/27/2022, 7:57 AM ? ?Advanced Heart Failure Team ?Pager [phone] (M-F; 7a - 5p)  ?Please contact [facility] for night-coverage after hours (5p -7a ) and weekends on [email or web address] ? ?Patient seen and examined with the above-signed Advanced Practice Provider and/or Housestaff. I personally reviewed laboratory data, imaging studies and relevant notes. I independently examined the patient and formulated the important aspects of the plan. I have edited the note to reflect any of my changes or salient points. I have personally discussed the plan with the patient  and/or family. ? ?Was given 30cc IVF yesterday for low volume and AKI. CVP 6. Scr improving but co-ox low ? ?General:  Well appearing. No resp difficulty ?HEENT: normal ?Neck: supple. no JVD. Carotids 2+ bilat; no bruits. No lymphadenopathy or thryomegaly appreciated. ?Cor: PMI nondisplaced. Regular tachy No rubs, gallops or murmurs. ?Lungs: clear ?Abdomen: soft, nontender, nondistended. No hepatosplenomegaly. No bruits or masses. Good bowel sounds. ?Extremities: no cyanosis, clubbing, rash, edema ?Neuro: alert & orientedx3, cranial nerves grossly intact. moves all 4 extremities w/o difficulty. Affect pleasant ? ?Will hold IV lasix. Recheck co-ox. Continue  midodrine support. If co-ox > 55% will be ready for d/c.  ? ?Glori Bickers, MD  ?9:39 AM ? ? ?

## 2022-01-27 NOTE — Progress Notes (Signed)
SATURATION QUALIFICATIONS: (This note is used to comply with regulatory documentation for home oxygen)  Patient Saturations on Room Air at Rest = 97  Patient Saturations on Room Air while Ambulating = 95%  Patient Saturations on 0 Liters of oxygen while Ambulating = n/a%  Please briefly explain why patient needs home oxygen: 

## 2022-01-28 LAB — BASIC METABOLIC PANEL
Anion gap: 13 (ref 5–15)
BUN: 34 mg/dL — ABNORMAL HIGH (ref 6–20)
CO2: 24 mmol/L (ref 22–32)
Calcium: 9.3 mg/dL (ref 8.9–10.3)
Chloride: 102 mmol/L (ref 98–111)
Creatinine, Ser: 1.63 mg/dL — ABNORMAL HIGH (ref 0.61–1.24)
GFR, Estimated: 48 mL/min — ABNORMAL LOW (ref >60)
Glucose, Bld: 143 mg/dL — ABNORMAL HIGH (ref 70–99)
Potassium: 3.8 mmol/L (ref 3.5–5.1)
Sodium: 139 mmol/L (ref 135–145)

## 2022-01-28 LAB — COOXEMETRY PANEL
Carboxyhemoglobin: 0.6 % (ref 0.5–1.5)
Carboxyhemoglobin: 1.5 % (ref 0.5–1.5)
Methemoglobin: 0.8 % (ref 0.0–1.5)
Methemoglobin: 0.7 % (ref 0.0–1.5)
O2 Saturation: 46 %
O2 Saturation: 56.9 %
Total hemoglobin: 13.8 g/dL (ref 12.0–16.0)
Total hemoglobin: 13.6 g/dL (ref 12.0–16.0)

## 2022-01-28 LAB — MAGNESIUM: Magnesium: 2.3 mg/dL (ref 1.7–2.4)

## 2022-01-28 MED ORDER — FUROSEMIDE 10 MG/ML IJ SOLN
80.0000 mg | Freq: Two times a day (BID) | INTRAMUSCULAR | Status: DC
  Administered 2022-01-28 – 2022-01-30 (×5): 80 mg via INTRAVENOUS
  Filled 2022-01-28 (×5): qty 8

## 2022-01-28 MED ORDER — POTASSIUM CHLORIDE CRYS ER 20 MEQ PO TBCR
40.0000 meq | EXTENDED_RELEASE_TABLET | Freq: Once | ORAL | Status: AC
  Administered 2022-01-28: 40 meq via ORAL
  Filled 2022-01-28: qty 2

## 2022-01-28 NOTE — Plan of Care (Signed)

## 2022-01-28 NOTE — Progress Notes (Signed)
?  Mobility Specialist Criteria Algorithm Info. ? ? 01/28/22 1035  ?Oxygen Therapy  ?SpO2 94 %  ?O2 Device Room Air  ?Patient Activity (if Appropriate) Ambulating  ?Pulse Oximetry Type Continuous  ?Mobility  ?Activity Ambulated with assistance in hallway  ?Range of Motion/Exercises Active;All extremities  ?Level of Assistance Standby assist, set-up cues, supervision of patient - no hands on  ?Assistive Device Other (Comment) ?(IV pole)  ?Distance Ambulated (ft) 500 ft  ?Activity Response Tolerated well  ? ?Patient received in supine agreeable to participate. Still very anxious about dyspnea w/ exertion, reassured pt SpO2 maintains >90% when practicing pursed lip breathing. Pt receptive of CAM boot education and was able donn/doff with supervision. Ambulated in hallway with steady but fast gait. C/o SOB upon returning to room. SpO2 >94% throughout ambulation. Was left dangling EOB with all needs met, call bell in reach. ? ?01/28/2022 ?11:18 AM ? ?[place] Siah Kannan, CMS, BS EXP ?Acute Rehabilitation Services  ?FGILU:[phone] ?Office: [phone] ? ?

## 2022-01-28 NOTE — Progress Notes (Addendum)
? ? Advanced Heart Failure Rounding Note ? ?PCP-Cardiologist: None  ? ?Subjective:   ? ?4/24 Cardiogenic shock. Started on DBA 3 mcg.  Also started on midodrine. Diuresed 20+ lb with IV lasix.  ?4/27 DBA off ?05/03 Recurrent low-output. CO-OX 45%. DBA 2.5 restarted. ? ?CO-OX 46% this am on DBA 2.5. Recheck 57% ? ?CVP 15 ? ?Scr trending back down, 1.35>1.5>1.63>1.77>1.67>1.63 ? ?Dyspnea improved on inotrope support. No orthopnea or PND. Reports decreased appetite. Weight up 4 lb overnight. ? ? ?Objective:   ?Weight Range: ?71.2 kg ?Body mass index is 24.58 kg/m?.  ? ?Vital Signs:   ?Temp:  [97.5 ?F (36.4 ?C)-98.4 ?F (36.9 ?C)] 98.3 ?F (36.8 ?C) (05/04 0719) ?Pulse Rate:  [87-102] 102 (05/04 0719) ?Resp:  [17-20] 18 (05/04 0719) ?BP: (108-126)/(70-89) 113/89 (05/04 0719) ?SpO2:  [93 %-100 %] 96 % (05/04 0719) ?Weight:  [71.2 kg] 71.2 kg (05/04 0318) ?Last BM Date : 01/25/22 ? ?Weight change: ?Filed Weights  ? 01/25/22 0300 01/26/22 0335 01/28/22 0318  ?Weight: 70.1 kg 69.3 kg 71.2 kg  ? ? ?Intake/Output:  ?  ? ?Physical Exam  ?CVP 15 ?General:  No distress. Sitting up in bed.  ?HEENT: normal ?Neck: supple. JVP 14. Carotids 2+ bilat; no bruits.  ?Cor: PMI nondisplaced. Regular rate & rhythm. No rubs, gallops or murmurs. ?Lungs: clear ?Abdomen: soft, nontender, nondistended. No hepatosplenomegaly.  ?Extremities: no cyanosis, clubbing, rash, TED hose on, no edema, RUE PICC looks okay ?Neuro: alert & orientedx3, cranial nerves grossly intact. moves all 4 extremities w/o difficulty. Affect pleasant ? ? ? ? ?Telemetry  ? ?SR 90s ? ?Labs  ?  ?CBC ?Recent Labs  ?  01/26/22 ?0500 01/27/22 ?6195  ?WBC 14.0* 13.8*  ?HGB 13.9 14.1  ?HCT 43.3 42.9  ?MCV 87.1 88.3  ?PLT 410* 424*  ? ?Basic Metabolic Panel ?Recent Labs  ?  01/27/22 ?0932 01/28/22 ?0340  ?NA 142 139  ?K 3.9 3.8  ?CL 107 102  ?CO2 25 24  ?GLUCOSE 133* 143*  ?BUN 36* 34*  ?CREATININE 1.67* 1.63*  ?CALCIUM 9.6 9.3  ?MG 2.3 2.3  ? ?Liver Function Tests ?No results for  input(s): AST, ALT, ALKPHOS, BILITOT, PROT, ALBUMIN in the last 72 hours. ? ?No results for input(s): LIPASE, AMYLASE in the last 72 hours. ?Cardiac Enzymes ?No results for input(s): CKTOTAL, CKMB, CKMBINDEX, TROPONINI in the last 72 hours. ? ?BNP: ?BNP (last 3 results) ?Recent Labs  ?  11/19/21 ?1423 12/21/21 ?1255 01/17/22 ?1400  ?BNP 588.9* 887.4* 2,334.9*  ? ? ?ProBNP (last 3 results) ?No results for input(s): PROBNP in the last 8760 hours. ? ? ?D-Dimer ?No results for input(s): DDIMER in the last 72 hours. ?Hemoglobin A1C ?No results for input(s): HGBA1C in the last 72 hours. ? ?Fasting Lipid Panel ?No results for input(s): CHOL, HDL, LDLCALC, TRIG, CHOLHDL, LDLDIRECT in the last 72 hours. ?Thyroid Function Tests ?No results for input(s): TSH, T4TOTAL, T3FREE, THYROIDAB in the last 72 hours. ? ?Invalid input(s): FREET3 ? ?Other results: ? ? ?Imaging  ? ? ?MR ANKLE RIGHT WO CONTRAST ? ?Result Date: 01/27/2022 ?CLINICAL DATA:  Ankle pain.  Tendon abnormality suspected. EXAM: MRI OF THE RIGHT ANKLE WITHOUT CONTRAST TECHNIQUE: Multiplanar, multisequence MR imaging of the ankle was performed. No intravenous contrast was administered. COMPARISON:  Right ankle radiographs 01/24/2022, CT right ankle 10/28/2021 FINDINGS: TENDONS Peroneal: There is a "chevron configuration" of the peroneus brevis tendon indicating a partial-thickness longitudinal split tear starting at the distal aspect of the fibula and involving  an approximate 4 cm length of the tendon (axial series 5, images 15 through 25). The peroneus longus tendon is intact. Posteromedial: Intact tibialis posterior, flexor digitorum longus, and flexor hallucis longus tendons. Anterior: Minimal extensor digitorum longus tenosynovitis at the level of the tibiotalar joint (axial series 5, image 15). The tibialis anterior and extensor hallucis longus tendons are intact. Achilles: Intact.  Minimal fluid within the pre-Achilles bursa. Plantar Fascia: Intact. LIGAMENTS  Lateral: The anterior and posterior talofibular, anterior and posterior tibiofibular, and calcaneofibular ligaments are intact. Medial: The tibiotalar deep deltoid and tibial spring ligaments are intact. CARTILAGE Ankle Joint: Intact cartilage. Subtalar Joints/Sinus Tarsi: Fat is preserved within sinus tarsi. Mild cartilage thinning and peripheral degenerative spurring of the posterior subtalar joint and dorsal talonavicular joint. Bones: No acute fracture. Other: Mild medial greater than lateral ankle subcutaneous fat and dorsal midfoot subcutaneous fat edema and swelling. IMPRESSION:: IMPRESSION: 1. Partial-thickness longitudinal split tear of the peroneus brevis tendon starting at the distal aspect of the fibula and involving an approximate 4 cm length of the tendon. 2. 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 Electronically Signed   By: Yvonne Kendall M.D.   On: 01/27/2022 12:55   ? ? ?Medications:   ? ? ?Scheduled Medications: ? amiodarone  200 mg Oral BID  ? aspirin  81 mg Oral Daily  ? atorvastatin  40 mg Oral Daily  ? Chlorhexidine Gluconate Cloth  6 each Topical Daily  ? dapagliflozin propanediol  10 mg Oral Daily  ? digoxin  0.0625 mg Oral Daily  ? heparin  5,000 Units Subcutaneous Q8H  ? sodium chloride flush  10-40 mL Intracatheter Q12H  ? ? ?Infusions: ? DOBUTamine 2.5 mcg/kg/min (01/27/22 2238)  ? ? ? ?PRN Medications: ?acetaminophen, sodium chloride, sodium chloride flush ? ? ? ?Patient Profile  ? ?  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 Cath with 1-v CAD with chronically occluded RCA. MRI in January 2011 with EF 31% with inferior scar.  ?  ?Admitted with A/C HFrEF-->shock.  ? ?Assessment/Plan  ? ?1. A/C HFrEF --> Shock suspect cardiogenic. ?Cath with 1-v CAD with chronically occluded RCA. MRI in January 2011 with EF 31% with inferior scar.  ?- Suspect mixed ischemic/nonischmic cardiomyopathy  with familial component.  EF 45-50% on echo in 8/13.  ?- No f/u between to 2015-2021 ?- Echo 6/21 EF 20-25% severe central MR. Repeat cath 6/21 with stable CAD ?- Echo 07/07/20 EF 30-35% ?- Echo 01/05/21 EF 40-45% mild to mod MR RV mild HK  ?- Echo this admit 4/23 EF 30-35%.   ?- He had been off all meds for ~ 1 month.  ?- Started on DBA and lasix gtt w/ 20 lb diuresis.  ?- DBA off 04/27. Recurrent low-output and DBA restarted 05/03. ?-CO-OX 46% >>>recheck marginal 57% this am on 2.5 DBA.  ?- CVP 15. Restart IV lasix 80 BID. ?- Renal function peaked and now trending down, 1.35 -> 1.53 -> 1.63->1.8 (IV fluids) -> 1.67 ?- Continue Farxiga 10 mg daily  ?- Continue digoxin 0.125, dig level okay 05/02 ?- Worry that he may be end-stage. Failed initial DBA wean. May need to consider advanced therapies but compliance has been an issue. To complicate things, he has little social support. No friends or family in the area. Has a brother in [place], but most his other family lives in [place] and [place]. No insurance, medicaid pending. ? ?2.  AKI on CKD Stage IIIa ?- Due to cardiorenal syndrome ?- Creatinine baseline 1.4 ->admit 1.7->1.9 -> 1.35 -> 1.53->1.8 -> 1.7 -> 1.6 ?-Renal US- no hydronephrosis.  ?  ?3. PVCs ?- Continue amio 200 mg twice a day.  ?- Well suppressed ? ?4. CAD  ? -Cath with 1-v CAD with chronically occluded RCA ?- HS Trop 15  ?- No s/s angina ?- Continue aspirin and atorvastatin.  ?  ?5. Hypokalemia  ? - K 3.8 ?- Supp with diuresis ? ?6. Right ankle pain ?- Uric acid ok ?- No evidence septic arthritis ?- Some relief with prednisone x 1  ?- Xray negative except for soft tissue swelling.  ?- TRH managing. ESR and CRP okay. ?- MRI with partial thickness split tear of peroneus brevis tendon ? ?SDOH: ?Referred to Paramedicine.  ?Medicaid application started.  ?No insurance will need meds prior to d/c. HF Fund/Match.  ? ? ?Continue inotrope support and diurese with IV lasix. Difficult situation. Failed initial inotrope wean.  May need RHC to better assess hemodynamics. ? ?Has no insurance and poor social support. ? ?Length of Stay: 11 ? ?FINCH, LINDSAY N, PA-C  ?01/28/2022, 8:47 AM ? ?Advanced Heart Failure Team ?Pager [phone] (M

## 2022-01-28 NOTE — Progress Notes (Signed)
?Progress Note ? ? ?Patient: Zachary Arias [id number] DOB: May 30, 1961 DOA: 01/17/2022     11 ?DOS: the patient was seen and examined on 01/28/2022 ?  ?Brief hospital course: ?[name] was admitted to the hospital with the working diagnosis of decompensated heart failure.  ? ?Zachary Arias is a 61 y.o. male with a history of combined CHF, ischemic cardiomyopathy, CAD, hypertension, CKD stage IIIa, frequent PVCs on amiodarone. Patient presented secondary to shortness of breath and LE swelling.  Over the last 2 weeks prior to hospitalization he developed worsening dyspnea and lower extremity edema, refractive to increasing dose and frequency of furosemide. Positive weight gain, more than 8 lbs over last 8 weeks. On his initial physical examination his blood pressure was 125/91, HR 106, RR 16 and 02 saturation 97% on room air. Lungs with bilateral rales at bases, heart with S1 and S2 present and rhythmic, abdomen not distended, positive lower extremity edema ++.  ? ?Na 143, K 4,3 CL 110, bicarbonate 21 glucose 128, bun 19 cr 1,69 ?BNP 2,334  ?Lactic acid 1,9 and 2,7  ?Wbc 18,8, hgb 14,8 plt 262  ? ?Chest radiograph with cardiomegaly, bilateral hilar vascular congestion, positive fluid in the right fissures.  ? ?EKG 102 bpm, normal axis, normal intervals, sinus rhythm with left atrial enlargement, poor R wave progression, no significant ST segment or T wave changes.  ? ?Patient was placed on furosemide for diuresis, developed low output failure (cardiogenic shock), requires inotropic support.  ? ?While admitted, patient expressed concern for right ankle pain. Found to have probably tendon tear. ? ?Clinically improved but not yet back to baseline. ?Not able to wean off inotropic support.  ? ?Assessment and Plan: ?Congestive heart failure ([place]) ?Acute on chronic systolic heart failure.  ?Pulmonary hypertension acute on chronic core pulmonale.  ? ?Echocardiogram with reduced LV systolic function with EF 30 to 35%. Severe  dilatation of internal cavity. Mild enlargement of RV. Severe elevation in pulmonary artery systolic pressure, RVSP 64 mmHg. Severe dilatation of left atrium. Posterior trivial pericardial effusion. Moderate TR.  ? ?Urine output over last 24 hrs is 1,200 ml. ?Systolic blood pressure 123456 to 118 mmHg range.  ?SV02 56.9  ? ?Patient placed back on dobutamine today.  ?Continue with dogoxin, dapagliflozin and blood pressure support with midodrine. ? ?PVC, ectopy continue with amiodarone with good toleration.   ? ?Acute kidney injury superimposed on chronic kidney disease ([place]) ?CKd stage 3a. Hypokalemia  ?Stable renal function today with serum cr at 1,63, K is 3,8 and serum bicarbonate is 24. Mg 2,3  ? ?Resume diuresis with furosemide 80 mg IV q12 hrs ?Follow up renal function in am, avoid hypotension or nephrotoxic medications.  ?Add 40 Kcl po.  ? ?Right ankle pain ?Reactive leukocytosis.  ?Pain did not respond to prednisone.  ?Plan for outpatient MRI follow up with orthopedics.  ? ?Dyslipidemia ?Continue with statin therapy.  ? ? ? ? ?  ? ?Subjective: Patient is feeling better, no nausea or vomiting, no chest pain, dyspnea and edema continue to improve.  ? ?Physical Exam: ?Vitals:  ? 01/28/22 0318 01/28/22 0719 01/28/22 1035 01/28/22 1101  ?BP:  113/89  118/89  ?Pulse:  (!) 102  (!) 103  ?Resp:  18  18  ?Temp:  98.3 ?F (36.8 ?C)  98.4 ?F (36.9 ?C)  ?TempSrc:  Oral  Oral  ?SpO2:  96% 94% 97%  ?Weight: 71.2 kg     ?Height:      ? ?Neurology awake and alert ?  ENT with mild pallor ?Cardiovascular with S1 and S2 present and rhythmic with no gallops, rubs or murmurs ?No JVD ?Mild lower extremity edema ?Respiratory with scattered rales ?Abdomen not distended  ?Data Reviewed: ? ? ? ?Family Communication: no family at the bedside  ? ?Disposition: ?Status is: Inpatient ?Remains inpatient appropriate because: heart failure  ? Planned Discharge Destination: Home ? ?Author: ?Tawni Millers, MD ?01/28/2022 1:44 PM ? ?For on call  review [email or web address].  ?

## 2022-01-29 LAB — COOXEMETRY PANEL
Carboxyhemoglobin: 0.3 % — ABNORMAL LOW (ref 0.5–1.5)
Carboxyhemoglobin: 0.8 % (ref 0.5–1.5)
Methemoglobin: <0.7 % (ref 0.0–1.5)
Methemoglobin: <0.7 % (ref 0.0–1.5)
O2 Saturation: 40.3 %
O2 Saturation: 48.8 %
Total hemoglobin: 13.4 g/dL (ref 12.0–16.0)
Total hemoglobin: 13.8 g/dL (ref 12.0–16.0)

## 2022-01-29 LAB — MAGNESIUM: Magnesium: 2.4 mg/dL (ref 1.7–2.4)

## 2022-01-29 LAB — BASIC METABOLIC PANEL
Anion gap: 12 (ref 5–15)
BUN: 39 mg/dL — ABNORMAL HIGH (ref 6–20)
CO2: 25 mmol/L (ref 22–32)
Calcium: 9.1 mg/dL (ref 8.9–10.3)
Chloride: 101 mmol/L (ref 98–111)
Creatinine, Ser: 1.86 mg/dL — ABNORMAL HIGH (ref 0.61–1.24)
GFR, Estimated: 41 mL/min — ABNORMAL LOW (ref >60)
Glucose, Bld: 166 mg/dL — ABNORMAL HIGH (ref 70–99)
Potassium: 3.5 mmol/L (ref 3.5–5.1)
Sodium: 138 mmol/L (ref 135–145)

## 2022-01-29 MED ORDER — POTASSIUM CHLORIDE CRYS ER 20 MEQ PO TBCR
40.0000 meq | EXTENDED_RELEASE_TABLET | Freq: Once | ORAL | Status: AC
  Administered 2022-01-29: 40 meq via ORAL
  Filled 2022-01-29: qty 2

## 2022-01-29 MED ORDER — MELATONIN 5 MG PO TABS
5.0000 mg | ORAL_TABLET | Freq: Every evening | ORAL | Status: DC | PRN
  Administered 2022-01-29: 5 mg via ORAL
  Filled 2022-01-29: qty 1

## 2022-01-29 MED ORDER — METOLAZONE 2.5 MG PO TABS
2.5000 mg | ORAL_TABLET | Freq: Once | ORAL | Status: AC
  Administered 2022-01-29: 2.5 mg via ORAL
  Filled 2022-01-29: qty 1

## 2022-01-29 NOTE — TOC CM/SW Note (Signed)
HF TOC CM referral sent to [facility] for Home IV Dobutamine, received call back and due to no insurance coverage unable to arrange charity for medication. Per [facility], Dobutamine is not covered by Medicaid. Pt's Medicaid and disability application were completed. Jonnie Finner RN3 CCM, Heart Failure TOC CM [phone]  ?

## 2022-01-29 NOTE — Progress Notes (Signed)
?Progress Note ? ? ?Patient: Zachary Arias [id number] DOB: 1961-01-01 DOA: 01/17/2022     12 ?DOS: the patient was seen and examined on 01/29/2022 ?  ?Brief hospital course: ?Zachary Arias was admitted to the hospital with the working diagnosis of decompensated heart failure.  ? ?Zachary Arias is a 61 y.o. male with a history of combined CHF, ischemic cardiomyopathy, CAD, hypertension, CKD stage IIIa, frequent PVCs on amiodarone. Patient presented secondary to shortness of breath and LE swelling.  Over the last 2 weeks prior to hospitalization he developed worsening dyspnea and lower extremity edema, refractive to increasing dose and frequency of furosemide. Positive weight gain, more than 8 lbs over last 8 weeks. On his initial physical examination his blood pressure was 125/91, HR 106, RR 16 and 02 saturation 97% on room air. Lungs with bilateral rales at bases, heart with S1 and S2 present and rhythmic, abdomen not distended, positive lower extremity edema ++.  ? ?Na 143, K 4,3 CL 110, bicarbonate 21 glucose 128, bun 19 cr 1,69 ?BNP 2,334  ?Lactic acid 1,9 and 2,7  ?Wbc 18,8, hgb 14,8 plt 262  ? ?Chest radiograph with cardiomegaly, bilateral hilar vascular congestion, positive fluid in the right fissures.  ? ?EKG 102 bpm, normal axis, normal intervals, sinus rhythm with left atrial enlargement, poor R wave progression, no significant ST segment or T wave changes.  ? ?Patient was placed on furosemide for diuresis, developed low output failure (cardiogenic shock), requires inotropic support.  ? ?While admitted, patient expressed concern for right ankle pain. Found to have probably tendon tear. ? ?Clinically improved but not yet back to baseline. ?Not able to wean off inotropic support.  ? ?Assessment and Plan: ?Congestive heart failure ([place]) ?Acute on chronic systolic heart failure.  ?Pulmonary hypertension acute on chronic core pulmonale.  ? ?Echocardiogram with reduced LV systolic function with EF 30 to 35%. Severe  dilatation of internal cavity. Mild enlargement of RV. Severe elevation in pulmonary artery systolic pressure, RVSP 64 mmHg. Severe dilatation of left atrium. Posterior trivial pericardial effusion. Moderate TR.  ? ?Urine output over last 24 hrs is 3,500 ml. ?Systolic blood pressure 0000000 to 148mmHg range.  ?SV02 56.9  ? ?Plan to continue with dobutamine infusion 2.5 mcg/kg ?On dogoxin and dapagliflozin. ?Diuresis with furosemide 80 mg bid IV.  ? ?PVC, ectopy continue with amiodarone with good toleration.   ? ?Acute kidney injury superimposed on chronic kidney disease ([place]) ?CKd stage 3a. Hypokalemia  ? ?Improved urine output.  ?Renal function with serum cr t 1,86 with K at 3,5 and serum bicarbonate at 25. Mg 2,4  ? ?Plan to continue with furosemide 80 mg IV q12 hrs ?Continue K correction with KCL, target K of 4 and Mg 2.  ? ?Right ankle pain ?Reactive leukocytosis.  ?Pain did not respond to prednisone.  ?Plan for outpatient MRI follow up with orthopedics.  ? ?Dyslipidemia ?Continue with statin therapy.  ? ? ? ? ?  ? ?Subjective: Patient is feeling better, not able to sleep last night, right ankle pain is controlled.  ? ?Physical Exam: ?Vitals:  ? 01/28/22 2005 01/28/22 2312 01/29/22 0332 01/29/22 0733  ?BP: 103/69 108/86 114/87 109/82  ?Pulse: 88 97 93 98  ?Resp: 20 20 20  (!) 22  ?Temp: 98.2 ?F (36.8 ?C) 98.2 ?F (36.8 ?C) 98.2 ?F (36.8 ?C) 98.7 ?F (37.1 ?C)  ?TempSrc: Oral Oral Oral Oral  ?SpO2: 99% 97% 97% 98%  ?Weight:   70.1 kg   ?Height:      ? ?  Neurology awake and alert ?ENT with no pallor ?Cardiovascular with S1 and S2 present with positive systolic murmur 3/6 at the right sternal border. ?Positive JVD ?No lower extremity edema.  ?Respiratory with bilateral rales with no wheezing ?Abdomen with no distention  ? ?Data Reviewed: ? ? ? ?Family Communication: no family at the bedside  ? ?Disposition: ?Status is: Inpatient ?Remains inpatient appropriate because: heart failure on IV drips.  ? Planned Discharge  Destination: Home ? ? ? ? ?Author: ?Tawni Millers, MD ?01/29/2022 9:33 AM ? ?For on call review [email or web address].  ?

## 2022-01-29 NOTE — Progress Notes (Signed)
Mobility Specialist Progress Note: ? ? 01/29/22 1520  ?Mobility  ?Activity Ambulated with assistance in hallway  ?Level of Assistance Modified independent, requires aide device or extra time  ?Assistive Device Other (Comment) ?(IV Pole)  ?Distance Ambulated (ft) 500 ft  ?Activity Response Tolerated well  ?$Mobility charge 1 Mobility  ? ? ?Pre Mobility: SpO2 100% 2LO2 ?During Mobility: SpO2 90% RA ?Post Mobility: SpO2 95% RA ? ?Pt received agreeable to mobility session. No physical assistance required. VSS on RA, pt back in bed with all needs met. Left on RA, RN notified.  ? ?Nelta Numbers ?Acute Rehab ?Phone: [phone] ?Office Phone: [phone] ? ?

## 2022-01-29 NOTE — Progress Notes (Addendum)
? ? Advanced Heart Failure Rounding Note ? ?PCP-Cardiologist: None  ? ?Subjective:   ? ?4/24 Cardiogenic shock. Started on DBA 3 mcg.  Also started on midodrine. Diuresed 20+ lb with IV lasix.  ?4/27 DBA off ?05/03 Recurrent low-output. CO-OX 45%. DBA 2.5 restarted. ? ?CO-OX 40%>>49%  ? ?CVP 18-19 ? ?Scr 1.63>1.77>1.67>1.63>1.86 ? ?3.5L UOP yesterday with IV lasix. Weight down 2 lb.  ? ?Trouble sleeping last night. No dyspnea this am but reports intermittent SOB overnight.  ? ?Decreased appetite, food doesn't taste good. ? ? ?Objective:   ?Weight Range: ?70.1 kg ?Body mass index is 24.2 kg/m?.  ? ?Vital Signs:   ?Temp:  [98.2 ?F (36.8 ?C)-98.7 ?F (37.1 ?C)] 98.7 ?F (37.1 ?C) (05/05 5397) ?Pulse Rate:  [88-103] 89 (05/05 0935) ?Resp:  [18-22] 22 (05/05 0733) ?BP: (103-118)/(69-89) 109/82 (05/05 6734) ?SpO2:  [94 %-99 %] 98 % (05/05 0733) ?Weight:  [70.1 kg] 70.1 kg (05/05 0332) ?Last BM Date : 01/25/22 ? ?Weight change: ?Filed Weights  ? 01/26/22 0335 01/28/22 0318 01/29/22 0332  ?Weight: 69.3 kg 71.2 kg 70.1 kg  ? ? ?Intake/Output:  ?  ? ?Physical Exam  ?CVP 18-19 ?General:  No distress. Sitting on side of bed. ?HEENT: normal ?Neck: JVP elevated. Carotids 2+ bilat; no bruits.  ?Cor: PMI nondisplaced. Regular rate & rhythm, tachy. No rubs, gallops or murmurs. ?Lungs: clear ?Abdomen: soft, nontender, nondistended. No hepatosplenomegaly.  ?Extremities: no cyanosis, clubbing, rash, edema, TED hose on, + RUE PICC ?Neuro: alert & orientedx3, cranial nerves grossly intact. moves all 4 extremities w/o difficulty. Affect pleasant ? ? ?Telemetry  ? ?SR 90s-100s ? ?Labs  ?  ?CBC ?Recent Labs  ?  01/27/22 ?1937  ?WBC 13.8*  ?HGB 14.1  ?HCT 42.9  ?MCV 88.3  ?PLT 424*  ? ?Basic Metabolic Panel ?Recent Labs  ?  01/28/22 ?9024 01/29/22 ?0350  ?NA 139 138  ?K 3.8 3.5  ?CL 102 101  ?CO2 24 25  ?GLUCOSE 143* 166*  ?BUN 34* 39*  ?CREATININE 1.63* 1.86*  ?CALCIUM 9.3 9.1  ?MG 2.3 2.4  ? ?Liver Function Tests ?No results for input(s):  AST, ALT, ALKPHOS, BILITOT, PROT, ALBUMIN in the last 72 hours. ? ?No results for input(s): LIPASE, AMYLASE in the last 72 hours. ?Cardiac Enzymes ?No results for input(s): CKTOTAL, CKMB, CKMBINDEX, TROPONINI in the last 72 hours. ? ?BNP: ?BNP (last 3 results) ?Recent Labs  ?  11/19/21 ?1423 12/21/21 ?1255 01/17/22 ?1400  ?BNP 588.9* 887.4* 2,334.9*  ? ? ?ProBNP (last 3 results) ?No results for input(s): PROBNP in the last 8760 hours. ? ? ?D-Dimer ?No results for input(s): DDIMER in the last 72 hours. ?Hemoglobin A1C ?No results for input(s): HGBA1C in the last 72 hours. ? ?Fasting Lipid Panel ?No results for input(s): CHOL, HDL, LDLCALC, TRIG, CHOLHDL, LDLDIRECT in the last 72 hours. ?Thyroid Function Tests ?No results for input(s): TSH, T4TOTAL, T3FREE, THYROIDAB in the last 72 hours. ? ?Invalid input(s): FREET3 ? ?Other results: ? ? ?Imaging  ? ? ?No results found. ? ? ?Medications:   ? ? ?Scheduled Medications: ? amiodarone  200 mg Oral BID  ? aspirin  81 mg Oral Daily  ? atorvastatin  40 mg Oral Daily  ? Chlorhexidine Gluconate Cloth  6 each Topical Daily  ? dapagliflozin propanediol  10 mg Oral Daily  ? digoxin  0.0625 mg Oral Daily  ? furosemide  80 mg Intravenous BID  ? heparin  5,000 Units Subcutaneous Q8H  ? potassium chloride  40  mEq Oral Once  ? sodium chloride flush  10-40 mL Intracatheter Q12H  ? ? ?Infusions: ? DOBUTamine 2.5 mcg/kg/min (01/29/22 0800)  ? ? ? ?PRN Medications: ?acetaminophen, melatonin, sodium chloride, sodium chloride flush ? ? ? ?Patient Profile  ? ?  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 Cath with 1-v CAD with chronically occluded RCA. MRI in January 2011 with EF 31% with inferior scar.  ?  ?Admitted with A/C HFrEF-->shock.  ? ?Assessment/Plan  ? ?1. A/C HFrEF --> Shock suspect cardiogenic. ?Cath with 1-v CAD with chronically occluded RCA. MRI in January 2011 with EF 31% with inferior scar.  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  EF 45-50% on echo in 8/13.  ?- No f/u between to 2015-2021 ?- Echo 6/21 EF 20-25% severe central MR. Repeat cath 6/21 with stable CAD ?- Echo 07/07/20 EF 30-35% ?- Echo 01/05/21 EF 40-45% mild to mod MR RV mild HK  ?- Echo this admit 4/23 EF 30-35%.   ?- He had been off all meds for ~ 1 month.  ?- Started on DBA and lasix gtt w/ 20 lb diuresis.  ?- DBA off 04/27. Recurrent low-output and DBA restarted 05/03. ?-CO-OX 440%>>49% on 2.5 DBA, increase to 5 mcg/kg/min ?- CVP 18. 3.5L UOP with IV lasix yesterday. Add metolazone 2.5. ?- Worry Scr starting to trend back up, 1.6>1.77>1.7>1.6>1.87 ?- Continue Farxiga 10 mg daily  ?- Continue digoxin 0.125, dig level okay 05/02 ?- Worry that he may be end-stage. Failed initial DBA wean. CO-OX low again today. Not a good candidate for advanced therapies with lack of support system and does not want this. He would consider trial of home inotropes.  ?- Currently has no insurance. Medicaid pending. Discussed with [employer] home infusion rep, Carolynn Sayers. Would need Medicare or LOG from [facility] in order to qualify for home inotrope.  Will review with HF TOC CM. ? ?2. AKI on CKD Stage IIIa ?- Due to cardiorenal syndrome ?- Creatinine baseline 1.4 ->admit 1.7->1.9 -> 1.35 -> 1.53->1.8 -> 1.7 -> 1.6 -> 1.86 ?-Renal US- no hydronephrosis.  ?  ?3. PVCs ?- Continue amio 200 mg twice a day.  ?- Well suppressed ? ?4. CAD  ? -Cath with 1-v CAD with chronically occluded RCA ?- HS Trop 15  ?- No s/s angina ?- Continue aspirin and atorvastatin.  ?  ?5. Hypokalemia  ? - K 3.5 ?- Supp with diuresis ? ?6. Right ankle pain ?- Uric acid ok ?- No evidence septic arthritis ?- Some relief with prednisone x 1  ?- Xray negative except for soft tissue swelling.  ?- TRH managing. ESR and CRP okay. ?- MRI with partial thickness split tear of peroneus brevis tendon ? ?SDOH: ?Referred to Paramedicine.  ?Medicaid application started. See  discussion above. ?No insurance will need meds prior to d/c. HF Fund/Match.  ? ? ?Length of Stay: 12 ? ?FINCH, LINDSAY N, PA-C  ?01/29/2022, 9:57 AM ? ?Advanced Heart Failure Team ?Pager [phone] (M-F; 7a - 5p)  ?Please contact [facility] for night-coverage after hours (5p -7a ) and weekends on [email or web address] ? ?Patient seen and examined with the above-signed Advanced Practice Provider and/or Housestaff. I personally reviewed laboratory data, imaging studies and relevant notes. I independently examined the patient and formulated the important aspects of the plan. I have edited the note to reflect any of my changes or salient points. I have personally  discussed the plan with the patient and/or family. ? ?Co-ox low despite DBA. CVP climbing. Feels weak and SOB.  ? ?General: Lying in bed No resp difficulty ?HEENT: normal ?Neck: supple. JVP to ear. Carotids 2+ bilat; no bruits. No lymphadenopathy or thryomegaly appreciated. ?KDC:MMEEGHI tachy  +s3 ?Lungs: clear ?Abdomen: soft, nontender, nondistended. No hepatosplenomegaly. No bruits or masses. Good bowel sounds. ?Extremities: no cyanosis, clubbing, rash, trace edema ?Neuro: alert & orientedx3, cranial nerves grossly intact. moves all 4 extremities w/o difficulty. Affect pleasant ? ?He is very tenuous. Now inotrope dependent. Increase DBA. Continue IV lasix.  ? ?Not VAD candidate currently.  ? ?Glori Bickers, MD  ?12:29 PM ? ? ? ? ? ?

## 2022-01-30 ENCOUNTER — Encounter (HOSPITAL_COMMUNITY): Payer: Self-pay | Admitting: Internal Medicine

## 2022-01-30 LAB — COOXEMETRY PANEL
Carboxyhemoglobin: 2.1 % — ABNORMAL HIGH (ref 0.5–1.5)
Methemoglobin: <0.7 % (ref 0.0–1.5)
O2 Saturation: 68.9 %
Total hemoglobin: 12.7 g/dL (ref 12.0–16.0)

## 2022-01-30 LAB — BASIC METABOLIC PANEL
Anion gap: 10 (ref 5–15)
BUN: 36 mg/dL — ABNORMAL HIGH (ref 6–20)
CO2: 26 mmol/L (ref 22–32)
Calcium: 9 mg/dL (ref 8.9–10.3)
Chloride: 104 mmol/L (ref 98–111)
Creatinine, Ser: 1.78 mg/dL — ABNORMAL HIGH (ref 0.61–1.24)
GFR, Estimated: 43 mL/min — ABNORMAL LOW (ref >60)
Glucose, Bld: 144 mg/dL — ABNORMAL HIGH (ref 70–99)
Potassium: 3.3 mmol/L — ABNORMAL LOW (ref 3.5–5.1)
Sodium: 140 mmol/L (ref 135–145)

## 2022-01-30 LAB — MAGNESIUM: Magnesium: 2.3 mg/dL (ref 1.7–2.4)

## 2022-01-30 MED ORDER — TORSEMIDE 20 MG PO TABS
40.0000 mg | ORAL_TABLET | Freq: Two times a day (BID) | ORAL | Status: DC
  Administered 2022-01-30 – 2022-02-04 (×11): 40 mg via ORAL
  Filled 2022-01-30 (×10): qty 2

## 2022-01-30 MED ORDER — POTASSIUM CHLORIDE CRYS ER 20 MEQ PO TBCR
40.0000 meq | EXTENDED_RELEASE_TABLET | ORAL | Status: AC
  Administered 2022-01-30 (×2): 40 meq via ORAL
  Filled 2022-01-30 (×2): qty 2

## 2022-01-30 NOTE — Progress Notes (Signed)
? ? Advanced Heart Failure Rounding Note ? ?PCP-Cardiologist: None  ? ?Subjective:   ? ?4/24 Cardiogenic shock. Started on DBA 3 mcg.  Also started on midodrine. Diuresed 20+ lb with IV lasix.  ?4/27 DBA off ?05/03 Recurrent low-output. CO-OX 45%. DBA 2.5 restarted. ? ?Now on DBA 5. Co-ox 49% -> 69%  Diuresed 2.2L on IV lasix. Weight down 2 pounds. SCr slightly improved. CVP  7 prominent v waves ? ?Feeling better. Denies SON, orthopnea or PND. Feels weak  ? ?Objective:   ?Weight Range: ?69.1 kg ?Body mass index is 23.86 kg/m?.  ? ?Vital Signs:   ?Temp:  [97.4 ?F (36.3 ?C)-98.7 ?F (37.1 ?C)] 97.4 ?F (36.3 ?C) (05/06 0756) ?Pulse Rate:  [85-96] 85 (05/06 0756) ?Resp:  [15-20] 20 (05/06 0756) ?BP: (102-115)/(70-84) 115/73 (05/06 0756) ?SpO2:  [94 %-99 %] 94 % (05/06 0756) ?Weight:  [69.1 kg] 69.1 kg (05/06 0318) ?Last BM Date : 01/25/22 ? ?Weight change: ?Filed Weights  ? 01/28/22 0318 01/29/22 0332 01/30/22 0318  ?Weight: 71.2 kg 70.1 kg 69.1 kg  ? ? ?Intake/Output:  ?  ? ?Physical Exam  ? ?General:  Sitting up in bed No resp difficulty ?HEENT: normal ?Neck: supple. CVP 7 Carotids 2+ bilat; no bruits. No lymphadenopathy or thryomegaly appreciated. ?Cor: PMI nondisplaced. Regular rate & rhythm. No rubs, gallops or murmurs. ?Lungs: clear ?Abdomen: soft, nontender, nondistended. No hepatosplenomegaly. No bruits or masses. Good bowel sounds. ?Extremities: no cyanosis, clubbing, rash, edema ?Neuro: alert & orientedx3, cranial nerves grossly intact. moves all 4 extremities w/o difficulty. Affect pleasant ? ? ?Telemetry  ? ?SR 80-90s Personally reviewed ? ? ?Labs  ?  ?CBC ?No results for input(s): WBC, NEUTROABS, HGB, HCT, MCV, PLT in the last 72 hours. ? ?Basic Metabolic Panel ?Recent Labs  ?  01/29/22 ?0350 01/30/22 ?0445  ?NA 138 140  ?K 3.5 3.3*  ?CL 101 104  ?CO2 25 26  ?GLUCOSE 166* 144*  ?BUN 39* 36*  ?CREATININE 1.86* 1.78*  ?CALCIUM 9.1 9.0  ?MG 2.4 2.3  ? ? ?Liver Function Tests ?No results for input(s): AST,  ALT, ALKPHOS, BILITOT, PROT, ALBUMIN in the last 72 hours. ? ?No results for input(s): LIPASE, AMYLASE in the last 72 hours. ?Cardiac Enzymes ?No results for input(s): CKTOTAL, CKMB, CKMBINDEX, TROPONINI in the last 72 hours. ? ?BNP: ?BNP (last 3 results) ?Recent Labs  ?  11/19/21 ?1423 12/21/21 ?1255 01/17/22 ?1400  ?BNP 588.9* 887.4* 2,334.9*  ? ? ? ?ProBNP (last 3 results) ?No results for input(s): PROBNP in the last 8760 hours. ? ? ?D-Dimer ?No results for input(s): DDIMER in the last 72 hours. ?Hemoglobin A1C ?No results for input(s): HGBA1C in the last 72 hours. ? ?Fasting Lipid Panel ?No results for input(s): CHOL, HDL, LDLCALC, TRIG, CHOLHDL, LDLDIRECT in the last 72 hours. ?Thyroid Function Tests ?No results for input(s): TSH, T4TOTAL, T3FREE, THYROIDAB in the last 72 hours. ? ?Invalid input(s): FREET3 ? ?Other results: ? ? ?Imaging  ? ? ?No results found. ? ? ?Medications:   ? ? ?Scheduled Medications: ? amiodarone  200 mg Oral BID  ? aspirin  81 mg Oral Daily  ? atorvastatin  40 mg Oral Daily  ? Chlorhexidine Gluconate Cloth  6 each Topical Daily  ? dapagliflozin propanediol  10 mg Oral Daily  ? digoxin  0.0625 mg Oral Daily  ? furosemide  80 mg Intravenous BID  ? heparin  5,000 Units Subcutaneous Q8H  ? potassium chloride  40 mEq Oral Q4H  ? sodium  chloride flush  10-40 mL Intracatheter Q12H  ? ? ?Infusions: ? DOBUTamine 5 mcg/kg/min (01/30/22 0700)  ? ? ? ?PRN Medications: ?acetaminophen, melatonin, sodium chloride, sodium chloride flush ? ? ? ?Patient Profile  ? ?  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 Cath with 1-v CAD with chronically occluded RCA. MRI in January 2011 with EF 31% with inferior scar.  ?  ?Admitted with A/C HFrEF-->shock.  ? ?Assessment/Plan  ? ?1. A/C HFrEF --> Shock suspect cardiogenic. ?Cath with 1-v CAD with chronically occluded RCA. MRI in January 2011 with EF 31% with inferior scar.  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  EF 45-50% on echo in 8/13.  ?- No f/u between to 2015-2021 ?- Echo 6/21 EF 20-25% severe central MR. Repeat cath 6/21 with stable CAD ?- Echo 01/05/21 EF 40-45% mild to mod MR RV mild HK  ?- Echo this admit 4/23 EF 30-35%.   ?- He had been off all meds for ~ 1 month.  ?- Started on DBA and lasix gtt w/ 20 lb diuresis.  ?- DBA off 04/27. Recurrent low-output and DBA restarted 05/03. ?-CO-OX 44%>>49% on 2.5 DBA, increase to 5 mcg/kg/min Co-ox 69% this am  ?- Diuresing well on IV lasix. SCr improved. CVP 7. Switch to po torsemide ?- Continue Farxiga 10 mg daily  ?- Continue digoxin 0.125, dig level okay 05/02 ?- He is end-stage and inotrope dependent. Not a good candidate for advanced therapies with lack of support system and does not want this. We are in the process of trying to get him insurance coverage for  home inotropes. (See below) ?- Currently has no insurance. Medicaid pending. Discussed with [employer] home infusion rep, Carolynn Sayers. Would need Medicare or LOG from [facility] in order to qualify for home inotrope.  Will review with HF TOC CM. ? ?2. AKI on CKD Stage IIIa ?- Due to cardiorenal syndrome ?- Creatinine baseline 1.4 ->admit 1.7->1.9 -> 1.35 ->-> 1.8 ->  1.7 ?-Renal US- no hydronephrosis.  ?  ?3. PVCs ?- Continue amio 200 mg twice a day.  ?- Well suppressed ? ?4. CAD  ? -Cath with 1-v CAD with chronically occluded RCA ?- HS Trop 15  ?- No s/s angina ?- Continue aspirin and atorvastatin.  ?  ?5. Hypokalemia  ? - K 3.3 ?- Will supp  ? ? ?Length of Stay: 13 ? ?Glori Bickers, MD  ?01/30/2022, 9:17 AM ? ?Advanced Heart Failure Team ?Pager [phone] (M-F; 7a - 5p)  ?Please contact [facility] for night-coverage after hours (5p -7a ) and weekends on [email or web address] ? ? ? ? ? ?

## 2022-01-30 NOTE — Progress Notes (Signed)
?Progress Note ? ? ?Patient: Zachary Arias [id number] DOB: [birth year] DOA: 01/17/2022     13 ?DOS: the patient was seen and examined on 01/30/2022 ?  ?Brief hospital course: ?Mr. Marijean Bravo was admitted to the hospital with the working diagnosis of decompensated heart failure.  ? ?Vedanth Buckalew is a 61 y.o. male with a history of combined CHF, ischemic cardiomyopathy, CAD, hypertension, CKD stage IIIa, frequent PVCs on amiodarone. Patient presented secondary to shortness of breath and LE swelling.  Over the last 2 weeks prior to hospitalization he developed worsening dyspnea and lower extremity edema, refractive to increasing dose and frequency of furosemide. Positive weight gain, more than 8 lbs over last 8 weeks. On his initial physical examination his blood pressure was 125/91, HR 106, RR 16 and 02 saturation 97% on room air. Lungs with bilateral rales at bases, heart with S1 and S2 present and rhythmic, abdomen not distended, positive lower extremity edema ++.  ? ?Na 143, K 4,3 CL 110, bicarbonate 21 glucose 128, bun 19 cr 1,69 ?BNP 2,334  ?Lactic acid 1,9 and 2,7  ?Wbc 18,8, hgb 14,8 plt 262  ? ?Chest radiograph with cardiomegaly, bilateral hilar vascular congestion, positive fluid in the right fissures.  ? ?EKG 102 bpm, normal axis, normal intervals, sinus rhythm with left atrial enlargement, poor R wave progression, no significant ST segment or T wave changes.  ? ?Patient was placed on furosemide for diuresis, developed low output failure (cardiogenic shock), requires inotropic support.  ? ?While admitted, patient expressed concern for right ankle pain. Found to have probably tendon tear. ? ?Clinically improved but not yet back to baseline. ?Not able to wean off inotropic support.  ? ?Assessment and Plan: ?Congestive heart failure ([place]) ?Acute on chronic systolic heart failure.  ?Pulmonary hypertension acute on chronic core pulmonale.  ? ?Echocardiogram with reduced LV systolic function with EF 30 to 35%. Severe  dilatation of internal cavity. Mild enlargement of RV. Severe elevation in pulmonary artery systolic pressure, RVSP 64 mmHg. Severe dilatation of left atrium. Posterior trivial pericardial effusion. Moderate TR.  ? ?Urine output over last 24 hrs is 2,200 ml. ?Systolic blood pressure 99991111 to 115 mmHg range.  ?SV02 68.9 ? ?On dobutamine infusion at 5 mcg/kg/min ?Metolazone 2,5 05/05.  ?Furosemide 80 mg bid IV.  ?Continue with dapagliflozin and digoxin  ? ?PVC, ectopy continue with amiodarone with good toleration.   ? ?Acute kidney injury superimposed on chronic kidney disease ([place]) ?CKd stage 3a. Hypokalemia  ? ?Renal function with serum cr t 1,78 with K at 3,3 and serum bicarbonate at 26. Mg 2,3  ? ?Add 80 meq Kcl in 2 divided doses.  ?Continue diuresis with furosemide and inotropic support with dobutamine. ?Plan to follow up renal function and electrolytes in am.  ?Net fluid balance is negative -27,989 since admission.  ? ?Right ankle pain ?Reactive leukocytosis.  ?Pain did not respond to prednisone.  ?Plan for outpatient MRI follow up with orthopedics.  ? ?Dyslipidemia ?Continue with statin therapy.  ? ? ? ? ?  ? ?Subjective: Patient was not able to sleep last  night but denies orthopnea worsening dyspnea.  ? ?Physical Exam: ?Vitals:  ? 01/29/22 2250 01/30/22 0000 01/30/22 0318 01/30/22 0756  ?BP: 104/81  111/84 115/73  ?Pulse: 95  96 85  ?Resp: 18 18 18 20   ?Temp: 98 ?F (36.7 ?C)  97.6 ?F (36.4 ?C) (!) 97.4 ?F (36.3 ?C)  ?TempSrc: Oral  Oral Oral  ?SpO2: 98%  97% 94%  ?Weight:  69.1 kg   ?Height:      ? ?Neurology awake and alert ?ENT with no pallor ?Cardiovascular with S1 and S2 present and rhythmic with no gallops, or rubs, positive murmur at the right sternal border systolic ?Mild JVD ?No lower extremity edema  ?Respiratory with mild rales at bases ?Abdomen not distended  ?Data Reviewed: ? ? ? ?Family Communication: no family at the bedside  ? ?Disposition: ?Status is: Inpatient ?Remains inpatient appropriate  because: IV diuresis and inotropic support.  ? Planned Discharge Destination: Home ? ? ? ?Author: ?Tawni Millers, MD ?01/30/2022 8:49 AM ? ?For on call review [email or web address]  ?

## 2022-01-30 NOTE — Progress Notes (Signed)
Mobility Specialist Progress Note: ? ? 01/30/22 1545  ?Mobility  ?Activity Ambulated independently in hallway  ?Level of Assistance Modified independent, requires aide device or extra time  ?Assistive Device Other (Comment) ?(IV Pole)  ?Distance Ambulated (ft) 500 ft  ?Activity Response Tolerated well  ?$Mobility charge 1 Mobility  ? ?Pre Mobility: 97% RA ?During Mobility: SpO2 91-99% RA ? ?Pt agreeable to mobility session. Ambulated at [place] level with IV pole, asx throughout. Pt back in bed with all needs met. ? ? ?Nelta Numbers ?Acute Rehab ?Phone: [phone] ?Office Phone: [phone] ? ?

## 2022-01-31 LAB — COOXEMETRY PANEL
Carboxyhemoglobin: 0.7 % (ref 0.5–1.5)
Methemoglobin: <0.7 % (ref 0.0–1.5)
O2 Saturation: 53.3 %
Total hemoglobin: 14.1 g/dL (ref 12.0–16.0)

## 2022-01-31 LAB — MAGNESIUM: Magnesium: 2.3 mg/dL (ref 1.7–2.4)

## 2022-01-31 LAB — BASIC METABOLIC PANEL
Anion gap: 10 (ref 5–15)
BUN: 33 mg/dL — ABNORMAL HIGH (ref 6–20)
CO2: 27 mmol/L (ref 22–32)
Calcium: 8.7 mg/dL — ABNORMAL LOW (ref 8.9–10.3)
Chloride: 108 mmol/L (ref 98–111)
Creatinine, Ser: 1.6 mg/dL — ABNORMAL HIGH (ref 0.61–1.24)
GFR, Estimated: 49 mL/min — ABNORMAL LOW (ref >60)
Glucose, Bld: 128 mg/dL — ABNORMAL HIGH (ref 70–99)
Potassium: 3.4 mmol/L — ABNORMAL LOW (ref 3.5–5.1)
Sodium: 145 mmol/L (ref 135–145)

## 2022-01-31 MED ORDER — POTASSIUM CHLORIDE CRYS ER 20 MEQ PO TBCR
40.0000 meq | EXTENDED_RELEASE_TABLET | ORAL | Status: AC
  Administered 2022-01-31: 40 meq via ORAL
  Filled 2022-01-31: qty 2

## 2022-01-31 MED ORDER — POTASSIUM CHLORIDE 10 MEQ/100ML IV SOLN
10.0000 meq | Freq: Once | INTRAVENOUS | Status: AC
  Administered 2022-01-31: 10 meq via INTRAVENOUS
  Filled 2022-01-31: qty 100

## 2022-01-31 NOTE — Progress Notes (Signed)
?Progress Note ? ? ?Patient: Zachary Arias [id number] DOB: [birth year] DOA: 01/17/2022     14 ?DOS: the patient was seen and examined on 01/31/2022 ?  ?Brief hospital course: ?[name] was admitted to the hospital with the working diagnosis of decompensated heart failure.  ? ?Zachary Arias is a 61 y.o. male with a history of combined CHF, ischemic cardiomyopathy, CAD, hypertension, CKD stage IIIa, frequent PVCs on amiodarone. Patient presented secondary to shortness of breath and LE swelling.  Over the last 2 weeks prior to hospitalization he developed worsening dyspnea and lower extremity edema, refractive to increasing dose and frequency of furosemide. Positive weight gain, more than 8 lbs over last 8 weeks. On his initial physical examination his blood pressure was 125/91, HR 106, RR 16 and 02 saturation 97% on room air. Lungs with bilateral rales at bases, heart with S1 and S2 present and rhythmic, abdomen not distended, positive lower extremity edema ++.  ? ?Na 143, K 4,3 CL 110, bicarbonate 21 glucose 128, bun 19 cr 1,69 ?BNP 2,334  ?Lactic acid 1,9 and 2,7  ?Wbc 18,8, hgb 14,8 plt 262  ? ?Chest radiograph with cardiomegaly, bilateral hilar vascular congestion, positive fluid in the right fissures.  ? ?EKG 102 bpm, normal axis, normal intervals, sinus rhythm with left atrial enlargement, poor R wave progression, no significant ST segment or T wave changes.  ? ?Patient was placed on furosemide for diuresis, developed low output failure (cardiogenic shock), requires inotropic support.  ? ?While admitted, patient expressed concern for right ankle pain. Found to have probably tendon tear. ? ?Not able to wean off inotropic support.  ?Plan for possible dc home with inotropic infusion.  ? ?Assessment and Plan: ?Congestive heart failure ([place]) ?Acute on chronic systolic heart failure.  ?Pulmonary hypertension acute on chronic core pulmonale.  ? ?Echocardiogram with reduced LV systolic function with EF 30 to 35%. Severe  dilatation of internal cavity. Mild enlargement of RV. Severe elevation in pulmonary artery systolic pressure, RVSP 64 mmHg. Severe dilatation of left atrium. Posterior trivial pericardial effusion. Moderate TR.  ? ?Urine output over last 24 hrs is 3,160 ml. ?Systolic blood pressure [id number] to 110 mmHg range.  ?SV02 53.3 ? ?Continue with dobutamine infusion at 5 mcg/kg/min ?Metolazone 2,5 mg 05/05.  ?Transitioned to torsemide 40 mg bid  ?On dapagliflozin and digoxin  ? ?PVC, ectopy continue with amiodarone with good toleration.   ?Telemetry with only occasional PVC, personally reviewed.  ? ?Acute kidney injury superimposed on chronic kidney disease ([place]) ?CKd stage 3a. Hypokalemia  ? ?Today renal function with serum cr at 1,60, K [place] 3,4 and serum bicarbonate at 27.  ? ?Plan for 80 meq Kcl in 2 divided doses.  ?Transitioned to oral diuretic therapy with torsemide.  ? ?Right ankle pain ?Reactive leukocytosis.  ?Pain did not respond to prednisone.  ?Plan for outpatient MRI follow up with orthopedics.  ? ?Dyslipidemia ?Continue with statin therapy.  ? ? ? ? ?  ? ?Subjective: Patient with no chest pain, only mild dyspnea on exertion, no edema  ? ?Physical Exam: ?Vitals:  ? 01/30/22 2359 01/31/22 0417 01/31/22 0551 01/31/22 0750  ?BP: 106/79 110/83  108/65  ?Pulse: 91 79  84  ?Resp: 18 15  16   ?Temp: 98.4 ?F (36.9 ?C) 98.3 ?F (36.8 ?C)  (!) 97.5 ?F (36.4 ?C)  ?TempSrc: Oral Oral  Oral  ?SpO2: 96% 97%  96%  ?Weight:   68.3 kg   ?Height:      ? ?Neurology awake  and alert ?ENT with no pallor ?Cardiovascular with S1 and S2 present and rhythmic with systolic murmur at the apex, no gallops ?No JVD ?No lower extremity edema ?Respiratory with no rales or wheezing, no rhonchi ?Abdomen not distended  ?Data Reviewed: ? ? ? ?Family Communication: no family at the bedside  ? ?Disposition: ?Status is: Inpatient ?Remains inpatient appropriate because: IV inotropic support  ? Planned Discharge Destination: Home ? ? ? ? ? ?Author: ?Tawni Millers, MD ?01/31/2022 9:26 AM ? ?For on call review [email or web address].  ?

## 2022-01-31 NOTE — Progress Notes (Signed)
? ? Advanced Heart Failure Rounding Note ? ?PCP-Cardiologist: None  ? ?Subjective:   ? ?4/24 Cardiogenic shock. Started on DBA 3 mcg.  Also started on midodrine. Diuresed 20+ lb with IV lasix.  ?4/27 DBA off ?05/03 Recurrent low-output. CO-OX 45%. DBA 2.5 restarted. ? ?Now on DBA 5. Co-ox 69% -> 53%   ? ?Switched to po torsemide yesterday. Weight down 2 pounds. SCr improved, K 3.4. CVP 10-11 ? ?Had some SOB this am while eating and put O2 on. Now feels ok. No orthopnea or PND.  ? ?Objective:   ?Weight Range: ?68.3 kg ?Body mass index is 23.58 kg/m?.  ? ?Vital Signs:   ?Temp:  [97.6 ?F (36.4 ?C)-98.9 ?F (37.2 ?C)] 98.3 ?F (36.8 ?C) (05/07 0417) ?Pulse Rate:  [79-98] 79 (05/07 0417) ?Resp:  [13-20] 15 (05/07 0417) ?BP: (104-110)/(76-83) 110/83 (05/07 0417) ?SpO2:  [95 %-98 %] 97 % (05/07 0417) ?Weight:  [68.3 kg] 68.3 kg (05/07 0551) ?Last BM Date : 01/25/22 ? ?Weight change: ?Filed Weights  ? 01/29/22 0332 01/30/22 0318 01/31/22 0551  ?Weight: 70.1 kg 69.1 kg 68.3 kg  ? ? ?Intake/Output:  ?  ? ?Physical Exam  ? ?General:  Sitting up in bed. No resp difficulty ?HEENT: normal ?Neck: supple. JVP 10 Carotids 2+ bilat; no bruits. No lymphadenopathy or thryomegaly appreciated. ?Cor: Regular rate & rhythm. +s3 ?Lungs: clear ?Abdomen: soft, nontender, nondistended. No hepatosplenomegaly. No bruits or masses. Good bowel sounds. ?Extremities: no cyanosis, clubbing, rash, edema ?Neuro: alert & orientedx3, cranial nerves grossly intact. moves all 4 extremities w/o difficulty. Affect pleasant ? ?Telemetry  ? ?SR 70-80s Personally reviewed ? ? ?Labs  ?  ?CBC ?No results for input(s): WBC, NEUTROABS, HGB, HCT, MCV, PLT in the last 72 hours. ? ?Basic Metabolic Panel ?Recent Labs  ?  01/30/22 ?0445 01/31/22 ?[id number]  ?NA 140 145  ?K 3.3* 3.4*  ?CL 104 108  ?CO2 26 27  ?GLUCOSE 144* 128*  ?BUN 36* 33*  ?CREATININE 1.78* 1.60*  ?CALCIUM 9.0 8.7*  ?MG 2.3 2.3  ? ? ?Liver Function Tests ?No results for input(s): AST, ALT, ALKPHOS,  BILITOT, PROT, ALBUMIN in the last 72 hours. ? ?No results for input(s): LIPASE, AMYLASE in the last 72 hours. ?Cardiac Enzymes ?No results for input(s): CKTOTAL, CKMB, CKMBINDEX, TROPONINI in the last 72 hours. ? ?BNP: ?BNP (last 3 results) ?Recent Labs  ?  11/19/21 ?1423 12/21/21 ?1255 01/17/22 ?1400  ?BNP 588.9* 887.4* 2,334.9*  ? ? ? ?ProBNP (last 3 results) ?No results for input(s): PROBNP in the last 8760 hours. ? ? ?D-Dimer ?No results for input(s): DDIMER in the last 72 hours. ?Hemoglobin A1C ?No results for input(s): HGBA1C in the last 72 hours. ? ?Fasting Lipid Panel ?No results for input(s): CHOL, HDL, LDLCALC, TRIG, CHOLHDL, LDLDIRECT in the last 72 hours. ?Thyroid Function Tests ?No results for input(s): TSH, T4TOTAL, T3FREE, THYROIDAB in the last 72 hours. ? ?Invalid input(s): FREET3 ? ?Other results: ? ? ?Imaging  ? ? ?No results found. ? ? ?Medications:   ? ? ?Scheduled Medications: ? amiodarone  200 mg Oral BID  ? aspirin  81 mg Oral Daily  ? atorvastatin  40 mg Oral Daily  ? Chlorhexidine Gluconate Cloth  6 each Topical Daily  ? dapagliflozin propanediol  10 mg Oral Daily  ? digoxin  0.0625 mg Oral Daily  ? heparin  5,000 Units Subcutaneous Q8H  ? sodium chloride flush  10-40 mL Intracatheter Q12H  ? torsemide  40 mg Oral BID  ? ? ?  Infusions: ? DOBUTamine 5 mcg/kg/min (01/30/22 1943)  ? ? ? ?PRN Medications: ?acetaminophen, melatonin, sodium chloride, sodium chloride flush ? ? ? ?Patient Profile  ? ?  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 Cath with 1-v CAD with chronically occluded RCA. MRI in January 2011 with EF 31% with inferior scar.  ?  ?Admitted with A/C HFrEF-->shock.  ? ?Assessment/Plan  ? ?1. A/C HFrEF --> Shock suspect cardiogenic. ?Cath with 1-v CAD with chronically occluded RCA. MRI in January 2011 with EF 31% with inferior scar.  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  EF 45-50% on echo in 8/13.  ?- No f/u between to 2015-2021 ?- Echo 6/21 EF 20-25% severe central MR. Repeat cath 6/21 with stable CAD ?- Echo 01/05/21 EF 40-45% mild to mod MR RV mild HK  ?- Echo this admit 4/23 EF 30-35%.   ?- He had been off all meds for ~ 1 month.  ?- Started on DBA and lasix gtt w/ 20 lb diuresis.  ?- DBA off 04/27. Recurrent low-output and DBA restarted 05/03. ?- On DBA 5 mcg/kg/min Co-ox 69%-> 53 %this am  ?- Back on po torsemide 5/6. CVP 10. Continue torsemide. Can give a dose of metolazone tomorrow if CVP climbing ?- Continue Farxiga 10 mg daily  ?- Continue digoxin 0.125, dig level okay 05/02 ?- He is end-stage and inotrope dependent. Not a good candidate for advanced therapies with lack of support system and does not want this. We are in the process of trying to get him insurance coverage for  home inotropes. (See below) ?- Currently has no insurance. Medicaid pending. Discussed with [employer] home infusion rep, Carolynn Sayers. Would need Medicare or LOG from [facility] in order to qualify for home inotrope.  Will review with HF TOC CM. ? ?2. AKI on CKD Stage IIIa ?- Due to cardiorenal syndrome ?- Creatinine baseline 1.4 ->admit 1.7->1.9 -> 1.35 ->-> 1.8 ->  1.7-> 1.6 ?-Renal US- no hydronephrosis.  ?  ?3. PVCs ?- Continue amio 200 mg twice a day.  ?- Well suppressed ? ?4. CAD  ? -Cath with 1-v CAD with chronically occluded RCA ?- HS Trop 15  ?- No s/s angina ?- Continue aspirin and atorvastatin.  ?  ?5. Hypokalemia  ? - K 3.4 ?- Will supp ? ? ?Length of Stay: 14 ? ?Glori Bickers, MD  ?01/31/2022, 8:16 AM ? ?Advanced Heart Failure Team ?Pager [phone] (M-F; 7a - 5p)  ?Please contact [facility] for night-coverage after hours (5p -7a ) and weekends on [email or web address] ? ? ? ? ? ?

## 2022-01-31 NOTE — Progress Notes (Signed)
Mobility Specialist Progress Note: ? ? 01/31/22 1500  ?Mobility  ?Activity Ambulated with assistance in hallway  ?Level of Assistance Modified independent, requires aide device or extra time  ?Assistive Device Other (Comment) ?(IV Pole)  ?Distance Ambulated (ft) 500 ft  ?Activity Response Tolerated well  ?$Mobility charge 1 Mobility  ? ?Pt agreeable to mobility session. Required no physical assistance throughout. Pt left sitting EOB with all needs met.  ? ?Zachary Arias ?Acute Rehab ?Phone: [phone] ?Office Phone: [phone] ? ?

## 2022-02-01 LAB — BASIC METABOLIC PANEL
Anion gap: 12 (ref 5–15)
BUN: 33 mg/dL — ABNORMAL HIGH (ref 6–20)
CO2: 30 mmol/L (ref 22–32)
Calcium: 9 mg/dL (ref 8.9–10.3)
Chloride: 103 mmol/L (ref 98–111)
Creatinine, Ser: 1.99 mg/dL — ABNORMAL HIGH (ref 0.61–1.24)
GFR, Estimated: 38 mL/min — ABNORMAL LOW (ref >60)
Glucose, Bld: 169 mg/dL — ABNORMAL HIGH (ref 70–99)
Potassium: 3.8 mmol/L (ref 3.5–5.1)
Sodium: 145 mmol/L (ref 135–145)

## 2022-02-01 LAB — COOXEMETRY PANEL
Carboxyhemoglobin: 1.6 % — ABNORMAL HIGH (ref 0.5–1.5)
Methemoglobin: <0.7 % (ref 0.0–1.5)
O2 Saturation: 74 %
Total hemoglobin: 13.4 g/dL (ref 12.0–16.0)

## 2022-02-01 LAB — MAGNESIUM: Magnesium: 2.4 mg/dL (ref 1.7–2.4)

## 2022-02-01 MED ORDER — POTASSIUM CHLORIDE CRYS ER 20 MEQ PO TBCR
40.0000 meq | EXTENDED_RELEASE_TABLET | Freq: Once | ORAL | Status: AC
  Administered 2022-02-01: 40 meq via ORAL
  Filled 2022-02-01: qty 2

## 2022-02-01 NOTE — Progress Notes (Addendum)
? ? Advanced Heart Failure Rounding Note ? ?PCP-Cardiologist: None  ? ?Subjective:   ? ?4/24 Cardiogenic shock. Started on DBA 3 mcg.  Also started on midodrine. Diuresed 20+ lb with IV lasix.  ?4/27 DBA off ?05/03 Recurrent low-output. CO-OX 45%. DBA 2.5 restarted. ? ?Now on DBA 5. Co-ox  74%  ? ?Diuresing with torsemide. Weight down another 3 pounds.  ? ?No complaints.  ? ?Objective:   ?Weight Range: ?66.8 kg ?Body mass index is 23.07 kg/m?.  ? ?Vital Signs:   ?Temp:  [97.5 ?F (36.4 ?C)-98.5 ?F (36.9 ?C)] 98.2 ?F (36.8 ?C) (05/08 0446) ?Pulse Rate:  [82-100] 83 (05/08 0446) ?Resp:  [16-20] 18 (05/08 0446) ?BP: (100-112)/(65-83) 109/69 (05/08 0446) ?SpO2:  [93 %-99 %] 99 % (05/08 0446) ?Weight:  [66.8 kg] 66.8 kg (05/08 0604) ?Last BM Date : 01/31/22 ? ?Weight change: ?Filed Weights  ? 01/30/22 0318 01/31/22 0551 02/01/22 0604  ?Weight: 69.1 kg 68.3 kg 66.8 kg  ? ? ?Intake/Output:  ? CVP 5 personally checked.  ? ?Physical Exam  ?General:  Well appearing. No resp difficulty ?HEENT: normal ?Neck: supple. no JVD. Carotids 2+ bilat; no bruits. No lymphadenopathy or thryomegaly appreciated. ?Cor: PMI nondisplaced. Regular rate & rhythm. No rubs, or murmurs. +S#  ?Lungs: clear ?Abdomen: soft, nontender, nondistended. No hepatosplenomegaly. No bruits or masses. Good bowel sounds. ?Extremities: no cyanosis, clubbing, rash, edema. RUE triple lumen picc  ?Neuro: alert & orientedx3, cranial nerves grossly intact. moves all 4 extremities w/o difficulty. Affect pleasant ? ?Telemetry  ? ?SR 80-90s   ? ?Labs  ?  ?CBC ?No results for input(s): WBC, NEUTROABS, HGB, HCT, MCV, PLT in the last 72 hours. ? ?Basic Metabolic Panel ?Recent Labs  ?  01/30/22 ?0445 01/31/22 ?[id number]  ?NA 140 145  ?K 3.3* 3.4*  ?CL 104 108  ?CO2 26 27  ?GLUCOSE 144* 128*  ?BUN 36* 33*  ?CREATININE 1.78* 1.60*  ?CALCIUM 9.0 8.7*  ?MG 2.3 2.3  ? ?Liver Function Tests ?No results for input(s): AST, ALT, ALKPHOS, BILITOT, PROT, ALBUMIN in the last 72 hours. ? ?No  results for input(s): LIPASE, AMYLASE in the last 72 hours. ?Cardiac Enzymes ?No results for input(s): CKTOTAL, CKMB, CKMBINDEX, TROPONINI in the last 72 hours. ? ?BNP: ?BNP (last 3 results) ?Recent Labs  ?  11/19/21 ?1423 12/21/21 ?1255 01/17/22 ?1400  ?BNP 588.9* 887.4* 2,334.9*  ? ? ?ProBNP (last 3 results) ?No results for input(s): PROBNP in the last 8760 hours. ? ? ?D-Dimer ?No results for input(s): DDIMER in the last 72 hours. ?Hemoglobin A1C ?No results for input(s): HGBA1C in the last 72 hours. ? ?Fasting Lipid Panel ?No results for input(s): CHOL, HDL, LDLCALC, TRIG, CHOLHDL, LDLDIRECT in the last 72 hours. ?Thyroid Function Tests ?No results for input(s): TSH, T4TOTAL, T3FREE, THYROIDAB in the last 72 hours. ? ?Invalid input(s): FREET3 ? ?Other results: ? ? ?Imaging  ? ? ?No results found. ? ? ?Medications:   ? ? ?Scheduled Medications: ? amiodarone  200 mg Oral BID  ? aspirin  81 mg Oral Daily  ? atorvastatin  40 mg Oral Daily  ? Chlorhexidine Gluconate Cloth  6 each Topical Daily  ? dapagliflozin propanediol  10 mg Oral Daily  ? digoxin  0.0625 mg Oral Daily  ? heparin  5,000 Units Subcutaneous Q8H  ? sodium chloride flush  10-40 mL Intracatheter Q12H  ? torsemide  40 mg Oral BID  ? ? ?Infusions: ? DOBUTamine 5 mcg/kg/min (01/30/22 1943)  ? ? ? ?PRN  Medications: ?acetaminophen, melatonin, sodium chloride, sodium chloride flush ? ? ? ?Patient Profile  ? ?  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 Cath with 1-v CAD with chronically occluded RCA. MRI in January 2011 with EF 31% with inferior scar.  ?  ?Admitted with A/C HFrEF-->shock.  ? ?Assessment/Plan  ? ?1. A/C HFrEF --> Shock suspect cardiogenic. ?Cath with 1-v CAD with chronically occluded RCA. MRI in January 2011 with EF 31% with inferior scar.  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  EF 45-50% on echo in 8/13.  ?- No f/u between  to 2015-2021 ?- Echo 6/21 EF 20-25% severe central MR. Repeat cath 6/21 with stable CAD ?- Echo 01/05/21 EF 40-45% mild to mod MR RV mild HK  ?- Echo this admit 4/23 EF 30-35%.   ?- He had been off all meds for ~ 1 month.  ?- Started on DBA and lasix gtt w/ 20 lb diuresis.  ?- DBA off 04/27. Recurrent low-output and DBA restarted 05/03. ?- On DBA 5 mcg/kg/min Co-ox 74%  ?- Back on po torsemide 5/6.  ?. Continue torsemide 40 mg twice a day   ?- Continue Farxiga 10 mg daily  ?- Continue digoxin 0.125, dig level okay 05/02 ?- He is end-stage and inotrope dependent. Not a good candidate for advanced therapies with lack of support system and does not want this. We are in the process of trying to get him insurance coverage for  home inotropes. (See below) ?- Currently has no insurance. Medicaid pending. Discussed with [employer] home infusion rep, Jeri Modena. Would need Medicare or LOG from [facility] in order to qualify for home inotrope.   ?- I will reach out to HF CM/SW today . ?- He will need PICC exchange to single lumen at d/c  ? ?2. AKI on CKD Stage IIIa ?- Due to cardiorenal syndrome ?- Creatinine baseline 1.4 ->admit 1.7->1.9 -> 1.35 ->-> 1.8 ->  1.7-> 1.6->pending  ?-Renal US- no hydronephrosis.  ?  ?3. PVCs ?- Continue amio 200 mg twice a day.  ?- Well suppressed ? ?4. CAD  ? -Cath with 1-v CAD with chronically occluded RCA ?- HS Trop 15  ?- No s/s angina ?- Continue aspirin and atorvastatin.  ?  ?5. Hypokalemia  ? - BMET pending.  ? ?Length of Stay: 15 ? ?Tonye Becket, NP  ?02/01/2022, 7:13 AM ? ?Advanced Heart Failure Team ?Pager [phone] (M-F; 7a - 5p)  ?Please contact [facility] for night-coverage after hours (5p -7a ) and weekends on [email or web address] ? ?Patient seen and examined with the above-signed Advanced Practice Provider and/or Housestaff. I personally reviewed laboratory data, imaging studies and relevant notes. I independently examined the patient and formulated the important aspects of the plan. I have edited  the note to reflect any of my changes or salient points. I have personally discussed the plan with the patient and/or family. ? ?Remains on DBA 5. Co-ox and volume status stable. Still with some DOE. No orthopnea or PND.  ? ?BMET pending. ? ?General:  Well appearing. No resp difficulty ?HEENT: normal ?Neck: supple. no JVD. Carotids 2+ bilat; no bruits. No lymphadenopathy or thryomegaly appreciated. ?Cor: Regular tachy  ?Lungs: clear ?Abdomen: soft, nontender, nondistended. No hepatosplenomegaly. No bruits or masses. Good bowel sounds. ?Extremities: no cyanosis, clubbing, rash, edema ?Neuro: alert & orientedx3, cranial nerves grossly intact. moves all 4 extremities w/o difficulty. Affect pleasant ? ?He  is now inotrope dependent. He has no insurance. Will need to continue to work with SW to get coverage for DBA.  ? ?Glori Bickers, MD  ?8:15 AM ? ? ? ? ? ? ? ?

## 2022-02-01 NOTE — Plan of Care (Signed)

## 2022-02-01 NOTE — Progress Notes (Signed)
?Progress Note ? ? ?Patient: Zachary Arias [id number] DOB: 11-19-1960 DOA: 01/17/2022     15 ?DOS: the patient was seen and examined on 02/01/2022 ?  ?Brief hospital course: ?Mr. Marijean Bravo was admitted to the hospital with the working diagnosis of decompensated heart failure.  ? ?Imhotep Score is a 61 y.o. male with a history of combined CHF, ischemic cardiomyopathy, CAD, hypertension, CKD stage IIIa, frequent PVCs on amiodarone. Patient presented secondary to shortness of breath and LE swelling.  Over the last 2 weeks prior to hospitalization he developed worsening dyspnea and lower extremity edema, refractive to increasing dose and frequency of furosemide. Positive weight gain, more than 8 lbs over last 8 weeks. On his initial physical examination his blood pressure was 125/91, HR 106, RR 16 and 02 saturation 97% on room air. Lungs with bilateral rales at bases, heart with S1 and S2 present and rhythmic, abdomen not distended, positive lower extremity edema ++.  ? ?Na 143, K 4,3 CL 110, bicarbonate 21 glucose 128, bun 19 cr 1,69 ?BNP 2,334  ?Lactic acid 1,9 and 2,7  ?Wbc 18,8, hgb 14,8 plt 262  ? ?Chest radiograph with cardiomegaly, bilateral hilar vascular congestion, positive fluid in the right fissures.  ? ?EKG 102 bpm, normal axis, normal intervals, sinus rhythm with left atrial enlargement, poor R wave progression, no significant ST segment or T wave changes.  ? ?Patient was placed on furosemide for diuresis, developed low output failure (cardiogenic shock), requires inotropic support.  ? ?While admitted, patient expressed concern for right ankle pain. Found to have probably tendon tear. ? ?Not able to wean off inotropic support.  ?Plan for possible dc home with inotropic infusion.  ? ?Assessment and Plan: ?Congestive heart failure ([place]) ?Acute on chronic systolic heart failure.  ?Pulmonary hypertension acute on chronic core pulmonale.  ? ?Echocardiogram with reduced LV systolic function with EF 30 to 35%. Severe  dilatation of internal cavity. Mild enlargement of RV. Severe elevation in pulmonary artery systolic pressure, RVSP 64 mmHg. Severe dilatation of left atrium. Posterior trivial pericardial effusion. Moderate TR.  ? ?Urine output over last 24 hrs is 3,075 ml. ?Systolic blood pressure 0000000 to 110 mmHg range.  ?SV02 74 ? ?Continue with dobutamine infusion at 5 mcg/kg/min ?Metolazone 2,5 mg 05/05.  ?On torsemide 40 mg bid  ?Continue with dapagliflozin and digoxin  ? ?PVC, ectopy continue with amiodarone with good toleration.   ? ? ?Acute kidney injury superimposed on chronic kidney disease ([place]) ?CKd stage 3a. Hypokalemia  ? ?Renal function with serum cr at 1,99 with K at 3,8 and serum bicarbonate at 30. ?Mg 2,4  ? ?Add 40 meq Kcl to prevent and follow up with renal function in am. ?Continue inotropic support and diuresis with torsemide.   ? ?Right ankle pain ?Reactive leukocytosis.  ?Pain did not respond to prednisone.  ?Plan for outpatient MRI follow up with orthopedics.  ? ?Dyslipidemia ?Continue with statin therapy.  ? ? ? ? ?  ? ?Subjective: Patient with no chest pain, dyspnea continue to improve.  ? ?Physical Exam: ?Vitals:  ? 01/31/22 2321 02/01/22 0446 02/01/22 0604 02/01/22 0805  ?BP: 104/75 109/69  110/75  ?Pulse: 84 83  85  ?Resp: 18 18    ?Temp: 98.3 ?F (36.8 ?C) 98.2 ?F (36.8 ?C)  (!) 97.4 ?F (36.3 ?C)  ?TempSrc: Oral Oral    ?SpO2: 93% 99%    ?Weight:   66.8 kg   ?Height:      ? ?Neurology awake and alert ?ENT  with no pallor ?Cardiovascular with S1 and S2 present and rhythmic, positive systolic at the right sternal border, no gallops ?No JVD ?No lower extremity edema ?Respiratory with no rales or wheezing ?Abdomen not distended  ?Data Reviewed: ? ? ? ?Family Communication: no family at the bedside. Patient lives alone and brother lives in [place].  ? ?Disposition: ?Status is: Inpatient ?Remains inpatient appropriate because: IV inotropic support  ? Planned Discharge Destination:  Home ? ? ? ?Author: ?Tawni Millers, MD ?02/01/2022 9:03 AM ? ?For on call review [email or web address]  ?

## 2022-02-01 NOTE — TOC CM/SW Note (Addendum)
350 pm Received call back from [employer], [name] and he will have management review for charity for Home Hospice with IV dobutamine. [name] RN3 CCM, Heart Failure TOC CM [phone]  ? ? ?HF TOC CM provided pt with number for brother listed on chart. Pt called his brother, Peyton Bottoms to give update on condition. [name] RN3 CCM, Heart Failure TOC CM [phone]  ? ?HF TOC CM received notification from [employer] and she will send email to DSS CSW to follow up on Medicaid and items needed to approve. CM spoke to pt and states he was aware he was approved for SS disability but he had a stipulation that he had to complete an immigration applications and submit with $500 fee to complete. CM reached out to HF CSW and [employer] to follow up on what the disability award stated. Pt states he does not have anyone to assist with getting documents from his apt. States his apt complex is holding his mail. Pt states he is estranged from his only brother, niece and nephew. Did not have their contact information to see if they can assist. [name] RN3 CCM, Heart Failure TOC CM [phone]  ? ?HF TOC CM sent request to [facility] AD, [name] to review for LOG payment for Home Dobutamine. Pt current does not have insurance and needs Home Dobutamine. Pt was approved for disability and would qualify for Medicaid. Contacted [facility] rep, [name] with referral for Home Dobutamine. [name] RN3 CCM, Heart Failure TOC CM [phone]  ? ? ?

## 2022-02-01 NOTE — Progress Notes (Signed)
?  Mobility Specialist Criteria Algorithm Info. ? ? 02/01/22 1030  ?Mobility  ?Activity Ambulated with assistance in hallway;Dangled on edge of bed  ?Range of Motion/Exercises Active;All extremities  ?Level of Assistance Modified independent, requires aide device or extra time  ?Assistive Device Other (Comment) ?(IV pole)  ?Distance Ambulated (ft) 500 ft  ?Activity Response Tolerated well  ? ?Patient received dangling EOB, agreeable to participate. Ambulated in hallway mod I with steady gait. Tolerated without complaint or incident. Was left dangling EOB with all needs met.  ? ? ?02/01/2022 ?12:23 PM ? ?[place] Gareth Fitzner, CMS, BS EXP ?Acute Rehabilitation Services  ?FJKNI:[phone] ?Office: [phone] ? ?

## 2022-02-01 NOTE — Plan of Care (Signed)
?  Problem: Clinical Measurements: ?Goal: Respiratory complications will improve ?Outcome: Progressing ?  ?Problem: Activity: ?Goal: Risk for activity intolerance will decrease ?Outcome: Progressing ?  ?Problem: Nutrition: ?Goal: Adequate nutrition will be maintained ?Outcome: Progressing ?  ?Problem: Coping: ?Goal: Level of anxiety will decrease ?Outcome: Progressing ?  ?

## 2022-02-01 NOTE — TOC Progression Note (Signed)
Transition of Care (TOC) - Progression Note  ? ? ?Patient Details  ?Name: Zachary Arias ?MRN: [id number] ?Date of Birth: [birth year] ? ?Transition of Care (TOC) CM/SW Contact  ?Stormy Connon, LCSW ?Phone Number: ?02/01/2022, 11:42 AM ? ?Clinical Narrative:    ?According to [facility] notes it looks like Mr. Cornforth has been deemed disabled and is awaiting his first check to come. He does qualify for Medicaid but Medicaid doesn't cover home inotropes.   ? ?TOC will continue to follow throughout discharge. ? ? ?Expected Discharge Plan: Home/Self Care ?Barriers to Discharge: Continued Medical Work up ? ?Expected Discharge Plan and Services ?Expected Discharge Plan: Home/Self Care ?  ?Discharge Planning Services: CM Consult, Medication Assistance, MATCH Program ?  ?Living arrangements for the past 2 months: Apartment ?                ?  ?  ?  ?  ?  ?  ?  ?  ?  ?  ? ? ?Social Determinants of Health (SDOH) Interventions ?Food Insecurity Interventions: Assist with SNAP Application ?Housing Interventions: Intervention Not Indicated ? ?Readmission Risk Interventions ?   ? View : No data to display.  ?  ?  ?  ? ? ?

## 2022-02-02 ENCOUNTER — Inpatient Hospital Stay: Payer: Self-pay

## 2022-02-02 LAB — COOXEMETRY PANEL
Carboxyhemoglobin: 1.5 % (ref 0.5–1.5)
Methemoglobin: <0.7 % (ref 0.0–1.5)
O2 Saturation: 52.8 %
Total hemoglobin: 14.3 g/dL (ref 12.0–16.0)

## 2022-02-02 LAB — BASIC METABOLIC PANEL
Anion gap: 9 (ref 5–15)
BUN: 28 mg/dL — ABNORMAL HIGH (ref 6–20)
CO2: 30 mmol/L (ref 22–32)
Calcium: 8.5 mg/dL — ABNORMAL LOW (ref 8.9–10.3)
Chloride: 108 mmol/L (ref 98–111)
Creatinine, Ser: 1.64 mg/dL — ABNORMAL HIGH (ref 0.61–1.24)
GFR, Estimated: 48 mL/min — ABNORMAL LOW (ref >60)
Glucose, Bld: 118 mg/dL — ABNORMAL HIGH (ref 70–99)
Potassium: 3.4 mmol/L — ABNORMAL LOW (ref 3.5–5.1)
Sodium: 147 mmol/L — ABNORMAL HIGH (ref 135–145)

## 2022-02-02 LAB — MAGNESIUM: Magnesium: 2.2 mg/dL (ref 1.7–2.4)

## 2022-02-02 MED ORDER — POTASSIUM CHLORIDE CRYS ER 20 MEQ PO TBCR
40.0000 meq | EXTENDED_RELEASE_TABLET | Freq: Once | ORAL | Status: AC
Start: 2022-02-02 — End: 2022-02-02
  Administered 2022-02-02: 40 meq via ORAL
  Filled 2022-02-02: qty 2

## 2022-02-02 MED ORDER — SPIRONOLACTONE 12.5 MG HALF TABLET
12.5000 mg | ORAL_TABLET | Freq: Every day | ORAL | Status: DC
  Administered 2022-02-02 – 2022-02-04 (×3): 12.5 mg via ORAL
  Filled 2022-02-02 (×3): qty 1

## 2022-02-02 MED ORDER — POTASSIUM CHLORIDE CRYS ER 20 MEQ PO TBCR
40.0000 meq | EXTENDED_RELEASE_TABLET | Freq: Once | ORAL | Status: AC
  Administered 2022-02-02: 40 meq via ORAL
  Filled 2022-02-02: qty 2

## 2022-02-02 MED ORDER — POTASSIUM CHLORIDE CRYS ER 20 MEQ PO TBCR
40.0000 meq | EXTENDED_RELEASE_TABLET | Freq: Two times a day (BID) | ORAL | Status: DC
  Administered 2022-02-03 – 2022-02-04 (×3): 40 meq via ORAL
  Filled 2022-02-02 (×5): qty 2

## 2022-02-02 NOTE — Progress Notes (Addendum)
? ? Advanced Heart Failure Rounding Note ? ?PCP-Cardiologist: None  ? ?Subjective:   ? ?4/24 Cardiogenic shock. Started on DBA 3 mcg.  Also started on midodrine. Diuresed 20+ lb with IV lasix.  ?4/27 DBA off ?05/03 Recurrent low-output. CO-OX 45%. DBA 2.5 restarted. ? ?Now on DBA 5. Co-ox  53% ? ?Denies SOB.  ? ? ? ?Objective:   ?Weight Range: ?67.2 kg ?Body mass index is 23.2 kg/m?.  ? ?Vital Signs:   ?Temp:  [97.6 ?F (36.4 ?C)-98.4 ?F (36.9 ?C)] 98.4 ?F (36.9 ?C) (05/09 [id number]) ?Pulse Rate:  [79-100] 79 (05/09 0806) ?Resp:  [14-20] 17 (05/09 0806) ?BP: (106-116)/(67-83) 108/67 (05/09 [id number]) ?SpO2:  [95 %-97 %] 97 % (05/09 0806) ?Weight:  [67.2 kg] 67.2 kg (05/09 0600) ?Last BM Date : 01/31/22 ? ?Weight change: ?Filed Weights  ? 01/31/22 0551 02/01/22 0604 02/02/22 0600  ?Weight: 68.3 kg 66.8 kg 67.2 kg  ? ? ?Intake/Output:  ?  ?Physical Exam  ?CVP 8-9  ?General:  No resp difficulty ?HEENT: normal ?Neck: supple. no JVD. Carotids 2+ bilat; no bruits. No lymphadenopathy or thryomegaly appreciated. ?Cor: PMI nondisplaced. Regular rate & rhythm. No rubsor murmurs. + S3  ?Lungs: clear ?Abdomen: soft, nontender, nondistended. No hepatosplenomegaly. No bruits or masses. Good bowel sounds. ?Extremities: no cyanosis, clubbing, rash, edema. RUE triple lumen picc ?Neuro: alert & orientedx3, cranial nerves grossly intact. moves all 4 extremities w/o difficulty. Affect pleasant ? ? ?Telemetry  ? ?SR -ST 90-100s ? ?Labs  ?  ?CBC ?No results for input(s): WBC, NEUTROABS, HGB, HCT, MCV, PLT in the last 72 hours. ? ?Basic Metabolic Panel ?Recent Labs  ?  02/01/22 ?0730 02/02/22 ?0600  ?NA 145 147*  ?K 3.8 3.4*  ?CL 103 108  ?CO2 30 30  ?GLUCOSE 169* 118*  ?BUN 33* 28*  ?CREATININE 1.99* 1.64*  ?CALCIUM 9.0 8.5*  ?MG 2.4 2.2  ? ?Liver Function Tests ?No results for input(s): AST, ALT, ALKPHOS, BILITOT, PROT, ALBUMIN in the last 72 hours. ? ?No results for input(s): LIPASE, AMYLASE in the last 72 hours. ?Cardiac Enzymes ?No results  for input(s): CKTOTAL, CKMB, CKMBINDEX, TROPONINI in the last 72 hours. ? ?BNP: ?BNP (last 3 results) ?Recent Labs  ?  11/19/21 ?1423 12/21/21 ?1255 01/17/22 ?1400  ?BNP 588.9* 887.4* 2,334.9*  ? ? ?ProBNP (last 3 results) ?No results for input(s): PROBNP in the last 8760 hours. ? ? ?D-Dimer ?No results for input(s): DDIMER in the last 72 hours. ?Hemoglobin A1C ?No results for input(s): HGBA1C in the last 72 hours. ? ?Fasting Lipid Panel ?No results for input(s): CHOL, HDL, LDLCALC, TRIG, CHOLHDL, LDLDIRECT in the last 72 hours. ?Thyroid Function Tests ?No results for input(s): TSH, T4TOTAL, T3FREE, THYROIDAB in the last 72 hours. ? ?Invalid input(s): FREET3 ? ?Other results: ? ? ?Imaging  ? ? ?No results found. ? ? ?Medications:   ? ? ?Scheduled Medications: ? amiodarone  200 mg Oral BID  ? aspirin  81 mg Oral Daily  ? atorvastatin  40 mg Oral Daily  ? Chlorhexidine Gluconate Cloth  6 each Topical Daily  ? dapagliflozin propanediol  10 mg Oral Daily  ? digoxin  0.0625 mg Oral Daily  ? heparin  5,000 Units Subcutaneous Q8H  ? sodium chloride flush  10-40 mL Intracatheter Q12H  ? torsemide  40 mg Oral BID  ? ? ?Infusions: ? DOBUTamine 5 mcg/kg/min (02/02/22 0256)  ? ? ? ?PRN Medications: ?acetaminophen, melatonin, sodium chloride, sodium chloride flush ? ? ? ?Patient Profile  ? ?  ?  Ms Amelung is a 61 year old with history of HF due to mixed ischemic/no-ischemic CM (diagnosed 9/10) with EF 25-30% range. Cath with 1-v CAD with chronically occluded RCA. MRI in January 2011 with EF 31% with inferior scar.  ?  ?Admitted with A/C HFrEF-->shock.  ? ?Assessment/Plan  ? ?1. A/C HFrEF --> Shock suspect cardiogenic. ?Cath with 1-v CAD with chronically occluded RCA. MRI in January 2011 with EF 31% with inferior scar.  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  EF 45-50% on echo in 8/13.  ?- No f/u between to 2015-2021 ?- Echo 6/21 EF 20-25% severe central MR. Repeat cath 6/21 with stable CAD ?-  Echo 01/05/21 EF 40-45% mild to mod MR RV mild HK  ?- Echo this admit 4/23 EF 30-35%.   ?- He had been off all meds for ~ 1 month.  ?- Started on DBA and lasix gtt w/ 20 lb diuresis.  ?- DBA off 04/27. Recurrent low-output and DBA restarted 05/03. ?- On DBA 5 mcg/kg/min Co-ox 53% ?-  Continue torsemide 40 mg twice a day   ?- Continue Farxiga 10 mg daily  ?- Continue digoxin 0.125, dig level okay 05/02 ?-Add 12.5 mg spiro daily ?- He is end-stage and inotrope dependent. Not a good candidate for advanced therapies with lack of support system and does not want this. We are in the process of trying to get him insurance coverage for  home inotropes. (See below) ?- Currently has no insurance. Medicaid pending. Discussed with [facility] rep, Carolynn Sayers. Would need Medicare or LOG from [facility] in order to qualify for home inotrope.   ?- Continue work on options home dobutamine.  ?- He will need PICC exchange to single lumen at d/c  ? ?2. AKI on CKD Stage IIIa ?- Due to cardiorenal syndrome ?- Creatinine baseline 1.4 ->admit 1.7-> today 1.6  ?-Renal US- no hydronephrosis.  ?  ?3. PVCs ?- Continue amio 200 mg twice a day.  ?- Well suppressed ? ?4. CAD  ? -Cath with 1-v CAD with chronically occluded RCA ?- HS Trop 15  ?- No chest pain  ?- Continue aspirin and atorvastatin.  ?  ?5. Hypokalemia  ? - Supp K   ?- Add spiro 12.5 mg daily  ? ?He has end stage heart failure. Difficult situation. He is not a citizen. Had a green card in the past but did not renew it. He was in the [employer] for 8 years. Not sure he has any options.  ? ?His brother is coming tomorrow to talk Dr Haroldine Laws tomorrow.  ? ?Length of Stay: 16 ? ?Darrick Grinder, NP  ?02/02/2022, 9:25 AM ? ?Advanced Heart Failure Team ?Pager [phone] (M-F; 7a - 5p)  ?Please contact [facility] for night-coverage after hours (5p -7a ) and weekends on [email or web address] ? ?Patient seen and examined with the above-signed Advanced Practice Provider and/or Housestaff. I personally  reviewed laboratory data, imaging studies and relevant notes. I independently examined the patient and formulated the important aspects of the plan. I have edited the note to reflect any of my changes or salient points. I have personally discussed the plan with the patient and/or family. ? ?Remains on dobutamine. Now inotrope dependent. Co-ox 53%. Volume status ok. Occasional SOB. No orthopnea or PND.  ? ?General:  Sitting up in bed. No resp difficulty ?HEENT: normal ?Neck: supple. no JVD. Carotids 2+ bilat; no bruits. No lymphadenopathy or thryomegaly appreciated. ?Cor: PMI nondisplaced. Regular Prominent s3 ?Lungs: clear ?Abdomen:  soft, nontender, nondistended. No hepatosplenomegaly. No bruits or masses. Good bowel sounds. ?Extremities: no cyanosis, clubbing, rash, edema ?Neuro: alert & orientedx3, cranial nerves grossly intact. moves all 4 extremities w/o difficulty. Affect pleasant ? ?He has end-stage HF. Now inotrope dependent. Ideally would need VAD but he is not a [place] citizen and has no insurance. Also has struggled with poor compliance and inadequate social support. We have been discussing home inotropes but currently no way to secure coverage. Will d/w SW and Palliative Care. ? ?Glori Bickers, MD  ?11:28 AM ? ? ? ? ? ?

## 2022-02-02 NOTE — Progress Notes (Signed)
?  Mobility Specialist Criteria Algorithm Info. ? ? ? 02/02/22 1050  ?Mobility  ?Activity Ambulated with assistance in hallway  ?Range of Motion/Exercises Active;All extremities  ?Level of Assistance Modified independent, requires aide device or extra time  ?Assistive Device Other (Comment) ?(IV Pole)  ?Distance Ambulated (ft) 500 ft  ?Activity Response Tolerated well  ? ?Patient received in supine agreeable to participate in mobility. Ambulated in hallway mod I with steady gait. Returned to room without complaint or incident. Was left dangling EOB with all needs met, call bell in reach.  ? ?02/02/2022 ?11:26 AM ? ?Zachary Arias, CMS, BS EXP ?Acute Rehabilitation Services  ?WPYKD:[phone] ?Office: [phone] ? ?

## 2022-02-02 NOTE — Progress Notes (Signed)
? ?  Order placed for PICC exchange to single lumen PICC.  ? ? ? ?Ambre Kobayashi NP-C ?4:40 PM ? ?

## 2022-02-02 NOTE — TOC CM/SW Note (Signed)
HF TOC CM received notification the letter of guarantee for payment of Home IV dobutamine was approved. TOC Director working with [employer] rep, Scientist, research (life sciences) for [employer]. Contacted [facility] rep, [name] to make aware. They will service him for Home Hospice. Attending updated. [name] RN3 CCM, Heart Failure TOC CM [phone]  ?

## 2022-02-02 NOTE — Progress Notes (Signed)
?Progress Note ? ? ?Patient: Zachary Arias [id number] DOB: [birth year] DOA: 01/17/2022     16 ?DOS: the patient was seen and examined on 02/02/2022 ?  ?Brief hospital course: ?Zachary Arias was admitted to the hospital with the working diagnosis of decompensated heart failure.  ?Now he is inotropic dependant, but having difficulty getting access to outpatient therapy due to lack of insurance.  ? ?Zachary Arias is a 61 y.o. male with a history of combined CHF, ischemic cardiomyopathy, CAD, hypertension, CKD stage IIIa, frequent PVCs on amiodarone. Patient presented secondary to shortness of breath and LE swelling.  Over the last 2 weeks prior to hospitalization he developed worsening dyspnea and lower extremity edema, refractive to increasing dose and frequency of furosemide. Positive weight gain, more than 8 lbs over last 8 weeks. On his initial physical examination his blood pressure was 125/91, HR 106, RR 16 and 02 saturation 97% on room air. Lungs with bilateral rales at bases, heart with S1 and S2 present and rhythmic, abdomen not distended, positive lower extremity edema ++.  ? ?Na 143, K 4,3 CL 110, bicarbonate 21 glucose 128, bun 19 cr 1,69 ?BNP 2,334  ?Lactic acid 1,9 and 2,7  ?Wbc 18,8, hgb 14,8 plt 262  ? ?Chest radiograph with cardiomegaly, bilateral hilar vascular congestion, positive fluid in the right fissures.  ? ?EKG 102 bpm, normal axis, normal intervals, sinus rhythm with left atrial enlargement, poor R wave progression, no significant ST segment or T wave changes.  ? ?Patient was placed on furosemide for diuresis, developed low output failure (cardiogenic shock), requiring inotropic support.  ? ?While admitted, patient expressed concern for right ankle pain. Found to have probably tendon tear. ? ?Volume status has improved and now transitioned to oral diuretic therapy.  ?Not able to wean off inotropic support.  ?Plan for possible dc home with inotropic infusion, but lack of insurance has been limiting  access to outpatient therapy.  ? ?Assessment and Plan: ?Congestive heart failure ([place]) ?Acute on chronic systolic heart failure.  ?Pulmonary hypertension acute on chronic core pulmonale.  ? ?Echocardiogram with reduced LV systolic function with EF 30 to 35%. Severe dilatation of internal cavity. Mild enlargement of RV. Severe elevation in pulmonary artery systolic pressure, RVSP 64 mmHg. Severe dilatation of left atrium. Posterior trivial pericardial effusion. Moderate TR.  ? ?Urine output over last 24 hrs is 3,700 ml. ?Systolic blood pressure [id number] to 100 mmHg range.  ?SV02 52.8 ? ?On dobutamine infusion at 5 mcg/kg/min ?Metolazone 2,5 mg 05/05.  ?Continue with torsemide 40 mg bid  ?On dapagliflozin and digoxin  ?Today added spironolactone.  ? ?PVC, and ectopy continue with amiodarone with good toleration.   ? ?Patient with end-stage HF. Not able to get VAD due to lack of insurance and not a [place] citizen.  ?Follow up with palliative care.  ? ?Acute kidney injury superimposed on chronic kidney disease ([place]) ?CKd stage 3a. Hypokalemia, hypernatremia.  ? ?Patient tolerating well inotropic support and diuresis ?Follow up renal function today with serum cr at 1,64 with K at 4,7 and serum bicarbonate at 30. ? ?Continue diuresis with torsemide and today added spironolactone.  ?Continue K supplementation with KCl 40 meq bid.  ?Follow up renal function and electrolytes in am. ?Avoid hypotension and nephrotoxic medications.  ? ?Right ankle pain ?Reactive leukocytosis.  ?Pain did not respond to prednisone.  ?Plan for outpatient MRI follow up with orthopedics.  ? ?Dyslipidemia ?Continue with statin therapy.  ? ? ? ? ?  ? ?Subjective: Patient  is feeling better, no chest pain or dyspnea.  ? ?Physical Exam: ?Vitals:  ? 02/02/22 0331 02/02/22 0600 02/02/22 0806 02/02/22 1114  ?BP: 106/71  108/67 100/78  ?Pulse: 80  79 92  ?Resp: 18  17 18   ?Temp: 97.6 ?F (36.4 ?C)  98.4 ?F (36.9 ?C) 97.7 ?F (36.5 ?C)  ?TempSrc: Oral  Oral Oral  ?SpO2:  96%  97% 95%  ?Weight:  67.2 kg    ?Height:      ? ?Neurology awake and alert ?ENT with no pallor ?Cardiovascular with S1 and S2 present with no gallops, or rubs, positive murmur at the apex ?No JVD ?No lower extremity edema ?Respiratory with no rales or wheezing ?Abdomen not distended ? ?Data Reviewed: ? ? ? ?Family Communication: no family at the bedside  ? ?Disposition: ?Status is: Inpatient ?Remains inpatient appropriate because: heart failure IV inotropic dependent  ? Planned Discharge Destination: Home ? ? ? ? ? ?Author: ?Tawni Millers, MD ?02/02/2022 12:56 PM ? ?For on call review [email or web address].  ?

## 2022-02-02 NOTE — TOC CM/SW Note (Addendum)
HF TOC CM spoke with [facility] Coordinator, [name] RN and she will speak to pt today about Home Dobutamine. Pt states his brother is scheduled to visit on tomorrow but will call him to confirm. [name] RN3 CCM, Heart Failure TOC CM [phone]  ? ?HF TOC CM received update on referral from [facility] rep, [name], [facility] will accept referral for Home Hospice but cannot support the IV Dobutamine in the home due to medication cost. HF TOC CM contacted TOC AD, [name] to make aware. TOC AD will discuss with [facility] about LOG for IV Home Dobutamine. Pt is not a [place] Citizen and was working with immigration to renew his green card. His SS disability is suspended at this time. [name] RN3 CCM, Heart Failure TOC CM [phone]  ? ?Message received from [facility] rep, [name]- This is the response I just received from the [facility] caseworker "It looks like his disability has been suspended since 07/2020 and has a nonpayment status". She has access to limited information in the [facility] system.  Basically, she can verify active eligibility and monthly payments. I will keep you updated with additional information as I have it. ? ? ?[name] RN3 CCM, Heart Failure TOC CM [phone]  ?

## 2022-02-03 ENCOUNTER — Encounter (HOSPITAL_COMMUNITY): Payer: Self-pay

## 2022-02-03 ENCOUNTER — Institutional Professional Consult (permissible substitution): Payer: Self-pay | Admitting: Student

## 2022-02-03 LAB — CBC
HCT: 45.6 % (ref 39.0–52.0)
Hemoglobin: 14.2 g/dL (ref 13.0–17.0)
MCH: 27.7 pg (ref 26.0–34.0)
MCHC: 31.1 g/dL (ref 30.0–36.0)
MCV: 89.1 fL (ref 80.0–100.0)
Platelets: 415 10*3/uL — ABNORMAL HIGH (ref 150–400)
RBC: 5.12 MIL/uL (ref 4.22–5.81)
RDW: 15.9 % — ABNORMAL HIGH (ref 11.5–15.5)
WBC: 10.7 10*3/uL — ABNORMAL HIGH (ref 4.0–10.5)
nRBC: 0 % (ref 0.0–0.2)

## 2022-02-03 LAB — BASIC METABOLIC PANEL
Anion gap: 7 (ref 5–15)
BUN: 30 mg/dL — ABNORMAL HIGH (ref 6–20)
CO2: 32 mmol/L (ref 22–32)
Calcium: 9 mg/dL (ref 8.9–10.3)
Chloride: 107 mmol/L (ref 98–111)
Creatinine, Ser: 1.72 mg/dL — ABNORMAL HIGH (ref 0.61–1.24)
GFR, Estimated: 45 mL/min — ABNORMAL LOW (ref >60)
Glucose, Bld: 130 mg/dL — ABNORMAL HIGH (ref 70–99)
Potassium: 4.1 mmol/L (ref 3.5–5.1)
Sodium: 146 mmol/L — ABNORMAL HIGH (ref 135–145)

## 2022-02-03 LAB — COOXEMETRY PANEL
Carboxyhemoglobin: 0.6 % (ref 0.5–1.5)
Carboxyhemoglobin: 1 % (ref 0.5–1.5)
Methemoglobin: <0.7 % (ref 0.0–1.5)
Methemoglobin: <0.7 % (ref 0.0–1.5)
O2 Saturation: 45.2 %
O2 Saturation: 53.7 %
Total hemoglobin: 14.1 g/dL (ref 12.0–16.0)
Total hemoglobin: 14.7 g/dL (ref 12.0–16.0)

## 2022-02-03 LAB — MAGNESIUM: Magnesium: 2.5 mg/dL — ABNORMAL HIGH (ref 1.7–2.4)

## 2022-02-03 NOTE — Progress Notes (Signed)
?PROGRESS NOTE ? ? ? ?Zachary Arias  [id number] DOB: 1960-12-30 DOA: 01/17/2022 ?PCP: Default, Provider, MD  ?60/M with history of chronic combined CHF, EF 30%, severe pulmonary hypertension, moderate TR, CKD 3a, CAD was admitted 4/23 with cardiogenic shock ?-Diuresed well on Lasix gtt. and dobutamine, developed recurrent low output state once dobutamine was discontinued, restarted again 5/3. ?-Subsequently remains inotrope dependent, social work following for pending Medicaid ? ? ?Subjective: ?Feels okay, no complaints ? ?Assessment and Plan: ? ?Acute systolic CHF  ?Cardiogenic shock  ?Moderate to severe pulmonary hypertension, moderate MR, TR  ?-Echo  with reduced LV systolic function with EF 30 to 35%. Severe elevation in pulmonary artery systolic pressure, RVSP 64 mmHg.  ?-Diuresed aggressively on dobutamine and Lasix gtt., 2.5 L negative, weight down 25lbs ?-Had recurrent low output state once dobutamine discontinued, now inotrope dependent, considered end-stage, situation complicated by lack of support system, uninsured status etc. social work following, Medicaid pending ?-Continue torsemide, Farxiga, digoxin and Aldactone ?-Per heart failure team ? ?Acute kidney injury superimposed on chronic kidney disease ([place]) ?CKd stage 3a. Hypokalemia, hypernatremia.  ?-Cardiorenal syndrome, baseline creatinine is 1.4, now stable in the 1.6, 1.7 range ? ?Right ankle pain ?-Per Dr. Nolon Lennert note this did not respond to prednisone ?-Supportive care, monitor ?-Check uric acid level ? ?Dyslipidemia ?Continue with statin therapy.  ? ?CAD ?-Cath with one-vessel CAD, chronically occluded RCA ?-Stable, continue aspirin and statin ? ?PVCs ?-Remains on amiodarone ? ?DVT prophylaxis: Heparin subcutaneous ?Code Status: Full code ?Family Communication: No family at bedside ?Disposition Plan: To be determined ? ?Consultants:  ?Heart failure team ? ?Procedures:  ? ?Antimicrobials:  ? ? ?Objective: ?Vitals:  ? 02/02/22 2032 02/02/22  2324 02/03/22 0358 02/03/22 0744  ?BP:  109/73 99/75 110/72  ?Pulse:  79 89   ?Resp: 18 19 15 17   ?Temp:  97.6 ?F (36.4 ?C) 97.6 ?F (36.4 ?C)   ?TempSrc:  Oral Oral   ?SpO2:  96% 96%   ?Weight:   67.2 kg   ?Height:      ? ? ?Intake/Output Summary (Last 24 hours) at 02/03/2022 1050 ?Last data filed at 02/03/2022 0744 ?Gross per 24 hour  ?Intake 411 ml  ?Output 2275 ml  ?Net -1864 ml  ? ?Filed Weights  ? 02/01/22 0604 02/02/22 0600 02/03/22 0358  ?Weight: 66.8 kg 67.2 kg 67.2 kg  ? ? ?Examination: ? ?General exam: Appears calm and comfortable  ?Respiratory system: Clear to auscultation ?Cardiovascular system: S1 & S2 heard, RRR.  ?Abd: nondistended, soft and nontender.Normal bowel sounds heard. ?Central nervous system: Alert and oriented. No focal neurological deficits. ?Extremities: no edema, right upper extremity PICC noted ?Skin: No rashes ?Psychiatry:  Mood & affect appropriate.  ? ? ? ?Data Reviewed:  ? ?CBC: ?Recent Labs  ?Lab 02/03/22 ?[id number]  ?WBC 10.7*  ?HGB 14.2  ?HCT 45.6  ?MCV 89.1  ?PLT 415*  ? ?Basic Metabolic Panel: ?Recent Labs  ?Lab 01/30/22 ?0445 01/31/22 ?0550 02/01/22 ?0730 02/02/22 ?0600 02/03/22 ?[id number]  ?NA 140 145 145 147* 146*  ?K 3.3* 3.4* 3.8 3.4* 4.1  ?CL 104 108 103 108 107  ?CO2 26 27 30 30  32  ?GLUCOSE 144* 128* 169* 118* 130*  ?BUN 36* 33* 33* 28* 30*  ?CREATININE 1.78* 1.60* 1.99* 1.64* 1.72*  ?CALCIUM 9.0 8.7* 9.0 8.5* 9.0  ?MG 2.3 2.3 2.4 2.2 2.5*  ? ?GFR: ?Estimated Creatinine Clearance: 43.2 mL/min (A) (by C-G formula based on SCr of 1.72 mg/dL (H)). ?Liver Function Tests: ?No results for  input(s): AST, ALT, ALKPHOS, BILITOT, PROT, ALBUMIN in the last 168 hours. ?No results for input(s): LIPASE, AMYLASE in the last 168 hours. ?No results for input(s): AMMONIA in the last 168 hours. ?Coagulation Profile: ?No results for input(s): INR, PROTIME in the last 168 hours. ?Cardiac Enzymes: ?No results for input(s): CKTOTAL, CKMB, CKMBINDEX, TROPONINI in the last 168 hours. ?BNP (last 3  results) ?No results for input(s): PROBNP in the last 8760 hours. ?HbA1C: ?No results for input(s): HGBA1C in the last 72 hours. ?CBG: ?No results for input(s): GLUCAP in the last 168 hours. ?Lipid Profile: ?No results for input(s): CHOL, HDL, LDLCALC, TRIG, CHOLHDL, LDLDIRECT in the last 72 hours. ?Thyroid Function Tests: ?No results for input(s): TSH, T4TOTAL, FREET4, T3FREE, THYROIDAB in the last 72 hours. ?Anemia Panel: ?No results for input(s): VITAMINB12, FOLATE, FERRITIN, TIBC, IRON, RETICCTPCT in the last 72 hours. ?Urine analysis: ?   ?Component Value Date/Time  ? COLORURINE YELLOW 03/05/2020 0557  ? APPEARANCEUR CLEAR 03/05/2020 0557  ? LABSPEC 1.025 03/05/2020 0557  ? PHURINE 5.5 03/05/2020 0557  ? GLUCOSEU NEGATIVE 03/05/2020 0557  ? HGBUR TRACE (A) 03/05/2020 0557  ? [place] NEGATIVE 03/05/2020 0557  ? [place] NEGATIVE 03/05/2020 0557  ? PROTEINUR 100 (A) 03/05/2020 0557  ? UROBILINOGEN 0.2 06/24/2009 1116  ? NITRITE NEGATIVE 03/05/2020 0557  ? LEUKOCYTESUR NEGATIVE 03/05/2020 0557  ? ?Sepsis Labs: ?@LABRCNTIP (procalcitonin:4,lacticidven:4) ? ?)No results found for this or any previous visit (from the past 240 hour(s)).  ? ?Radiology Studies: ?[place] EKG Site Rite ? ?Result Date: 02/02/2022 ?If [employer] not attached, placement could not be confirmed due to current cardiac rhythm.  ? ? ?Scheduled Meds: ? amiodarone  200 mg Oral BID  ? aspirin  81 mg Oral Daily  ? atorvastatin  40 mg Oral Daily  ? Chlorhexidine Gluconate Cloth  6 each Topical Daily  ? dapagliflozin propanediol  10 mg Oral Daily  ? digoxin  0.0625 mg Oral Daily  ? heparin  5,000 Units Subcutaneous Q8H  ? potassium chloride  40 mEq Oral BID  ? sodium chloride flush  10-40 mL Intracatheter Q12H  ? spironolactone  12.5 mg Oral Daily  ? torsemide  40 mg Oral BID  ? ?Continuous Infusions: ? DOBUTamine 5 mcg/kg/min (02/02/22 0256)  ? ? ? LOS: 17 days  ? ? ?Time spent: 47min ? ?Domenic Polite, MD ?[facility] ? ? ?02/03/2022, 10:50  AM  ?  ?

## 2022-02-03 NOTE — Plan of Care (Signed)

## 2022-02-03 NOTE — Plan of Care (Signed)
  Problem: Education: Goal: Knowledge of General Education information will improve Description Including pain rating scale, medication(s)/side effects and non-pharmacologic comfort measures Outcome: Progressing   Problem: Health Behavior/Discharge Planning: Goal: Ability to manage health-related needs will improve Outcome: Progressing   

## 2022-02-03 NOTE — Progress Notes (Addendum)
? ? Advanced Heart Failure Rounding Note ? ?PCP-Cardiologist: Dr. Haroldine Laws  ? ?Subjective:   ? ?4/24 Cardiogenic shock. Started on DBA 3 mcg.  Also started on midodrine. Diuresed 20+ lb with IV lasix.  ?4/27 DBA off ?05/03 Recurrent low-output. CO-OX 45%. DBA 2.5 restarted. ? ? ?Remains inotrope dependent. On DBA 5. Co-ox  45%>>54% ? ?SCr 1.99>>1.64>>1.72  ? ?CVP 8-9. Wt stable 148 lb.  ? ?Breathing improved, ambulating w/o exertional dyspnea.  ? ?Has been approved for home DBA coverage for 1 month.  ? ? ? ?Objective:   ?Weight Range: ?67.2 kg ?Body mass index is 23.2 kg/m?.  ? ?Vital Signs:   ?Temp:  [97.4 ?F (36.3 ?C)-97.9 ?F (36.6 ?C)] 97.8 ?F (36.6 ?C) (05/10 1945) ?Pulse Rate:  [id number] (05/10 1945) ?Resp:  [14-19] 14 (05/10 1945) ?BP: (99-110)/(69-89) 106/69 (05/10 1945) ?SpO2:  [95 %-96 %] 96 % (05/10 1945) ?Weight:  [67.2 kg] 67.2 kg (05/10 0358) ?Last BM Date : 02/03/22 ? ?Weight change: ?Filed Weights  ? 02/01/22 0604 02/02/22 0600 02/03/22 0358  ?Weight: 66.8 kg 67.2 kg 67.2 kg  ? ? ?Intake/Output:  ?  ?Physical Exam  ? ?CVP 8-9 ?General:  Well appearing. No respiratory difficulty ?HEENT: normal ?Neck: supple. no JVD. Carotids 2+ bilat; no bruits.  ?Cor: PMI nondisplaced. Regular rate & rhythm. No rubs, gallops or murmurs. ?Lungs: clear ?Abdomen: soft, nontender, nondistended. No hepatosplenomegaly.  ?Extremities: no cyanosis, clubbing, rash, edema + TEDs + RUE single lumen picc  ?Neuro: alert & oriented x 3, cranial nerves grossly intact. moves all 4 extremities w/o difficulty. Affect pleasant. ? ? ?Telemetry  ? ?NSR 90s  ? ?Labs  ?  ?CBC ?Recent Labs  ?  02/03/22 ?[id number]  ?WBC 10.7*  ?HGB 14.2  ?HCT 45.6  ?MCV 89.1  ?PLT 415*  ? ? ?Basic Metabolic Panel ?Recent Labs  ?  02/02/22 ?0600 02/03/22 ?[id number]  ?NA 147* 146*  ?K 3.4* 4.1  ?CL 108 107  ?CO2 30 32  ?GLUCOSE 118* 130*  ?BUN 28* 30*  ?CREATININE 1.64* 1.72*  ?CALCIUM 8.5* 9.0  ?MG 2.2 2.5*  ? ?Liver Function Tests ?No results for input(s): AST,  ALT, ALKPHOS, BILITOT, PROT, ALBUMIN in the last 72 hours. ? ?No results for input(s): LIPASE, AMYLASE in the last 72 hours. ?Cardiac Enzymes ?No results for input(s): CKTOTAL, CKMB, CKMBINDEX, TROPONINI in the last 72 hours. ? ?BNP: ?BNP (last 3 results) ?Recent Labs  ?  11/19/21 ?1423 12/21/21 ?1255 01/17/22 ?1400  ?BNP 588.9* 887.4* 2,334.9*  ? ? ?ProBNP (last 3 results) ?No results for input(s): PROBNP in the last 8760 hours. ? ? ?D-Dimer ?No results for input(s): DDIMER in the last 72 hours. ?Hemoglobin A1C ?No results for input(s): HGBA1C in the last 72 hours. ? ?Fasting Lipid Panel ?No results for input(s): CHOL, HDL, LDLCALC, TRIG, CHOLHDL, LDLDIRECT in the last 72 hours. ?Thyroid Function Tests ?No results for input(s): TSH, T4TOTAL, T3FREE, THYROIDAB in the last 72 hours. ? ?Invalid input(s): FREET3 ? ?Other results: ? ? ?Imaging  ? ? ?No results found. ? ? ?Medications:   ? ? ?Scheduled Medications: ? amiodarone  200 mg Oral BID  ? aspirin  81 mg Oral Daily  ? atorvastatin  40 mg Oral Daily  ? Chlorhexidine Gluconate Cloth  6 each Topical Daily  ? dapagliflozin propanediol  10 mg Oral Daily  ? digoxin  0.0625 mg Oral Daily  ? heparin  5,000 Units Subcutaneous Q8H  ? potassium chloride  40 mEq Oral BID  ?  sodium chloride flush  10-40 mL Intracatheter Q12H  ? spironolactone  12.5 mg Oral Daily  ? torsemide  40 mg Oral BID  ? ? ?Infusions: ? DOBUTamine 5 mcg/kg/min (02/03/22 1800)  ? ? ? ?PRN Medications: ?acetaminophen, melatonin, sodium chloride, sodium chloride flush ? ? ? ?Patient Profile  ? ?  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 Cath with 1-v CAD with chronically occluded RCA. MRI in January 2011 with EF 31% with inferior scar.  ?  ?Admitted with A/C HFrEF-->shock.  ? ?Assessment/Plan  ? ?1. A/C HFrEF --> Shock suspect cardiogenic. ?Cath with 1-v CAD with chronically occluded RCA. MRI in January 2011 with EF 31% with  inferior scar.  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  EF 45-50% on echo in 8/13.  ?- No f/u between to 2015-2021 ?- Echo 6/21 EF 20-25% severe central MR. Repeat cath 6/21 with stable CAD ?- Echo 01/05/21 EF 40-45% mild to mod MR RV mild HK  ?- Echo this admit 4/23 EF 30-35%.   ?- He had been off all meds for ~ 1 month.  ?- Started on DBA and lasix gtt w/ 20 lb diuresis.  ?- DBA off 04/27. Recurrent low-output and DBA restarted 05/03. ?- On DBA 5 mcg/kg/min Co-ox 45%>54% ?- CVP 8-9. Continue torsemide 40 mg twice a day   ?- Continue Farxiga 10 mg daily  ?- Continue digoxin 0.0625, dig level okay 05/02 ?- Continue 12.5 mg spiro daily ?- He is end-stage and inotrope dependent. Not a good candidate for advanced therapies with lack of support system and does not want this  ?- Currently has no insurance. Medicaid pending. Received notification the letter of guarantee for payment of Home IV dobutamine was approved x 1 month.  ?- Plan home w/ DBA support   ?- PICC exchanged to single lumen ? ?2. AKI on CKD Stage IIIa ?- Due to cardiorenal syndrome ?- Creatinine baseline 1.4 ->admit 1.7-> today  ?- Renal US- no hydronephrosis.  ?  ?3. PVCs ?- Continue amio 200 mg twice a day.  ?- Well suppressed ? ?4. CAD  ? -Cath with 1-v CAD with chronically occluded RCA ?- HS Trop 15  ?- No chest pain  ?- Continue aspirin and atorvastatin.  ?  ?5. Hypokalemia  ?- resolved, K 4.1 today  ?- Continue spiro 12.5 mg daily  ? ? ?Potentially able to go home tomorrow if able to get home inotrope delivered by then. He will be enrolled in [facility].  ? ? ?Length of Stay: 17 ? ?FINCH, LINDSAY N, PA-C  ?02/03/2022, 8:49 PM ? ?Advanced Heart Failure Team ?Pager [phone] (M-F; 7a - 5p)  ?Please contact [facility] for night-coverage after hours (5p -7a ) and weekends on [email or web address] ? ?Patient seen and examined with the above-signed Advanced Practice Provider and/or Housestaff. I personally reviewed laboratory  data, imaging studies and relevant notes. I independently examined the patient and formulated the important aspects of the plan. I have edited the note to reflect any of my changes or salient points. I have personally discussed the plan with the patient and/or family. ? ?Remains on dobutamine. CVP 8-9 Still SOB with mild exertion. No orthopnea or PND ? ?General:  Lying in bed . No resp difficulty ?HEENT: normal ?Neck: supple. no JVD. Carotids 2+ bilat; no bruits. No lymphadenopathy or thryomegaly appreciated. ?Cor:. Regular  tachy prominent s3. 2/6 TR ?Lungs: clear ?  Abdomen: soft, nontender, nondistended. No hepatosplenomegaly. No bruits or masses. Good bowel sounds. ?Extremities: no cyanosis, clubbing, rash, edema ?Neuro: alert & orientedx3, cranial nerves grossly intact. moves all 4 extremities w/o difficulty. Affect pleasant ? ?He has end-stage HF. He does not have citizenship or insurance. [facility] has agreed to pay for 1 month of palliative DBA. Will arrange for him to go home tomorrow hopefully. He understands that his options are extremely limited. ? ?Zachary Bickers, MD  ?11:17 PM ? ? ? ? ? ?

## 2022-02-03 NOTE — Progress Notes (Signed)
?  Mobility Specialist Criteria Algorithm Info. ? ? 02/03/22 1050  ?Mobility  ?Activity Ambulated independently in hallway  ?Range of Motion/Exercises Active;All extremities  ?Level of Assistance Modified independent, requires aide device or extra time  ?Assistive Device Other (Comment) ?(IV pole)  ?Distance Ambulated (ft) 500 ft  ?Activity Response Tolerated well  ? ?Patient received in bed agreeable to participate in mobility. Ambulated in hallway mod I with steady gait. Returned to room without complaint or incident. Was left dangling EOB with all needs met, call bell in reach.  ? ?02/03/2022 ?3:44 PM ? ?[place] Montie Gelardi, CMS, BS EXP ?Acute Rehabilitation Services  ?GQHQI:[phone] ?Office: [phone] ? ?

## 2022-02-04 ENCOUNTER — Other Ambulatory Visit (HOSPITAL_COMMUNITY): Payer: Self-pay

## 2022-02-04 LAB — BASIC METABOLIC PANEL
Anion gap: 9 (ref 5–15)
BUN: 28 mg/dL — ABNORMAL HIGH (ref 6–20)
CO2: 27 mmol/L (ref 22–32)
Calcium: 8.8 mg/dL — ABNORMAL LOW (ref 8.9–10.3)
Chloride: 103 mmol/L (ref 98–111)
Creatinine, Ser: 1.66 mg/dL — ABNORMAL HIGH (ref 0.61–1.24)
GFR, Estimated: 47 mL/min — ABNORMAL LOW (ref >60)
Glucose, Bld: 208 mg/dL — ABNORMAL HIGH (ref 70–99)
Potassium: 4 mmol/L (ref 3.5–5.1)
Sodium: 139 mmol/L (ref 135–145)

## 2022-02-04 LAB — COOXEMETRY PANEL
Carboxyhemoglobin: 1.4 % (ref 0.5–1.5)
Methemoglobin: <0.7 % (ref 0.0–1.5)
O2 Saturation: 57.7 %
Total hemoglobin: 14.2 g/dL (ref 12.0–16.0)

## 2022-02-04 LAB — MAGNESIUM: Magnesium: 2.4 mg/dL (ref 1.7–2.4)

## 2022-02-04 LAB — CBC
HCT: 43.3 % (ref 39.0–52.0)
Hemoglobin: 13.9 g/dL (ref 13.0–17.0)
MCH: 28.1 pg (ref 26.0–34.0)
MCHC: 32.1 g/dL (ref 30.0–36.0)
MCV: 87.7 fL (ref 80.0–100.0)
Platelets: 380 10*3/uL (ref 150–400)
RBC: 4.94 MIL/uL (ref 4.22–5.81)
RDW: 15.7 % — ABNORMAL HIGH (ref 11.5–15.5)
WBC: 11.1 10*3/uL — ABNORMAL HIGH (ref 4.0–10.5)
nRBC: 0 % (ref 0.0–0.2)

## 2022-02-04 LAB — URIC ACID: Uric Acid, Serum: 2 mg/dL — ABNORMAL LOW (ref 3.7–8.6)

## 2022-02-04 MED ORDER — POTASSIUM CHLORIDE CRYS ER 20 MEQ PO TBCR
40.0000 meq | EXTENDED_RELEASE_TABLET | Freq: Two times a day (BID) | ORAL | 11 refills | Status: DC
  Filled 2022-02-04: qty 120, 30d supply, fill #0

## 2022-02-04 MED ORDER — DOBUTAMINE IN D5W 4-5 MG/ML-% IV SOLN
5.0000 ug/kg/min | INTRAVENOUS | Status: DC
Start: 2022-02-04 — End: 2022-04-20

## 2022-02-04 MED ORDER — TORSEMIDE 20 MG PO TABS
40.0000 mg | ORAL_TABLET | Freq: Two times a day (BID) | ORAL | 5 refills | Status: DC
Start: 2022-02-04 — End: 2022-03-08
  Filled 2022-02-04 – 2022-02-26 (×2): qty 120, 30d supply, fill #0

## 2022-02-04 MED ORDER — SPIRONOLACTONE 25 MG PO TABS
12.5000 mg | ORAL_TABLET | Freq: Every day | ORAL | 3 refills | Status: DC
  Filled 2022-02-04 – 2022-02-26 (×2): qty 15, 30d supply, fill #0

## 2022-02-04 NOTE — TOC CM/SW Note (Addendum)
357 pm Spoke to [facility] rep, Dawn to make aware of dc home today. Will have Home Hospice RN do admission tomorrow. [name] RN3 CCM, Heart Failure TOC CM [phone]  ? ?2:00 pm TOC AD approved taxi voucher for pt to [employer]. [name] RN3 CCM, Heart Failure TOC CM [phone]  ? ?HF TOC CM received notification that pt was approved for letter of guarantee for Home IV Dobutamine with [facility]. Spoke to rep, [name] and they will send cost and contract information. [name] RN3 CCM, Heart Failure TOC CM [phone]  ?

## 2022-02-04 NOTE — Progress Notes (Addendum)
? ? Advanced Heart Failure Rounding Note ? ?PCP-Cardiologist: Dr. Haroldine Laws  ? ?Subjective:   ? ?4/24 Cardiogenic shock. Started on DBA 3 mcg.  Also started on midodrine. Diuresed 20+ lb with IV lasix.  ?4/27 DBA off ?05/03 Recurrent low-output. CO-OX 45%. DBA 2.5 restarted. ? ? ?Remains inotrope dependent. On DBA 5. Co-ox  58% ? ?SCr 1.99>>1.64>>1.72 >>1.66 today  ? ?1.9L in UOP yesterday. CVP 6 ? ?Feels better. No current dyspnea. Sitting up on side of bed eating breakfast.  ? ? ? ? ?Objective:   ?Weight Range: ?68.9 kg ?Body mass index is 23.79 kg/m?.  ? ?Vital Signs:   ?Temp:  [97.4 ?F (36.3 ?C)-98.3 ?F (36.8 ?C)] 97.4 ?F (36.3 ?C) (05/11 [id number]) ?Pulse Rate:  [74-85] 85 (05/11 0406) ?Resp:  [14-20] 17 (05/11 0724) ?BP: (95-118)/(69-89) 106/81 (05/11 0724) ?SpO2:  [94 %-96 %] 96 % (05/11 0406) ?Weight:  [68.9 kg] 68.9 kg (05/11 0406) ?Last BM Date : 02/03/22 ? ?Weight change: ?Filed Weights  ? 02/02/22 0600 02/03/22 0358 02/04/22 0406  ?Weight: 67.2 kg 67.2 kg 68.9 kg  ? ? ?Intake/Output:  ?  ?Physical Exam  ? ?CVP 6  ?General:  Well appearing. No respiratory difficulty ?HEENT: normal ?Neck: supple. JVD 6 cm. Carotids 2+ bilat; no bruits. No lymphadenopathy or thyromegaly appreciated. ?Cor: PMI nondisplaced. Regular rhythm tachy rate. No rubs, gallops or murmurs. ?Lungs: clear ?Abdomen: soft, nontender, nondistended. No hepatosplenomegaly. No bruits or masses. Good bowel sounds. ?Extremities: no cyanosis, clubbing, rash, edema + RUE PICC  ?Neuro: alert & oriented x 3, cranial nerves grossly intact. moves all 4 extremities w/o difficulty. Affect pleasant. ? ?Telemetry  ? ?Sinus tach low 100s  ? ?Labs  ?  ?CBC ?Recent Labs  ?  02/03/22 ?[id number] 02/04/22 ?0500  ?WBC 10.7* 11.1*  ?HGB 14.2 13.9  ?HCT 45.6 43.3  ?MCV 89.1 87.7  ?PLT 415* 380  ? ? ?Basic Metabolic Panel ?Recent Labs  ?  02/03/22 ?[id number] 02/04/22 ?0500  ?NA 146* 139  ?K 4.1 4.0  ?CL 107 103  ?CO2 32 27  ?GLUCOSE 130* 208*  ?BUN 30* 28*  ?CREATININE 1.72*  1.66*  ?CALCIUM 9.0 8.8*  ?MG 2.5* 2.4  ? ?Liver Function Tests ?No results for input(s): AST, ALT, ALKPHOS, BILITOT, PROT, ALBUMIN in the last 72 hours. ? ?No results for input(s): LIPASE, AMYLASE in the last 72 hours. ?Cardiac Enzymes ?No results for input(s): CKTOTAL, CKMB, CKMBINDEX, TROPONINI in the last 72 hours. ? ?BNP: ?BNP (last 3 results) ?Recent Labs  ?  11/19/21 ?1423 12/21/21 ?1255 01/17/22 ?1400  ?BNP 588.9* 887.4* 2,334.9*  ? ? ?ProBNP (last 3 results) ?No results for input(s): PROBNP in the last 8760 hours. ? ? ?D-Dimer ?No results for input(s): DDIMER in the last 72 hours. ?Hemoglobin A1C ?No results for input(s): HGBA1C in the last 72 hours. ? ?Fasting Lipid Panel ?No results for input(s): CHOL, HDL, LDLCALC, TRIG, CHOLHDL, LDLDIRECT in the last 72 hours. ?Thyroid Function Tests ?No results for input(s): TSH, T4TOTAL, T3FREE, THYROIDAB in the last 72 hours. ? ?Invalid input(s): FREET3 ? ?Other results: ? ? ?Imaging  ? ? ?No results found. ? ? ?Medications:   ? ? ?Scheduled Medications: ? amiodarone  200 mg Oral BID  ? aspirin  81 mg Oral Daily  ? atorvastatin  40 mg Oral Daily  ? Chlorhexidine Gluconate Cloth  6 each Topical Daily  ? dapagliflozin propanediol  10 mg Oral Daily  ? digoxin  0.0625 mg Oral Daily  ? heparin  5,000 Units Subcutaneous Q8H  ? potassium chloride  40 mEq Oral BID  ? sodium chloride flush  10-40 mL Intracatheter Q12H  ? spironolactone  12.5 mg Oral Daily  ? torsemide  40 mg Oral BID  ? ? ?Infusions: ? DOBUTamine 5 mcg/kg/min (02/04/22 [id number])  ? ? ? ?PRN Medications: ?acetaminophen, melatonin, sodium chloride, sodium chloride flush ? ? ? ?Patient Profile  ? ?  ?Ms Wark is a 61 year old with history of HF due to mixed ischemic/no-ischemic CM (diagnosed 9/10) with EF 25-30% range. Cath with 1-v CAD with chronically occluded RCA. MRI in January 2011 with EF 31% with inferior scar.  ?  ?Admitted with A/C HFrEF-->shock.  ? ?Assessment/Plan  ? ?1. A/C HFrEF --> Shock suspect  cardiogenic. ?Cath with 1-v CAD with chronically occluded RCA. MRI in January 2011 with EF 31% with inferior scar.  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  EF 45-50% on echo in 8/13.  ?- No f/u between to 2015-2021 ?- Echo 6/21 EF 20-25% severe central MR. Repeat cath 6/21 with stable CAD ?- Echo 01/05/21 EF 40-45% mild to mod MR RV mild HK  ?- Echo this admit 4/23 EF 30-35%.   ?- He had been off all meds for ~ 1 month.  ?- Started on DBA and lasix gtt w/ 20 lb diuresis.  ?- DBA off 04/27. Recurrent low-output and DBA restarted 05/03. ?- On DBA 5 mcg/kg/min Co-ox 45%>54%>58%  ?- CVP 6. Continue torsemide 40 mg twice a day   ?- Continue Farxiga 10 mg daily  ?- Continue digoxin 0.0625, dig level okay 05/02 ?- Continue 12.5 mg spiro daily ?- He is end-stage and inotrope dependent. Not a good candidate for advanced therapies with lack of support system and does not want this  ?- Currently has no insurance. Medicaid pending. Received notification the letter of guarantee for payment of palliative home IV dobutamine was approved x 1 month. He understands that his options are extremely limited. ?- Plan home w/ DBA support   ?- PICC exchanged to single lumen ? ?2. AKI on CKD Stage IIIa ?- Due to cardiorenal syndrome ?- Creatinine baseline 1.4 ->admit 1.6 today  ?- Renal US- no hydronephrosis.  ?  ?3. PVCs ?- Continue amio 200 mg twice a day.  ?- Well suppressed ? ?4. CAD  ? -Cath with 1-v CAD with chronically occluded RCA ?- HS Trop 15  ?- No chest pain  ?- Continue aspirin and atorvastatin.  ?  ?5. Hypokalemia  ?- resolved, K 4.0 today  ?- Continue spiro 12.5 mg daily  ? ? ?Plan d/c home today if able to get home inotrope delivered. He will be enrolled in [facility]. Will d/c [facility].  ? ?Cardiac Meds for Discharge ? ?Amiodarone 200 mg bid ?ASA 81 mg daily  ?Atorvastatin 40 mg daly  ?Farxiga 10 mg daily  ?Digoxin 0.0625 mg daily  ?KCl 40 mEq bid  ?Spiro 12.5 mg daily  ?Torsemide  40 mg bid  ?Dobutamine 5 mcg/kg/min. Home w/ single lumen PICC in place.  ? ? ?Length of Stay: 18 ? ?Lyda Jester, PA-C  ?02/04/2022, 7:44 AM ? ?Advanced Heart Failure Team ?Pager [phone] (M-F; 7a - 5p)  ?Please contact [facility] for night-coverage after hours (5p -7a ) and weekends on [email or web address] ? ?Patient seen and examined with the above-signed Advanced Practice Provider and/or Housestaff. I personally reviewed laboratory data, imaging studies and relevant notes. I independently examined the patient and formulated the important aspects  of the plan. I have edited the note to reflect any of my changes or salient points. I have personally discussed the plan with the patient and/or family. ? ?Remains on DBA. Co-ox and CVP stable. Denies SOB. ? ?General: Sitting up in bed.. No resp difficulty ?HEENT: normal ?Neck: supple. JVP 6 Carotids 2+ bilat; no bruits. No lymphadenopathy or thryomegaly appreciated. ?[id number] rate & rhythm. Tachy + s3 2/6 TR ?Lungs: clear ?Abdomen: soft, nontender, nondistended. No hepatosplenomegaly. No bruits or masses. Good bowel sounds. ?Extremities: no cyanosis, clubbing, rash, edema ?Neuro: alert & orientedx3, cranial nerves grossly intact. moves all 4 extremities w/o difficulty. Affect pleasant ? ?He is end-stage. Not candidate for advanced therapies. [facility] has agreed to pay for one month of DBA. Have arranged for d/c home today with [facility] support.  ? ?Glori Bickers, MD  ?10:11 PM ? ? ? ?

## 2022-02-04 NOTE — Discharge Summary (Signed)
Physician Discharge Summary  ?Zachary Arias [id number] DOB: 07/24/1961 DOA: 01/17/2022 ? ?PCP: Default, Provider, MD ? ?Admit date: 01/17/2022 ?Discharge date: 02/04/2022 ? ?Time spent: 35 minutes ? ?Recommendations for Outpatient Follow-up:  ?CHF clinic in 1 week ?Home with Hospice services ? ? ?Discharge Diagnoses:  ?  ACute systolic CHF ?  Cardiogenic shock ?  Severe Pulm HTN ?  Moderate MR ?  Moderate TR ?  CKD 3a ?  CAD ?  Congestive heart failure ([place]) ?  Acute kidney injury superimposed on chronic kidney disease ([place]) ?  Right ankle pain ?  Dyslipidemia ? ? ?Discharge Condition: stable ? ?Diet recommendation: heart healthy, low sodium ? ?Filed Weights  ? 02/02/22 0600 02/03/22 0358 02/04/22 0406  ?Weight: 67.2 kg 67.2 kg 68.9 kg  ? ? ?History of present illness:  ?60/M with history of chronic combined CHF, EF 30%, severe pulmonary hypertension, moderate TR, CKD 3a, CAD was admitted 4/23 with cardiogenic shock ? ?Hospital Course:  ? ?Acute systolic CHF  ?Cardiogenic shock  ?Moderate to severe pulmonary hypertension, moderate MR, TR  ?-Echo  with reduced LV systolic function with EF 30 to 35%. Severe elevation in pulmonary artery systolic pressure, RVSP 64 mmHg.  ?-Diuresed aggressively on dobutamine and Lasix gtt., 2.5 L negative, weight down 25lbs ?-Had recurrent low output state once dobutamine discontinued, now inotrope dependent, considered end-stage by heart failure team, situation complicated by lack of support system, uninsured status etc.  ?-discharged home on IV DObutamine infusion via PICC line, approved for 1 month therapy ?-Continue torsemide, Farxiga, digoxin and Aldactone ?-FU in CHF clinic next week ?-also Home hospice set up at discharge, prognosis is poor ?  ?Acute kidney injury superimposed on chronic kidney disease ([place]) ?CKd stage 3a. Hypokalemia, hypernatremia.  ?-Cardiorenal syndrome, baseline creatinine is 1.4, now stable in the 1.6, 1.7 range ?  ?Right ankle pain ?-Per Dr. Nolon Lennert note  this did not respond to prednisone ?-Supportive care, monitor ?-improved ?  ?Dyslipidemia ?Continue with statin therapy.  ?  ?CAD ?-Cath with one-vessel CAD, chronically occluded RCA ?-Stable, continue aspirin and statin ?  ?PVCs ?-Remains on amiodarone ? ?Discharge Exam: ?Vitals:  ? 02/04/22 0724 02/04/22 1615  ?BP: 106/81 99/69  ?Pulse:    ?Resp: 17 17  ?Temp: (!) 97.4 ?F (36.3 ?C) 98.2 ?F (36.8 ?C)  ?SpO2:    ?General exam: Appears calm and comfortable  ?Respiratory system: Clear to auscultation ?Cardiovascular system: S1 & S2 heard, RRR.  ?Abd: nondistended, soft and nontender.Normal bowel sounds heard. ?Central nervous system: Alert and oriented. No focal neurological deficits. ?Extremities: no edema, right upper extremity PICC noted ?Skin: No rashes ?Psychiatry:  Mood & affect appropriate.  ?  ? ? ?Discharge Instructions ? ? ?Discharge Instructions   ? ? Diet - low sodium heart healthy   Complete by: As directed ?  ? Increase activity slowly   Complete by: As directed ?  ? ?  ? ?Allergies as of 02/04/2022   ? ?   Reactions  ? Avocado Nausea Only  ? Shellfish Allergy Nausea And Vomiting  ? ?  ? ?  ?Medication List  ?  ? ?STOP taking these medications   ? ?Entresto 97-103 MG ?Generic drug: sacubitril-valsartan ?  ?furosemide 20 MG tablet ?Commonly known as: Lasix ?  ? ?  ? ?TAKE these medications   ? ?amiodarone 200 MG tablet ?Commonly known as: PACERONE ?Take 1 tablet (200 mg total) by mouth 2 (two) times daily. ?  ?ASPIRIN 81 PO ?Take 81  mg by mouth daily. ?  ?atorvastatin 40 MG tablet ?Commonly known as: LIPITOR ?Take 1 tablet (40 mg total) by mouth daily. ?  ?digoxin 0.125 MG tablet ?Commonly known as: LANOXIN ?Take 1/2 tablets (0.0625 mg total) by mouth daily. ?  ?DOBUTamine 4-5 MG/ML-% infusion ?Commonly known as: DOBUTREX ?Inject 344.5 mcg/min into the vein continuous. ?  ?Farxiga 10 MG Tabs tablet ?Generic drug: dapagliflozin propanediol ?Take 1 tablet (10 mg total) by mouth daily before breakfast. ?   ?multivitamin capsule ?Take 1 capsule by mouth daily. ?  ?potassium chloride SA 20 MEQ tablet ?Commonly known as: KLOR-CON M ?Take 2 tablets (40 mEq total) by mouth 2 (two) times daily. ?  ?spironolactone 25 MG tablet ?Commonly known as: ALDACTONE ?Take 0.5 tablets (12.5 mg total) by mouth daily. ?  ?torsemide 20 MG tablet ?Commonly known as: DEMADEX ?Take 2 tablets (40 mg total) by mouth 2 (two) times daily. ?  ? ?  ? ?  ?  ? ? ?  ?Durable Medical Equipment  ?(From admission, onward)  ?  ? ? ?  ? ?  Start     Ordered  ? 02/04/22 0903  Heart failure home health orders  (Heart failure home health orders / Face to face)  Once       ?Comments: Heart Failure Follow-up Care:  ?Verify follow-up appointments per Patient Discharge Instructions. Confirm transportation arranged. ?Reconcile home medications with discharge medication list. Remove discontinued medications from use. Assist patient/caregiver to manage medications using pill box. ?Reinforce low sodium food selection ?Assessments: ?Vital signs and oxygen saturation at each visit. ?Assess home environment for safety concerns, caregiver support and availability of low-sodium foods. ?Environmental education officer, PT/OT, Dietitian, and CNA based on assessments. ?Perform comprehensive cardiopulmonary assessment. Notify MD for any change in condition or weight gain of 3 pounds in one day or 5 pounds in one week with symptoms. ?Daily Weights and Symptom Monitoring: ?Ensure patient has access to scales. ?Teach patient/caregiver to weigh daily before breakfast and after voiding using same scale and record.    ?Teach patient/caregiver to track weight and symptoms and when to notify Provider. ?Activity: ?Develop individualized activity plan with patient/caregiver. ?  ?Question Answer Comment  ?Heart Failure Follow-up Care [facility] ([facility]F) [facility] at [phone]   ?Obtain the following labs Basic Metabolic Panel   ?Lab frequency Weekly   ?Fax lab results to AHF Clinic at  [phone]   ?Diet Low Sodium Heart Healthy   ?Fluid restrictions: 1800 mL Fluid   ?  ? 02/04/22 0904  ? ?  ?  ? ?  ? ?Allergies  ?Allergen Reactions  ? Avocado Nausea Only  ? Shellfish Allergy Nausea And Vomiting  ? ? Follow-up Information   ? ? Camnitz, Ocie Doyne, MD Follow up.   ?Specialty: Cardiology ?Why: on 6/12 at 1115 to establish with Electrophysiology ?Contact information: ?[address] ?[address] ?[place] [address] ?[phone] ? ? ?  ?  ? ? [facility] Follow up on 02/12/2022.   ?Specialty: Cardiology ?Why: 5/19 at 2:30 PM  ?[facility] at [facility] ?Contact information: ?[address] ?[id number] mc ?[place] ?[phone] ? ?  ?  ? ? [facility] Follow up.   ?Why: Home Hospice RN will call to arrange visit ? ?Local office number [phone] information: ?[place] [address] ?[phone] ? ? ?  ?  ? ?  [facility] Follow up.   ?Why: agency will provided IV medications ?Contact information: ?#[phone] ? ?  ?  ? ?  ?  ? ?  ? ? ? ?The results of significant diagnostics from this hospitalization (including imaging, microbiology, ancillary and laboratory) are listed below for reference.   ? ?Significant Diagnostic Studies: ?DG Chest 2 View ? ?Result Date: 01/17/2022 ?CLINICAL DATA:  Shortness of breath EXAM: CHEST - 2 VIEW COMPARISON:  03/06/2020 FINDINGS: Cardiomegaly. Both lungs are clear. Disc degenerative disease of the thoracic spine. IMPRESSION: Cardiomegaly without acute abnormality of the lungs. Electronically Signed   By: Delanna Ahmadi M.D.   On: 01/17/2022 15:25  ? ?DG Ankle Complete Right ? ?Result Date: 01/24/2022 ?CLINICAL DATA:  RIGHT ankle swelling and pain. EXAM: RIGHT ANKLE - COMPLETE 3+ VIEW COMPARISON:  None. FINDINGS: No acute fracture, subluxation or dislocation identified. No joint effusion is noted. The joint  spaces are unremarkable. No focal bony lesions are identified. Mild diffuse soft tissue swelling is noted. IMPRESSION: Mild diffuse soft tissue swelling. No acute bony abnormality or joint effusion. Electronic

## 2022-02-04 NOTE — Progress Notes (Signed)
?  Mobility Specialist Criteria Algorithm Info. ? ? 02/04/22 0915  ?Mobility  ?Activity Ambulated with assistance in hallway;Dangled on edge of bed  ?Range of Motion/Exercises Active;All extremities  ?Level of Assistance Independent  ?Assistive Device None  ?Distance Ambulated (ft) 500 ft  ?Activity Response Tolerated well  ?Patient received dangling EOB agreeable to participate in mobility. Ambulated in hallway independently with steady gait. Returned to room without complaint or incident. Was left dangling with all needs met, call bell in reach.  ? ?02/04/2022 ?09:15 AM ? ?[place] Maliki Gignac, CMS, BS EXP ?Acute Rehabilitation Services  ?YOFVW:[phone] ?Office: [phone] ? ?

## 2022-02-04 NOTE — Care Management (Signed)
I discussed Mr Matuszak discharge paperwork with him and he told me he understood the discharge instructions. ? ?Joni Reining, RN ?

## 2022-02-04 NOTE — Care Management Note (Signed)
Mr Pieter's brother's contact information is: ?Conrad Zajkowski [phone], he lives in [place]. ? ?Joni Reining, RN ?

## 2022-02-05 ENCOUNTER — Other Ambulatory Visit (HOSPITAL_COMMUNITY): Payer: Self-pay

## 2022-02-05 ENCOUNTER — Telehealth (HOSPITAL_COMMUNITY): Payer: Self-pay | Admitting: Adult Health

## 2022-02-05 NOTE — Telephone Encounter (Signed)
?  I called him to discuss [facility] Hospice.  ? ?Earlier today he told the [facility] Team he did not want their services.  ? ?I called to discuss and told him this is the only option for home dobutamine and if he does not want the service we will need to stop dobutamine and remove PICC.  ? ?We discussed he has no insurance and he is not a citizen therefore [facility] was our only option.  Our team also gave the him the same information in the hospital .  ? ?At the end of the call he said that he is agreeable to [facility]. I reinforced if he refuses we will need to stop dobutamine, remove PICC, and remove home IV pump from his home.  ? ?He verbalized understanding.  ? ?Marguriete Wootan NP-C  ?3:04 PM ? ? ? ?

## 2022-02-10 ENCOUNTER — Other Ambulatory Visit (HOSPITAL_COMMUNITY): Payer: Self-pay | Admitting: Emergency Medicine

## 2022-02-10 NOTE — Progress Notes (Signed)
Paramedicine Encounter ? ? ? Patient ID: Zachary Arias, male    DOB: 07/27/1961, 61 y.o.   MRN: [id number] ? ?Met with Zachary Arias today for first home visit.  He reports to be feeling "ok".  He has no complaint of chest pain and no unusual SOB.  His PICC line site looked good with no redness or swelling noted.  Lung sounds clear and equal bilat.  No swelling noted to his lower extremities and he was wearing compression stockings.  He states that Hospice is coming by tomorrow to take care of his Dobutamine infusion.  We discussed his issues with getting his [facility] Card renewed and issues with financial challenges.  Coordinated with Eliezer Lofts to get a taxi voucher for his appointment at the clinic 02/12/22 @ 2:30.  I plan on attending this visit with him. ? ?BP 100/80 (BP Location: Left Arm, Patient Position: Sitting, Cuff Size: Normal)   Resp 16   Wt 146 lb 12.8 oz (66.6 kg)   SpO2 99%   BMI 22.99 kg/m?  ?Weight yesterday-146.8lbs  ?Last visit weight-147.4 lbs ? ?Patient Care Team: ?Default, Provider, MD as PCP - General ?Bensimhon, Shaune Pascal, MD as PCP - Advanced Heart Failure (Cardiology) ? ?Patient Active Problem List  ? Diagnosis Date Noted  ? Right ankle pain 01/27/2022  ? Dyslipidemia 01/27/2022  ? Acute kidney injury superimposed on chronic kidney disease ([place]) 01/17/2022  ? Hypotension 03/05/2020  ? Hyperkalemia 03/05/2020  ? Elevated WBC count 03/05/2020  ? Chronic bilateral pleural effusions   ? Hyperlipidemia LDL goal <70 09/18/2014  ? CAD, NATIVE VESSEL 07/22/2009  ? SYSTOLIC HEART FAILURE, CHRONIC 07/22/2009  ? HYPERTENSION 07/07/2009  ? CAD 07/07/2009  ? Congestive heart failure ([place]) 07/07/2009  ? TACHYCARDIA 07/07/2009  ? ? ?Current Outpatient Medications:  ?  amiodarone (PACERONE) 200 MG tablet, Take 1 tablet (200 mg total) by mouth 2 (two) times daily., Disp: 60 tablet, Rfl: 3 ?  ASPIRIN 81 PO, Take 81 mg by mouth daily., Disp: , Rfl:  ?  dapagliflozin propanediol (FARXIGA) 10 MG TABS tablet, Take 1  tablet (10 mg total) by mouth daily before breakfast., Disp: 30 tablet, Rfl: 11 ?  digoxin (LANOXIN) 0.125 MG tablet, Take 1/2 tablets (0.0625 mg total) by mouth daily., Disp: 30 tablet, Rfl: 11 ?  DOBUTamine (DOBUTREX) 4-5 MG/ML-% infusion, Inject 344.5 mcg/min into the vein continuous., Disp: 250 mL, Rfl:  ?  Multiple Vitamin (MULTIVITAMIN) capsule, Take 1 capsule by mouth daily., Disp: , Rfl:  ?  potassium chloride SA (KLOR-CON M) 20 MEQ tablet, Take 2 tablets (40 mEq total) by mouth 2 (two) times daily., Disp: 120 tablet, Rfl: 11 ?  spironolactone (ALDACTONE) 25 MG tablet, Take 0.5 tablets (12.5 mg total) by mouth daily., Disp: 15 tablet, Rfl: 3 ?  torsemide (DEMADEX) 20 MG tablet, Take 2 tablets (40 mg total) by mouth 2 (two) times daily., Disp: 120 tablet, Rfl: 5 ?  atorvastatin (LIPITOR) 40 MG tablet, Take 1 tablet (40 mg total) by mouth daily., Disp: 30 tablet, Rfl: 3 ?Allergies  ?Allergen Reactions  ? Avocado Nausea Only  ? Shellfish Allergy Nausea And Vomiting  ? ? ? ? ?Social History  ? ?Socioeconomic History  ? Marital status: Single  ?  Spouse name: Not on file  ? Number of children: Not on file  ? Years of education: Not on file  ? Highest education level: Not on file  ?Occupational History  ? Not on file  ?Tobacco Use  ? Smoking status:  Never  ? Smokeless tobacco: Never  ?Vaping Use  ? Vaping Use: Not on file  ?Substance and Sexual Activity  ? Alcohol use: Not Currently  ? Drug use: Not Currently  ? Sexual activity: Not on file  ?Other Topics Concern  ? Not on file  ?Social History Narrative  ? Not on file  ? ?Social Determinants of Health  ? ?Financial Resource Strain: High Risk  ? Difficulty of Paying Living Expenses: Hard  ?Food Insecurity: Food Insecurity Present  ? Worried About Charity fundraiser in the Last Year: Sometimes true  ? Ran Out of Food in the Last Year: Sometimes true  ?Transportation Needs: Unmet Transportation Needs  ? Lack of Transportation (Medical): Yes  ? Lack of  Transportation (Non-Medical): Yes  ?Physical Activity: Not on file  ?Stress: Not on file  ?Social Connections: Not on file  ?Intimate Partner Violence: Not on file  ? ? ?Physical Exam ? ? ? ? ? ?Future Appointments  ?Date Time Provider [place]  ?02/12/2022 10:00 AM Camillia Herter, NP P[facility] None  ?02/12/2022  2:30 PM [facility] PA/NP [facility] None  ?03/08/2022 11:15 AM Camnitz, Ocie Doyne, MD [facility]  ? ? ? ? ? ?Renee Ramus, EMT-Paramedic ?[phone] ?Community Health Paramedic  ?02/10/22  ?

## 2022-02-11 NOTE — Progress Notes (Deleted)
Subjective:    Zachary Arias - 61 y.o. male MRN [id number]  Date of birth: 1961/08/08  HPI  Zachary Arias is to establish care and hospital discharge follow-up.  Current issues and/or concerns: Hospital discharge follow-up: 01/17/2022 - 02/04/2022 [facility] per MD note:   Recommendations for Outpatient Follow-up:  CHF clinic in 1 week Home with Hospice services   Discharge Diagnoses:    ACute systolic CHF   Cardiogenic shock   Severe Pulm HTN   Moderate MR   Moderate TR   CKD 3a   CAD   Congestive heart failure (HCC)   Acute kidney injury superimposed on chronic kidney disease (HCC)   Right ankle pain   Dyslipidemia  Hospital Course:    Acute systolic CHF  Cardiogenic shock  Moderate to severe pulmonary hypertension, moderate MR, TR  -Echo  with reduced LV systolic function with EF 30 to 35%. Severe elevation in pulmonary artery systolic pressure, RVSP 64 mmHg.  -Diuresed aggressively on dobutamine and Lasix gtt., 2.5 L negative, weight down 25lbs -Had recurrent low output state once dobutamine discontinued, now inotrope dependent, considered end-stage by heart failure team, situation complicated by lack of support system, uninsured status etc.  -discharged home on IV DObutamine infusion via PICC line, approved for 1 month therapy -Continue torsemide, Farxiga, digoxin and Aldactone -FU in CHF clinic next week -also Home hospice set up at discharge, prognosis is poor   Acute kidney injury superimposed on chronic kidney disease ([place]) CKd stage 3a. Hypokalemia, hypernatremia.  -Cardiorenal syndrome, baseline creatinine is 1.4, now stable in the 1.6, 1.7 range   Right ankle pain -Per Dr. Nolon Lennert note this did not respond to prednisone -Supportive care, monitor -improved   Dyslipidemia Continue with statin therapy.    CAD -Cath with one-vessel CAD, chronically occluded RCA -Stable, continue aspirin and statin   PVCs -Remains on amiodarone    Follow-Ups  Follow up with Constance Haw, MD (Cardiology); on 6/12 at 1115 to establish with Electrophysiology Follow up with [place] (Cardiology) on 02/12/2022; 5/19 at 2:30 PM  The Advanced Heart Failure Clinic at [facility] Follow up with [facility], [place]; Home Hospice RN will call to arrange visit   Local office number [phone] Follow up with [facility]; agency will provided IV medications        Today's Visit:   Acute systolic CHF follow-up: Cardiogenic shock follow-up: Moderate to severe pulmonary hypertension, moderate MR, TR follow-up:   -discharged home on IV DObutamine infusion via PICC line, approved for 1 month therapy -Continue torsemide, Farxiga, digoxin and Aldactone -FU in CHF clinic next week -also Home hospice set up at discharge, prognosis is poor   Acute kidney injury superimposed on chronic kidney disease follow-up ([place]): CKd stage 3a.  Neph referral   Right ankle pain follow-up: this did not respond to prednisone -Supportive care, monitor  Dyslipidemia follow-up: statin therapy.    CAD follow-up: continue aspirin and statin   PVCs follow-up: amiodarone   Follow-Ups  - Follow up with Constance Haw, MD (Cardiology); on 6/12 at 1115 to establish with Electrophysiology - Follow up with [place] (Cardiology) on 02/12/2022; 5/19 at 2:30 PM  The Advanced Heart Failure Clinic at [place] up with [facility] [place]; Home Hospice RN will call to arrange visit  Local office number [phone] - Follow up with [facility]; agency will provided IV medications   ROS per HPI     Health Maintenance:  Health Maintenance Due  Topic Date Due   COVID-19 Vaccine (1) Never done   TETANUS/TDAP  Never done   COLONOSCOPY (Pts 45-69yrs Insurance coverage will need to be confirmed)   Never done   Zoster Vaccines- Shingrix (1 of 2) Never done     Past Medical History: Patient Active Problem List   Diagnosis Date Noted   Right ankle pain 01/27/2022   Dyslipidemia 01/27/2022   Acute kidney injury superimposed on chronic kidney disease ([place]) 01/17/2022   Hypotension 03/05/2020   Hyperkalemia 03/05/2020   Elevated WBC count 03/05/2020   Chronic bilateral pleural effusions    Hyperlipidemia LDL goal <70 09/18/2014   CAD, NATIVE VESSEL [id number]   SYSTOLIC HEART FAILURE, CHRONIC 07/22/2009   HYPERTENSION 07/07/2009   CAD 07/07/2009   Congestive heart failure ([place]) 07/07/2009   TACHYCARDIA 07/07/2009      Social History   reports that he has never smoked. He has never used smokeless tobacco. He reports that he does not currently use alcohol. He reports that he does not currently use drugs.   Family History  family history includes Cancer in his mother; Cardiomyopathy in his brother; Heart attack in his father; Heart disease in his father.   Medications: reviewed and updated   Objective:   Physical Exam There were no vitals taken for this visit. Physical Exam      Assessment & Plan:         Patient was given clear instructions to go to Emergency Department or return to medical center if symptoms don't improve, worsen, or new problems develop.The patient verbalized understanding.  I discussed the assessment and treatment plan with the patient. The patient was provided an opportunity to ask questions and all were answered. The patient agreed with the plan and demonstrated an understanding of the instructions.   The patient was advised to call back or seek an in-person evaluation if the symptoms worsen or if the condition fails to improve as anticipated.    Durene Fruits, NP 02/11/2022, 7:57 AM Primary Care at [facility]

## 2022-02-12 ENCOUNTER — Inpatient Hospital Stay: Payer: Self-pay | Admitting: Family

## 2022-02-12 ENCOUNTER — Encounter (HOSPITAL_COMMUNITY): Payer: Self-pay

## 2022-02-12 ENCOUNTER — Ambulatory Visit (HOSPITAL_COMMUNITY)
Admission: RE | Admit: 2022-02-12 | Discharge: 2022-02-12 | Disposition: A | Discharge status: Home / Self Care | Payer: Medicaid Other | Source: Ambulatory Visit | Attending: Family Medicine | Admitting: Family Medicine

## 2022-02-12 VITALS — BP 106/71 | HR 91 | Wt 151.8 lb

## 2022-02-12 DIAGNOSIS — Z8249 Family history of ischemic heart disease and other diseases of the circulatory system: Secondary | ICD-10-CM | POA: Diagnosis not present

## 2022-02-12 DIAGNOSIS — Z7189 Other specified counseling: Secondary | ICD-10-CM

## 2022-02-12 DIAGNOSIS — I5022 Chronic systolic (congestive) heart failure: Secondary | ICD-10-CM | POA: Insufficient documentation

## 2022-02-12 DIAGNOSIS — I493 Ventricular premature depolarization: Secondary | ICD-10-CM | POA: Insufficient documentation

## 2022-02-12 DIAGNOSIS — I2510 Atherosclerotic heart disease of native coronary artery without angina pectoris: Secondary | ICD-10-CM | POA: Diagnosis not present

## 2022-02-12 DIAGNOSIS — N1831 Chronic kidney disease, stage 3a: Secondary | ICD-10-CM | POA: Insufficient documentation

## 2022-02-12 DIAGNOSIS — I130 Hypertensive heart and chronic kidney disease with heart failure and stage 1 through stage 4 chronic kidney disease, or unspecified chronic kidney disease: Secondary | ICD-10-CM | POA: Insufficient documentation

## 2022-02-12 DIAGNOSIS — Z7984 Long term (current) use of oral hypoglycemic drugs: Secondary | ICD-10-CM | POA: Diagnosis not present

## 2022-02-12 DIAGNOSIS — Z79899 Other long term (current) drug therapy: Secondary | ICD-10-CM | POA: Insufficient documentation

## 2022-02-12 DIAGNOSIS — N183 Chronic kidney disease, stage 3 unspecified: Secondary | ICD-10-CM

## 2022-02-12 DIAGNOSIS — I428 Other cardiomyopathies: Secondary | ICD-10-CM | POA: Insufficient documentation

## 2022-02-12 DIAGNOSIS — E876 Hypokalemia: Secondary | ICD-10-CM

## 2022-02-12 LAB — BASIC METABOLIC PANEL
Anion gap: 9 (ref 5–15)
BUN: 25 mg/dL — ABNORMAL HIGH (ref 6–20)
CO2: 31 mmol/L (ref 22–32)
Calcium: 9.3 mg/dL (ref 8.9–10.3)
Chloride: 109 mmol/L (ref 98–111)
Creatinine, Ser: 1.9 mg/dL — ABNORMAL HIGH (ref 0.61–1.24)
GFR, Estimated: 40 mL/min — ABNORMAL LOW (ref >60)
Glucose, Bld: 84 mg/dL (ref 70–99)
Potassium: 5.1 mmol/L (ref 3.5–5.1)
Sodium: 149 mmol/L — ABNORMAL HIGH (ref 135–145)

## 2022-02-12 LAB — DIGOXIN LEVEL: Digoxin Level: 0.6 ng/mL — ABNORMAL LOW (ref 0.8–2.0)

## 2022-02-12 LAB — BRAIN NATRIURETIC PEPTIDE: B Natriuretic Peptide: 562.1 pg/mL — ABNORMAL HIGH (ref 0.0–100.0)

## 2022-02-12 NOTE — Patient Instructions (Addendum)
EKG done today.   Labs done today. We will contact you only if your labs are abnormal.  No medication changes were made. Please continue all current medications as prescribed.  Your physician recommends that you schedule a follow-up appointment in: 4-6 weeks with our NP/PA Clinic and 3 months with Dr. Gala Romney.  If you have any questions or concerns before your next appointment please send [place] a message through [place] or call our office at [phone].    TO LEAVE A MESSAGE FOR THE NURSE SELECT OPTION 2, PLEASE LEAVE A MESSAGE INCLUDING: YOUR NAME DATE OF BIRTH CALL BACK NUMBER REASON FOR CALL**this is important as we prioritize the call backs  YOU WILL RECEIVE A CALL BACK THE SAME DAY AS LONG AS YOU CALL BEFORE 4:00 PM   Do the following things EVERYDAY: Weigh yourself in the morning before breakfast. Write it down and keep it in a log. Take your medicines as prescribed Eat low salt foods--Limit salt (sodium) to 2000 mg per day.  Stay as active as you can everyday Limit all fluids for the day to less than 2 liters   At the Advanced Heart Failure Clinic, you and your health needs are our priority. As part of our continuing mission to provide you with exceptional heart care, we have created designated Provider Care Teams. These Care Teams include your primary Cardiologist (physician) and Advanced Practice Providers (APPs- Physician Assistants and Nurse Practitioners) who all work together to provide you with the care you need, when you need it.   You may see any of the following providers on your designated Care Team at your next follow up: Dr Arvilla Meres Dr Carron Curie, NP Robbie Lis, [place] [name], PharmD   Please be sure to bring in all your medications bottles to every appointment.

## 2022-02-12 NOTE — Progress Notes (Addendum)
ADVANCED HF CLINIC NOTE   Patient ID: Zachary Arias, male   DOB: 02-09-1961, 61 y.o.   MRN: [id number] PCP: Dr. Ruben Gottron HF Cardiologist: Dr Haroldine Laws   HPI: Zachary Arias is a 61 y.o. 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 Cath with 1-v CAD with chronically occluded RCA. MRI in January 2011 with EF 31% with inferior scar.   Brother had heart transplant in [place] several months ago. Dad also had previous heart transplant.   He was lost to f/u since 2015. Admitted 03/05/20 with cardiogenic shock after stopping meds for months and using supplements. SBP in 70-80s. ECHO EF 20-25% RV normal. Severe MR. Having frequent PVCs. Placed on amiodarone to suppress PVCs  [facility] 03/10/20 showed stable CAD (chronically occluded distal RCA and moderate non-obstructive CAD elsewhere), low filling pressures and normal cardiac output on milrinone 0.25. Was able to wean off milrinone. Started back on GDMT.  Repeat echo 01/05/21 EF 40-45% mild to mod MR RV mild HK   Lost to f/u.  Follow up 3/23, off all meds except ASA x 3 months, REDs 34%, Farxiga and losartan restarted, with close follow to add back GDMT. Zio 2 week placed to quantify PVCs, showed frequent NSVT, frequent PVCs (13.1%). Started on amio 200 mg bid and referred to EP.   Admitted 4/23 with a/c CHF, had been off all meds x 1 month. Started on IV lasix. BP dropped and developed AKI, AHF consulted. Empiric DBA started. Echo showed EF 30-35%. Continued IV diuresis, down 20 lbs overall. Failed DBA wean. GDMT titrated. Arranged for home DBA via PICC with Hospice (only option as he is not a citizen and uninsured).  Today he returns for post hospital HF follow up. Overall feeling fine. He lives on the 3rd floor apartment, no SOB going down stairs today; this is 1st time getting out since discharge. Occasional palpitations. Denies CP, dizziness, edema, or PND/Orthopnea. Appetite fair. No fever or chills.  Weight at home 146 pounds. Taking all medications. No issues with IV pump. He has [place] [employer], sports.  Cardiac Studies: - Echo (4/11): EF 30-35%. - Echo (8/13): EF 45-50% with inferior and inferoseptal hypokinesis - Echo (10/21): EF 30-35% - Echo (4/22): EF 40-45% mild to mod MR RV mild HK  - Zio 2 week (3/23): 13.1% PVCs - Echo (5/23):EF 30-35%  SH: Lives in [place], nonsmoker. He has been disabled since May. He needs assistance with transportation. Lives alone. He uses [employer] grocer[employer].   FH: No premature CAD  ROS: All systems negative except as listed in HPI, PMH and Problem List.  Past Medical History:  Diagnosis Date   Congestive heart failure (CHF) ([place])    secondary to ischemic cardiomyopathy with an ejection fraction of 25-30%   Coronary artery disease    History of hypertension    Inguinal hernia 2005   History of left inguinal hernia, status post repair in 2005   Tachycardia 2010     Unexplained tachycardia during hospitalization   Current Outpatient Medications  Medication Sig Dispense Refill   amiodarone (PACERONE) 200 MG tablet Take 1 tablet (200 mg total) by mouth 2 (two) times daily. 60 tablet 3   ASPIRIN 81 PO Take 81 mg by mouth daily.     atorvastatin (LIPITOR) 40 MG tablet Take 1 tablet (40 mg total) by mouth daily. 30 tablet 3   dapagliflozin propanediol (FARXIGA) 10 MG TABS tablet Take 1 tablet (10 mg total) by  mouth daily before breakfast. 30 tablet 11   digoxin (LANOXIN) 0.125 MG tablet Take 1/2 tablets (0.0625 mg total) by mouth daily. 30 tablet 11   DOBUTamine (DOBUTREX) 4-5 MG/ML-% infusion Inject 344.5 mcg/min into the vein continuous. 250 mL    Multiple Vitamin (MULTIVITAMIN) capsule Take 1 capsule by mouth daily.     potassium chloride SA (KLOR-CON M) 20 MEQ tablet Take 2 tablets (40 mEq total) by mouth 2 (two) times daily. 120 tablet 11   spironolactone (ALDACTONE) 25 MG tablet Take 0.5 tablets (12.5 mg total) by mouth daily. 15 tablet 3    torsemide (DEMADEX) 20 MG tablet Take 2 tablets (40 mg total) by mouth 2 (two) times daily. 120 tablet 5   No current facility-administered medications for this visit.   BP 106/71   Pulse 91   Wt 68.9 kg (151 lb 12.8 oz)   SpO2 95%   BMI 23.78 kg/m   Wt Readings from Last 3 Encounters:  02/10/22 66.6 kg (146 lb 12.8 oz)  02/04/22 68.9 kg (151 lb 14.4 oz)  12/21/21 80.1 kg (176 lb 9.6 oz)   PHYSICAL EXAM: General:  NAD. No resp difficulty HEENT: Normal Neck: Supple. JVP 5-6. Carotids 2+ bilat; no bruits. No lymphadenopathy or thryomegaly appreciated. Cor: PMI nondisplaced. Regular rate & rhythm. No rubs, gallops, 2/6 TR Lungs: Clear Abdomen: Soft, nontender, nondistended. No hepatosplenomegaly. No bruits or masses. Good bowel sounds. Extremities: No cyanosis, clubbing, rash, edema; RUE PICC looks OK Neuro: Alert & oriented x 3, cranial nerves grossly intact. Moves all 4 extremities w/o difficulty. Affect pleasant.  ECG (personally reviewed): SR 87 bpm  ASSESSMENT & PLAN: 1. Chronic Systolic Heart Failure - Cath with 1-v CAD with chronically occluded RCA. MRI in January 2011 with EF 31% with inferior scar.  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   - Echo (8/13): EF 45-50% .  - No f/u between to 2015-2021 - Echo (6/21): EF 20-25% severe central MR. Repeat cath 6/21 with stable CAD - Echo (4/22): EF 40-45% mild to mod MR RV mild HK  - Echo (4/23): EF 30-35%.   - Admit 4/23 with shock, started on DBA and lasix. Diuresed 20 lbs. Unable to wean DBA - NYHA II-early III, functional status difficult as he is not physically active. Volume looks OK today. - Continue DBA 5 mcg/kg/min via single lumen PICC - Continue torsemide 40 mg bid.  - Continue Farxiga 10 mg daily  - Continue digoxin 0.0625 mg daily. - Continue spiro 12.5 mg daily. - He is end-stage and inotrope dependent. Not a good candidate for advanced therapies with lack of support system  and does not want this  - Currently has no insurance. Medicaid pending. Received notification the letter of guarantee for payment of palliative home IV dobutamine was approved x 1 month. He understands that his options are extremely limited. Discussed with Dr. Haroldine Laws. - Labs today.   2. CKD Stage IIIa - Creatinine baseline 1.4 - Renal US - no hydronephrosis.  - Labs today.   3. PVCs - Continue amio 200 mg bid. - Well suppressed.   4. CAD  - Cath with 1-v CAD with chronically occluded RCA - No chest pain.  - Continue ASA and atorvastatin.    5. H/o Hypokalemia  - Continue spiro  - Labs today  6. [place] - Hospice through [facility] (only option with no insurance and non-citizen) - HFSW aware.  Follow up in 2 weeks with Dr. Haroldine Laws to discuss options (  weaning DBA vs stopping).  Allena Katz, FNP-BC 02/12/22

## 2022-02-17 ENCOUNTER — Telehealth (HOSPITAL_COMMUNITY): Payer: Self-pay | Admitting: Surgery

## 2022-02-17 ENCOUNTER — Other Ambulatory Visit (HOSPITAL_COMMUNITY): Payer: Self-pay | Admitting: Emergency Medicine

## 2022-02-17 ENCOUNTER — Other Ambulatory Visit (HOSPITAL_COMMUNITY): Payer: Self-pay

## 2022-02-17 MED ORDER — POTASSIUM CHLORIDE CRYS ER 20 MEQ PO TBCR
40.0000 meq | EXTENDED_RELEASE_TABLET | Freq: Every day | ORAL | 11 refills | Status: DC
  Filled 2022-02-17 – 2022-03-08 (×2): qty 120, 60d supply, fill #0
  Filled 2022-05-04: qty 120, 60d supply, fill #1

## 2022-02-17 NOTE — Progress Notes (Signed)
Paramedicine Encounter    Patient ID: Zachary Arias, male    DOB: 04/30/1961, 61 y.o.   MRN: [id number]  ATF [name] without complaint of chest pain or SOB.  He states that he has been feeling very sleepy lately especially after he eats.  He also complains of a dry mouth and sometimes finds it difficult to eat due to lack of saliva. He has been compliant with his medications and is taking his last multivitamin today and has to wait to get paid to get another bottle.  He states that his dobutemine pump has been beeping occasionally several days ago.  I looked at the pump but did not see any alerts and did not try and manipulate it in any way.  Hospice is suppose to come out tomorrow and I suggested he make them aware of what's going on.  Also if he has further issues with it today to call Hospice.  BP 110/78 (BP Location: Left Arm, Patient Position: Sitting, Cuff Size: Normal)   Pulse 84   Resp 16   Wt 144 lb 3.2 oz (65.4 kg)   SpO2 99%   BMI 22.58 kg/m  Weight yesterday-143lbs Last visit weight-146lbs  Patient Care Team: Default, Provider, MD as PCP - General Bensimhon, Shaune Pascal, MD as PCP - Advanced Heart Failure (Cardiology)  Patient Active Problem List   Diagnosis Date Noted   Right ankle pain 01/27/2022   Dyslipidemia 01/27/2022   Acute kidney injury superimposed on chronic kidney disease ([place]) 01/17/2022   Hypotension 03/05/2020   Hyperkalemia 03/05/2020   Elevated WBC count 03/05/2020   Chronic bilateral pleural effusions    Hyperlipidemia LDL goal <70 09/18/2014   CAD, NATIVE VESSEL [id number]   SYSTOLIC HEART FAILURE, CHRONIC 07/22/2009   HYPERTENSION 07/07/2009   CAD 07/07/2009   Congestive heart failure ([place]) 07/07/2009   TACHYCARDIA 07/07/2009    Current Outpatient Medications:    amiodarone (PACERONE) 200 MG tablet, Take 1 tablet (200 mg total) by mouth 2 (two) times daily., Disp: 60 tablet, Rfl: 3   ASPIRIN 81 PO, Take 81 mg by mouth daily., Disp: , Rfl:     atorvastatin (LIPITOR) 40 MG tablet, Take 1 tablet (40 mg total) by mouth daily., Disp: 30 tablet, Rfl: 3   digoxin (LANOXIN) 0.125 MG tablet, Take 1/2 tablets (0.0625 mg total) by mouth daily., Disp: 30 tablet, Rfl: 11   DOBUTamine (DOBUTREX) 4-5 MG/ML-% infusion, Inject 344.5 mcg/min into the vein continuous., Disp: 250 mL, Rfl:    Multiple Vitamin (MULTIVITAMIN) capsule, Take 1 capsule by mouth daily., Disp: , Rfl:    potassium chloride SA (KLOR-CON M) 20 MEQ tablet, Take 2 tablets (40 mEq total) by mouth 2 (two) times daily., Disp: 120 tablet, Rfl: 11   spironolactone (ALDACTONE) 25 MG tablet, Take 0.5 tablets (12.5 mg total) by mouth daily., Disp: 15 tablet, Rfl: 3   torsemide (DEMADEX) 20 MG tablet, Take 2 tablets (40 mg total) by mouth 2 (two) times daily., Disp: 120 tablet, Rfl: 5   dapagliflozin propanediol (FARXIGA) 10 MG TABS tablet, Take 1 tablet (10 mg total) by mouth daily before breakfast., Disp: 30 tablet, Rfl: 11 Allergies  Allergen Reactions   Avocado Nausea Only   Shellfish Allergy Nausea And Vomiting      Social History   Socioeconomic History   Marital status: Single    Spouse name: Not on file   Number of children: Not on file   Years of education: Not on file   Highest education  level: Not on file  Occupational History   Not on file  Tobacco Use   Smoking status: Never   Smokeless tobacco: Never  Vaping Use   Vaping Use: Not on file  Substance and Sexual Activity   Alcohol use: Not Currently   Drug use: Not Currently   Sexual activity: Not on file  Other Topics Concern   Not on file  Social History Narrative   Not on file   Social Determinants of Health   Financial Resource Strain: High Risk   Difficulty of Paying Living Expenses: Hard  Food Insecurity: Food Insecurity Present   Worried About [place] in the Last Year: Sometimes true   Ran Out of Food in the Last Year: Sometimes true  Transportation Needs: Unmet Transportation Needs    Lack of Transportation (Medical): Yes   Lack of Transportation (Non-Medical): Yes  Physical Activity: Not on file  Stress: Not on file  Social Connections: Not on file  Intimate Partner Violence: Not on file    Physical Exam      Future Appointments  Date Time Provider [place]  03/08/2022 11:15 AM Constance Haw, MD [facility]  03/12/2022  2:30 PM [facility] PA/NP [facility] None  05/18/2022  2:00 PM Bensimhon, Shaune Pascal, MD [place] None       Renee Ramus, [facility] [facility] Paramedic  02/17/22

## 2022-02-17 NOTE — Telephone Encounter (Signed)
-----   Message from Jacklynn Ganong, [place] sent at 02/12/2022  4:20 PM EDT ----- Decrease KCL to 40 daily. Will need repeat BMET in 10 days (patient on DBA infusion, HH RN draws labs).

## 2022-02-17 NOTE — Telephone Encounter (Signed)
I called patient to review results and recommendations per provider.  He is aware of Potassium medication change to 40 meq daily.  I will contact company to request bmet through [facility].

## 2022-02-18 ENCOUNTER — Telehealth (HOSPITAL_COMMUNITY): Payer: Self-pay | Admitting: *Deleted

## 2022-02-18 NOTE — Telephone Encounter (Signed)
Zachary Arias with [facility] requests a return call from a provider about plan for patient and she has questions about him continuing dobutamine. Call back # [phone]   Routed to [facility]

## 2022-02-19 NOTE — Addendum Note (Signed)
Encounter addended by: Jacklynn Ganong, FNP on: 02/19/2022 1:29 PM  Actions taken: Clinical Note Signed

## 2022-02-25 ENCOUNTER — Other Ambulatory Visit (HOSPITAL_COMMUNITY): Payer: Self-pay | Admitting: Emergency Medicine

## 2022-02-25 NOTE — Progress Notes (Signed)
Paramedicine Encounter    Patient ID: Zachary Arias, male    DOB: [birth year], 61 y.o.   MRN: [id number]   Pt. Appears in good spirits and just a states, "I just feel so tired all the time"  He reports that 5/29 around 2:00 a.m. his pump stopped working and he had to call the Hospice nurse out.  She came and was able to get the pump working again.   Lung sounds are clear and equal bilat.  He denies chest pain or SOB.  Edema was noted to both lower extremities with his rt. Lower leg being more swollen than the left.  This is the first time I've noticed any edema for him since I have been doing his visits.  He has not yet taken his morning medications.  I discussed with him that we will be discharging him from the Para-medicine program since Hospice is taking care of him and his Dobutamine infusion and other needs at this time.  BP 102/80 (BP Location: Left Arm, Patient Position: Sitting, Cuff Size: Normal)   Pulse 88   Resp 16   Wt 142 lb (64.4 kg)   SpO2 98%   BMI 22.24 kg/m  Weight yesterday-143lbs Last visit weight-144lbs  Patient Care Team: Default, Provider, MD as PCP - General Bensimhon, Bevelyn Buckles, MD as PCP - Advanced Heart Failure (Cardiology)  Patient Active Problem List   Diagnosis Date Noted   Right ankle pain 01/27/2022   Dyslipidemia 01/27/2022   Acute kidney injury superimposed on chronic kidney disease (HCC) 01/17/2022   Hypotension 03/05/2020   Hyperkalemia 03/05/2020   Elevated WBC count 03/05/2020   Chronic bilateral pleural effusions    Hyperlipidemia LDL goal <70 09/18/2014   CAD, NATIVE VESSEL 07/22/2009   SYSTOLIC HEART FAILURE, CHRONIC 07/22/2009   HYPERTENSION 07/07/2009   CAD 07/07/2009   Congestive heart failure (HCC) 07/07/2009   TACHYCARDIA 07/07/2009    Current Outpatient Medications:    amiodarone (PACERONE) 200 MG tablet, Take 1 tablet (200 mg total) by mouth 2 (two) times daily., Disp: 60 tablet, Rfl: 3   ASPIRIN 81 PO, Take 81 mg by mouth daily.,  Disp: , Rfl:    atorvastatin (LIPITOR) 40 MG tablet, Take 1 tablet (40 mg total) by mouth daily., Disp: 30 tablet, Rfl: 3   dapagliflozin propanediol (FARXIGA) 10 MG TABS tablet, Take 1 tablet (10 mg total) by mouth daily before breakfast., Disp: 30 tablet, Rfl: 11   digoxin (LANOXIN) 0.125 MG tablet, Take 1/2 tablets (0.0625 mg total) by mouth daily., Disp: 30 tablet, Rfl: 11   DOBUTamine (DOBUTREX) 4-5 MG/ML-% infusion, Inject 344.5 mcg/min into the vein continuous., Disp: 250 mL, Rfl:    Multiple Vitamin (MULTIVITAMIN) capsule, Take 1 capsule by mouth daily., Disp: , Rfl:    potassium chloride SA (KLOR-CON M) 20 MEQ tablet, Take 2 tablets (40 mEq total) by mouth daily., Disp: 120 tablet, Rfl: 11   spironolactone (ALDACTONE) 25 MG tablet, Take 0.5 tablets (12.5 mg total) by mouth daily., Disp: 15 tablet, Rfl: 3   torsemide (DEMADEX) 20 MG tablet, Take 2 tablets (40 mg total) by mouth 2 (two) times daily., Disp: 120 tablet, Rfl: 5 Allergies  Allergen Reactions   Avocado Nausea Only   Shellfish Allergy Nausea And Vomiting      Social History   Socioeconomic History   Marital status: Single    Spouse name: Not on file   Number of children: Not on file   Years of education: Not on file  Highest education level: Not on file  Occupational History   Not on file  Tobacco Use   Smoking status: Never   Smokeless tobacco: Never  Vaping Use   Vaping Use: Not on file  Substance and Sexual Activity   Alcohol use: Not Currently   Drug use: Not Currently   Sexual activity: Not on file  Other Topics Concern   Not on file  Social History Narrative   Not on file   Social Determinants of Health   Financial Resource Strain: High Risk   Difficulty of Paying Living Expenses: Hard  Food Insecurity: Food Insecurity Present   Worried About Running Out of Food in the Last Year: Sometimes true   Ran Out of Food in the Last Year: Sometimes true  Transportation Needs: Unmet Transportation Needs    Lack of Transportation (Medical): Yes   Lack of Transportation (Non-Medical): Yes  Physical Activity: Not on file  Stress: Not on file  Social Connections: Not on file  Intimate Partner Violence: Not on file    Physical Exam      Future Appointments  Date Time Provider Department Center  03/08/2022 11:15 AM Regan Lemming, MD [facility]  03/09/2022  3:20 PM Bensimhon, Bevelyn Buckles, MD [facility] None       Beatrix Shipper, EMT-Paramedic [phone] [facility] Paramedic  02/25/22

## 2022-02-26 ENCOUNTER — Other Ambulatory Visit (HOSPITAL_COMMUNITY): Payer: Self-pay

## 2022-03-02 ENCOUNTER — Other Ambulatory Visit (HOSPITAL_COMMUNITY): Payer: Self-pay

## 2022-03-02 ENCOUNTER — Telehealth (HOSPITAL_COMMUNITY): Payer: Self-pay | Admitting: Cardiology

## 2022-03-02 NOTE — Telephone Encounter (Signed)
Abnormal labs received from HH/Hospice Labs drawn 02/27/22 Na 152 Cr 1.67 K 3.7  Per [place], NP Repeat bmet to confirm sodium Decrease torsemide to 40/20   LMOM with Kennon Rounds, RN  Order faxed to [facility] at  [phone] Verbal given to [place] [phone]

## 2022-03-03 ENCOUNTER — Other Ambulatory Visit (HOSPITAL_COMMUNITY): Payer: Self-pay

## 2022-03-03 ENCOUNTER — Telehealth (HOSPITAL_COMMUNITY): Payer: Self-pay

## 2022-03-03 MED ORDER — FARXIGA 10 MG PO TABS
ORAL_TABLET | ORAL | 2 refills | Status: DC
  Filled 2022-03-03: qty 15, 15d supply, fill #0
  Filled 2022-03-22: qty 15, 15d supply, fill #1

## 2022-03-03 NOTE — Telephone Encounter (Signed)
Kennon Rounds with hospice called to ask if there was another medication we could replace the [place] with. She said jardaince is too expensive as well and they are not willing to cover it. Please advise    [phone]

## 2022-03-03 NOTE — Telephone Encounter (Signed)
They are not willing to cover jardiance.

## 2022-03-03 NOTE — Telephone Encounter (Signed)
Kennon Rounds advised and verbalized understanding.she said she will ask the office if they are willing to cover the jardiance one more time

## 2022-03-04 ENCOUNTER — Other Ambulatory Visit (HOSPITAL_COMMUNITY): Payer: Self-pay | Admitting: Internal Medicine

## 2022-03-04 NOTE — Telephone Encounter (Addendum)
Kennon Rounds called back today to report Hospice will not cover Jardiance or Farxiga, she has instructed pt to increase Cleda Daub to 25 mg daily as our office recommended yesterday, pt was supposed to get labs today however he was stuck multiple times with no results. Advised with K 5.1 last week should not increase Cleda Daub, continue with 12.5 mg daily, if issues with fluid/volume overload before appt 6/13 she will let us know, however she feels pt is more on dehydrated side.

## 2022-03-07 ENCOUNTER — Encounter (HOSPITAL_COMMUNITY): Payer: Self-pay | Admitting: Internal Medicine

## 2022-03-08 ENCOUNTER — Institutional Professional Consult (permissible substitution): Payer: Self-pay | Admitting: Cardiology

## 2022-03-08 ENCOUNTER — Other Ambulatory Visit (HOSPITAL_COMMUNITY): Payer: Self-pay | Admitting: *Deleted

## 2022-03-08 ENCOUNTER — Other Ambulatory Visit (HOSPITAL_COMMUNITY): Payer: Self-pay

## 2022-03-08 ENCOUNTER — Telehealth (HOSPITAL_COMMUNITY): Payer: Self-pay | Admitting: Licensed Clinical Social Worker

## 2022-03-08 MED ORDER — TORSEMIDE 20 MG PO TABS
40.0000 mg | ORAL_TABLET | Freq: Two times a day (BID) | ORAL | 5 refills | Status: DC
Start: 2022-03-08 — End: 2022-03-09
  Filled 2022-03-08: qty 120, 30d supply, fill #0

## 2022-03-08 MED ORDER — DIGOXIN 125 MCG PO TABS
0.0625 mg | ORAL_TABLET | Freq: Every day | ORAL | 11 refills | Status: DC
  Filled 2022-03-08: qty 30, 60d supply, fill #0

## 2022-03-08 NOTE — Telephone Encounter (Signed)
H&V Care Navigation CSW Progress Note  Clinical Social Worker contacted patient by phone to discuss transportation need.   SDOH Screenings   Alcohol Screen: Not on file  Depression (GGY6-9): Not on file  Financial Resource Strain: High Risk (01/21/2022)   Overall Financial Resource Strain (CARDIA)    Difficulty of Paying Living Expenses: Hard  Food Insecurity: Food Insecurity Present (01/21/2022)   Hunger Vital Sign    Worried About Running Out of Food in the Last Year: Sometimes true    Ran Out of Food in the Last Year: Sometimes true  Housing: Low Risk  (01/21/2022)   Housing    Last Housing Risk Score: 0  Physical Activity: Not on file  Social Connections: Not on file  Stress: Not on file  Tobacco Use: Low Risk  (02/12/2022)   Patient History    Smoking Tobacco Use: Never    Smokeless Tobacco Use: Never    Passive Exposure: Not on file  Transportation Needs: Unmet Transportation Needs (03/08/2022)   PRAPARE - Transportation    Lack of Transportation (Medical): Yes    Lack of Transportation (Non-Medical): Yes   Pt reports he has no way to get to clinic appt tomorrow. CSW helped arrange taxi to pick pt up at 2:30pm tomorrow and bring to appt.    Will continue to follow and assist as needed  Burna Sis, LCSW Clinical Social Worker Advanced Heart Failure Clinic Desk#: [phone] Cell#: [phone]

## 2022-03-09 ENCOUNTER — Ambulatory Visit (HOSPITAL_COMMUNITY)
Admission: RE | Admit: 2022-03-09 | Discharge: 2022-03-09 | Disposition: A | Discharge status: Home / Self Care | Payer: Medicaid Other | Source: Ambulatory Visit | Attending: Internal Medicine | Admitting: Internal Medicine

## 2022-03-09 ENCOUNTER — Other Ambulatory Visit (HOSPITAL_COMMUNITY): Payer: Self-pay

## 2022-03-09 ENCOUNTER — Encounter (HOSPITAL_COMMUNITY): Payer: Self-pay | Admitting: Internal Medicine

## 2022-03-09 VITALS — BP 132/98 | HR 78 | Wt 143.6 lb

## 2022-03-09 DIAGNOSIS — N1831 Chronic kidney disease, stage 3a: Secondary | ICD-10-CM | POA: Insufficient documentation

## 2022-03-09 DIAGNOSIS — Z79899 Other long term (current) drug therapy: Secondary | ICD-10-CM | POA: Diagnosis not present

## 2022-03-09 DIAGNOSIS — Z5989 Other problems related to housing and economic circumstances: Secondary | ICD-10-CM | POA: Insufficient documentation

## 2022-03-09 DIAGNOSIS — I255 Ischemic cardiomyopathy: Secondary | ICD-10-CM | POA: Insufficient documentation

## 2022-03-09 DIAGNOSIS — I472 Ventricular tachycardia, unspecified: Secondary | ICD-10-CM

## 2022-03-09 DIAGNOSIS — I2510 Atherosclerotic heart disease of native coronary artery without angina pectoris: Secondary | ICD-10-CM | POA: Insufficient documentation

## 2022-03-09 DIAGNOSIS — I493 Ventricular premature depolarization: Secondary | ICD-10-CM | POA: Insufficient documentation

## 2022-03-09 DIAGNOSIS — Z7982 Long term (current) use of aspirin: Secondary | ICD-10-CM | POA: Diagnosis not present

## 2022-03-09 DIAGNOSIS — I130 Hypertensive heart and chronic kidney disease with heart failure and stage 1 through stage 4 chronic kidney disease, or unspecified chronic kidney disease: Secondary | ICD-10-CM | POA: Diagnosis not present

## 2022-03-09 DIAGNOSIS — I5022 Chronic systolic (congestive) heart failure: Secondary | ICD-10-CM | POA: Insufficient documentation

## 2022-03-09 DIAGNOSIS — Z95828 Presence of other vascular implants and grafts: Secondary | ICD-10-CM | POA: Insufficient documentation

## 2022-03-09 DIAGNOSIS — N183 Chronic kidney disease, stage 3 unspecified: Secondary | ICD-10-CM

## 2022-03-09 LAB — BASIC METABOLIC PANEL
Anion gap: 8 (ref 5–15)
BUN: 22 mg/dL — ABNORMAL HIGH (ref 6–20)
CO2: 31 mmol/L (ref 22–32)
Calcium: 9.2 mg/dL (ref 8.9–10.3)
Chloride: 110 mmol/L (ref 98–111)
Creatinine, Ser: 1.71 mg/dL — ABNORMAL HIGH (ref 0.61–1.24)
GFR, Estimated: 46 mL/min — ABNORMAL LOW (ref >60)
Glucose, Bld: 103 mg/dL — ABNORMAL HIGH (ref 70–99)
Potassium: 4 mmol/L (ref 3.5–5.1)
Sodium: 149 mmol/L — ABNORMAL HIGH (ref 135–145)

## 2022-03-09 LAB — BRAIN NATRIURETIC PEPTIDE: B Natriuretic Peptide: 503.3 pg/mL — ABNORMAL HIGH (ref 0.0–100.0)

## 2022-03-09 NOTE — Progress Notes (Signed)
ADVANCED HF CLINIC NOTE   Patient ID: Zachary Arias, male   DOB: [birth year], 61 y.o.   MRN: [id number] PCP: Dr. Ruben Gottron HF Cardiologist: Dr Haroldine Laws   HPI: Zachary Arias is a 61 y.o. 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 Cath with 1-v CAD with chronically occluded RCA. MRI in January 2011 with EF 31% with inferior scar.   Brother had heart transplant in [place] several months ago. Dad also had previous heart transplant.   He was lost to f/u since 2015. Admitted 03/05/20 with cardiogenic shock after stopping meds for months and using supplements. SBP in 70-80s. ECHO EF 20-25% RV normal. Severe MR. Having frequent PVCs. Placed on amiodarone to suppress PVCs  [facility] 03/10/20 showed stable CAD (chronically occluded distal RCA and moderate non-obstructive CAD elsewhere), low filling pressures and normal cardiac output on milrinone 0.25. Was able to wean off milrinone. Started back on GDMT.  Repeat echo 01/05/21 EF 40-45% mild to mod MR RV mild HK   Lost to f/u.  Follow up 3/23, off all meds except ASA x 3 months, REDs 34%, Farxiga and losartan restarted, with close follow to add back GDMT. Zio 2 week placed to quantify PVCs, showed frequent NSVT, frequent PVCs (13.1%). Started on amio 200 mg bid and referred to EP.   Admitted 4/23 with a/c CHF, had been off all meds x 1 month. Started on IV lasix. BP dropped and developed AKI, AHF consulted. Empiric DBA started. Echo showed EF 30-35%. Continued IV diuresis, down 20 lbs overall. Failed DBA wean. GDMT titrated. Arranged for home DBA via PICC with Hospice (only option as he is not a citizen and uninsured).  Today he returns for follow up. Remains on DBA 5. Feels good. Denies CP or SOB. No edema, orthopnea or PND. Says he is hungry but not eating much.   Cardiac Studies: - Echo (4/11): EF 30-35%. - Echo (8/13): EF 45-50% with inferior and inferoseptal hypokinesis - Echo (10/21): EF 30-35% - Echo  (4/22): EF 40-45% mild to mod MR RV mild HK  - Zio 2 week (3/23): 13.1% PVCs - Echo (5/23):EF 30-35%  SH: Lives in [place], nonsmoker. He has been disabled since May. He needs assistance with transportation. Lives alone. He uses [employer] grocer[employer].   FH: No premature CAD  ROS: All systems negative except as listed in HPI, PMH and Problem List.  Past Medical History:  Diagnosis Date   Congestive heart failure (CHF) ([place])    secondary to ischemic cardiomyopathy with an ejection fraction of 25-30%   Coronary artery disease    History of hypertension    Inguinal hernia 2005   History of left inguinal hernia, status post repair in 2005   Tachycardia 2010     Unexplained tachycardia during hospitalization   Current Outpatient Medications  Medication Sig Dispense Refill   amiodarone (PACERONE) 200 MG tablet Take 1 tablet (200 mg total) by mouth 2 (two) times daily. 60 tablet 3   ASPIRIN 81 PO Take 81 mg by mouth daily.     atorvastatin (LIPITOR) 40 MG tablet Take 1 tablet (40 mg total) by mouth daily. 30 tablet 3   dapagliflozin propanediol (FARXIGA) 10 MG TABS tablet Take 1 tablet (=10mg ) by mouth 1 time daily for the heart 15 tablet 2   digoxin (LANOXIN) 0.125 MG tablet Take 1/2 tablets (0.0625 mg total) by mouth daily. 30 tablet 11   DOBUTamine (DOBUTREX) 4-5 MG/ML-% infusion  Inject 344.5 mcg/min into the vein continuous. 250 mL    Multiple Vitamin (MULTIVITAMIN) capsule Take 1 capsule by mouth daily.     potassium chloride SA (KLOR-CON M) 20 MEQ tablet Take 2 tablets (40 mEq total) by mouth daily. 120 tablet 11   spironolactone (ALDACTONE) 25 MG tablet Take 0.5 tablets (12.5 mg total) by mouth daily. 15 tablet 3   torsemide (DEMADEX) 20 MG tablet 2 tablets by mouth every morning and 1 tablet by mouth every evening.     No current facility-administered medications for this encounter.   BP (!) 132/98   Pulse 78   Wt 65.1 kg (143 lb 9.6 oz)   SpO2 99%   BMI 22.49 kg/m    Wt Readings from Last 3 Encounters:  03/09/22 65.1 kg (143 lb 9.6 oz)  02/25/22 64.4 kg (142 lb)  02/17/22 65.4 kg (144 lb 3.2 oz)   PHYSICAL EXAM: General:  NAD. No resp difficulty HEENT: normal Neck: supple. no JVD. Carotids 2+ bilat; no bruits. No lymphadenopathy or thryomegaly appreciated. Cor: PMI laterally displaced. Regular rate & rhythm. + prominent s3 Lungs: clear Abdomen: soft, nontender, nondistended. No hepatosplenomegaly. No bruits or masses. Good bowel sounds. Extremities: no cyanosis, clubbing, rash, edema + skin peeling Neuro: alert & orientedx3, cranial nerves grossly intact. moves all 4 extremities w/o difficulty. Affect pleasant    ASSESSMENT & PLAN: 1. Chronic Systolic Heart Failure - Cath with 1-v CAD with chronically occluded RCA. MRI in January 2011 with EF 31% with inferior scar.  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   - Echo (8/13): EF 45-50% .  - No f/u between to 2015-2021 - Echo (6/21): EF 20-25% severe central MR. Repeat cath 6/21 with stable CAD - Echo (4/22): EF 40-45% mild to mod MR RV mild HK  - Echo (4/23): EF 30-35%.   - Admit 4/23 with shock, started on DBA and lasix. Diuresed 20 lbs. Unable to wean DBA - He is end-stage and inotrope dependent. Not a good candidate for advanced therapies with lack of support system, and non-citizen status and does not want this  - Continue DBA 5 mcg/kg/min via single lumen PICC. [facility] has been paying for this for 1 month. Will run out Thursday. If cannot get alternative funding source will have to stop. Unable to get insurance due to lack of citizenship. I d/w SW and they said it will cost $30-50/day. He will ask his brother for help. If unable to get help will have to d/c DBA>  - Continue torsemide 40/20  - Continue Farxiga 10 mg daily  - Continue digoxin 0.0625 mg daily. - Continue spiro 12.5 mg daily. - Labs today   2. CKD Stage IIIa - Creatinine baseline 1.4 -  recheck today   3. PVCs - Continue amio 200 mg bid. - Well suppressed. - no change   4. CAD  - Cath with 1-v CAD with chronically occluded RCA - No s/s angina - Continue ASA and atorvastatin.    5. H/o Hypokalemia  - Continue spiro  - Labs today  6. [place] - Hospice through [place] (only option with no insurance and non-citizen) - D/w HFSW. See plan above.   Total time spent 45 minutes. Over half that time spent discussing above.     Glori Bickers, MD  3:29 PM

## 2022-03-09 NOTE — Patient Instructions (Addendum)
Medication Changes:  None, continue current medications  Cost of home Dobutamine will be approximately $1200 per month, please call our office tomorrow and let us know how to proceed.  Lab Work:  Labs done today, your results will be available in MyChart, we will contact you for abnormal readings.  Testing/Procedures:  None  Referrals:  None  Special Instructions // Education:  Do the following things EVERYDAY: Weigh yourself in the morning before breakfast. Write it down and keep it in a log. Take your medicines as prescribed Eat low salt foods--Limit salt (sodium) to 2000 mg per day.  Stay as active as you can everyday Limit all fluids for the day to less than 2 liters   Follow-Up in: 3 months  At the [facility], you and your health needs are our priority. We have a designated team specialized in the treatment of Heart Failure. This Care Team includes your primary Heart Failure Specialized Cardiologist (physician), Advanced Practice Providers (APPs- Physician Assistants and Nurse Practitioners), and Pharmacist who all work together to provide you with the care you need, when you need it.   You may see any of the following providers on your designated Care Team at your next follow up:  Dr Arvilla Meres Dr Carron Curie, NP Robbie Lis, [place] [facility] [place], [place] [name], PharmD   Please be sure to bring in all your medications bottles to every appointment.   Need to Contact us:  If you have any questions or concerns before your next appointment please send [place] a message through [place] or call our office at [phone].    TO LEAVE A MESSAGE FOR THE NURSE SELECT OPTION 2, PLEASE LEAVE A MESSAGE INCLUDING: YOUR NAME DATE OF BIRTH CALL BACK NUMBER REASON FOR CALL**this is important as we prioritize the call backs  YOU WILL RECEIVE A CALL BACK THE SAME DAY AS LONG AS YOU CALL BEFORE 4:00 PM

## 2022-03-10 ENCOUNTER — Other Ambulatory Visit (HOSPITAL_COMMUNITY): Payer: Self-pay

## 2022-03-10 ENCOUNTER — Encounter (HOSPITAL_COMMUNITY): Payer: Self-pay | Admitting: *Deleted

## 2022-03-11 ENCOUNTER — Other Ambulatory Visit (HOSPITAL_COMMUNITY): Payer: Self-pay

## 2022-03-12 ENCOUNTER — Encounter (HOSPITAL_COMMUNITY): Payer: Self-pay

## 2022-03-12 ENCOUNTER — Telehealth (HOSPITAL_COMMUNITY): Payer: Self-pay | Admitting: Licensed Clinical Social Worker

## 2022-03-12 NOTE — Telephone Encounter (Signed)
CSW consulted to help assess options for patient as charity for his medication is running out Monday.  Pt had reported to physician that he was speaking with medicaid case worker about covering medications.  CSW spoke with Dionicia Abler and with [facility] Medicaid case worker Sofie Rower Joseph Art [phone]) regarding his case.  Ms. Joseph Art reports that pt used to get SSDI benefits but stopped due to issue with his immigration status- has been due to turn in required paperwork to ammend this but has not done so which is delaying the processing of his case.  She has reached out to DDS to see if immigration status will affect medicaid determination- awaiting word.  CSW spoke with inpatient [facility] assistant director who will approve 1 month extended LOG for pt while we help to figure this out.  Attempted to call pt to discuss- unable to reach- left VM and text messages  Burna Sis, LCSW Clinical Social Worker [facility] Desk#: [phone] Cell#: [phone]

## 2022-03-22 ENCOUNTER — Other Ambulatory Visit (HOSPITAL_COMMUNITY): Payer: Self-pay

## 2022-03-22 ENCOUNTER — Telehealth (HOSPITAL_COMMUNITY): Payer: Self-pay | Admitting: Licensed Clinical Social Worker

## 2022-03-22 MED ORDER — DAPAGLIFLOZIN PROPANEDIOL 10 MG PO TABS
10.0000 mg | ORAL_TABLET | Freq: Every day | ORAL | 2 refills | Status: DC
  Filled 2022-03-22: qty 30, fill #0
  Filled 2022-03-22 – 2022-03-24 (×2): qty 30, 30d supply, fill #0
  Filled 2022-04-28: qty 30, 30d supply, fill #1

## 2022-03-23 ENCOUNTER — Encounter (HOSPITAL_COMMUNITY): Payer: Self-pay | Admitting: Internal Medicine

## 2022-03-24 ENCOUNTER — Other Ambulatory Visit (HOSPITAL_COMMUNITY): Payer: Self-pay

## 2022-03-29 ENCOUNTER — Encounter (HOSPITAL_COMMUNITY): Payer: Self-pay | Admitting: Internal Medicine

## 2022-04-07 ENCOUNTER — Telehealth (HOSPITAL_COMMUNITY): Payer: Self-pay | Admitting: *Deleted

## 2022-04-07 NOTE — Telephone Encounter (Signed)
Due to insurance [facility] will be discharging pt, called referral into [facility], they will take over and manage pt and his dobutamine

## 2022-04-08 ENCOUNTER — Inpatient Hospital Stay (HOSPITAL_COMMUNITY)
Admission: EM | Admit: 2022-04-08 | Discharge: 2022-04-20 | DRG: 266 | Disposition: A | Discharge status: Home / Self Care | Payer: Medicaid Other | Attending: Internal Medicine | Admitting: Internal Medicine

## 2022-04-08 ENCOUNTER — Emergency Department (HOSPITAL_COMMUNITY): Payer: Medicaid Other

## 2022-04-08 ENCOUNTER — Encounter (HOSPITAL_COMMUNITY): Payer: Self-pay | Admitting: Emergency Medicine

## 2022-04-08 ENCOUNTER — Other Ambulatory Visit: Payer: Self-pay

## 2022-04-08 DIAGNOSIS — I472 Ventricular tachycardia, unspecified: Secondary | ICD-10-CM | POA: Diagnosis present

## 2022-04-08 DIAGNOSIS — R7401 Elevation of levels of liver transaminase levels: Secondary | ICD-10-CM | POA: Diagnosis present

## 2022-04-08 DIAGNOSIS — E782 Mixed hyperlipidemia: Secondary | ICD-10-CM | POA: Diagnosis present

## 2022-04-08 DIAGNOSIS — Z95828 Presence of other vascular implants and grafts: Secondary | ICD-10-CM | POA: Diagnosis not present

## 2022-04-08 DIAGNOSIS — Z8249 Family history of ischemic heart disease and other diseases of the circulatory system: Secondary | ICD-10-CM

## 2022-04-08 DIAGNOSIS — I3139 Other pericardial effusion (noninflammatory): Secondary | ICD-10-CM | POA: Diagnosis present

## 2022-04-08 DIAGNOSIS — I130 Hypertensive heart and chronic kidney disease with heart failure and stage 1 through stage 4 chronic kidney disease, or unspecified chronic kidney disease: Secondary | ICD-10-CM | POA: Diagnosis present

## 2022-04-08 DIAGNOSIS — I5023 Acute on chronic systolic (congestive) heart failure: Secondary | ICD-10-CM | POA: Diagnosis not present

## 2022-04-08 DIAGNOSIS — I493 Ventricular premature depolarization: Secondary | ICD-10-CM | POA: Diagnosis present

## 2022-04-08 DIAGNOSIS — N1832 Chronic kidney disease, stage 3b: Secondary | ICD-10-CM | POA: Diagnosis present

## 2022-04-08 DIAGNOSIS — Z66 Do not resuscitate: Secondary | ICD-10-CM | POA: Diagnosis present

## 2022-04-08 DIAGNOSIS — Z006 Encounter for examination for normal comparison and control in clinical research program: Secondary | ICD-10-CM | POA: Diagnosis not present

## 2022-04-08 DIAGNOSIS — N1831 Chronic kidney disease, stage 3a: Secondary | ICD-10-CM | POA: Diagnosis present

## 2022-04-08 DIAGNOSIS — I428 Other cardiomyopathies: Secondary | ICD-10-CM | POA: Diagnosis present

## 2022-04-08 DIAGNOSIS — Z7189 Other specified counseling: Secondary | ICD-10-CM

## 2022-04-08 DIAGNOSIS — Z789 Other specified health status: Secondary | ICD-10-CM

## 2022-04-08 DIAGNOSIS — J9621 Acute and chronic respiratory failure with hypoxia: Secondary | ICD-10-CM | POA: Diagnosis present

## 2022-04-08 DIAGNOSIS — Z515 Encounter for palliative care: Secondary | ICD-10-CM | POA: Diagnosis not present

## 2022-04-08 DIAGNOSIS — I340 Nonrheumatic mitral (valve) insufficiency: Secondary | ICD-10-CM

## 2022-04-08 DIAGNOSIS — R570 Cardiogenic shock: Secondary | ICD-10-CM | POA: Diagnosis present

## 2022-04-08 DIAGNOSIS — Z20822 Contact with and (suspected) exposure to covid-19: Secondary | ICD-10-CM | POA: Diagnosis present

## 2022-04-08 DIAGNOSIS — I272 Pulmonary hypertension, unspecified: Secondary | ICD-10-CM | POA: Diagnosis present

## 2022-04-08 DIAGNOSIS — E870 Hyperosmolality and hypernatremia: Secondary | ICD-10-CM | POA: Diagnosis present

## 2022-04-08 DIAGNOSIS — I255 Ischemic cardiomyopathy: Secondary | ICD-10-CM | POA: Diagnosis present

## 2022-04-08 DIAGNOSIS — I5084 End stage heart failure: Secondary | ICD-10-CM | POA: Diagnosis present

## 2022-04-08 DIAGNOSIS — Z9981 Dependence on supplemental oxygen: Secondary | ICD-10-CM

## 2022-04-08 DIAGNOSIS — Z91013 Allergy to seafood: Secondary | ICD-10-CM | POA: Diagnosis not present

## 2022-04-08 DIAGNOSIS — I2510 Atherosclerotic heart disease of native coronary artery without angina pectoris: Secondary | ICD-10-CM | POA: Diagnosis present

## 2022-04-08 DIAGNOSIS — Z597 Insufficient social insurance and welfare support: Secondary | ICD-10-CM

## 2022-04-08 DIAGNOSIS — N189 Chronic kidney disease, unspecified: Secondary | ICD-10-CM | POA: Diagnosis not present

## 2022-04-08 DIAGNOSIS — I5043 Acute on chronic combined systolic (congestive) and diastolic (congestive) heart failure: Principal | ICD-10-CM

## 2022-04-08 DIAGNOSIS — Z955 Presence of coronary angioplasty implant and graft: Secondary | ICD-10-CM

## 2022-04-08 DIAGNOSIS — Z608 Other problems related to social environment: Secondary | ICD-10-CM | POA: Diagnosis present

## 2022-04-08 DIAGNOSIS — Z7982 Long term (current) use of aspirin: Secondary | ICD-10-CM

## 2022-04-08 DIAGNOSIS — I083 Combined rheumatic disorders of mitral, aortic and tricuspid valves: Secondary | ICD-10-CM | POA: Diagnosis not present

## 2022-04-08 DIAGNOSIS — Z9889 Other specified postprocedural states: Secondary | ICD-10-CM

## 2022-04-08 DIAGNOSIS — I110 Hypertensive heart disease with heart failure: Secondary | ICD-10-CM | POA: Diagnosis not present

## 2022-04-08 DIAGNOSIS — T502X5A Adverse effect of carbonic-anhydrase inhibitors, benzothiadiazides and other diuretics, initial encounter: Secondary | ICD-10-CM | POA: Diagnosis not present

## 2022-04-08 DIAGNOSIS — R778 Other specified abnormalities of plasma proteins: Secondary | ICD-10-CM | POA: Diagnosis not present

## 2022-04-08 DIAGNOSIS — Z91018 Allergy to other foods: Secondary | ICD-10-CM

## 2022-04-08 DIAGNOSIS — Z8041 Family history of malignant neoplasm of ovary: Secondary | ICD-10-CM

## 2022-04-08 DIAGNOSIS — R638 Other symptoms and signs concerning food and fluid intake: Secondary | ICD-10-CM | POA: Diagnosis not present

## 2022-04-08 DIAGNOSIS — N179 Acute kidney failure, unspecified: Secondary | ICD-10-CM | POA: Diagnosis present

## 2022-04-08 DIAGNOSIS — Z79899 Other long term (current) drug therapy: Secondary | ICD-10-CM

## 2022-04-08 DIAGNOSIS — N170 Acute kidney failure with tubular necrosis: Secondary | ICD-10-CM | POA: Diagnosis not present

## 2022-04-08 DIAGNOSIS — J9611 Chronic respiratory failure with hypoxia: Secondary | ICD-10-CM | POA: Diagnosis not present

## 2022-04-08 DIAGNOSIS — Z954 Presence of other heart-valve replacement: Secondary | ICD-10-CM | POA: Diagnosis not present

## 2022-04-08 DIAGNOSIS — I5022 Chronic systolic (congestive) heart failure: Secondary | ICD-10-CM | POA: Diagnosis not present

## 2022-04-08 DIAGNOSIS — Z602 Problems related to living alone: Secondary | ICD-10-CM | POA: Diagnosis present

## 2022-04-08 DIAGNOSIS — Z91199 Patient's noncompliance with other medical treatment and regimen due to unspecified reason: Secondary | ICD-10-CM

## 2022-04-08 LAB — CBC WITH DIFFERENTIAL/PLATELET
Abs Immature Granulocytes: 0.09 10*3/uL — ABNORMAL HIGH (ref 0.00–0.07)
Basophils Absolute: 0 10*3/uL (ref 0.0–0.1)
Basophils Relative: 0 %
Eosinophils Absolute: 0 10*3/uL (ref 0.0–0.5)
Eosinophils Relative: 0 %
HCT: 38.2 % — ABNORMAL LOW (ref 39.0–52.0)
Hemoglobin: 12.2 g/dL — ABNORMAL LOW (ref 13.0–17.0)
Immature Granulocytes: 1 %
Lymphocytes Relative: 15 %
Lymphs Abs: 1.4 10*3/uL (ref 0.7–4.0)
MCH: 27.4 pg (ref 26.0–34.0)
MCHC: 31.9 g/dL (ref 30.0–36.0)
MCV: 85.8 fL (ref 80.0–100.0)
Monocytes Absolute: 0.8 10*3/uL (ref 0.1–1.0)
Monocytes Relative: 8 %
Neutro Abs: 7.2 10*3/uL (ref 1.7–7.7)
Neutrophils Relative %: 76 %
Platelets: 282 10*3/uL (ref 150–400)
RBC: 4.45 MIL/uL (ref 4.22–5.81)
RDW: 17.2 % — ABNORMAL HIGH (ref 11.5–15.5)
WBC: 9.6 10*3/uL (ref 4.0–10.5)
nRBC: 0.2 % (ref 0.0–0.2)

## 2022-04-08 LAB — SARS CORONAVIRUS 2 BY RT PCR: SARS Coronavirus 2 by RT PCR: NEGATIVE

## 2022-04-08 LAB — BASIC METABOLIC PANEL
Anion gap: 14 (ref 5–15)
BUN: 24 mg/dL — ABNORMAL HIGH (ref 6–20)
CO2: 20 mmol/L — ABNORMAL LOW (ref 22–32)
Calcium: 9 mg/dL (ref 8.9–10.3)
Chloride: 109 mmol/L (ref 98–111)
Creatinine, Ser: 1.82 mg/dL — ABNORMAL HIGH (ref 0.61–1.24)
GFR, Estimated: 42 mL/min — ABNORMAL LOW (ref >60)
Glucose, Bld: 139 mg/dL — ABNORMAL HIGH (ref 70–99)
Potassium: 4.2 mmol/L (ref 3.5–5.1)
Sodium: 143 mmol/L (ref 135–145)

## 2022-04-08 LAB — BRAIN NATRIURETIC PEPTIDE: B Natriuretic Peptide: 3236 pg/mL — ABNORMAL HIGH (ref 0.0–100.0)

## 2022-04-08 LAB — TROPONIN I (HIGH SENSITIVITY)
Troponin I (High Sensitivity): 28 ng/L — ABNORMAL HIGH (ref <18)
Troponin I (High Sensitivity): 33 ng/L — ABNORMAL HIGH (ref <18)

## 2022-04-08 MED ORDER — FUROSEMIDE 10 MG/ML IJ SOLN
40.0000 mg | Freq: Once | INTRAMUSCULAR | Status: AC
  Administered 2022-04-09: 40 mg via INTRAVENOUS
  Filled 2022-04-08: qty 4

## 2022-04-08 MED ORDER — FUROSEMIDE 10 MG/ML IJ SOLN
20.0000 mg | Freq: Once | INTRAMUSCULAR | Status: AC
  Administered 2022-04-08: 20 mg via INTRAVENOUS
  Filled 2022-04-08: qty 2

## 2022-04-08 MED ORDER — DOBUTAMINE IN D5W 4-5 MG/ML-% IV SOLN
5.0000 ug/kg/min | INTRAVENOUS | Status: DC
  Administered 2022-04-09 – 2022-04-14 (×4): 5 ug/kg/min via INTRAVENOUS
  Filled 2022-04-08 (×4): qty 250

## 2022-04-08 MED ORDER — FUROSEMIDE 10 MG/ML IJ SOLN
60.0000 mg | Freq: Two times a day (BID) | INTRAMUSCULAR | Status: DC
Start: 2022-04-09 — End: 2022-04-09
  Administered 2022-04-09: 60 mg via INTRAVENOUS
  Filled 2022-04-08: qty 6

## 2022-04-08 NOTE — ED Provider Notes (Signed)
[facility] EMERGENCY DEPARTMENT Provider Note   CSN: [id number] Arrival date & time: 04/08/22  1923     History  Chief Complaint  Patient presents with   Shortness of Breath    Nain Rudd is a 61 y.o. male.  The history is provided by the patient and medical records. No language interpreter was used.  Shortness of Breath   61 year old male sending history of congestive heart failure with EF 25-30%, CAD, hypertension currently on dobutamine infusion brought here via EMS from home with complaints of generalized weakness. Patient is currently under hospice care.  Patient report for the past 3 to 4 days he has been having increased generalized weakness and shortness of breath.  He is normally on 2 L of oxygen but has to increase it to 3 L.  Weakness is prominent with any kind exertion.  No fever or chills no productive cough no nausea vomiting or diarrhea.  He is currently on a dobutamine drip for his low blood pressure.  Does have extensive family history of cardiac disease requiring heart transplant.  Patient is being cared for by cardiologist Dr. Gala Romney.  Patient denies any recent medication changes  Home Medications Prior to Admission medications   Medication Sig Start Date End Date Taking? Authorizing Provider  amiodarone (PACERONE) 200 MG tablet Take 1 tablet (200 mg total) by mouth 2 (two) times daily. 12/21/21   Bensimhon, Bevelyn Buckles, MD  ASPIRIN 81 PO Take 81 mg by mouth daily.    [provider]  atorvastatin (LIPITOR) 40 MG tablet Take 1 tablet (40 mg total) by mouth daily. 11/25/21   Robbie Lis M, PA-C  dapagliflozin propanediol (FARXIGA) 10 MG TABS tablet Take 1 tablet (10 mg total) by mouth daily for the heart. 03/22/22   Bensimhon, Bevelyn Buckles, MD  digoxin (LANOXIN) 0.125 MG tablet Take 1/2 tablets (0.0625 mg total) by mouth daily. 03/08/22   Bensimhon, Bevelyn Buckles, MD  DOBUTamine (DOBUTREX) 4-5 MG/ML-% infusion Inject 344.5 mcg/min into the  vein continuous. 02/04/22   Allayne Butcher, PA-C  Multiple Vitamin (MULTIVITAMIN) capsule Take 1 capsule by mouth daily.    [provider]  potassium chloride SA (KLOR-CON M) 20 MEQ tablet Take 2 tablets (40 mEq total) by mouth daily. 02/17/22   Jacklynn Ganong, FNP  spironolactone (ALDACTONE) 25 MG tablet Take 1/2 tablet (12.5 mg total) by mouth daily. 02/04/22   Robbie Lis M, PA-C  torsemide (DEMADEX) 20 MG tablet 2 tablets by mouth every morning and 1 tablet by mouth every evening.    [provider]      Allergies    Avocado and Shellfish allergy    Review of Systems   Review of Systems  Respiratory:  Positive for shortness of breath.   All other systems reviewed and are negative.   Physical Exam Updated Vital Signs BP 99/76 (BP Location: Left Arm)   Pulse 87   Temp 97.7 F (36.5 C) (Oral)   Resp 18   SpO2 99%  Physical Exam Vitals and nursing note reviewed.  Constitutional:      General: He is not in acute distress.    Appearance: He is well-developed.     Comments: Chronically ill-appearing appears to be in no acute distress  HENT:     Head: Atraumatic.  Eyes:     Conjunctiva/sclera: Conjunctivae normal.  Cardiovascular:     Rate and Rhythm: Normal rate and regular rhythm.     Heart sounds: Murmur heard.  Pulmonary:     Breath sounds: No wheezing, rhonchi or rales.  Abdominal:     Palpations: Abdomen is soft.  Musculoskeletal:        General: No swelling.     Cervical back: Neck supple.     Right lower leg: Edema present.     Left lower leg: Edema present.  Skin:    Findings: No rash.  Neurological:     Mental Status: He is alert and oriented to person, place, and time.  Psychiatric:        Mood and Affect: Mood normal.     ED Results / Procedures / Treatments   Labs (all labs ordered are listed, but only abnormal results are displayed) Labs Reviewed  BASIC METABOLIC PANEL - Abnormal; Notable for the following  components:      Result Value   CO2 20 (*)    Glucose, Bld 139 (*)    BUN 24 (*)    Creatinine, Ser 1.82 (*)    GFR, Estimated 42 (*)    All other components within normal limits  CBC WITH DIFFERENTIAL/PLATELET - Abnormal; Notable for the following components:   Hemoglobin 12.2 (*)    HCT 38.2 (*)    RDW 17.2 (*)    Abs Immature Granulocytes 0.09 (*)    All other components within normal limits  BRAIN NATRIURETIC PEPTIDE - Abnormal; Notable for the following components:   B Natriuretic Peptide 3,236.0 (*)    All other components within normal limits  TROPONIN I (HIGH SENSITIVITY) - Abnormal; Notable for the following components:   Troponin I (High Sensitivity) 28 (*)    All other components within normal limits  SARS CORONAVIRUS 2 BY RT PCR  TROPONIN I (HIGH SENSITIVITY)    EKG EKG Interpretation  Date/Time:  Thursday April 08 2022 19:27:57 EDT Ventricular Rate:  87 PR Interval:  180 QRS Duration: 103 QT Interval:  479 QTC Calculation: 577 R Axis:   64 Text Interpretation: Sinus rhythm Biatrial enlargement Probable anteroseptal infarct, old Repol abnrm suggests ischemia, lateral leads Prolonged QT interval No significant change since last tracing Confirmed by Richardean Canal [phone]) on 04/08/2022 10:18:55 PM  Radiology No results found.  Procedures .Critical Care  Performed by: Fayrene Helper, PA-C Authorized by: Fayrene Helper, PA-C   Critical care provider statement:    Critical care time (minutes):  80   Critical care was time spent personally by me on the following activities:  Development of treatment plan with patient or surrogate, discussions with consultants, evaluation of patient's response to treatment, examination of patient, ordering and review of laboratory studies, ordering and review of radiographic studies, ordering and performing treatments and interventions, pulse oximetry, re-evaluation of patient's condition and review of old charts     Medications Ordered  in ED Medications  furosemide (LASIX) injection 40 mg (has no administration in time range)    Followed by  furosemide (LASIX) injection 60 mg (has no administration in time range)  DOBUTamine (DOBUTREX) infusion 4000 mcg/mL (has no administration in time range)  furosemide (LASIX) injection 20 mg (20 mg Intravenous Given 04/08/22 2316)    ED Course/ Medical Decision Making/ A&P                           Medical Decision Making Amount and/or Complexity of Data Reviewed Labs: ordered. Radiology: ordered.  Risk Prescription drug management. Decision regarding hospitalization.   BP 99/76 (BP Location: Left Arm)  Pulse 87   Temp 97.7 F (36.5 C) (Oral)   Resp 18   SpO2 99%   7:45 PM This is a 61 year old male with significant history of chronic systolic heart failure with an EF of 25-30%, history of cardiomyopathy with familial component, history of cardiogenic shock currently on dobutamine drips who presents with progressive worsening generalized fatigue as well as shortness of breath for the past several days.  Does not endorse any infectious symptoms such as fever, productive cough or body aches.  Patient just felt like he is much weaker than usual.  He is having to use 3 L of supplemental oxygen at home instead of 2 L for comfort.  He does not notice worsening fluid retention.  On exam, patient is resting comfortably appears to be in no acute discomfort.  He is on supplemental oxygen at 2 L satting at 99%.  Blood pressure is 99/76.  He is afebrile.  He is not tachycardic.  Work-up initiated.  10:51 PM Labs and imaging along with EKG obtained independently viewed interpreted by me and I agree with radiologist interpretation.  Chest x-ray shows cardiomegaly with vascular congestion but no focal infiltrate.  Labs remarkable for elevated BNP of 3236.  Mildly elevated troponin of 28 likely demand ischemia.  Creatinine is 1.82.  Normal WBC.  EKG not consistent with STEMI  Since patient  is on dobutamine drip for severe heart failure and is having worsening signs of heart failure requiring treatment, I have ordered 20 mg of Lasix, I have also consulted with pulmonary critical care specialist Dr. Tonia Brooms who felt patient should be admitted by cardiology team for further management of his condition.  We will consult cardiology for admission.  11:14 PM I have consulted on-call cardiologist who noted that patient is under hospice/palliative care and therefore palliative care should be consulted for further management of his condition.  I have reached out to [facility] and left message to be consulted, will await return call.   11:26 PM I have consulted with hospice care nurse who states that patient has not been officially managed by them and therefore they do not have any protocol care available for them.  I have re-consulted cardiology team who request medicine for admission.   12:01 AM Appreciate consultation from [facility] Dr. Leafy Half who agrees to see and will admit pt but he request cardiology team to also be involve in pt care and give recommendation on dobutamine management.  I have paged cardiologist Dr. Daphine Deutscher with the request.    This patient presents to the ED for concern of sob, this involves an extensive number of treatment options, and is a complaint that carries with it a high risk of complications and morbidity.  The differential diagnosis includes CHF exacerbation, COPD, asthma, pleural effusion, pna, ptx, anemia, acs  Co morbidities that complicate the patient evaluation CHF Additional history obtained:  Additional history obtained from EMS External records from outside source obtained and reviewed including EMR along with prior labs and imaging  Lab Tests:  I Ordered, and personally interpreted labs.  The pertinent results include:  as above  Imaging Studies ordered:  I ordered imaging studies including CXR I independently visualized and interpreted  imaging which showed vascular congestion I agree with the radiologist interpretation  Cardiac Monitoring:  The patient was maintained on a cardiac monitor.  I personally viewed and interpreted the cardiac monitored which showed an underlying rhythm of: NSR  Medicines ordered and prescription drug management:  I ordered  medication including lasix  for CHF exacerbation Reevaluation of the patient after these medicines showed that the patient improved I have reviewed the patients home medicines and have made adjustments as needed  Test Considered: as above  Critical Interventions: as above  Consultations Obtained:  I requested consultation with the hospitalist Dr. Leafy Half,  and discussed lab and imaging findings as well as pertinent plan - they recommend: admission  Problem List / ED Course: CHF exacerbation  Reevaluation:  After the interventions noted above, I reevaluated the patient and found that they have :improved  Social Determinants of Health: lack of insurance  Lack of financial support  Dispostion:  After consideration of the diagnostic results and the patients response to treatment, I feel that the patent would benefit from admission.         Final Clinical Impression(s) / ED Diagnoses Final diagnoses:  Acute on chronic combined systolic and diastolic congestive heart failure [facility])    Rx / DC Orders ED Discharge Orders     None         Fayrene Helper, PA-C 04/09/22 1559    Charlynne Pander, MD 04/09/22 1620

## 2022-04-08 NOTE — ED Triage Notes (Signed)
Pt via [facility] from home c/o generalized weakness x several days, increased SHOB today. Dobutamine infusion, 2L [place] at baseline, up to 3L . Pt A&O4, denies CP, additional symptoms   80/60, NS to 96/66 [facility]

## 2022-04-08 NOTE — ED Provider Notes (Incomplete)
[facility] EMERGENCY DEPARTMENT Provider Note   CSN: [id number] Arrival date & time: 04/08/22  1923     History {Add pertinent medical, surgical, social history, OB history to HPI:1} Chief Complaint  Patient presents with  . Shortness of Breath    Zachary Arias is a 61 y.o. male.  The history is provided by the patient and medical records. No language interpreter was used.  Shortness of Breath   61 year old male sending history of congestive heart failure with EF 25-30%, CAD, hypertension currently on dobutamine infusion brought here via EMS from home with complaints of generalized weakness. Patient is currently under hospice care.  Patient report for the past 3 to 4 days he has been having increased generalized weakness and shortness of breath.  He is normally on 2 L of oxygen but has to increase it to 3 L.  Weakness is prominent with any kind exertion.  No fever or chills no productive cough no nausea vomiting or diarrhea.  He is currently on a dobutamine drip for his low blood pressure.  Does have extensive family history of cardiac disease requiring heart transplant.  Patient is being cared for by cardiologist Dr. Gala Romney.  Patient denies any recent medication changes  Home Medications Prior to Admission medications   Medication Sig Start Date End Date Taking? Authorizing Provider  amiodarone (PACERONE) 200 MG tablet Take 1 tablet (200 mg total) by mouth 2 (two) times daily. 12/21/21   Bensimhon, Bevelyn Buckles, MD  ASPIRIN 81 PO Take 81 mg by mouth daily.    [provider]  atorvastatin (LIPITOR) 40 MG tablet Take 1 tablet (40 mg total) by mouth daily. 11/25/21   Robbie Lis M, PA-C  dapagliflozin propanediol (FARXIGA) 10 MG TABS tablet Take 1 tablet (10 mg total) by mouth daily for the heart. 03/22/22   Bensimhon, Bevelyn Buckles, MD  digoxin (LANOXIN) 0.125 MG tablet Take 1/2 tablets (0.0625 mg total) by mouth daily. 03/08/22   Bensimhon, Bevelyn Buckles, MD   DOBUTamine (DOBUTREX) 4-5 MG/ML-% infusion Inject 344.5 mcg/min into the vein continuous. 02/04/22   Allayne Butcher, PA-C  Multiple Vitamin (MULTIVITAMIN) capsule Take 1 capsule by mouth daily.    [provider]  potassium chloride SA (KLOR-CON M) 20 MEQ tablet Take 2 tablets (40 mEq total) by mouth daily. 02/17/22   Jacklynn Ganong, FNP  spironolactone (ALDACTONE) 25 MG tablet Take 1/2 tablet (12.5 mg total) by mouth daily. 02/04/22   Robbie Lis M, PA-C  torsemide (DEMADEX) 20 MG tablet 2 tablets by mouth every morning and 1 tablet by mouth every evening.    [provider]      Allergies    Avocado and Shellfish allergy    Review of Systems   Review of Systems  Respiratory:  Positive for shortness of breath.   All other systems reviewed and are negative.   Physical Exam Updated Vital Signs BP 99/76 (BP Location: Left Arm)   Pulse 87   Temp 97.7 F (36.5 C) (Oral)   Resp 18   SpO2 99%  Physical Exam Vitals and nursing note reviewed.  Constitutional:      General: He is not in acute distress.    Appearance: He is well-developed.     Comments: Chronically ill-appearing appears to be in no acute distress  HENT:     Head: Atraumatic.  Eyes:     Conjunctiva/sclera: Conjunctivae normal.  Cardiovascular:     Rate and Rhythm: Normal rate and regular rhythm.  Heart sounds: Murmur heard.  Pulmonary:     Breath sounds: No wheezing, rhonchi or rales.  Abdominal:     Palpations: Abdomen is soft.  Musculoskeletal:        General: No swelling.     Cervical back: Neck supple.     Right lower leg: Edema present.     Left lower leg: Edema present.  Skin:    Findings: No rash.  Neurological:     Mental Status: He is alert and oriented to person, place, and time.  Psychiatric:        Mood and Affect: Mood normal.     ED Results / Procedures / Treatments   Labs (all labs ordered are listed, but only abnormal results are displayed) Labs  Reviewed  BASIC METABOLIC PANEL - Abnormal; Notable for the following components:      Result Value   CO2 20 (*)    Glucose, Bld 139 (*)    BUN 24 (*)    Creatinine, Ser 1.82 (*)    GFR, Estimated 42 (*)    All other components within normal limits  CBC WITH DIFFERENTIAL/PLATELET - Abnormal; Notable for the following components:   Hemoglobin 12.2 (*)    HCT 38.2 (*)    RDW 17.2 (*)    Abs Immature Granulocytes 0.09 (*)    All other components within normal limits  BRAIN NATRIURETIC PEPTIDE - Abnormal; Notable for the following components:   B Natriuretic Peptide 3,236.0 (*)    All other components within normal limits  TROPONIN I (HIGH SENSITIVITY) - Abnormal; Notable for the following components:   Troponin I (High Sensitivity) 28 (*)    All other components within normal limits  SARS CORONAVIRUS 2 BY RT PCR  TROPONIN I (HIGH SENSITIVITY)    EKG EKG Interpretation  Date/Time:  Thursday April 08 2022 19:27:57 EDT Ventricular Rate:  87 PR Interval:  180 QRS Duration: 103 QT Interval:  479 QTC Calculation: 577 R Axis:   64 Text Interpretation: Sinus rhythm Biatrial enlargement Probable anteroseptal infarct, old Repol abnrm suggests ischemia, lateral leads Prolonged QT interval No significant change since last tracing Confirmed by Richardean Canal [id number]) on 04/08/2022 10:18:55 PM  Radiology No results found.  Procedures .Critical Care  Performed by: Fayrene Helper, PA-C Authorized by: Fayrene Helper, PA-C   Critical care provider statement:    Critical care time (minutes):  80   Critical care was time spent personally by me on the following activities:  Development of treatment plan with patient or surrogate, discussions with consultants, evaluation of patient's response to treatment, examination of patient, ordering and review of laboratory studies, ordering and review of radiographic studies, ordering and performing treatments and interventions, pulse oximetry, re-evaluation of  patient's condition and review of old charts   {Document cardiac monitor, telemetry assessment procedure when appropriate:1}  Medications Ordered in ED Medications  furosemide (LASIX) injection 40 mg (has no administration in time range)    Followed by  furosemide (LASIX) injection 60 mg (has no administration in time range)  DOBUTamine (DOBUTREX) infusion 4000 mcg/mL (has no administration in time range)  furosemide (LASIX) injection 20 mg (20 mg Intravenous Given 04/08/22 2316)    ED Course/ Medical Decision Making/ A&P                           Medical Decision Making Amount and/or Complexity of Data Reviewed Labs: ordered. Radiology: ordered.  Risk Prescription drug management.  BP 99/76 (BP Location: Left Arm)   Pulse 87   Temp 97.7 F (36.5 C) (Oral)   Resp 18   SpO2 99%   7:45 PM This is a 61 year old male with significant history of chronic systolic heart failure with an EF of 25-30%, history of cardiomyopathy with familial component, history of cardiogenic shock currently on dobutamine drips who presents with progressive worsening generalized fatigue as well as shortness of breath for the past several days.  Does not endorse any infectious symptoms such as fever, productive cough or body aches.  Patient just felt like he is much weaker than usual.  He is having to use 3 L of supplemental oxygen at home instead of 2 L for comfort.  He does not notice worsening fluid retention.  On exam, patient is resting comfortably appears to be in no acute discomfort.  He is on supplemental oxygen at 2 L satting at 99%.  Blood pressure is 99/76.  He is afebrile.  He is not tachycardic.  Work-up initiated.  10:51 PM Labs and imaging along with EKG obtained independently viewed interpreted by me and I agree with radiologist interpretation.  Chest x-ray shows cardiomegaly with vascular congestion but no focal infiltrate.  Labs remarkable for elevated BNP of 3236.  Mildly elevated  troponin of 28 likely demand ischemia.  Creatinine is 1.82.  Normal WBC.  EKG not consistent with STEMI  Since patient is on dobutamine drip for severe heart failure and is having worsening signs of heart failure requiring treatment, I have ordered 20 mg of Lasix, I have also consulted with pulmonary critical care specialist Dr. Tonia Brooms who felt patient should be admitted by cardiology team for further management of his condition.  We will consult cardiology for admission.  11:14 PM I have consulted on-call cardiologist who noted that patient is under hospice/palliative care and therefore palliative care should be consulted for further management of his condition.  I have reached out to [facility] and left message to be consulted, will await return call.   11:26 PM I have consulted with hospice care nurse who states that patient has not been officially managed by them and therefore they do not have any protocol care available for them.  I have re-consulted cardiology team who request medicine for admission.   12:01 AM Appreciate consultation from [facility] Dr. Leafy Half who agrees to see and will admit pt but he request cardiology team to also be involve in pt care and give recommendation on dobutamine management.  I have paged cardiologist Dr. Daphine Deutscher with the request.    This patient presents to the ED for concern of sob, this involves an extensive number of treatment options, and is a complaint that carries with it a high risk of complications and morbidity.  The differential diagnosis includes CHF exacerbation, COPD, asthma, pleural effusion, pna, ptx, anemia, acs  Co morbidities that complicate the patient evaluation .CHF Additional history obtained:  Additional history obtained from EMS External records from outside source obtained and reviewed including EMR along with prior labs and imaging  Lab Tests:  I Ordered, and personally interpreted labs.  The pertinent results include:   as above  Imaging Studies ordered:  I ordered imaging studies including CXR I independently visualized and interpreted imaging which showed vascular congestion I agree with the radiologist interpretation  Cardiac Monitoring:  .The patient was maintained on a cardiac monitor.  I personally viewed and interpreted the cardiac monitored which showed an underlying rhythm of: NSR  Medicines  ordered and prescription drug management:  I ordered medication including lasix  for CHF exacerbation Reevaluation of the patient after these medicines showed that the patient improved I have reviewed the patients home medicines and have made adjustments as needed  Test Considered: .as above  Critical Interventions: .as above  Consultations Obtained:  I requested consultation with the ***,  and discussed lab and imaging findings as well as pertinent plan - they recommend: ***  Problem List / ED Course: .***  Reevaluation:  After the interventions noted above, I reevaluated the patient and found that they have :{resolved/improved/worsened:23923::"improved"}  Social Determinants of Health: .***  Dispostion:  After consideration of the diagnostic results and the patients response to treatment, I feel that the patent would benefit from ***.   {Document critical care time when appropriate:1} {Document review of labs and clinical decision tools ie heart score, Chads2Vasc2 etc:1}  {Document your independent review of radiology images, and any outside records:1} {Document your discussion with family members, caretakers, and with consultants:1} {Document social determinants of health affecting pt's care:1} {Document your decision making why or why not admission, treatments were needed:1} Final Clinical Impression(s) / ED Diagnoses Final diagnoses:  None    Rx / DC Orders ED Discharge Orders     None

## 2022-04-09 ENCOUNTER — Inpatient Hospital Stay (HOSPITAL_COMMUNITY): Payer: Medicaid Other

## 2022-04-09 ENCOUNTER — Encounter (HOSPITAL_COMMUNITY): Payer: Self-pay | Admitting: Internal Medicine

## 2022-04-09 DIAGNOSIS — I2510 Atherosclerotic heart disease of native coronary artery without angina pectoris: Secondary | ICD-10-CM

## 2022-04-09 DIAGNOSIS — I5023 Acute on chronic systolic (congestive) heart failure: Secondary | ICD-10-CM

## 2022-04-09 DIAGNOSIS — N1832 Chronic kidney disease, stage 3b: Secondary | ICD-10-CM

## 2022-04-09 DIAGNOSIS — Z7189 Other specified counseling: Secondary | ICD-10-CM

## 2022-04-09 DIAGNOSIS — J9611 Chronic respiratory failure with hypoxia: Secondary | ICD-10-CM | POA: Diagnosis not present

## 2022-04-09 DIAGNOSIS — Z789 Other specified health status: Secondary | ICD-10-CM

## 2022-04-09 DIAGNOSIS — Z515 Encounter for palliative care: Secondary | ICD-10-CM

## 2022-04-09 DIAGNOSIS — N179 Acute kidney failure, unspecified: Secondary | ICD-10-CM | POA: Diagnosis present

## 2022-04-09 DIAGNOSIS — E782 Mixed hyperlipidemia: Secondary | ICD-10-CM

## 2022-04-09 DIAGNOSIS — J9621 Acute and chronic respiratory failure with hypoxia: Secondary | ICD-10-CM | POA: Diagnosis present

## 2022-04-09 DIAGNOSIS — R778 Other specified abnormalities of plasma proteins: Secondary | ICD-10-CM

## 2022-04-09 LAB — COOXEMETRY PANEL
Carboxyhemoglobin: 0.9 % (ref 0.5–1.5)
Methemoglobin: <0.7 % (ref 0.0–1.5)
O2 Saturation: 76 %
Total hemoglobin: 12.9 g/dL (ref 12.0–16.0)

## 2022-04-09 LAB — COMPREHENSIVE METABOLIC PANEL
ALT: 226 U/L — ABNORMAL HIGH (ref 0–44)
AST: 94 U/L — ABNORMAL HIGH (ref 15–41)
Albumin: 3.3 g/dL — ABNORMAL LOW (ref 3.5–5.0)
Alkaline Phosphatase: 139 U/L — ABNORMAL HIGH (ref 38–126)
Anion gap: 13 (ref 5–15)
BUN: 22 mg/dL — ABNORMAL HIGH (ref 6–20)
CO2: 23 mmol/L (ref 22–32)
Calcium: 8.7 mg/dL — ABNORMAL LOW (ref 8.9–10.3)
Chloride: 109 mmol/L (ref 98–111)
Creatinine, Ser: 1.65 mg/dL — ABNORMAL HIGH (ref 0.61–1.24)
GFR, Estimated: 48 mL/min — ABNORMAL LOW (ref >60)
Glucose, Bld: 115 mg/dL — ABNORMAL HIGH (ref 70–99)
Potassium: 3.8 mmol/L (ref 3.5–5.1)
Sodium: 145 mmol/L (ref 135–145)
Total Bilirubin: 1.7 mg/dL — ABNORMAL HIGH (ref 0.3–1.2)
Total Protein: 5.6 g/dL — ABNORMAL LOW (ref 6.5–8.1)

## 2022-04-09 LAB — CBC WITH DIFFERENTIAL/PLATELET
Abs Immature Granulocytes: 0.06 10*3/uL (ref 0.00–0.07)
Basophils Absolute: 0 10*3/uL (ref 0.0–0.1)
Basophils Relative: 0 %
Eosinophils Absolute: 0 10*3/uL (ref 0.0–0.5)
Eosinophils Relative: 0 %
HCT: 38.2 % — ABNORMAL LOW (ref 39.0–52.0)
Hemoglobin: 12.5 g/dL — ABNORMAL LOW (ref 13.0–17.0)
Immature Granulocytes: 1 %
Lymphocytes Relative: 18 %
Lymphs Abs: 1.7 10*3/uL (ref 0.7–4.0)
MCH: 28.3 pg (ref 26.0–34.0)
MCHC: 32.7 g/dL (ref 30.0–36.0)
MCV: 86.6 fL (ref 80.0–100.0)
Monocytes Absolute: 0.9 10*3/uL (ref 0.1–1.0)
Monocytes Relative: 9 %
Neutro Abs: 7 10*3/uL (ref 1.7–7.7)
Neutrophils Relative %: 72 %
Platelets: 269 10*3/uL (ref 150–400)
RBC: 4.41 MIL/uL (ref 4.22–5.81)
RDW: 17.2 % — ABNORMAL HIGH (ref 11.5–15.5)
WBC: 9.8 10*3/uL (ref 4.0–10.5)
nRBC: 0 % (ref 0.0–0.2)

## 2022-04-09 LAB — DIGOXIN LEVEL: Digoxin Level: 0.7 ng/mL — ABNORMAL LOW (ref 0.8–2.0)

## 2022-04-09 LAB — MAGNESIUM: Magnesium: 2.5 mg/dL — ABNORMAL HIGH (ref 1.7–2.4)

## 2022-04-09 MED ORDER — LORAZEPAM 0.5 MG PO TABS
0.5000 mg | ORAL_TABLET | Freq: Four times a day (QID) | ORAL | Status: DC | PRN
Start: 2022-04-09 — End: 2022-04-20
  Administered 2022-04-09 – 2022-04-19 (×11): 0.5 mg via ORAL
  Filled 2022-04-09 (×11): qty 1

## 2022-04-09 MED ORDER — ASPIRIN 81 MG PO TBEC
81.0000 mg | DELAYED_RELEASE_TABLET | Freq: Every day | ORAL | Status: DC
  Administered 2022-04-09 – 2022-04-20 (×10): 81 mg via ORAL
  Filled 2022-04-09 (×11): qty 1

## 2022-04-09 MED ORDER — AMIODARONE HCL 200 MG PO TABS
200.0000 mg | ORAL_TABLET | Freq: Two times a day (BID) | ORAL | Status: DC
  Administered 2022-04-09 – 2022-04-19 (×20): 200 mg via ORAL
  Filled 2022-04-09 (×20): qty 1

## 2022-04-09 MED ORDER — CHLORHEXIDINE GLUCONATE CLOTH 2 % EX PADS
6.0000 | MEDICATED_PAD | Freq: Every day | CUTANEOUS | Status: DC
  Administered 2022-04-09 – 2022-04-20 (×12): 6 via TOPICAL

## 2022-04-09 MED ORDER — ATORVASTATIN CALCIUM 40 MG PO TABS
40.0000 mg | ORAL_TABLET | Freq: Every day | ORAL | Status: DC
  Administered 2022-04-09 – 2022-04-20 (×11): 40 mg via ORAL
  Filled 2022-04-09 (×11): qty 1

## 2022-04-09 MED ORDER — ACETAMINOPHEN 650 MG RE SUPP: 650.0000 mg | Freq: Four times a day (QID) | RECTAL | Status: DC | PRN

## 2022-04-09 MED ORDER — SPIRONOLACTONE 12.5 MG HALF TABLET
12.5000 mg | ORAL_TABLET | Freq: Every day | ORAL | Status: DC
  Administered 2022-04-09 – 2022-04-19 (×9): 12.5 mg via ORAL
  Filled 2022-04-09 (×10): qty 1

## 2022-04-09 MED ORDER — FUROSEMIDE 10 MG/ML IJ SOLN
80.0000 mg | Freq: Two times a day (BID) | INTRAMUSCULAR | Status: DC
  Administered 2022-04-09 – 2022-04-10 (×2): 80 mg via INTRAVENOUS
  Filled 2022-04-09 (×2): qty 8

## 2022-04-09 MED ORDER — ACETAMINOPHEN 325 MG PO TABS: 650.0000 mg | ORAL_TABLET | Freq: Four times a day (QID) | ORAL | Status: DC | PRN

## 2022-04-09 MED ORDER — DAPAGLIFLOZIN PROPANEDIOL 10 MG PO TABS
10.0000 mg | ORAL_TABLET | Freq: Every day | ORAL | Status: DC
  Administered 2022-04-09 – 2022-04-13 (×5): 10 mg via ORAL
  Filled 2022-04-09 (×6): qty 1

## 2022-04-09 MED ORDER — ENOXAPARIN SODIUM 40 MG/0.4ML IJ SOSY
40.0000 mg | PREFILLED_SYRINGE | INTRAMUSCULAR | Status: DC
  Administered 2022-04-09 – 2022-04-13 (×4): 40 mg via SUBCUTANEOUS
  Filled 2022-04-09 (×5): qty 0.4

## 2022-04-09 MED ORDER — POTASSIUM CHLORIDE CRYS ER 20 MEQ PO TBCR
40.0000 meq | EXTENDED_RELEASE_TABLET | Freq: Once | ORAL | Status: AC
  Administered 2022-04-09: 40 meq via ORAL
  Filled 2022-04-09: qty 2

## 2022-04-09 MED ORDER — DIGOXIN 125 MCG PO TABS
0.0625 mg | ORAL_TABLET | Freq: Every day | ORAL | Status: DC
Start: 2022-04-09 — End: 2022-04-18
  Administered 2022-04-09 – 2022-04-17 (×8): 0.0625 mg via ORAL
  Filled 2022-04-09 (×8): qty 1

## 2022-04-09 MED ORDER — ONDANSETRON HCL 4 MG PO TABS: 4.0000 mg | ORAL_TABLET | Freq: Four times a day (QID) | ORAL | Status: DC | PRN

## 2022-04-09 MED ORDER — ONDANSETRON HCL 4 MG/2ML IJ SOLN
4.0000 mg | Freq: Four times a day (QID) | INTRAMUSCULAR | Status: DC | PRN
  Administered 2022-04-15: 4 mg via INTRAVENOUS
  Filled 2022-04-09: qty 2

## 2022-04-09 MED ORDER — POLYETHYLENE GLYCOL 3350 17 G PO PACK: 17.0000 g | PACK | Freq: Every day | ORAL | Status: DC | PRN

## 2022-04-09 NOTE — Assessment & Plan Note (Addendum)
.   Continuing home regimen of lipid lowering therapy.  

## 2022-04-09 NOTE — Hospital Course (Signed)
Zachary Arias was admitted to the hospital with the working diagnosis of decompensated heart failure.   61 year old male with past medical history of advanced systolic congestive heart failure (Echo 12/2021 EF 30-35%) on continuous dobutamine infusion, (end stage and inotropic dependent) coronary artery disease (cath 02/2020 Dist RCA 100% stenosed, prox RCA 50% stenosed, Mid Cx/Mid LAD/1st Mrg all 30-40%), chronic hypoxic respiratory failure (on 2lpm via [place] at home), chronic kidney disease stage IIIa who presents with dyspnea.  Recent hospitalization 04/23 to 02/04/22 for heart failure with low output cardiogenic shock, he was diuresed and discharged home with continuous infusion of dobutamine along with hospice.  Reported 3 to 4 days of worsening dyspnea and weakness, worse with exertion, refractive to [place] flow increase in his home supplemental 02. On his initial physical examination his blood pressure was 99.75, HR 85, RR 15 and 02 saturation 100%, lungs with rales bilateral with no wheezing, increase work of breathing, heart with S1 and S2 present with no gallops or murmurs, no abdominal distention and positive lower extremity edema.   Na 145, K 3,8 CL 109, bicarbonate 23, glucose 115 bun 22 cr 1,65 AST 94, ALT 226, total Bil 1,7  BNP 3,236  High sensitive troponin 28 and 33  Wbc 98 hgb 12,5 plt 269  Sars covid 19 negative   Chest radiograph with cardiomegaly, bilateral hilar vascular congestion, no infiltrates or effusions.   EKG 87 bpm, normal axis, qtc 577, sinus rhythm with left atrial enlargement, ST depression V5 and V6 with no significant T wave changes.   Patient had diuresis for volume overload with improvement in his symptoms.  Plan for TEE and cardiac catheterization, possible mitral valve intervention may help in improving his heart function and wean off dobutamine.

## 2022-04-09 NOTE — Assessment & Plan Note (Signed)
Patient has been in hospice but now considering possible advance heart failure interventions.  Code status is DNR

## 2022-04-09 NOTE — Assessment & Plan Note (Deleted)
Echo 12/2021 with EF 30-35%, global hypokinesis, IVC dilated with <50% resp variability CXR at presentation with cardiomegaly with vascular congestion CXR with cardiomegaly, interval decrease in pulm vascular congestion Saw Dr. Teressa Lower 02/2022, at that time, thought time, noted end stage and inotrope dependent, at that time, not thought to be good candidate for advanced therapy with lack of support system.  Today, question if candidate for advanced therapy now he has insurance? (per cardiology). Continue dobutamine per cards (set to run out? Per cards notes?) Lasix 80 mg IV BID Farxiga, digoxin, spironolactone Strict intake, output, daily weights

## 2022-04-09 NOTE — TOC CM/SW Note (Signed)
HF TOC CM received notification from [facility] RN, [name] and pt has been assigned to [facility] services. He was discharged from [facility]. Pt currently on IV Dobutamine at home. Will continue to follow for dc needs. [name] RN3 CCM, Heart Failure TOC CM [phone]

## 2022-04-09 NOTE — Consult Note (Signed)
Consultation Note Date: 04/09/2022   Patient Name: Zachary Arias  DOB: Feb 24, 1961  MRN: [id number]  Age / Sex: 61 y.o., male  PCP: Default, Provider, MD Referring Physician: Vernelle Emerald, MD  Reason for Consultation: Establishing goals of care, "end stage heart failure"  HPI/Patient Profile: 61 y.o. male  with past medical history of advanced systolic congestive heart failure  (echo 12/2021 EF 30-35%) on continuous dobutamine infusion, coronary artery disease (cath 02/2020 dist RCA 100% stenosed, prox RCA 50% stenosed, Mid Cx/Mid LAD/1st Mrg all 30-40%), chronic hypoxic respiratory failure (on 2L via [place] at home), chronic kidney disease stage III presented to ED on 04/08/2022 from home with complaints of generalized weakness x several days and also with increased shortness of breath.  Patient was admitted on 04/08/2022 with acute on chronic systolic heart failure, elevated troponin level not due to myocardial infarction, coronary artery disease involving native coronary artery of native heart without angina pectoris.   Of note, patient had recent prolonged hospitalization from 4/23 -02/04/2022 for acute congestive heart failure and cardiogenic shock.  He was ultimately discharged home with a PICC line, continuous dobutamine infusion, and hospice services.  Patient was originally admitted into [facility]; however, was discharged and was in the process of getting admitted with [facility].  He was scheduled to be admitted with [facility] hospice 04/09/2022.  Unfortunately, he was not enrolled due to being hospitalized 1 day prior to admission.   Clinical Assessment and Goals of Care: I have reviewed medical records including EPIC notes, labs, and imaging. Received report from primary RN - no acute concerns.  RN reports patient is "very weak" -she indicates that he has a hard time sitting up by himself and due to  extreme fatigue has a hard time eating.  Went to visit patient at bedside - no family/visitors present. Patient was lying in bed awake, alert, oriented, and able to participate in conversation. No signs or non-verbal gestures of pain or discomfort noted. No respiratory distress, increased work of breathing, or secretions noted.  Patient also reports extreme fatigue and feeling "tired."  He laments how he is "struggling to eat" and how setting up his meal tray is very difficult.  I assisted patient with setting up meal tray - he was able to eat about 50%.  He denies pain.  He states his shortness of breath is much better today compared to yesterday.  He is on 2 L O2 nasal cannula, which is chronic.  Met with patient  to discuss diagnosis, prognosis, GOC, EOL wishes, disposition, and options.  I introduced Palliative Medicine as specialized medical care for people living with serious illness. It focuses on providing relief from the symptoms and stress of a serious illness. The goal is to improve quality of life for both the patient and the family.  We discussed a brief life review of the patient as well as functional and nutritional status.  Patient used to work for a [employer] but is currently now on long-term disability.  He is not married and has no children.  His closest relative is his brother; however, he reports not being very close with.  He has not seen his brother that lives in [place] for "many years" but they do occasionally speak over the phone.  Patient last spoke with his brother over the phone "a few days ago."  Prior to hospitalization, patient was in a third floor apartment alone.  He "does not go out much and does not do much."  He was able to care for himself at home.  He had his groceries delivered via online services and he was able to cook meals for himself.  Patient reports having a good appetite, but the act of feeding himself is very tiring.  Albumin noted at 3.3 on  04/09/2022.  We discussed patient's current illness and what it means in the larger context of patient's on-going co-morbidities.  Patient understands that CKD and CHF are progressive, non-curable diseases underlying the patient's current acute medical conditions.  Patient understands up until this point he has not been a candidate for aggressive therapy in part due to lack of insurance.  Now that he has Medicaid medical team has started discussions around LVAD.  Patient does seem to have a clear understanding of what LVAD surgery would entail; however, he would benefit from additional education around anticipated trajectory/needs postprocedure, which was discussed.  Patient understands that LVAD would be an open heart surgery.  He would need ongoing dressing changes post-procedure as well as 24/7 supervision/assistance from a caregiver for at least 2 weeks after returning home.  Transportation to/from clinic was discussed.  Natural disease trajectory and expectations at EOL were discussed. I attempted to elicit values and goals of care important to the patient. The difference between aggressive medical intervention and comfort care was considered in light of the patient's goals of care.  After detailed discussion, patient would like to continue LVAD work-up and will make stepwise decisions pending evaluation results.  If LVAD is not offered, he is open to continue hospice services.  If LVAD is offered he understands lack of caregiver support is a huge barrier and could be considered a hard stop.  If LVAD is offered he may be open to further discussing caregiving options with his brother/sister-in-law/nephews who are in their 17s.   Kennyth Lose SW entered room to continue LVAD evaluation from social work standpoint.  Detailed discussion was had.  Social support continues to be a concern.  She was able to clarify that patient would not qualify for SNF rehab postprocedure, but could look into CIR admission.  Education  provided that patient would still need 24/7 supervision/assistance upon returning home for at least 2 weeks after CIR.   Patient would like to take time to process information given to him today.  Goals for today are to continue current medical treatment and LVAD work-up.  Patient will make stepwise decisions pending LVAD evaluation results.  Did not discuss code status or AD - will revisit tomorrow after patient has had time to process information from today.   Discussed with patient the importance of continued conversation with his family and the medical providers regarding overall plan of care and treatment options, ensuring decisions are within the context of the patient's values and GOCs.    Questions and concerns were addressed. The patient/family was encouraged to call with questions and/or concerns. PMT card was provided.   Primary Decision Maker: PATIENT    SUMMARY OF RECOMMENDATIONS   Continue full code/full scope for now Continue work up for LVAD - patient will make stepwise decisions pending evaluation results. He would be open to CIR if surgery is pursued  Social support is a concern - patient does not feel at this time he has any friends/family that would stay with him 24/7 post-procedure or anyone to assist with dressing changes. If VAD is offered, he will be more willing to discuss with his family Patient remains open to hospice services if LVAD not offered -  [facility] following inpatient but patient not yet enrolled PMT will continue to follow and support holistically   Code Status/Advance Care Planning: Full code  Palliative Prophylaxis:  Aspiration, Bowel Regimen, Delirium Protocol, Frequent Pain Assessment, Oral Care, and Turn Reposition  Additional Recommendations (Limitations, Scope, Preferences): Full Scope Treatment  Psycho-social/Spiritual:  Desire for further Chaplaincy support:no Created space and opportunity for patient to express thoughts and feelings  regarding patient's current medical situation.  Emotional support and therapeutic listening provided.  Prognosis:  Unable to determine  Discharge Planning: To Be Determined      Primary Diagnoses: Present on Admission:  Acute on chronic systolic (congestive) heart failure (HCC)  Chronic kidney disease, stage 3b (HCC)  Chronic respiratory failure with hypoxia (HCC)  Mixed hyperlipidemia  Coronary artery disease involving native coronary artery of native heart without angina pectoris  Elevated troponin level not due myocardial infarction   I have reviewed the medical record, interviewed the patient and family, and examined the patient. The following aspects are pertinent.  Past Medical History:  Diagnosis Date   Congestive heart failure (CHF) ([place])    secondary to ischemic cardiomyopathy with an ejection fraction of 25-30%   Coronary artery disease    History of hypertension    Inguinal hernia 2005   History of left inguinal hernia, status post repair in 2005   Tachycardia 2010     Unexplained tachycardia during hospitalization   Social History   Socioeconomic History   Marital status: Single    Spouse name: Not on file   Number of children: Not on file   Years of education: Not on file   Highest education level: Not on file  Occupational History   Not on file  Tobacco Use   Smoking status: Never   Smokeless tobacco: Never  Vaping Use   Vaping Use: Not on file  Substance and Sexual Activity   Alcohol use: Not Currently   Drug use: Not Currently   Sexual activity: Not on file  Other Topics Concern   Not on file  Social History Narrative   Not on file   Social Determinants of Health   Financial Resource Strain: High Risk (01/21/2022)   Overall Financial Resource Strain (CARDIA)    Difficulty of Paying Living Expenses: Hard  Food Insecurity: Food Insecurity Present (01/21/2022)   Hunger Vital Sign    Worried About Running Out of Food in the Last Year:  Sometimes true    Ran Out of Food in the Last Year: Sometimes true  Transportation Needs: Unmet Transportation Needs (03/08/2022)   PRAPARE - Hydrologist (Medical): Yes    Lack of Transportation (Non-Medical): Yes  Physical Activity: Not on file  Stress: Not on file  Social Connections: Not on file   Family History  Problem Relation Age of Onset   Cancer Mother        Ovarian Cancer   Heart disease Father    Heart attack Father    Cardiomyopathy Brother    Scheduled Meds:  amiodarone  200 mg Oral BID   aspirin EC  81 mg Oral Daily   atorvastatin  40 mg Oral Daily   Chlorhexidine Gluconate Cloth  6 each Topical Daily   dapagliflozin propanediol  10 mg Oral Daily   digoxin  0.0625 mg Oral Daily   enoxaparin (LOVENOX) injection  40 mg Subcutaneous Q24H   furosemide  80 mg Intravenous BID   spironolactone  12.5 mg Oral Daily   Continuous  Infusions:  DOBUTamine 5 mcg/kg/min (04/09/22 0903)   PRN Meds:.acetaminophen **OR** acetaminophen, LORazepam, ondansetron **OR** ondansetron (ZOFRAN) IV, polyethylene glycol Medications Prior to Admission:  Prior to Admission medications   Medication Sig Start Date End Date Taking? Authorizing Provider  amiodarone (PACERONE) 200 MG tablet Take 1 tablet (200 mg total) by mouth 2 (two) times daily. 12/21/21  Yes Bensimhon, Shaune Pascal, MD  ASPIRIN 81 PO Take 81 mg by mouth daily.   Yes [provider]  atorvastatin (LIPITOR) 40 MG tablet Take 1 tablet (40 mg total) by mouth daily. 11/25/21  Yes Lyda Jester M, PA-C  dapagliflozin propanediol (FARXIGA) 10 MG TABS tablet Take 1 tablet (10 mg total) by mouth daily for the heart. 03/22/22  Yes Bensimhon, Shaune Pascal, MD  digoxin (LANOXIN) 0.125 MG tablet Take 1/2 tablets (0.0625 mg total) by mouth daily. 03/08/22  Yes Bensimhon, Shaune Pascal, MD  DOBUTamine (DOBUTREX) 4-5 MG/ML-% infusion Inject 344.5 mcg/min into the vein continuous. 02/04/22  Yes Simmons, Brittainy M, PA-C   LORazepam (ATIVAN) 0.5 MG tablet Take 0.5 mg by mouth every 4 (four) hours as needed for anxiety (SOB).   Yes [provider]  Multiple Vitamin (MULTIVITAMIN) capsule Take 1 capsule by mouth daily.   Yes [provider]  potassium chloride SA (KLOR-CON M) 20 MEQ tablet Take 2 tablets (40 mEq total) by mouth daily. 02/17/22  Yes Milford, Maricela Bo, FNP  spironolactone (ALDACTONE) 25 MG tablet Take 1/2 tablet (12.5 mg total) by mouth daily. 02/04/22  Yes Lyda Jester M, PA-C   Allergies  Allergen Reactions   Avocado Nausea Only   Shellfish Allergy Nausea And Vomiting   Review of Systems  Constitutional:  Positive for activity change and fatigue. Negative for appetite change.  Respiratory:  Positive for shortness of breath.   Gastrointestinal:  Negative for nausea and vomiting.  Neurological:  Positive for weakness.  All other systems reviewed and are negative.   Physical Exam Vitals and nursing note reviewed.  Constitutional:      General: He is not in acute distress. Pulmonary:     Effort: No respiratory distress.  Skin:    General: Skin is warm and dry.  Neurological:     Mental Status: He is alert and oriented to person, place, and time.     Motor: Weakness present.  Psychiatric:        Attention and Perception: Attention normal.        Behavior: Behavior is cooperative.        Cognition and Memory: Cognition and memory normal.     Vital Signs: BP 90/75 (BP Location: Left Arm)   Pulse 83   Temp 97.7 F (36.5 C) (Oral)   Resp 20   Ht 5' 8"  (1.727 m)   Wt 64.4 kg   SpO2 100%   BMI 21.59 kg/m  Pain Scale: 0-10   Pain Score: 0-No pain   SpO2: SpO2: 100 % O2 Device:SpO2: 100 % O2 Flow Rate: .O2 Flow Rate (L/min): 4 L/min  IO: Intake/output summary:  Intake/Output Summary (Last 24 hours) at 04/09/2022 1414 Last data filed at 04/09/2022 1014 Gross per 24 hour  Intake 38.7 ml  Output 1050 ml  Net -1011.3 ml    LBM:   Baseline Weight:  Weight: 64.4 kg Most recent weight: Weight: 64.4 kg     Palliative Assessment/Data: PPS 40%     Time In: 1345 Time Out: 1515 Time Total: 90 minutes  Greater than 50%  of this time was  spent counseling and coordinating care related to the above assessment and plan.  Signed by: Lin Landsman, NP   Please contact Palliative Medicine Team phone at [phone] for questions and concerns.  For individual provider: See Shea Evans

## 2022-04-09 NOTE — Progress Notes (Signed)
[facility] EDUCATION NOTE:    Asked to see patient per Dr. Gala Romney.               VAD evaluation consent reviewed and signed by patient; no designated caregiver identified.  Initial VAD teaching completed with pt.  VAD educational packet including "Understanding Your Options with Advanced Heart Failure", "[employer] Patient Agreement for VAD Evaluation and Potential Implantation" consent, and [employer] "Heartmate 3 Left Ventricular Device (LVAD) Patient Guide", Heartmate 3 Left Ventricular Assist System Patient Education Program DVD", "[place] HM III Patient Education", "[place] Mechanical Circulatory Support Program", and "Decision Aids for Left Ventricular Assist Device" reviewed in detail and left at bedside for continued reference.   All questions answered regarding VAD implant, hospital stay, and what to expect when discharged home living with a heart pump. Pt has no one he can identify as his primary caregiver.  Explained need for 24/7 care when pt is discharged home due to sternal precautions, adaptation to living on support, emotional support, consistent and meticulous exit site care and management, medication adherence and high volume of follow up visits with the VAD Clinic after discharge; pt verbalized understanding of above. Patient does not have independent transportation. He says [facility] has been paying for his transportation in the past, but now that he has Medicaid, this would be his only transportation.   Reviewed and supplied a copy of home inspection check list stressing that only three pronged grounded power outlets can be used for VAD equipment. Patient believes he only has 2 prong outlets in his home. He also says he has three flights of stairs to his apartment.    Identified the following lifestyle modifications while living on MCS:   1. No driving for at six weeks and then only if doctor gives permission to do so. Patient does not drive. 2. No tub baths while pump implanted,  and shower only when doctor gives permission.   3. No swimming or submersion in water while implanted with pump.    4. Plan to sleep only when connected to the MPU. 5. Exit site care including dressing changes, monitoring for infection, and importance of keeping percutaneous lead stabilized at all times.    Reinforced need for 24 hour/7 day week caregivers.  He will also need to abide by sternal precautions with no lifting >10lbs, pushing, pulling and will need assistance with adapting to new life style with VAD equipment and care.   Intermacs patient survival statistics through March 2023 reviewed with patient and caregiver as follows:                           The patient understands that from this discussion it does not mean that they will receive the device, but that depends on an extensive evaluation process. The patient is aware of the fact that if at anytime they want to stop the evaluation process they can.  All questions have been answered at this time and contact information was provided should they encounter any further questions.    Contraindications to VAD at this time: Pt has no one he can identify as his primary caregiver. Patient has no family or friends that he can identify that could potentially help post VAD.  Patient does not have independent transportation.  He has Medicaid transportation only. This requires minimum three days notice to schedule transportation.  Patient believes he only has 2 prong outlets in his home. Patient lives in  third floor apartment. He would have to navigate three flights of stairs as fresh post-op.    Hessie Diener, RN VAD Coordinator   Office: [phone] 24/7 VAD Pager: [phone]

## 2022-04-09 NOTE — Progress Notes (Signed)
[facility])  Zachary Arias has been referred to [place] for hospice care. Due to this hospitalization our team has not yet been able to see him. We will follow him in the hospital and will be available for any discharge disposition needs.   Please feel free to contact [facility] for any hospice related questions.  Yolande Jolly, DNP, [facility] [phone]

## 2022-04-09 NOTE — H&P (Signed)
History and Physical    Patient: Zachary Arias MRN: [id number] DOA: 04/08/2022  Date of Service: the patient was seen and examined on 04/09/2022  Patient coming from: Home via EMS  Chief Complaint:  Chief Complaint  Patient presents with   Shortness of Breath    HPI:   61 year old male with past medical history of advanced systolic congestive heart failure (Echo 12/2021 EF 30-35%) on continuous dobutamine infusion, Coronary artery disease (cath 02/2020 Dist RCA 100% stenosed, prox RCA 50% stenosed, Mid Cx/Mid LAD/1st Mrg all 30-40%), chronic hypoxic respiratory failure (on 2lpm via [place] at home), chronic kidney disease stage IIIa who presents to [facility] emergency department via EMS with complaints of generalized weakness and shortness of breath.  Of note, patient had a prolonged hospitalization at [facility] from 4/23 until 5/11 for acute congestive heart failure and cardiogenic shock.  Patient had to be managed with a combination of inotropic agents including dobutamine as well as a Lasix infusion during that hospitalization eventually resulting in 25 pound weight loss eventually being discharged home with a PICC line with continuous dobutamine infusion on 5/11.  Patient was referred to hospice at time of discharge although hospice care services seemingly have yet to be initiated.  Patient explains that for the past 3 to 4 days he has been experiencing increasing generalized weakness.  This increasing generalized weakness has become rather severe in intensity and is worse with exertion.  Symptoms have been associated with increasing shortness of breath.  Shortness of breath is particularly severe and worse with any exertion whatsoever.  Patient reports compliance with all medications.  Patient denies associated chest pain, cough, fevers, sick contacts, recent travel.  Patient reports increasing his home regimen of supplemental oxygen to try and improve his symptoms.  Due to  patient's progressively worsening symptoms EMS was contacted who promptly evaluated the patient and brought patient into [facility] emergency department for evaluation.  Upon evaluation in the emergency department patient was clinically felt to be suffering from acute congestive heart failure.  ER provider discussed case with Dr. Daphine Deutscher with cardiology who recommended the hospitalists admit with cardiology to consult.  ER provider ordered a dose of intravenous Lasix.  The hospitalist group has now been called to assess the patient for admission the hospital.  Review of Systems: Review of Systems  Respiratory:  Positive for shortness of breath.   Neurological:  Positive for weakness.  All other systems reviewed and are negative.    Past Medical History:  Diagnosis Date   Congestive heart failure (CHF) (HCC)    secondary to ischemic cardiomyopathy with an ejection fraction of 25-30%   Coronary artery disease    History of hypertension    Inguinal hernia 2005   History of left inguinal hernia, status post repair in 2005   Tachycardia 2010     Unexplained tachycardia during hospitalization    Past Surgical History:  Procedure Laterality Date   CARDIAC CATHETERIZATION   06/27/2009   ejection fraction of approximately 35%   INGUINAL HERNIA REPAIR  01/27/2004   History of left inguinal hernia, status post repair in 2005   RIGHT/LEFT HEART CATH AND CORONARY ANGIOGRAPHY N/A 03/10/2020   Procedure: RIGHT/LEFT HEART CATH AND CORONARY ANGIOGRAPHY;  Surgeon: [name], MD;  Location: [facility] INVASIVE CV LAB;  Service: Cardiovascular;  Laterality: N/A;    Social History:  reports that he has never smoked. He has never used smokeless tobacco. He reports that he does not  currently use alcohol. He reports that he does not currently use drugs.  Allergies  Allergen Reactions   Avocado Nausea Only   Shellfish Allergy Nausea And Vomiting    Family History  Problem Relation Age of  Onset   Cancer Mother        Ovarian Cancer   Heart disease Father    Heart attack Father    Cardiomyopathy Brother     Prior to Admission medications   Medication Sig Start Date End Date Taking? Authorizing Provider  amiodarone (PACERONE) 200 MG tablet Take 1 tablet (200 mg total) by mouth 2 (two) times daily. 12/21/21  Yes Bensimhon, Shaune Pascal, MD  ASPIRIN 81 PO Take 81 mg by mouth daily.   Yes [provider]  atorvastatin (LIPITOR) 40 MG tablet Take 1 tablet (40 mg total) by mouth daily. 11/25/21  Yes Lyda Jester M, PA-C  dapagliflozin propanediol (FARXIGA) 10 MG TABS tablet Take 1 tablet (10 mg total) by mouth daily for the heart. 03/22/22  Yes Bensimhon, Shaune Pascal, MD  digoxin (LANOXIN) 0.125 MG tablet Take 1/2 tablets (0.0625 mg total) by mouth daily. 03/08/22  Yes Bensimhon, Shaune Pascal, MD  DOBUTamine (DOBUTREX) 4-5 MG/ML-% infusion Inject 344.5 mcg/min into the vein continuous. 02/04/22  Yes Simmons, Brittainy M, PA-C  LORazepam (ATIVAN) 0.5 MG tablet Take 0.5 mg by mouth every 4 (four) hours as needed for anxiety (SOB).   Yes [provider]  Multiple Vitamin (MULTIVITAMIN) capsule Take 1 capsule by mouth daily.   Yes [provider]  potassium chloride SA (KLOR-CON M) 20 MEQ tablet Take 2 tablets (40 mEq total) by mouth daily. 02/17/22  Yes Milford, Maricela Bo, FNP  spironolactone (ALDACTONE) 25 MG tablet Take 1/2 tablet (12.5 mg total) by mouth daily. 02/04/22  Yes Consuelo Pandy, PA-C    Physical Exam:  Vitals:   04/09/22 0700 04/09/22 0730 04/09/22 0800 04/09/22 0900  BP: 99/75 103/73 103/79 98/71  Pulse: 73 81 86 82  Resp: 15 15 18 18   Temp:      TempSrc:      SpO2: 100% 100% 100% 100%  Weight:      Height:        Constitutional: Awake alert and oriented x3, no associated distress.   Skin: no rashes, no lesions, good skin turgor noted. Eyes: Pupils are equally reactive to light.  No evidence of scleral icterus or conjunctival pallor.   ENMT: Moist mucous membranes noted.  Posterior pharynx clear of any exudate or lesions.   Neck: normal, supple, no masses, no thyromegaly.  Markedly elevated jugular venous pulse at 45 degrees.   Respiratory: Bibasilar rales without any evidence of wheezing.  Increased respiratory effort without accessory muscle use.  Cardiovascular: Regular rate and rhythm, no murmurs / rubs / gallops.  Notable significant pitting edema of the bilateral lower extremities.  2+ pedal pulses. No carotid bruits.  Chest:   Nontender without crepitus or deformity.   Back:   Nontender without crepitus or deformity. Abdomen: Abdomen is soft and nontender.  No evidence of intra-abdominal masses.  Positive bowel sounds noted in all quadrants.   Musculoskeletal: No joint deformity upper and lower extremities. Good ROM, no contractures. Normal muscle tone.  Neurologic: CN 2-12 grossly intact. Sensation intact.  Patient moving all 4 extremities spontaneously.  Patient is following all commands.  Patient is responsive to verbal stimuli.   Psychiatric: Patient exhibits normal mood with flat affect.  Patient seems to possess insight as to their current  situation.    Data Reviewed:  I have personally reviewed and interpreted labs, imaging.  Significant findings are:  Chemistry revealing sodium 143, potassium 4.2, chloride of 109, bicarbonate 20, BUN 24, creatinine 1.2 CBC revealing white blood cell count of 9.6, hemoglobin 12.2, hematocrit 38.2, platelet count of 282 BNP 3236 up from 503 on 6/13 First troponin 28, second troponin 33 COVID-19 PCR testing negative Chest x-ray personally reviewed revealing some degree of vascular congestion with evidence of cardiomegaly.  EKG: Personally reviewed.  Rhythm is normal sinus rhythm with heart rate of 87 bpm.  No dynamic ST segment changes appreciated.   Assessment and Plan: * Acute on chronic systolic (congestive) heart failure (HCC) Patient suffering from end-stage heart  failure with limited therapeutic options Patient presenting with progressively worsening symptoms including shortness of breath and weakness despite compliance with dobutamine, diuretics and digoxin. Patient has been referred to hospice but according to EDP discussions with them this evening they are not actively managing his heart failure symptoms at this point We will hospitalize patient, continue dobutamine infusion and initiate intravenous Lasix. Based on clinical response to intermittent Lasix will increase dosing or even transition to Lasix infusion if necessary. Advanced heart failure team consultation to follow, their assistance is appreciated We will additionally obtain palliative care consultation in the morning.  Elevated troponin level not due myocardial infarction Slightly elevated serial troponins with flat trajectory of elevation Patient is chest pain-free Likely secondary to advanced heart failure, plaque rupture is unlikely Monitoring patient on telemetry   Coronary artery disease involving native coronary artery of native heart without angina pectoris Patient is currently chest pain free Monitoring patient on telemetry Continue home regimen of antiplatelet therapy, lipid lowering therapy   Chronic kidney disease, stage 3b (HCC) Strict intake and output monitoring Creatinine near baseline Minimizing nephrotoxic agents as much as possible Serial chemistries to monitor renal function and electrolytes   Chronic respiratory failure with hypoxia (HCC) Continue supplemental oxygen per home regimen of 2 L/min at all times Titrate as necessary to achieve oxygen saturations between 92 to 96%.  Mixed hyperlipidemia Continuing home regimen of lipid lowering therapy.     Code Status:  Full code  code status decision has been confirmed with: patient Family Communication: deferred   Consults: Advanced Heart Failure Consult  Severity of Illness:  The appropriate patient  status for this patient is INPATIENT. Inpatient status is judged to be reasonable and necessary in order to provide the required intensity of service to ensure the patient's safety. The patient's presenting symptoms, physical exam findings, and initial radiographic and laboratory data in the context of their chronic comorbidities is felt to place them at high risk for further clinical deterioration. Furthermore, it is not anticipated that the patient will be medically stable for discharge from the hospital within 2 midnights of admission.   * I certify that at the point of admission it is my clinical judgment that the patient will require inpatient hospital care spanning beyond 2 midnights from the point of admission due to high intensity of service, high risk for further deterioration and high frequency of surveillance required.*  Author:  Marinda Elk MD  04/09/2022 9:45 AM

## 2022-04-09 NOTE — Assessment & Plan Note (Addendum)
Patient is currently chest pain free Monitoring patient on telemetry Aspirin, lipitor

## 2022-04-09 NOTE — Progress Notes (Signed)
PROGRESS NOTE    Zachary Arias  [id number] DOB: 31-Aug-1961 DOA: 04/08/2022 PCP: Default, Provider, MD  Chief Complaint  Patient presents with   Shortness of Breath    Brief Narrative:  61 year old male with past medical history of advanced systolic congestive heart failure (Echo 12/2021 EF 30-35%) on continuous dobutamine infusion, Coronary artery disease (cath 02/2020 Dist RCA 100% stenosed, prox RCA 50% stenosed, Mid Cx/Mid LAD/1st Mrg all 30-40%), chronic hypoxic respiratory failure (on 2lpm via [place] at home), chronic kidney disease stage IIIa who presents to [facility] emergency department via EMS with complaints of generalized weakness and shortness of breath.    Assessment & Plan:   Principal Problem:   Acute on chronic systolic (congestive) heart failure (HCC) Active Problems:   Elevated troponin level not due myocardial infarction   Coronary artery disease involving native coronary artery of native heart without angina pectoris   Chronic kidney disease, stage 3b (HCC)   Acute on chronic respiratory failure with hypoxia (HCC)   Mixed hyperlipidemia   Goals of care, counseling/discussion   Palliative care by specialist   Full code status   Encounter for hospice care discussion   Assessment and Plan: * Acute on chronic systolic (congestive) heart failure ([place]) Echo 12/2021 with EF 30-35%, global hypokinesis, IVC dilated with <50% resp variability CXR at presentation with cardiomegaly with vascular congestion CXR with cardiomegaly, interval decrease in pulm vascular congestion Saw Dr. Jeffie Pollock 02/2022, at that time, thought time, noted end stage and inotrope dependent, at that time, not thought to be good candidate for advanced therapy with lack of support system.  Today, question if candidate for advanced therapy now he has insurance? (per cardiology). Continue dobutamine per cards (set to run out? Per cards notes?) Lasix 80 mg IV BID Farxiga, digoxin,  spironolactone Strict intake, output, daily weights  Elevated troponin level not due myocardial infarction Slightly elevated serial troponins with flat trajectory of elevation Patient is chest pain-free Likely secondary to advanced heart failure, plaque rupture is unlikely  Coronary artery disease involving native coronary artery of native heart without angina pectoris Patient is currently chest pain free Monitoring patient on telemetry Aspirin, lipitor    Chronic kidney disease, stage 3b (HCC) Strict intake and output monitoring Creatinine near baseline Minimizing nephrotoxic agents as much as possible Serial chemistries to monitor renal function and electrolytes   Acute on chronic respiratory failure with hypoxia (HCC) Uses 2 L at home Currently requiring 4 L Wean to baseline as tolerated with goal sat >92%   Mixed hyperlipidemia Continuing home regimen of lipid lowering therapy.   Goals of care, counseling/discussion Enrolled in hospice outpatient? Palliative consulted here.  Will need to see what cardiology recommends with regards to advanced therapies? (see Dr. Damaris Schooner note from 7/14)     DVT prophylaxis: lovenox Code Status: full Family Communication: none at bedside Disposition:   Status is: Inpatient Remains inpatient appropriate because: need for continued inpatient therapy   Consultants:  cardiology  Procedures:  none  Antimicrobials:  Anti-infectives (From admission, onward)    None       Subjective: Feels weak and SOB with activity  Objective: Vitals:   04/09/22 1321 04/09/22 1649 04/09/22 1743 04/09/22 1830  BP: 90/75 97/77 97/77  92/76  Pulse: 83 81 85 84  Resp: 20 19 18 18   Temp:  (!) 96.7 F (35.9 C) (!) 96.7 F (35.9 C) 97.8 F (36.6 C)  TempSrc:  Axillary  Rectal  SpO2:  100% 100% 100%  Weight:  65.2 kg     Height: 5\' 8"  (1.727 m)       Intake/Output Summary (Last 24 hours) at 04/09/2022 1936 Last data filed at  04/09/2022 1723 Gross per 24 hour  Intake 438.95 ml  Output 1050 ml  Net -611.05 ml   Filed Weights   04/08/22 2320 04/09/22 1321  Weight: 64.4 kg 65.2 kg    Examination:  General exam: Appears calm and comfortable  Respiratory system: on [place], no increased wob Cardiovascular system: RRR Gastrointestinal system: Abdomen is nondistended, soft and nontende Central nervous system: Alert and oriented. No focal neurological deficits. Extremities: trace LEE,trace sacral edema  Data Reviewed: I have personally reviewed following labs and imaging studies  CBC: Recent Labs  Lab 04/08/22 2055 04/09/22 0403  WBC 9.6 9.8  NEUTROABS 7.2 7.0  HGB 12.2* 12.5*  HCT 38.2* 38.2*  MCV 85.8 86.6  PLT 282 269    Basic Metabolic Panel: Recent Labs  Lab 04/08/22 2055 04/09/22 0403  NA 143 145  K 4.2 3.8  CL 109 109  CO2 20* 23  GLUCOSE 139* 115*  BUN 24* 22*  CREATININE 1.82* 1.65*  CALCIUM 9.0 8.7*  MG  --  2.5*    GFR: Estimated Creatinine Clearance: 44.5 mL/min (Zachary Arias) (by C-G formula based on SCr of 1.65 mg/dL (H)).  Liver Function Tests: Recent Labs  Lab 04/09/22 0403  AST 94*  ALT 226*  ALKPHOS 139*  BILITOT 1.7*  PROT 5.6*  ALBUMIN 3.3*    CBG: No results for input(s): "GLUCAP" in the last 168 hours.   Recent Results (from the past 240 hour(s))  SARS Coronavirus 2 by RT PCR (hospital order, performed in [facility] hospital lab) *cepheid single result test* Anterior Nasal Swab     Status: None   Collection Time: 04/08/22  8:55 PM   Specimen: Anterior Nasal Swab  Result Value Ref Range Status   SARS Coronavirus 2 by RT PCR NEGATIVE NEGATIVE Final    Comment: (NOTE) SARS-CoV-2 target nucleic acids are NOT DETECTED.  The SARS-CoV-2 RNA is generally detectable in upper and lower respiratory specimens during the acute phase of infection. The lowest concentration of SARS-CoV-2 viral copies this assay can detect is 250 copies / mL. Zachary Arias negative result does not  preclude SARS-CoV-2 infection and should not be used as the sole basis for treatment or other patient management decisions.  Zachary Arias negative result may occur with improper specimen collection / handling, submission of specimen other than nasopharyngeal swab, presence of viral mutation(s) within the areas targeted by this assay, and inadequate number of viral copies (<250 copies / mL). Zachary Arias negative result must be combined with clinical observations, patient history, and epidemiological information.  Fact Sheet for Patients:   04/10/22  Fact Sheet for Healthcare Providers: RoadLapTop.co.za  This test is not yet approved or  cleared by the http://kim-miller.com/ FDA and has been authorized for detection and/or diagnosis of SARS-CoV-2 by FDA under an Emergency Use Authorization (EUA).  This EUA will remain in effect (meaning this test can be used) for the duration of the COVID-19 declaration under Section 564(b)(1) of the Act, 21 U.S.C. section 360bbb-3(b)(1), unless the authorization is terminated or revoked sooner.  Performed at [facility], [address], [place], [place]          Radiology Studies: DG CHEST PORT 1 VIEW  Result Date: 04/09/2022 CLINICAL DATA:  Shortness of breath, generalized weakness EXAM: PORTABLE CHEST 1 VIEW COMPARISON:  Previous studies  including the examination of 04/08/2022 FINDINGS: Transfer diameter heart is increased. There is slight decrease in pulmonary vascular congestion. There are no signs of pulmonary edema or new focal infiltrates. There is no pleural effusion or pneumothorax. Tip of PICC line is seen in superior vena cava. IMPRESSION: Cardiomegaly. There is interval decrease in pulmonary vascular congestion. There are no signs of alveolar pulmonary edema or new focal infiltrates. Electronically Signed   By: Ernie Avena M.D.   On: 04/09/2022 13:53   DG Chest 2 View  Result Date:  04/08/2022 CLINICAL DATA:  Shortness of breath EXAM: CHEST - 2 VIEW COMPARISON:  03/04/2020, 01/27/2022 FINDINGS: Cardiomegaly with vascular congestion. No focal airspace disease, pleural effusion, or pneumothorax IMPRESSION: Cardiomegaly with vascular congestion Electronically Signed   By: Jasmine Pang M.D.   On: 04/08/2022 20:41        Scheduled Meds:  amiodarone  200 mg Oral BID   aspirin EC  81 mg Oral Daily   atorvastatin  40 mg Oral Daily   Chlorhexidine Gluconate Cloth  6 each Topical Daily   dapagliflozin propanediol  10 mg Oral Daily   digoxin  0.0625 mg Oral Daily   enoxaparin (LOVENOX) injection  40 mg Subcutaneous Q24H   furosemide  80 mg Intravenous BID   spironolactone  12.5 mg Oral Daily   Continuous Infusions:  DOBUTamine 5 mcg/kg/min (04/09/22 0903)     LOS: 1 day    Time spent: over 30 min    Lacretia Nicks, MD [facility]   To contact the attending provider between 7A-7P or the covering provider during after hours 7P-7A, please log into the web site [email or web address] and access using universal [place] password for that web site. If you do not have the password, please call the hospital operator.  04/09/2022, 7:36 PM

## 2022-04-09 NOTE — Assessment & Plan Note (Addendum)
Strict intake and output monitoring Creatinine near baseline Minimizing nephrotoxic agents as much as possible Serial chemistries to monitor renal function and electrolytes  

## 2022-04-09 NOTE — Progress Notes (Addendum)
Advanced Heart Failure Rounding Note  PCP-Cardiologist: Dr. Haroldine Laws   Subjective:    Received 20 mg IV Lasix upon arrival + 40 mg IV around 1 am today.   Denies resting dyspnea. + JVD to jaw. Currently resting comfortably. On home DBA 5 mcg/kg/min.   SCr 1.82>>1.65 w/ diuresis (b/l ~1.7) K 3.8  SBPs upper 90s-low 100s.   It is unclear if he has been taking home torsemide correctly. Not currently on med list but he belives he has this at home but only taking 1 tablet in AM and PM. AHF Clinic note says to take 40 mg qam/ 20 mg qpm.    Objective:   Weight Range: 64.4 kg Body mass index is 21.59 kg/m.   Vital Signs:   Temp:  [97.7 F (36.5 C)] 97.7 F (36.5 C) (07/13 1935) Pulse Rate:  [73-102] 73 (07/14 0700) Resp:  [13-28] 15 (07/14 0700) BP: (90-115)/(63-81) 99/75 (07/14 0700) SpO2:  [98 %-100 %] 100 % (07/14 0700) Weight:  [64.4 kg] 64.4 kg (07/13 2320)    Weight change: Filed Weights   04/08/22 2320  Weight: 64.4 kg    Intake/Output:  No intake or output data in the 24 hours ending 04/09/22 0742    Physical Exam    General:  thin appearing. No resp difficulty HEENT: Normal Neck: Supple.  Distended neck veins. JVP elevated to jaw . Carotids 2+ bilat; no bruits. No lymphadenopathy or thyromegaly appreciated. Cor: PMI nondisplaced. Regular rate & rhythm. 3/6 MR/TR murmurs Lungs: decreased BS at the bases b/l Abdomen: Soft, nontender, nondistended. No hepatosplenomegaly. No bruits or masses. Good bowel sounds. Extremities: No cyanosis, clubbing, rash, edema + RUE PICC  Neuro: Alert & orientedx3, cranial nerves grossly intact. moves all 4 extremities w/o difficulty. Affect pleasant   Telemetry   NSR 90s   EKG    NSR 87 bpm, BAE  Labs    CBC Recent Labs    04/08/22 2055 04/09/22 0403  WBC 9.6 9.8  NEUTROABS 7.2 7.0  HGB 12.2* 12.5*  HCT 38.2* 38.2*  MCV 85.8 86.6  PLT 282 [id number]   Basic Metabolic Panel Recent Labs    04/08/22 2055  04/09/22 0403  NA 143 145  K 4.2 3.8  CL 109 109  CO2 20* 23  GLUCOSE 139* 115*  BUN 24* 22*  CREATININE 1.82* 1.65*  CALCIUM 9.0 8.7*  MG  --  2.5*   Liver Function Tests Recent Labs    04/09/22 0403  AST 94*  ALT 226*  ALKPHOS 139*  BILITOT 1.7*  PROT 5.6*  ALBUMIN 3.3*   No results for input(s): "LIPASE", "AMYLASE" in the last 72 hours. Cardiac Enzymes No results for input(s): "CKTOTAL", "CKMB", "CKMBINDEX", "TROPONINI" in the last 72 hours.  BNP: BNP (last 3 results) Recent Labs    02/12/22 1432 03/09/22 1540 04/08/22 2055  BNP 562.1* 503.3* 3,236.0*    ProBNP (last 3 results) No results for input(s): "PROBNP" in the last 8760 hours.   D-Dimer No results for input(s): "DDIMER" in the last 72 hours. Hemoglobin A1C No results for input(s): "HGBA1C" in the last 72 hours. Fasting Lipid Panel No results for input(s): "CHOL", "HDL", "LDLCALC", "TRIG", "CHOLHDL", "LDLDIRECT" in the last 72 hours. Thyroid Function Tests No results for input(s): "TSH", "T4TOTAL", "T3FREE", "THYROIDAB" in the last 72 hours.  Invalid input(s): "FREET3"  Other results:   Imaging    DG Chest 2 View  Result Date: 04/08/2022 CLINICAL DATA:  Shortness of breath EXAM: CHEST -  2 VIEW COMPARISON:  03/04/2020, 01/27/2022 FINDINGS: Cardiomegaly with vascular congestion. No focal airspace disease, pleural effusion, or pneumothorax IMPRESSION: Cardiomegaly with vascular congestion Electronically Signed   By: Jasmine Pang M.D.   On: 04/08/2022 20:41     Medications:     Scheduled Medications:  amiodarone  200 mg Oral BID   aspirin EC  81 mg Oral Daily   atorvastatin  40 mg Oral Daily   dapagliflozin propanediol  10 mg Oral Daily   digoxin  0.0625 mg Oral Daily   enoxaparin (LOVENOX) injection  40 mg Subcutaneous Q24H   furosemide  60 mg Intravenous BID   spironolactone  12.5 mg Oral Daily    Infusions:  DOBUTamine 5 mcg/kg/min (04/09/22 0057)    PRN  Medications: acetaminophen **OR** acetaminophen, LORazepam, ondansetron **OR** ondansetron (ZOFRAN) IV, polyethylene glycol    Patient Profile   61 y.o. 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 Cath with 1-v CAD with chronically occluded RCA. MRI in January 2011 with EF 31% with inferior scar. Recent admit 4/23 for a/c CHF w/ low output, requiring DBA. He is end-stage and inotrope dependent, on home DBA. Not a good candidate for advanced therapies with lack of support system, and non-citizen status and does not want this. Has been followed by home hospice.   Presented to ED w/ CC of progressive dyspnea and increased supp O2 requirements.   BNP 3,236. CXR w/ vascular congestion.    Assessment/Plan   1. Acute on Chronic Systolic Heart Failure - Cath with 1-v CAD with chronically occluded RCA. MRI in January 2011 with EF 31% with inferior scar.  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   - Echo (8/13): EF 45-50% .  - No f/u between to 2015-2021 - Echo (6/21): EF 20-25% severe central MR. Repeat cath 6/21 with stable CAD - Echo (4/22): EF 40-45% mild to mod MR RV mild HK  - Echo (4/23): EF 30-35%.   - Admit 4/23 with shock, started on DBA and lasix. Diuresed 20 lbs. Unable to wean DBA - He is end-stage and inotrope dependent. Not a good candidate for advanced therapies with lack of support system, and non-citizen status and does not want this - On Home DBA 5 mcg/kg/min via single lumen PICC. Under hospice care - Now w/ a/c CHF w/ pulmonary edema. Uncertain if he has been taking the correct dose of torsemide at home. - Increase IV Lasix to 80 mg bid  -Regarding home DBA, [facility] has been paying for this for 1 month. This is set to run out. If cannot get alternative funding source will have to stop. Unable to get insurance due to lack of citizenship.  - Continue Farxiga 10  mg daily  - Continue digoxin 0.0625 mg daily. Check dig level  - Continue spiro 12.5 mg daily. - Follow BMP w/ diuresis    2. CKD Stage IIIa - Creatinine baseline 1.6 - 1.82 on admit>>1.65 today - follow BMP    3. PVCs - Continue amio 200 mg bid.  4. CAD  - Cath with 1-v CAD with chronically occluded RCA - No s/s angina - Continue ASA and atorvastatin.     Length of Stay: 1  Brittainy Simmons, PA-C  04/09/2022, 7:42 AM  Advanced Heart Failure Team Pager [phone] (M-F; 7a - 5p)  Please contact [facility] for night-coverage after hours (5p -7a ) and weekends on [email or web address]  Patient seen and examined with the above-signed Advanced Practice Provider and/or Housestaff. I personally reviewed laboratory data, imaging studies and relevant notes. I independently examined the patient and formulated the important aspects of the plan. I have edited the note to reflect any of my changes or salient points. I have personally discussed the plan with the patient and/or family.  61 y/o male with end-stage systolic HF due to mixed iCM/NICM. Has been on Hospice with palliative DBA support. Admitted overnight with progressive SOB. As been anxious about switching Hospice companies today.   Has previously not been candidate for advanced therapies due to lack of insurance, poor compliance and lack of caregiver support. (Also refused in past)  Remains on DBA 5. Feels better with IV diuresis. Weight relatively stable  Now asking about potential for advanced therapies.   General:  Sitting up  No resp difficulty HEENT: normal Neck: supple. JVP 8-9 + prominent v-waves Carotids 2+ bilat; no bruits. No lymphadenopathy or thryomegaly appreciated. Cor: PMI nondisplaced. Regular rate & rhythm.+s3 Lungs: clear Abdomen: soft, nontender, nondistended. No hepatosplenomegaly. No bruits or masses. Good bowel sounds. Extremities: no cyanosis, clubbing, rash, edema Neuro: alert & orientedx3, cranial nerves  grossly intact. moves all 4 extremities w/o difficulty. Affect pleasant  Overall volume status looks only mildly elevated. Co-ox stable on DBA 5. Likely can go home soon.   Question will be now that he has insurance, is he a candidate for advanced therapies. RV ok on echo in past. Previously limited by poor compliance and lack of social support but now has improved somewhat.   Will continue IV diuresis. Repeat echo. D/w VAD/SW teams  Arvilla Meres, MD  10:54 AM

## 2022-04-09 NOTE — Assessment & Plan Note (Addendum)
Uses 2 L at home Currently requiring 4 L Wean to baseline as tolerated with goal sat >92%

## 2022-04-09 NOTE — Progress Notes (Signed)
Heart and Vascular Care Navigation  04/09/2022  Zachary Arias 1961/05/03 [id number]  Reason for Referral: CSW met with patient at bedside to discuss LVAD work up and assessment. Patient is participating in a Managed Medicaid Plan:Yes  Engaged with patient face to face for follow up visit for Heart and Vascular Care Coordination.                                                                                                   Assessment:  Patient is a 61 yo male who is single and lives alone. Patient reports he is currently receiving long term disability and has a pending Social Security disability application. He was recently approved for Medicaid although it was delayed secondary to citizenship issue/paperwork. He states he worked for a [employer] in [place] from 2004-2021 when he became disabled. He states he lived in [place] prior to 2004. Patient states he has a brother in [place] although he states he has not seen him in years but they do communicate via phone. He last spoke with his brother by phone a few days ago. He states his brother is not aware of current hospital admission.  He is able to cook his own meals and has online grocery shopping delivered to his doorstep. He states he lives in an apartment on the third floor walk up. He has been managing the steps with some difficulty due to health.  He has relied on [facility] for transportation until recently when he was approved for Medicaid which has a transportation benefit.   CSW discussed at length the LVAD and the need for 24/7 care post hospitalization for the first 2 weeks. CSW stressed the importance of a caregiver and explained the role of ongoing care for the driveline dressing changes.  Patient states he does not have any SO, family or friends that could manage the role of a caregiver. He spoke of his brother and that they have been estranged for years.                              HRT/VAS Care Coordination      Outpatient Care Team [facility] Paramedic Name: Zachary Arias- [phone]   Social Worker Name: Zachary Arias- Advanced [facility]- [phone]   Living arrangements for the past 2 months Apartment   Lives with: Self   Patient Current Insurance Coverage Self-Pay; Medicaid Pending   Does Patient Have Prescription Coverage? No   Patient Prescription Assistance Programs Heart Failure Fund   Other Assistance Programs Medications $10 copay card provided for [facility] Assistive Devices/Equipment None       Social History:  SDOH Screenings   Alcohol Screen: Not on file  Depression (JQB3-4): Not on file  Financial Resource Strain: High Risk (01/21/2022)   Overall Financial Resource Strain (CARDIA)    Difficulty of Paying Living Expenses: Hard  Food Insecurity: Food Insecurity Present (01/21/2022)   Hunger Vital Sign    Worried About Running Out of Food in the Last Year: Sometimes true    Ran Out of Food in the Last Year: Sometimes true  Housing: Low Risk  (01/21/2022)   Housing    Last Housing Risk Score: 0  Physical Activity: Not on file  Social Connections: Not on file  Stress: Not on file  Tobacco Use: Low Risk  (04/09/2022)   Patient History    Smoking Tobacco Use: Never    Smokeless Tobacco Use: Never    Passive Exposure: Not on file  Transportation Needs: Unmet Transportation Needs (03/08/2022)   PRAPARE - Hydrologist (Medical): Yes    Lack of Transportation (Non-Medical): Yes    SDOH Interventions: Financial Resources:   Patient has pending SSD and current with [employer].   Food Insecurity:  N/a   Housing Insecurity:   N/a  Transportation:    Will have Medicaid Transportation   Follow-up plan:  Patient appeared overwhelmed and exhausted with lengthy discussions today about LVAD. He states he will absorb the information and consider  contacting his brother about the possibility of caregiver although states that "it is a 50/50 that he would agree". CSW will follow back up with patient on Monday for further conversations. Zachary Arias, [place], [place]

## 2022-04-09 NOTE — Consult Note (Signed)
Cardiology Consultation:   Patient ID: Zachary Arias MRN: [id number]; DOB: Apr 30, 1961  Admit date: 04/08/2022 Date of Consult: 04/09/2022  PCP:  Default, Provider, MD   [facility] Providers Cardiologist:  None  Advanced Heart Failure:  Glori Bickers, MD       Patient Profile:   Zachary Arias is a 61 y.o. HF due to mixed ischemic/no-ischemic CM (diagnosed 9/10) with EF 25-30% range. Cath with 1-v CAD with chronically occluded RCA. MRI in January 2011 with EF 31% with inferior scar . He  is being seen 04/09/2022 for the evaluation of HF exacerbation at the request of Dr Cyd Silence.  History of Present Illness:   [name] has h/o cardiomyopathy with most recent LVEF ~30-35%, inotrope dependent on home dobutamine, end-stage in regard to his HF and not a candidate for advanced therapies and currently enrolled in hospice care. He follows w/ Dr. Rebekah Chesterfield team. He presented to ED today c/o several days of generalized weakness and subjectively worsening SOB. He states he has been compliant w/ all his medications, including the home dobutamine, and has not missed any doses. In the past, he has been admitted for decompensated HF due to non-compliance w/ medical therapy. Pt states he thinks his breathing is worse than normal, and it doesn't seem to help even when he bumps his 2L of O2 up to 3L (although O2 sat is 99% in the ED on 2L [place]). He has not tried taking any extra doses of diuretics since he began to feel SOB. He has not had significant worsening LE edema.he has received a dose of lasix 20mg  IV x 1 in the ED, and there is an outstanding order for a second dose of 40mg  IV x 1 that is pending to be given. Pt says he has yet to make any urine in response to the lasix.   Past Medical History:  Diagnosis Date   Congestive heart failure (CHF) ([place])    secondary to ischemic cardiomyopathy with an ejection fraction of 25-30%   Coronary artery disease    History of hypertension     Inguinal hernia 2005   History of left inguinal hernia, status post repair in 2005   Tachycardia 2010     Unexplained tachycardia during hospitalization    Past Surgical History:  Procedure Laterality Date   CARDIAC CATHETERIZATION   06/27/2009   ejection fraction of approximately 35%   INGUINAL HERNIA REPAIR  01/27/2004   History of left inguinal hernia, status post repair in 2005   RIGHT/LEFT HEART CATH AND CORONARY ANGIOGRAPHY N/A 03/10/2020   Procedure: RIGHT/LEFT HEART CATH AND CORONARY ANGIOGRAPHY;  Surgeon: Jolaine Artist, MD;  Location: [place] [facility];  Service: Cardiovascular;  Laterality: N/A;     Home Medications:  Prior to Admission medications   Medication Sig Start Date End Date Taking? Authorizing Provider  amiodarone (PACERONE) 200 MG tablet Take 1 tablet (200 mg total) by mouth 2 (two) times daily. 12/21/21   Bensimhon, Shaune Pascal, MD  ASPIRIN 81 PO Take 81 mg by mouth daily.    [provider]  atorvastatin (LIPITOR) 40 MG tablet Take 1 tablet (40 mg total) by mouth daily. 11/25/21   Lyda Jester M, PA-C  dapagliflozin propanediol (FARXIGA) 10 MG TABS tablet Take 1 tablet (10 mg total) by mouth daily for the heart. 03/22/22   Bensimhon, Shaune Pascal, MD  digoxin (LANOXIN) 0.125 MG tablet Take 1/2 tablets (0.0625 mg total) by mouth daily. 03/08/22   Bensimhon, Shaune Pascal, MD  DOBUTamine (DOBUTREX) 4-5 MG/ML-% infusion Inject 344.5 mcg/min into the vein continuous. 02/04/22   Consuelo Pandy, PA-C  Multiple Vitamin (MULTIVITAMIN) capsule Take 1 capsule by mouth daily.    [provider]  potassium chloride SA (KLOR-CON M) 20 MEQ tablet Take 2 tablets (40 mEq total) by mouth daily. 02/17/22   Rafael Bihari, FNP  spironolactone (ALDACTONE) 25 MG tablet Take 1/2 tablet (12.5 mg total) by mouth daily. 02/04/22   Lyda Jester M, PA-C  torsemide (DEMADEX) 20 MG tablet 2 tablets by mouth every morning and 1 tablet by mouth every evening.    [provider]    Inpatient Medications: Scheduled Meds:  furosemide  40 mg Intravenous Once   Followed by   furosemide  60 mg Intravenous BID   Continuous Infusions:  DOBUTamine     PRN Meds:   Allergies:    Allergies  Allergen Reactions   Avocado Nausea Only   Shellfish Allergy Nausea And Vomiting    Social History:   Social History   Socioeconomic History   Marital status: Single    Spouse name: Not on file   Number of children: Not on file   Years of education: Not on file   Highest education level: Not on file  Occupational History   Not on file  Tobacco Use   Smoking status: Never   Smokeless tobacco: Never  Vaping Use   Vaping Use: Not on file  Substance and Sexual Activity   Alcohol use: Not Currently   Drug use: Not Currently   Sexual activity: Not on file  Other Topics Concern   Not on file  Social History Narrative   Not on file   Social Determinants of Health   Financial Resource Strain: High Risk (01/21/2022)   Overall Financial Resource Strain (CARDIA)    Difficulty of Paying Living Expenses: Hard  Food Insecurity: Food Insecurity Present (01/21/2022)   Hunger Vital Sign    Worried About Running Out of Food in the Last Year: Sometimes true    Ran Out of Food in the Last Year: Sometimes true  Transportation Needs: Unmet Transportation Needs (03/08/2022)   PRAPARE - Hydrologist (Medical): Yes    Lack of Transportation (Non-Medical): Yes  Physical Activity: Not on file  Stress: Not on file  Social Connections: Not on file  Intimate Partner Violence: Not on file    Family History:    Family History  Problem Relation Age of Onset   Cancer Mother        Ovarian Cancer   Heart disease Father    Heart attack Father    Cardiomyopathy Brother      ROS:  Please see the history of present illness.   All other ROS reviewed and negative.     Physical Exam/Data:   Vitals:   04/08/22 2215 04/08/22 2245  04/08/22 2300 04/08/22 2320  BP: 104/70 100/72 98/72   Pulse: 81 81 81   Resp: 16 16 15    Temp:      TempSrc:      SpO2: 100% 100% 100%   Weight:    64.4 kg  Height:    5\' 8"  ([id number] m)   No intake or output data in the 24 hours ending 04/09/22 0016    04/08/2022   11:20 PM 03/09/2022    3:01 PM 02/25/2022   10:10 AM  Last 3 Weights  Weight (lbs) 142 lb 143 lb 9.6 oz 142  lb  Weight (kg) 64.411 kg 65.137 kg 64.411 kg     Body mass index is 21.59 kg/m.  General:  Well nourished, well developed, in no acute distress HEENT: normal Neck: + JVD Vascular: No carotid bruits; Distal pulses 2+ bilaterally Cardiac:  PMI laterally displaced. normal S1, S2; RRR; 2/6 sys murmur along LSB and at apex Lungs:  clear to auscultation bilaterally, no wheezing, rhonchi or rales  Abd: soft, nondistended, nontender, no hepatomegaly  Ext: tr bilateral edema Musculoskeletal:  No deformities Skin: warm and dry  Neuro:  no focal abnormalities noted Psych:  Normal affect   EKG:  The EKG was personally reviewed and demonstrates:  NSR, biatrial enlargement, lateral TWI Telemetry:  Telemetry was personally reviewed and demonstrates:  SR/ST  Relevant CV Studies: TTE 01-18-22 1. Left ventricular ejection fraction, by estimation, is 30 to 35%. The  left ventricle has moderately decreased function. The left ventricle  demonstrates global hypokinesis. The left ventricular internal cavity size  was severely dilated. Left  ventricular diastolic parameters are indeterminate. Elevated left  ventricular end-diastolic pressure.   2. Right ventricular systolic function is normal. The right ventricular  size is mildly enlarged. There is severely elevated pulmonary artery  systolic pressure. The estimated right ventricular systolic pressure is  64.0 mmHg.   3. Left atrial size was severely dilated.   4. The pericardial effusion is posterior to the left ventricle.   5. The mitral valve is degenerative. Severe mitral  valve regurgitation.  No evidence of mitral stenosis.   6. Tricuspid valve regurgitation is moderate.   7. The aortic valve is normal in structure. Aortic valve regurgitation is  trivial. No aortic stenosis is present.   8. The inferior vena cava is dilated in size with <50% respiratory  variability, suggesting right atrial pressure of 15 mmHg.   RHC/LHC 03-10-20 Prox RCA lesion is 50% stenosed. Dist RCA lesion is 100% stenosed. Mid Cx lesion is 30% stenosed. Mid LAD lesion is 40% stenosed. 1st Mrg lesion is 30% stenosed.   Findings:   On milrinone 0.25 mcg/kg/min   Ao = 86/61 (72) LV = 87/7 RA = 1 RV = 28/1 PA = 27/10 (18) PCW = 10 Fick cardiac output/index = 7.4/4.0 PVR = 1.0 WU SVR = 767 Ao sat = 95% PA sat = 75%, 77% SVC sat = 81%   Assessment: 1. CAD with chronically occluded distal RCA and moderate non-obstructive CAD elsewhere 2. Severe primarily non-ischemic CM EF 20% 3. Low filling pressures and normal cardiac output on milrinone 0.25  Laboratory Data:  High Sensitivity Troponin:   Recent Labs  Lab 04/08/22 2055 04/08/22 2234  TROPONINIHS 28* 33*     Chemistry Recent Labs  Lab 04/08/22 2055  NA 143  K 4.2  CL 109  CO2 20*  GLUCOSE 139*  BUN 24*  CREATININE 1.82*  CALCIUM 9.0  GFRNONAA 42*  ANIONGAP 14   Baseline Cr ~1.6-1.7 per notes  No results for input(s): "PROT", "ALBUMIN", "AST", "ALT", "ALKPHOS", "BILITOT" in the last 168 hours. Lipids No results for input(s): "CHOL", "TRIG", "HDL", "LABVLDL", "LDLCALC", "CHOLHDL" in the last 168 hours.  Hematology Recent Labs  Lab 04/08/22 2055  WBC 9.6  RBC 4.45  HGB 12.2*  HCT 38.2*  MCV 85.8  MCH 27.4  MCHC 31.9  RDW 17.2*  PLT 282   Thyroid No results for input(s): "TSH", "FREET4" in the last 168 hours.  BNP Recent Labs  Lab 04/08/22 2055  BNP 3,236.0*  DDimer No results for input(s): "DDIMER" in the last 168 hours.   Radiology/Studies:  No results found.   Assessment and  Plan:   SOB/cardiomyopathpy/chronic systolic HF end-stage: pt may be mildly congested, but he does not appear to be grossly decompensated. Would cont home dobutamine regimen 74mcg/kg/min. He has received lasix 20mg  x 1 and has an additional 40mg  IV dose pending. HF team plans to see pt in the AM for further recommendations. For now would cont his home HF med regimen w/o any significant changes. H/o frequent PVCs: on amiodarone 200mg  bid.  CAD: h/o CTO RCA; no acute anginal sx at present. Dispo: pt has been under hospice care but there has been a recent changeover in companies due to insurance issues. Some of this may need to be sorted through prior to discharge.   Risk Assessment/Risk Scores:        New York Heart Association (NYHA) Functional Class NYHA Class IV        For questions or updates, please contact [facility] Please consult [email or web address] for contact info under    Signed, , MD, [facility] 04/09/2022 12:16 AM

## 2022-04-09 NOTE — Assessment & Plan Note (Addendum)
Slightly elevated serial troponins with flat trajectory of elevation Patient is chest pain-free Likely secondary to advanced heart failure, plaque rupture is unlikely

## 2022-04-10 ENCOUNTER — Inpatient Hospital Stay (HOSPITAL_COMMUNITY): Payer: Medicaid Other

## 2022-04-10 DIAGNOSIS — I5023 Acute on chronic systolic (congestive) heart failure: Secondary | ICD-10-CM | POA: Diagnosis not present

## 2022-04-10 DIAGNOSIS — N189 Chronic kidney disease, unspecified: Secondary | ICD-10-CM

## 2022-04-10 DIAGNOSIS — I493 Ventricular premature depolarization: Secondary | ICD-10-CM

## 2022-04-10 DIAGNOSIS — R638 Other symptoms and signs concerning food and fluid intake: Secondary | ICD-10-CM

## 2022-04-10 DIAGNOSIS — N179 Acute kidney failure, unspecified: Secondary | ICD-10-CM

## 2022-04-10 DIAGNOSIS — I2510 Atherosclerotic heart disease of native coronary artery without angina pectoris: Secondary | ICD-10-CM | POA: Diagnosis not present

## 2022-04-10 DIAGNOSIS — J9621 Acute and chronic respiratory failure with hypoxia: Secondary | ICD-10-CM | POA: Diagnosis not present

## 2022-04-10 DIAGNOSIS — Z66 Do not resuscitate: Secondary | ICD-10-CM

## 2022-04-10 LAB — ECHOCARDIOGRAM COMPLETE
AR max vel: 1.66 cm2
AV Area VTI: 1.49 cm2
AV Area mean vel: 1.81 cm2
AV Mean grad: 2.4 mmHg
AV Peak grad: 5.9 mmHg
Ao pk vel: 1.21 m/s
Calc EF: 49.4 %
Height: 68 in
MV M vel: 4.38 m/s
MV Peak grad: 76.7 mmHg
Radius: 1.2 cm
S' Lateral: 5.6 cm
Single Plane A2C EF: 45.9 %
Single Plane A4C EF: 48.3 %
Weight: 2296 oz

## 2022-04-10 LAB — BASIC METABOLIC PANEL
Anion gap: 11 (ref 5–15)
BUN: 30 mg/dL — ABNORMAL HIGH (ref 6–20)
CO2: 23 mmol/L (ref 22–32)
Calcium: 8.9 mg/dL (ref 8.9–10.3)
Chloride: 109 mmol/L (ref 98–111)
Creatinine, Ser: 2 mg/dL — ABNORMAL HIGH (ref 0.61–1.24)
GFR, Estimated: 38 mL/min — ABNORMAL LOW (ref >60)
Glucose, Bld: 113 mg/dL — ABNORMAL HIGH (ref 70–99)
Potassium: 4.3 mmol/L (ref 3.5–5.1)
Sodium: 143 mmol/L (ref 135–145)

## 2022-04-10 MED ORDER — PERFLUTREN LIPID MICROSPHERE
1.0000 mL | INTRAVENOUS | Status: AC | PRN
  Administered 2022-04-10: 2 mL via INTRAVENOUS

## 2022-04-10 NOTE — Assessment & Plan Note (Signed)
Continue with amiodarone.  Telemetry monitoring.

## 2022-04-10 NOTE — Progress Notes (Signed)
Daily Progress Note   Patient Name: Zachary Arias       Date: 04/10/2022 DOB: 07/08/1961  Age: 61 y.o. MRN#: [id number] Attending Physician: Tawni Millers Primary Care Physician: Default, Provider, MD Admit Date: 04/08/2022  Reason for Consultation/Follow-up: Establishing goals of care, "end stage heart failure"  Subjective: Chart review performed. Received report from primary RN - no acute concerns. Per RN, patient eating better today.  Went to visit patient at bedside - no family/visitors present. Patient was sitting on edge of bed awake, alert, oriented and able to participate in conversation. No signs or non-verbal gestures of pain or discomfort noted. No respiratory distress, increased work of breathing, or secretions noted. Patient tells me his shortness of breath is "in the background" and much improved since yesterday. He remains on 2L O2 [place], which is his baseline. Patient also expresses joy, that due to improved shortness of breath, he has had increased energy to eat and brush his teeth today.   Therapeutic listening and emotional support provided to patient. We reviewed his conversation with Dr. Haroldine Laws today. It has been discussed, along with LVAD, heart transplant as another option for advanced therapies patient can now pursue. Patient has given thoughtful consideration after discussion with Dr. Haroldine Laws today on which option he would prefer to pursue. Patient is clear he is open to pursuing advanced therapies now that insurance can assist with cost over hospice care. Answered questions patient had around heart transplant in context of COVID vaccination status. We discussed utmost importance for medication compliance with both options - patient confirms he is very motivated to  comply with medication and recommendations as he understands the gravity of consequences if he is non-compliant. Patient understands if heart transplant is pursued, his care would need to be transferred to [facility] - we discussed transportation and this is a concern for patient. He is not sure if insurance will pay for transportation to [place] - will get TOC involved for additional information. Discussed transplant wait list and what to anticipate pre- and post- procedure. He also understands, if LVAD is pursued, he could continue care at [facility]. At this time, patient has decided his first choice is work up for heart transplant and second choice is work up for LVAD.   Concepts specific to advanced directives, code status, artificial feeding/nutrition, and rehospitalization was  discussed. Patient does not have a Living Will - education on Living Will provided. He does not have HCPOA but would want his brother/Zachary Arias to be surrogate Management consultant. Offered assistance completing Living Will and HCPOA - patient declined at this time but is considering.   Encouraged patient to consider DNR/DNI status understanding evidenced based poor outcomes in similar hospitalized patient, as the cause of arrest is likely associated with advanced chronic/terminal illness rather than an easily reversible acute cardio-pulmonary event. I explained that DNR/DNI does not change the medical plan and it only comes into effect after a person has arrested (died).  It is a protective measure to keep [place] from harming the patient in their last moments of life. Patient was agreeable to DNR/DNI with understanding that he would not receive CPR, defibrillation, ACLS medications, or intubation. Patient was clear that he "does not want to suffer."  We discussed diet recommendations when patient does feel too fatigued to eat - education provided that moist foods or those with sauce/gravy take less energy to eat, encouraged assistance with meal  preparation to conserve energy to eat. Patient expressed understanding.  All questions and concerns addressed. Encouraged to call with questions and/or concerns. PMT card previously provided.  Length of Stay: 2  Current Medications: Scheduled Meds:   amiodarone  200 mg Oral BID   aspirin EC  81 mg Oral Daily   atorvastatin  40 mg Oral Daily   Chlorhexidine Gluconate Cloth  6 each Topical Daily   dapagliflozin propanediol  10 mg Oral Daily   digoxin  0.0625 mg Oral Daily   enoxaparin (LOVENOX) injection  40 mg Subcutaneous Q24H   spironolactone  12.5 mg Oral Daily    Continuous Infusions:  DOBUTamine 5 mcg/kg/min (04/09/22 0903)    PRN Meds: acetaminophen **OR** acetaminophen, LORazepam, ondansetron **OR** ondansetron (ZOFRAN) IV, polyethylene glycol  Physical Exam Vitals and nursing note reviewed.  Constitutional:      General: He is not in acute distress. Pulmonary:     Effort: No respiratory distress.  Skin:    General: Skin is warm and dry.  Neurological:     Mental Status: He is alert and oriented to person, place, and time.     Motor: Weakness present.  Psychiatric:        Attention and Perception: Attention normal.        Behavior: Behavior is cooperative.        Cognition and Memory: Cognition and memory normal.             Vital Signs: BP 93/73 (BP Location: Left Arm)   Pulse 87   Temp 97.6 F (36.4 C) (Oral)   Resp 13   Ht 5\' 8"  (1.727 m)   Wt 65.1 kg   SpO2 100%   BMI 21.82 kg/m  SpO2: SpO2: 100 % O2 Device: O2 Device: Nasal Cannula O2 Flow Rate: O2 Flow Rate (L/min): 3 L/min  Intake/output summary:  Intake/Output Summary (Last 24 hours) at 04/10/2022 1700 Last data filed at 04/10/2022 1236 Gross per 24 hour  Intake 400.25 ml  Output 1850 ml  Net -1449.75 ml   LBM: Last BM Date : 04/09/22 Baseline Weight: Weight: 64.4 kg Most recent weight: Weight: 65.1 kg       Palliative Assessment/Data: PPS 50%      Patient Active Problem List    Diagnosis Date Noted   Frequent PVCs 04/10/2022   Acute kidney injury superimposed on chronic kidney disease (HCC) 04/09/2022   Acute on  chronic respiratory failure with hypoxia (HCC) 04/09/2022   Goals of care, counseling/discussion    Acute on chronic systolic (congestive) heart failure (HCC) 04/08/2022   Right ankle pain 01/27/2022   Dyslipidemia 01/27/2022   Hypotension 03/05/2020   Hyperkalemia 03/05/2020   Elevated WBC count 03/05/2020   Chronic bilateral pleural effusions    Coronary artery disease involving native coronary artery of native heart without angina pectoris 07/22/2009   SYSTOLIC HEART FAILURE, CHRONIC 07/22/2009   HYPERTENSION 07/07/2009   CAD 07/07/2009   TACHYCARDIA 07/07/2009    Palliative Care Assessment & Plan   Patient Profile: 61 y.o. male  with past medical history of advanced systolic congestive heart failure  (echo 12/2021 EF 30-35%) on continuous dobutamine infusion, coronary artery disease (cath 02/2020 dist RCA 100% stenosed, prox RCA 50% stenosed, Mid Cx/Mid LAD/1st Mrg all 30-40%), chronic hypoxic respiratory failure (on 2L via  at home), chronic kidney disease stage III presented to ED on 04/08/2022 from home with complaints of generalized weakness x several days and also with increased shortness of breath.  Patient was admitted on 04/08/2022 with acute on chronic systolic heart failure, elevated troponin level not due to myocardial infarction, coronary artery disease involving native coronary artery of native heart without angina pectoris.    Of note, patient had recent prolonged hospitalization from 4/23 -02/04/2022 for acute congestive heart failure and cardiogenic shock.  He was ultimately discharged home with a PICC line, continuous dobutamine infusion, and hospice services.  Patient was originally admitted into [facility]; however, was discharged and was in the process of getting admitted with [facility].  He was scheduled to be admitted with  [facility] hospice 04/09/2022.  Unfortunately, he was not enrolled due to being hospitalized 1 day prior to admission.  Assessment: Principal Problem:   Acute on chronic systolic (congestive) heart failure (HCC) Active Problems:   Coronary artery disease involving native coronary artery of native heart without angina pectoris   Acute kidney injury superimposed on chronic kidney disease (HCC)   Acute on chronic respiratory failure with hypoxia (HCC)   Goals of care, counseling/discussion   Frequent PVCs   Recommendations/Plan: Continue to treat the treatable  Now DNR/DNI - durable DNR form completed and placed in shadow chart. Copy was made and will be scanned into Vynca/ACP tab Patient is open to pursing advanced heart failure therapies. He would first prefer work up for heart transplant; second choice is work up for LVAD [facility] consulted for: patient is unsure of transportation options to/from [facility] if heart transplant is pursued  PMT will continue to follow and support holistically  Goals of Care and Additional Recommendations: Limitations on Scope of Treatment: Full Scope Treatment  Code Status:    Code Status Orders  (From admission, onward)           Start     Ordered   04/10/22 1657  Do not attempt resuscitation (DNR)  Continuous       Question Answer Comment  In the event of cardiac or respiratory ARREST Do not call a "code blue"   In the event of cardiac or respiratory ARREST Do not perform Intubation, CPR, defibrillation or ACLS   In the event of cardiac or respiratory ARREST Use medication by any route, position, wound care, and other measures to relive pain and suffering. May use oxygen, suction and manual treatment of airway obstruction as needed for comfort.      04/10/22 1656           Code  Status History     Date Active Date Inactive Code Status Order ID Comments User Context   04/09/2022 0349 04/10/2022 1656 Full Code [id number]  Vernelle Emerald, MD ED    01/17/2022 1630 02/04/2022 2324 Full Code [id number]  Lequita Halt, MD ED   03/05/2020 0238 03/13/2020 2122 Full Code [id number]  Aldean Jewett, MD ED       Prognosis:  Unable to determine  Discharge Planning: To Be Determined  Care plan was discussed with primary RN, patient, Dr. Cathlean Sauer, [facility]  Thank you for allowing the Palliative Medicine Team to assist in the care of this patient.   Total Time 60 minutes Prolonged Time Billed  no       Greater than 50%  of this time was spent counseling and coordinating care related to the above assessment and plan.  Lin Landsman, NP  Please contact Palliative Medicine Team phone at [phone] for questions and concerns.   *Portions of this note are a verbal dictation therefore any spelling and/or grammatical errors are due to the "[place]" system interpretation.

## 2022-04-10 NOTE — Progress Notes (Signed)
  Echocardiogram 2D Echocardiogram has been performed.  Roosvelt Maser F 04/10/2022, 1:55 PM

## 2022-04-10 NOTE — Assessment & Plan Note (Addendum)
04/23 echocardiogram with LV systolic function 30 to 35%, global hypokinesis, preserved RV systolic function, severe enlargement of pulmonary artery with RVSP 64,0 mmHg. Severe LA enlargement. Severe mitral regurgitation, moderate TR, posterior pericardial effusion.   Troponin elevation due to heart failure decompensation.   Severe pulmonary hypertension, possible class 2 (acute on chronic core pulmonale).   Urine output is 2,550 ml  Systolic blood pressure is 97 to 104  mmHg.   Medical therapy with dobutamine, digoxin, dapagliflozin and spironolactone.  Continue telemetry monitoring.   Plan for right and left heart catheterization and TEE for further evaluation mitral valve, he may be candidate for intervention that can improve his heart function and wean off dobutamine.

## 2022-04-10 NOTE — Progress Notes (Signed)
Progress Note   Patient: Zachary Arias ZJI:[id number] DOB: 12-01-1960 DOA: 04/08/2022     2 DOS: the patient was seen and examined on 04/10/2022   Brief hospital course: Mr. Cihlar was admitted to the hospital with the working diagnosis of decompensated heart failure.   61 year old male with past medical history of advanced systolic congestive heart failure (Echo 12/2021 EF 30-35%) on continuous dobutamine infusion, (end stage and inotropic dependent) coronary artery disease (cath 02/2020 Dist RCA 100% stenosed, prox RCA 50% stenosed, Mid Cx/Mid LAD/1st Mrg all 30-40%), chronic hypoxic respiratory failure (on 2lpm via [place] at home), chronic kidney disease stage IIIa who presents with dyspnea.  Recent hospitalization 04/23 to 02/04/22 for heart failure with low output cardiogenic shock, he was diuresed and discharged home with continuous infusion of dobutamine along with hospice.  Reported 3 to 4 days of worsening dyspnea and weakness, worse with exertion, refractive to a flow increase in his home supplemental 02. On his initial physical examination his blood pressure was 99.75, HR 85, RR 15 and 02 saturation 100%, lungs with rales bilateral with no wheezing, increase work of breathing, heart with S1 and S2 present with no gallops or murmurs, no abdominal distention and positive lower extremity edema.   Na 145, K 3,8 CL 109, bicarbonate 23, glucose 115 bun 22 cr 1,65 AST 94, ALT 226, total Bil 1,7  BNP 3,236  High sensitive troponin 28 and 33  Wbc 98 hgb 12,5 plt 269  Sars covid 19 negative   Chest radiograph with cardiomegaly, bilateral hilar vascular congestion, no infiltrates or effusions.   EKG 87 bpm, normal axis, qtc 577, sinus rhythm with left atrial enlargement, ST depression V5 and V6 with no significant T wave changes.   Assessment and Plan: * Acute on chronic systolic (congestive) heart failure (HCC) 04/23 echocardiogram with LV systolic function 30 to 35%, global hypokinesis, preserved  RV systolic function, severe enlargement of pulmonary artery with RVSP 64,0 mmHg. Severe LA enlargement. Severe mitral regurgitation, moderate TR, posterior pericardial effusion.   Troponin elevation due to heart failure decompensation.   Pulmonary hypertension.   Urine output is 1,500 ml  Systolic blood pressure is 99 to 93 mmHg.   Plan to continue heart failure management with dobutamine, digoxin, dapagliflozin and spironolactone.  Continue telemetry monitoring.  Holding furosemide for today.  Follow with further heart failure recommendations in regards for possible advance therapies.   Coronary artery disease involving native coronary artery of native heart without angina pectoris Continue with aspirin and statin.    Acute kidney injury superimposed on chronic kidney disease (HCC) CKD stage 3b   Renal function with serum cr at 2.0 with K at 4,3 and serum bicarbonate at 23.  Plan to hold on furosemide for today and follow up on renal function in am. Continue with spironolactone and SGLT 2 inh.    Acute on chronic respiratory failure with hypoxia (HCC) Uses 2 L at home Currently requiring 4 L Wean to baseline as tolerated with goal sat >92%   Frequent PVCs Continue with amiodarone.  Telemetry monitoring.   Goals of care, counseling/discussion Patient has been in hospice but now considering possible advance heart failure interventions.         Subjective: Patient with improvement in dyspnea, no chest pain, improved edema lower extremities. At home apparently with increased water intake.   Physical Exam: Vitals:   04/10/22 0504 04/10/22 0828 04/10/22 0939 04/10/22 1233  BP: 109/68 99/75  93/73  Pulse: 82 84 89 87  Resp: 20 20  13   Temp: 98.5 F (36.9 C) 97.6 F (36.4 C)    TempSrc: Oral Oral    SpO2: 100% 100%  100%  Weight: 65.1 kg     Height:       Neurology awake and alert ENT with no pallor Cardiovascular with S1 and S2 present and rhythmic with no  gallops, positive systolic murmur at the apex 3/6  Mild JVD Trace lower extremity edema Respiratory with no rales or wheezing Abdomen not distended  Data Reviewed:    Family Communication: no family at the bedside   Disposition: Status is: Inpatient Remains inpatient appropriate because: heart failure   Planned Discharge Destination: Home    Author: , MD 04/10/2022 2:53 PM  For on call review [email or web address].

## 2022-04-10 NOTE — Progress Notes (Signed)
Advanced Heart Failure Rounding Note  PCP-Cardiologist: Dr. Haroldine Laws   Subjective:    Remains on DBA. Co-ox ok   Denies CP or SOB.   SCr 1.65 -> 2.0. Urine dark.    Objective:   Weight Range: 65.1 kg Body mass index is 21.82 kg/m.   Vital Signs:   Temp:  [96.7 F (35.9 C)-98.5 F (36.9 C)] 97.6 F (36.4 C) (07/15 0828) Pulse Rate:  [81-89] 84 (07/15 0828) Resp:  [13-20] 20 (07/15 0828) BP: (90-109)/(65-80) 99/75 (07/15 0828) SpO2:  [98 %-100 %] 100 % (07/15 0828) Weight:  [65.1 kg-65.2 kg] 65.1 kg (07/15 0504) Last BM Date : 04/09/22  Weight change: Filed Weights   04/08/22 2320 04/09/22 1321 04/10/22 0504  Weight: 64.4 kg 65.2 kg 65.1 kg    Intake/Output:   Intake/Output Summary (Last 24 hours) at 04/10/2022 0852 Last data filed at 04/10/2022 0839 Gross per 24 hour  Intake 798.95 ml  Output 1250 ml  Net -451.05 ml      Physical Exam    General:  Sitting up in bed  No resp difficulty HEENT: normal Neck: supple. no JVD. Carotids 2+ bilat; no bruits. No lymphadenopathy or thryomegaly appreciated. Cor: PMI laterally displaced. Regular rate & rhythm. +s3 2/6 TR Lungs: clear Abdomen: soft, nontender, nondistended. No hepatosplenomegaly. No bruits or masses. Good bowel sounds. Extremities: no cyanosis, clubbing, rash, edema Neuro: alert & orientedx3, cranial nerves grossly intact. moves all 4 extremities w/o difficulty. Affect pleasant   Telemetry   NSR 80-90s Personally reviewed  Labs    CBC Recent Labs    04/08/22 2055 04/09/22 0403  WBC 9.6 9.8  NEUTROABS 7.2 7.0  HGB 12.2* 12.5*  HCT 38.2* 38.2*  MCV 85.8 86.6  PLT 282 323    Basic Metabolic Panel Recent Labs    04/09/22 0403 04/10/22 0340  NA 145 143  K 3.8 4.3  CL 109 109  CO2 23 23  GLUCOSE 115* 113*  BUN 22* 30*  CREATININE 1.65* 2.00*  CALCIUM 8.7* 8.9  MG 2.5*  --     Liver Function Tests Recent Labs    04/09/22 0403  AST 94*  ALT 226*  ALKPHOS 139*   BILITOT 1.7*  PROT 5.6*  ALBUMIN 3.3*    No results for input(s): "LIPASE", "AMYLASE" in the last 72 hours. Cardiac Enzymes No results for input(s): "CKTOTAL", "CKMB", "CKMBINDEX", "TROPONINI" in the last 72 hours.  BNP: BNP (last 3 results) Recent Labs    02/12/22 1432 03/09/22 1540 04/08/22 2055  BNP 562.1* 503.3* 3,236.0*     ProBNP (last 3 results) No results for input(s): "PROBNP" in the last 8760 hours.   D-Dimer No results for input(s): "DDIMER" in the last 72 hours. Hemoglobin A1C No results for input(s): "HGBA1C" in the last 72 hours. Fasting Lipid Panel No results for input(s): "CHOL", "HDL", "LDLCALC", "TRIG", "CHOLHDL", "LDLDIRECT" in the last 72 hours. Thyroid Function Tests No results for input(s): "TSH", "T4TOTAL", "T3FREE", "THYROIDAB" in the last 72 hours.  Invalid input(s): "FREET3"  Other results:   Imaging    DG CHEST PORT 1 VIEW  Result Date: 04/09/2022 CLINICAL DATA:  Shortness of breath, generalized weakness EXAM: PORTABLE CHEST 1 VIEW COMPARISON:  Previous studies including the examination of 04/08/2022 FINDINGS: Transfer diameter heart is increased. There is slight decrease in pulmonary vascular congestion. There are no signs of pulmonary edema or new focal infiltrates. There is no pleural effusion or pneumothorax. Tip of PICC line is seen in superior  vena cava. IMPRESSION: Cardiomegaly. There is interval decrease in pulmonary vascular congestion. There are no signs of alveolar pulmonary edema or new focal infiltrates. Electronically Signed   By: Elmer Picker M.D.   On: 04/09/2022 13:53     Medications:     Scheduled Medications:  amiodarone  200 mg Oral BID   aspirin EC  81 mg Oral Daily   atorvastatin  40 mg Oral Daily   Chlorhexidine Gluconate Cloth  6 each Topical Daily   dapagliflozin propanediol  10 mg Oral Daily   digoxin  0.0625 mg Oral Daily   enoxaparin (LOVENOX) injection  40 mg Subcutaneous Q24H   furosemide  80 mg  Intravenous BID   spironolactone  12.5 mg Oral Daily    Infusions:  DOBUTamine 5 mcg/kg/min (04/09/22 0903)    PRN Medications: acetaminophen **OR** acetaminophen, LORazepam, ondansetron **OR** ondansetron (ZOFRAN) IV, polyethylene glycol    Patient Profile   61 y.o. 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 Cath with 1-v CAD with chronically occluded RCA. MRI in January 2011 with EF 31% with inferior scar. Recent admit 4/23 for a/c CHF w/ low output, requiring DBA. He is end-stage and inotrope dependent, on home DBA. Not a good candidate for advanced therapies with lack of support system, and non-citizen status and does not want this. Has been followed by home hospice.   Presented to ED w/ CC of progressive dyspnea and increased supp O2 requirements.   BNP 3,236. CXR w/ vascular congestion.    Assessment/Plan   1. Acute on Chronic Systolic Heart Failure - Cath with 1-v CAD with chronically occluded RCA. MRI in January 2011 with EF 31% with inferior scar.  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   - Echo (8/13): EF 45-50% .  - No f/u between to 2015-2021 - Echo (6/21): EF 20-25% severe central MR. Repeat cath 6/21 with stable CAD - Echo (4/22): EF 40-45% mild to mod MR RV mild HK  - Echo (4/23): EF 30-35%.   - Admit 4/23 with shock, started on DBA and lasix. Diuresed 20 lbs. Unable to wean DBA - He is end-stage and inotrope dependent. - On Home DBA 5 mcg/kg/min via single lumen PICC. Under hospice care - Admitted with a/c HF. Diuresed with IV lasix - Co-ox 76% - Appears dry now. Stop lasix  - Continue Farxiga 10 mg daily  - Continue digoxin 0.0625 mg daily.  - Continue spiro 12.5 mg daily. - Long discussion with him again today. He is interested in advanced therapies now. We discussed previous barriers: lack of insurance, non-compliance and lack of  supportive caregiver. I met with SW. He now has insurance to cover. We discussed pros/cons transplant vs VAD. He will think about this - Repeat echo. Possible R/L cath this admit   2. AKI on CKD Stage IIIa likely due to ATN/overdiuresis - Creatinine baseline 1.6 - 1.82 on admit>>1.65-> 2.00 - hold lasix - follow BMP    3. PVCs - Continue amio 200 mg bid.  4. CAD  - Cath with 1-v CAD with chronically occluded RCA - No s/s angina - Continue ASA and atorvastatin.   5. Transaminitis - suspect due to passive congestion vs low output (co-ox ok)  Length of Stay: 2  Glori Bickers, MD  04/10/2022, 8:52 AM  Advanced Heart Failure Team Pager [phone] (M-F; 7a - 5p)  Please contact [facility] for night-coverage after  hours (5p -7a ) and weekends on [email or web address]

## 2022-04-11 DIAGNOSIS — J9621 Acute and chronic respiratory failure with hypoxia: Secondary | ICD-10-CM | POA: Diagnosis not present

## 2022-04-11 DIAGNOSIS — I2510 Atherosclerotic heart disease of native coronary artery without angina pectoris: Secondary | ICD-10-CM | POA: Diagnosis not present

## 2022-04-11 DIAGNOSIS — N179 Acute kidney failure, unspecified: Secondary | ICD-10-CM | POA: Diagnosis not present

## 2022-04-11 DIAGNOSIS — I5023 Acute on chronic systolic (congestive) heart failure: Secondary | ICD-10-CM | POA: Diagnosis not present

## 2022-04-11 LAB — BASIC METABOLIC PANEL
Anion gap: 10 (ref 5–15)
BUN: 32 mg/dL — ABNORMAL HIGH (ref 6–20)
CO2: 23 mmol/L (ref 22–32)
Calcium: 8.7 mg/dL — ABNORMAL LOW (ref 8.9–10.3)
Chloride: 108 mmol/L (ref 98–111)
Creatinine, Ser: 1.73 mg/dL — ABNORMAL HIGH (ref 0.61–1.24)
GFR, Estimated: 45 mL/min — ABNORMAL LOW (ref >60)
Glucose, Bld: 110 mg/dL — ABNORMAL HIGH (ref 70–99)
Potassium: 3.6 mmol/L (ref 3.5–5.1)
Sodium: 141 mmol/L (ref 135–145)

## 2022-04-11 MED ORDER — FUROSEMIDE 10 MG/ML IJ SOLN
80.0000 mg | Freq: Once | INTRAMUSCULAR | Status: AC
  Administered 2022-04-11: 80 mg via INTRAVENOUS
  Filled 2022-04-11: qty 8

## 2022-04-11 MED ORDER — POTASSIUM CHLORIDE CRYS ER 20 MEQ PO TBCR
40.0000 meq | EXTENDED_RELEASE_TABLET | Freq: Once | ORAL | Status: AC
Start: 2022-04-11 — End: 2022-04-11
  Administered 2022-04-11: 40 meq via ORAL
  Filled 2022-04-11: qty 2

## 2022-04-11 MED ORDER — SODIUM CHLORIDE 0.9% FLUSH
3.0000 mL | Freq: Two times a day (BID) | INTRAVENOUS | Status: DC
  Administered 2022-04-11 – 2022-04-16 (×7): 3 mL via INTRAVENOUS

## 2022-04-11 NOTE — Progress Notes (Signed)
Progress Note   Patient: Zachary Arias HCW:[id number] DOB: November 07, 1960 DOA: 04/08/2022     3 DOS: the patient was seen and examined on 04/11/2022   Brief hospital course: Zachary Arias was admitted to the hospital with the working diagnosis of decompensated heart failure.   Zachary Arias with past medical history of advanced systolic congestive heart failure (Echo 12/2021 EF 30-35%) on continuous dobutamine infusion, (end stage and inotropic dependent) coronary artery disease (cath 02/2020 Dist RCA 100% stenosed, prox RCA 50% stenosed, Mid Cx/Mid LAD/1st Mrg all 30-40%), chronic hypoxic respiratory failure (on 2lpm via [place] at home), chronic kidney disease stage IIIa who presents with dyspnea.  Recent hospitalization 04/23 to 02/04/22 for heart failure with low output cardiogenic shock, he was diuresed and discharged home with continuous infusion of dobutamine along with hospice.  Reported 3 to 4 days of worsening dyspnea and weakness, worse with exertion, refractive to a flow increase in his home supplemental 02. On his initial physical examination his blood pressure was 99.75, HR 85, RR 15 and 02 saturation 100%, lungs with rales bilateral with no wheezing, increase work of breathing, heart with S1 and S2 present with no gallops or murmurs, no abdominal distention and positive lower extremity edema.   Na 145, K 3,8 CL 109, bicarbonate 23, glucose 115 bun 22 cr 1,65 AST 94, ALT 226, total Bil 1,7  BNP 3,236  High sensitive troponin 28 and 33  Wbc 98 hgb 12,5 plt 269  Sars covid 19 negative   Chest radiograph with cardiomegaly, bilateral hilar vascular congestion, no infiltrates or effusions.   EKG 87 bpm, normal axis, qtc 577, sinus rhythm with left atrial enlargement, ST depression V5 and V6 with no significant T wave changes.   Patient had diuresis for volume overload with improvement in his symptoms.  Plan for TEE and cardiac catheterization, possible mitral valve intervention may help in  improving his heart function and wean off dobutamine.   Assessment and Plan: * Acute on chronic systolic (congestive) heart failure (HCC) 04/23 echocardiogram with LV systolic function 30 to 35%, global hypokinesis, preserved RV systolic function, severe enlargement of pulmonary artery with RVSP 64,0 mmHg. Severe LA enlargement. Severe mitral regurgitation, moderate TR, posterior pericardial effusion.   Troponin elevation due to heart failure decompensation.   Severe pulmonary hypertension, possible class 2 (acute on chronic core pulmonale).   Urine output is 2,550 ml  Systolic blood pressure is 97 to 104  mmHg.   Medical therapy with dobutamine, digoxin, dapagliflozin and spironolactone.  Continue telemetry monitoring.   Plan for right and left heart catheterization and TEE for further evaluation mitral valve, he may be candidate for intervention that can improve his heart function and wean off dobutamine.   Coronary artery disease involving native coronary artery of native heart without angina pectoris Continue with aspirin and statin.    Acute kidney injury superimposed on chronic kidney disease (HCC) CKD stage 3b   Patient with improvement in volume status, his renal function today has a serum cr of 1,73 with K at 3,6 and serum bicarbonate at 23.  Continue close follow up on electrolytes.  Diuresis with empagliflozin and spironolactone.  Holding on loop diuretic for now.    Acute on chronic respiratory failure with hypoxia [facility]) Patient with severe pulmonary hypertension. On admission signs of acute cardiogenic pulmonary edema.  Volume status has improved, but he continue to have dyspnea on exertion.   His oxygen saturation today is 100% on 3 L/min per [place].  Frequent PVCs Continue with amiodarone.  Telemetry monitoring.   Goals of care, counseling/discussion Patient has been in hospice but now considering possible advance heart failure interventions.  Code status is  DNR         Subjective: Patient with no chest pain, positive dyspnea on exertion but not at rest   Physical Exam: Vitals:   04/11/22 0031 04/11/22 0410 04/11/22 0832 04/11/22 0905  BP: 108/80 104/70 97/66   Pulse: 88 88 76 84  Resp: 18 18 16    Temp: 99.4 F (37.4 C) 98 F (36.7 C) 98 F (36.7 C)   TempSrc: Oral Oral Oral   SpO2: 100% 100% 99%   Weight:  64.7 kg    Height:       Neurology awake and alert ENT with mild pallor Cardiovascular with S1 and S2 present (loud p2)  and regular with no gallops, positive systolic murmur at the apex.  Positive JVD Trace lower extremity edema Respiratory with no rales or wheezing Abdomen with no distention    Data Reviewed:    Family Communication: no family at the bedside   Disposition: Status is: Inpatient Remains inpatient appropriate because: heart failure, will need further invasive workup   Planned Discharge Destination: Home    Author: , MD 04/11/2022 12:09 PM  For on call review [email or web address].

## 2022-04-11 NOTE — Progress Notes (Signed)
Pharmacist Heart Failure Core Measure Documentation  Assessment: Zachary Arias has an EF documented as 30% by ECHO.  Rationale: Heart failure patients with left ventricular systolic dysfunction (LVSD) and an EF < 40% should be prescribed an angiotensin converting enzyme inhibitor (ACEI) or angiotensin receptor blocker (ARB) at discharge unless a contraindication is documented in the medical record.  This patient is not currently on an ACEI or ARB for HF.  This note is being placed in the record in order to provide documentation that a contraindication to the use of these agents is present for this encounter.  ACE Inhibitor or Angiotensin Receptor Blocker is contraindicated (specify all that apply)  []   ACEI allergy AND ARB allergy []   Angioedema []   Moderate or severe aortic stenosis []   Hyperkalemia [x]   Hypotension []   Renal artery stenosis []   Worsening renal function, preexisting renal disease or dysfunction   Pharm.D. CPP, BCPS Clinical Pharmacist [phone] 04/11/2022 8:21 AM

## 2022-04-11 NOTE — Progress Notes (Signed)
Advanced Heart Failure Rounding Note  PCP-Cardiologist: Dr. Haroldine Laws   Subjective:    Remains on DBA at 5. Admit co-ox ok   Denies CP or SOB.   SCr 1.65 -> 2.0 -> 1.7 Diuretics on hold   Echo 04/10/22 EF 25-30% Severe MR/TR. Moderate RV HK   Denies CP or SOB    Objective:   Weight Range: 64.7 kg Body mass index is 21.69 kg/m.   Vital Signs:   Temp:  [98 F (36.7 C)-99.4 F (37.4 C)] 98 F (36.7 C) (07/16 0410) Pulse Rate:  [85-89] 88 (07/16 0410) Resp:  [13-18] 18 (07/16 0410) BP: (93-108)/(66-80) 104/70 (07/16 0410) SpO2:  [100 %] 100 % (07/16 0410) Weight:  [64.7 kg] 64.7 kg (07/16 0410) Last BM Date : 04/09/22  Weight change: Filed Weights   04/09/22 1321 04/10/22 0504 04/11/22 0410  Weight: 65.2 kg 65.1 kg 64.7 kg    Intake/Output:   Intake/Output Summary (Last 24 hours) at 04/11/2022 0851 Last data filed at 04/11/2022 0847 Gross per 24 hour  Intake 180 ml  Output 2500 ml  Net -2320 ml       Physical Exam    General:  Well appearing. No resp difficulty HEENT: normal Neck: supple. no JVD. Carotids 2+ bilat; no bruits. No lymphadenopathy or thryomegaly appreciated. Cor: PMI nondisplaced. Regular rate & rhythm. + s3 2/6 MR/TR Lungs: clear Abdomen: soft, nontender, nondistended. No hepatosplenomegaly. No bruits or masses. Good bowel sounds. Extremities: no cyanosis, clubbing, rash, edema Neuro: alert & orientedx3, cranial nerves grossly intact. moves all 4 extremities w/o difficulty. Affect pleasant   Telemetry   NSR 80-90s Personally reviewed  Labs    CBC Recent Labs    04/08/22 2055 04/09/22 0403  WBC 9.6 9.8  NEUTROABS 7.2 7.0  HGB 12.2* 12.5*  HCT 38.2* 38.2*  MCV 85.8 86.6  PLT 282 539    Basic Metabolic Panel Recent Labs    04/09/22 0403 04/10/22 0340 04/11/22 0420  NA 145 143 141  K 3.8 4.3 3.6  CL 109 109 108  CO2 _0 GLUCOSE 115* 113* 110*  BUN 22* 30* 32*  CREATININE 1.65* 2.00* 1.73*  CALCIUM 8.7*  8.9 8.7*  MG 2.5*  --   --     Liver Function Tests Recent Labs    04/09/22 0403  AST 94*  ALT 226*  ALKPHOS 139*  BILITOT 1.7*  PROT 5.6*  ALBUMIN 3.3*    No results for input(s): "LIPASE", "AMYLASE" in the last 72 hours. Cardiac Enzymes No results for input(s): "CKTOTAL", "CKMB", "CKMBINDEX", "TROPONINI" in the last 72 hours.  BNP: BNP (last 3 results) Recent Labs    02/12/22 1432 03/09/22 1540 04/08/22 2055  BNP 562.1* 503.3* 3,236.0*     ProBNP (last 3 results) No results for input(s): "PROBNP" in the last 8760 hours.   D-Dimer No results for input(s): "DDIMER" in the last 72 hours. Hemoglobin A1C No results for input(s): "HGBA1C" in the last 72 hours. Fasting Lipid Panel No results for input(s): "CHOL", "HDL", "LDLCALC", "TRIG", "CHOLHDL", "LDLDIRECT" in the last 72 hours. Thyroid Function Tests No results for input(s): "TSH", "T4TOTAL", "T3FREE", "THYROIDAB" in the last 72 hours.  Invalid input(s): "FREET3"  Other results:   Imaging    ECHOCARDIOGRAM COMPLETE  Result Date: 04/10/2022    ECHOCARDIOGRAM REPORT   Patient Name:   Zachary Arias Date of Exam: 04/10/2022 Medical Rec #:  [id number]     Height:  68.0 in Accession #:    [id number]    Weight:       143.5 lb Date of Birth:  11-Nov-1960     BSA:          1.775 m Patient Age:    61 years      BP:           93/73 mmHg Patient Gender: M             HR:           86 bpm. Exam Location:  Inpatient Procedure: 2D Echo, Cardiac Doppler, 3D Echo and Intracardiac Opacification            Agent Indications:    CHF  History:        Patient has prior history of Echocardiogram examinations, most                 recent 01/18/2022. CHF, CAD; Risk Factors:Hypertension. On                 dobutamine drip during the time of the echo.  Sonographer:    Merrie Roof RDCS Referring Phys: [place]  1. Left ventricular ejection fraction, by estimation, is 30 to 35%. The left ventricle has moderate to  severely decreased function. The left ventricle demonstrates global hypokinesis. The left ventricular internal cavity size was moderately to severely dilated. There is mild left ventricular hypertrophy. Left ventricular diastolic parameters are consistent with Grade III diastolic dysfunction (restrictive). Elevated left atrial pressure.  2. Right ventricular systolic function is normal. The right ventricular size is mildly enlarged. There is severely elevated pulmonary artery systolic pressure.  3. Left atrial size was severely dilated.  4. Right atrial size was severely dilated.  5. Dilated ventricle leading to poor MV coaptation and severe functional MR.. The mitral valve is abnormal. Severe mitral valve regurgitation. No evidence of mitral stenosis.  6. Hepatic systolic flow reversal consistent with severe TR     . The tricuspid valve is abnormal. Tricuspid valve regurgitation is severe.  7. The aortic valve has an indeterminant number of cusps. Aortic valve regurgitation is not visualized. No aortic stenosis is present.  8. The inferior vena cava is dilated in size with <50% respiratory variability, suggesting right atrial pressure of 15 mmHg. FINDINGS  Left Ventricle: Left ventricular ejection fraction, by estimation, is 30 to 35%. The left ventricle has moderate to severely decreased function. The left ventricle demonstrates global hypokinesis. The left ventricular internal cavity size was moderately  to severely dilated. There is mild left ventricular hypertrophy. Left ventricular diastolic parameters are consistent with Grade III diastolic dysfunction (restrictive). Elevated left atrial pressure. Right Ventricle: The right ventricular size is mildly enlarged. Right vetricular wall thickness was not well visualized. Right ventricular systolic function is normal. There is severely elevated pulmonary artery systolic pressure. The tricuspid regurgitant velocity is 3.47 m/s, and with an assumed right atrial  pressure of 15 mmHg, the estimated right ventricular systolic pressure is 56.3 mmHg. Left Atrium: Left atrial size was severely dilated. Right Atrium: Right atrial size was severely dilated. Pericardium: There is no evidence of pericardial effusion. Mitral Valve: Dilated ventricle leading to poor MV coaptation and severe functional MR. The mitral valve is abnormal. There is mild thickening of the mitral valve leaflet(s). There is mild calcification of the mitral valve leaflet(s). Mild mitral annular  calcification. Severe mitral valve regurgitation. No evidence of mitral valve stenosis. Tricuspid Valve: Hepatic systolic flow  reversal consistent with severe TR. The tricuspid valve is abnormal. Tricuspid valve regurgitation is severe. No evidence of tricuspid stenosis. Aortic Valve: The aortic valve has an indeterminant number of cusps. Aortic valve regurgitation is not visualized. No aortic stenosis is present. Aortic valve mean gradient measures 2.4 mmHg. Aortic valve peak gradient measures 5.9 mmHg. Aortic valve area, by VTI measures 1.49 cm. Pulmonic Valve: The pulmonic valve was not well visualized. Pulmonic valve regurgitation is mild. No evidence of pulmonic stenosis. Aorta: The aortic root is normal in size and structure. Venous: The inferior vena cava is dilated in size with less than 50% respiratory variability, suggesting right atrial pressure of 15 mmHg. IAS/Shunts: The interatrial septum was not well visualized.  LEFT VENTRICLE PLAX 2D LVIDd:         6.80 cm LVIDs:         5.60 cm LV PW:         0.90 cm LV IVS:        0.80 cm LVOT diam:     2.00 cm      3D Volume EF: LV SV:         32           3D EF:        47 % LV SV Index:   18           LV EDV:       223 ml LVOT Area:     3.14 cm     LV ESV:       118 ml                             LV SV:        105 ml  LV Volumes (MOD) LV vol d, MOD A2C: 196.0 ml LV vol d, MOD A4C: 173.0 ml LV vol s, MOD A2C: 106.0 ml LV vol s, MOD A4C: 89.5 ml LV SV MOD A2C:      90.0 ml LV SV MOD A4C:     173.0 ml LV SV MOD BP:      94.5 ml RIGHT VENTRICLE             IVC RV Basal diam:  4.30 cm     IVC diam: 2.70 cm RV Mid diam:    3.00 cm RV S prime:     17.30 cm/s TAPSE (M-mode): 2.4 cm LEFT ATRIUM              Index         RIGHT ATRIUM           Index LA diam:        5.20 cm  2.93 cm/m    RA Area:     23.20 cm LA Vol (A2C):   271.0 ml 152.69 ml/m  RA Volume:   74.70 ml  42.09 ml/m LA Vol (A4C):   99.6 ml  56.12 ml/m LA Biplane Vol: 179.0 ml 100.86 ml/m  AORTIC VALVE AV Area (Vmax):    1.66 cm AV Area (Vmean):   1.81 cm AV Area (VTI):     1.49 cm AV Vmax:           121.27 cm/s AV Vmean:          69.145 cm/s AV VTI:            0.215 m AV Peak Grad:      5.9 mmHg AV  Mean Grad:      2.4 mmHg LVOT Vmax:         64.00 cm/s LVOT Vmean:        39.800 cm/s LVOT VTI:          0.102 m LVOT/AV VTI ratio: 0.47  AORTA Ao Root diam: 2.70 cm Ao Asc diam:  2.30 cm MR Peak grad:    76.7 mmHg    TRICUSPID VALVE MR Mean grad:    42.0 mmHg    TR Peak grad:   48.2 mmHg MR Vmax:         438.00 cm/s  TR Vmax:        347.00 cm/s MR Vmean:        300.0 cm/s MR PISA:         9.05 cm     SHUNTS MR PISA Eff ROA: 64 mm       Systemic VTI:  0.10 m MR PISA Radius:  1.20 cm      Systemic Diam: 2.00 cm Carlyle Dolly MD Electronically signed by Carlyle Dolly MD Signature Date/Time: 04/10/2022/5:29:36 PM    Final      Medications:     Scheduled Medications:  amiodarone  200 mg Oral BID   aspirin EC  81 mg Oral Daily   atorvastatin  40 mg Oral Daily   Chlorhexidine Gluconate Cloth  6 each Topical Daily   dapagliflozin propanediol  10 mg Oral Daily   digoxin  0.0625 mg Oral Daily   enoxaparin (LOVENOX) injection  40 mg Subcutaneous Q24H   spironolactone  12.5 mg Oral Daily    Infusions:  DOBUTamine 5 mcg/kg/min (04/11/22 0537)    PRN Medications: acetaminophen **OR** acetaminophen, LORazepam, ondansetron **OR** ondansetron (ZOFRAN) IV, polyethylene glycol    Patient Profile   61  y.o. 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 Cath with 1-v CAD with chronically occluded RCA. MRI in January 2011 with EF 31% with inferior scar. Recent admit 4/23 for a/c CHF w/ low output, requiring DBA. He is end-stage and inotrope dependent, on home DBA. Not a good candidate for advanced therapies with lack of support system, and non-citizen status and does not want this. Has been followed by home hospice.   Presented to ED w/ CC of progressive dyspnea and increased supp O2 requirements.   BNP 3,236. CXR w/ vascular congestion.    Assessment/Plan   1. Acute on Chronic Systolic Heart Failure - Cath with 1-v CAD with chronically occluded RCA. MRI in January 2011 with EF 31% with inferior scar.  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   - Echo (8/13): EF 45-50% .  - No f/u between to 2015-2021 - Echo (6/21): EF 20-25% severe central MR. Repeat cath 6/21 with stable CAD - Echo (4/22): EF 40-45% mild to mod MR RV mild HK  - Echo (4/23): EF 30-35%.   - Admit 4/23 with shock, started on DBA and lasix. Diuresed 20 lbs. Unable to wean DBA - He is end-stage and inotrope dependent. - On Home DBA 5 mcg/kg/min via single lumen PICC. Under hospice care - Admitted with a/c HF. Diuresed with IV lasix - Echo 04/10/22 EF 25-30% Severe MR/TR. Moderate RV HK  - Co-ox 76% on admit - Volume status ok. Off diuretics - Continue Farxiga 10 mg daily  - Continue digoxin 0.0625 mg daily.  - Continue spiro 12.5 mg daily. - Long discussion  with him again on 7/15 regarding options. He is interested in advanced therapies now. We discussed previous barriers: lack of insurance, non-compliance and lack of supportive caregiver. I met with SW. He now has insurance to cover. We discussed pros/cons transplant vs VAD. Echo with EF 25-30% on DBA with severe MR/TR.  - Will plan R/L cath tomorrow.  Followed by TEE Tuessday  Will d/w Structural team. I wonder if mTEER may bbe enough to stabilize and get off DBA   2. AKI on CKD Stage IIIa likely due to ATN/overdiuresis - Creatinine baseline 1.6 - 1.82 on admit>>1.65-> 2.00 -> 1.7 - holding diuretics - follow BMP    3. PVCs - Continue amio 200 mg bid.  4. CAD  - Cath with 1-v CAD with chronically occluded RCA - No s/s angina - Continue ASA and atorvastatin.   5. Transaminitis - suspect due to passive congestion vs low output (co-ox ok)  6. Severe MR/TR - ? mTEER candidate to forestall need for advanced therapies  - Cath and TEE as above  Length of Stay: [place], [place]  04/11/2022, 8:51 AM  Advanced Heart Failure Team Pager [phone] (M-F; 7a - 5p)  Please contact [facility] for night-coverage after hours (5p -7a ) and weekends on [email or web address]

## 2022-04-12 ENCOUNTER — Encounter (HOSPITAL_COMMUNITY)
Admission: EM | Disposition: A | Discharge status: Home / Self Care | Payer: Self-pay | Source: Home / Self Care | Attending: Internal Medicine

## 2022-04-12 ENCOUNTER — Encounter (HOSPITAL_COMMUNITY): Payer: Self-pay | Admitting: Internal Medicine

## 2022-04-12 DIAGNOSIS — N179 Acute kidney failure, unspecified: Secondary | ICD-10-CM | POA: Diagnosis not present

## 2022-04-12 DIAGNOSIS — I5023 Acute on chronic systolic (congestive) heart failure: Secondary | ICD-10-CM | POA: Diagnosis not present

## 2022-04-12 DIAGNOSIS — J9621 Acute and chronic respiratory failure with hypoxia: Secondary | ICD-10-CM | POA: Diagnosis not present

## 2022-04-12 DIAGNOSIS — I2510 Atherosclerotic heart disease of native coronary artery without angina pectoris: Secondary | ICD-10-CM | POA: Diagnosis not present

## 2022-04-12 HISTORY — PX: RIGHT/LEFT HEART CATH AND CORONARY ANGIOGRAPHY: CATH118266

## 2022-04-12 LAB — POCT I-STAT 7, (LYTES, BLD GAS, ICA,H+H)
Acid-Base Excess: 1 mmol/L (ref 0.0–2.0)
Bicarbonate: 25 mmol/L (ref 20.0–28.0)
Calcium, Ion: 1.09 mmol/L — ABNORMAL LOW (ref 1.15–1.40)
HCT: 35 % — ABNORMAL LOW (ref 39.0–52.0)
Hemoglobin: 11.9 g/dL — ABNORMAL LOW (ref 13.0–17.0)
O2 Saturation: 98 %
Potassium: 3.7 mmol/L (ref 3.5–5.1)
Sodium: 150 mmol/L — ABNORMAL HIGH (ref 135–145)
TCO2: 26 mmol/L (ref 22–32)
pCO2 arterial: 35.5 mmHg (ref 32–48)
pH, Arterial: 7.456 — ABNORMAL HIGH (ref 7.35–7.45)
pO2, Arterial: 94 mmHg (ref 83–108)

## 2022-04-12 LAB — POCT I-STAT EG7
Acid-Base Excess: 2 mmol/L (ref 0.0–2.0)
Acid-Base Excess: 3 mmol/L — ABNORMAL HIGH (ref 0.0–2.0)
Bicarbonate: 26.1 mmol/L (ref 20.0–28.0)
Bicarbonate: 27.1 mmol/L (ref 20.0–28.0)
Calcium, Ion: 1.09 mmol/L — ABNORMAL LOW (ref 1.15–1.40)
Calcium, Ion: 1.17 mmol/L (ref 1.15–1.40)
HCT: 35 % — ABNORMAL LOW (ref 39.0–52.0)
HCT: 37 % — ABNORMAL LOW (ref 39.0–52.0)
Hemoglobin: 11.9 g/dL — ABNORMAL LOW (ref 13.0–17.0)
Hemoglobin: 12.6 g/dL — ABNORMAL LOW (ref 13.0–17.0)
O2 Saturation: 63 %
O2 Saturation: 64 %
Potassium: 3.7 mmol/L (ref 3.5–5.1)
Potassium: 4 mmol/L (ref 3.5–5.1)
Sodium: 149 mmol/L — ABNORMAL HIGH (ref 135–145)
Sodium: 151 mmol/L — ABNORMAL HIGH (ref 135–145)
TCO2: 27 mmol/L (ref 22–32)
TCO2: 28 mmol/L (ref 22–32)
pCO2, Ven: 39.1 mmHg — ABNORMAL LOW (ref 44–60)
pCO2, Ven: 39.7 mmHg — ABNORMAL LOW (ref 44–60)
pH, Ven: 7.433 — ABNORMAL HIGH (ref 7.25–7.43)
pH, Ven: 7.443 — ABNORMAL HIGH (ref 7.25–7.43)
pO2, Ven: 32 mmHg (ref 32–45)
pO2, Ven: 32 mmHg (ref 32–45)

## 2022-04-12 LAB — CBC
HCT: 36.6 % — ABNORMAL LOW (ref 39.0–52.0)
Hemoglobin: 12 g/dL — ABNORMAL LOW (ref 13.0–17.0)
MCH: 28.1 pg (ref 26.0–34.0)
MCHC: 32.8 g/dL (ref 30.0–36.0)
MCV: 85.7 fL (ref 80.0–100.0)
Platelets: 196 10*3/uL (ref 150–400)
RBC: 4.27 MIL/uL (ref 4.22–5.81)
RDW: 17.5 % — ABNORMAL HIGH (ref 11.5–15.5)
WBC: 12.7 10*3/uL — ABNORMAL HIGH (ref 4.0–10.5)
nRBC: 0.9 % — ABNORMAL HIGH (ref 0.0–0.2)

## 2022-04-12 LAB — BASIC METABOLIC PANEL
Anion gap: 8 (ref 5–15)
BUN: 33 mg/dL — ABNORMAL HIGH (ref 6–20)
CO2: 24 mmol/L (ref 22–32)
Calcium: 8.7 mg/dL — ABNORMAL LOW (ref 8.9–10.3)
Chloride: 112 mmol/L — ABNORMAL HIGH (ref 98–111)
Creatinine, Ser: 1.49 mg/dL — ABNORMAL HIGH (ref 0.61–1.24)
GFR, Estimated: 54 mL/min — ABNORMAL LOW (ref >60)
Glucose, Bld: 130 mg/dL — ABNORMAL HIGH (ref 70–99)
Potassium: 4.2 mmol/L (ref 3.5–5.1)
Sodium: 144 mmol/L (ref 135–145)

## 2022-04-12 LAB — MAGNESIUM: Magnesium: 2.6 mg/dL — ABNORMAL HIGH (ref 1.7–2.4)

## 2022-04-12 SURGERY — RIGHT/LEFT HEART CATH AND CORONARY ANGIOGRAPHY
Anesthesia: LOCAL

## 2022-04-12 MED ORDER — FENTANYL CITRATE (PF) 100 MCG/2ML IJ SOLN
INTRAMUSCULAR | Status: DC | PRN
  Administered 2022-04-12: 25 ug via INTRAVENOUS

## 2022-04-12 MED ORDER — HEPARIN (PORCINE) IN NACL 1000-0.9 UT/500ML-% IV SOLN
INTRAVENOUS | Status: AC
  Filled 2022-04-12: qty 1000

## 2022-04-12 MED ORDER — HYDRALAZINE HCL 20 MG/ML IJ SOLN: 10.0000 mg | INTRAMUSCULAR | Status: AC | PRN

## 2022-04-12 MED ORDER — MIDAZOLAM HCL 2 MG/2ML IJ SOLN
INTRAMUSCULAR | Status: AC
  Filled 2022-04-12: qty 2

## 2022-04-12 MED ORDER — ONDANSETRON HCL 4 MG/2ML IJ SOLN: 4.0000 mg | Freq: Four times a day (QID) | INTRAMUSCULAR | Status: DC | PRN

## 2022-04-12 MED ORDER — HEPARIN SODIUM (PORCINE) 1000 UNIT/ML IJ SOLN
INTRAMUSCULAR | Status: AC
  Filled 2022-04-12: qty 10

## 2022-04-12 MED ORDER — ASPIRIN 81 MG PO CHEW: 81.0000 mg | CHEWABLE_TABLET | ORAL | Status: DC

## 2022-04-12 MED ORDER — VERAPAMIL HCL 2.5 MG/ML IV SOLN
INTRAVENOUS | Status: AC
  Filled 2022-04-12: qty 2

## 2022-04-12 MED ORDER — SODIUM CHLORIDE 0.9 % IV SOLN: INTRAVENOUS | Status: DC

## 2022-04-12 MED ORDER — ASPIRIN 81 MG PO CHEW
81.0000 mg | CHEWABLE_TABLET | ORAL | Status: AC
  Administered 2022-04-12: 81 mg via ORAL
  Filled 2022-04-12: qty 1

## 2022-04-12 MED ORDER — MIDAZOLAM HCL 2 MG/2ML IJ SOLN
INTRAMUSCULAR | Status: DC | PRN
  Administered 2022-04-12: 1 mg via INTRAVENOUS

## 2022-04-12 MED ORDER — VERAPAMIL HCL 2.5 MG/ML IV SOLN
INTRAVENOUS | Status: DC | PRN
  Administered 2022-04-12: 10 mL via INTRA_ARTERIAL

## 2022-04-12 MED ORDER — LIDOCAINE HCL (PF) 1 % IJ SOLN
INTRAMUSCULAR | Status: DC | PRN
  Administered 2022-04-12: 5 mL

## 2022-04-12 MED ORDER — LABETALOL HCL 5 MG/ML IV SOLN: 10.0000 mg | INTRAVENOUS | Status: DC | PRN

## 2022-04-12 MED ORDER — SODIUM CHLORIDE 0.9% FLUSH: 3.0000 mL | INTRAVENOUS | Status: DC | PRN

## 2022-04-12 MED ORDER — SODIUM CHLORIDE 0.9 % IV SOLN: 250.0000 mL | INTRAVENOUS | Status: DC | PRN

## 2022-04-12 MED ORDER — ACETAMINOPHEN 325 MG PO TABS: 650.0000 mg | ORAL_TABLET | ORAL | Status: DC | PRN

## 2022-04-12 MED ORDER — SODIUM CHLORIDE 0.9% FLUSH
3.0000 mL | Freq: Two times a day (BID) | INTRAVENOUS | Status: DC
  Administered 2022-04-12 – 2022-04-17 (×6): 3 mL via INTRAVENOUS

## 2022-04-12 MED ORDER — LIDOCAINE HCL (PF) 1 % IJ SOLN
INTRAMUSCULAR | Status: AC
  Filled 2022-04-12: qty 30

## 2022-04-12 MED ORDER — FENTANYL CITRATE (PF) 100 MCG/2ML IJ SOLN
INTRAMUSCULAR | Status: AC
  Filled 2022-04-12: qty 2

## 2022-04-12 MED ORDER — ENOXAPARIN SODIUM 40 MG/0.4ML IJ SOSY: 40.0000 mg | PREFILLED_SYRINGE | INTRAMUSCULAR | Status: DC

## 2022-04-12 MED ORDER — HEPARIN SODIUM (PORCINE) 1000 UNIT/ML IJ SOLN
INTRAMUSCULAR | Status: DC | PRN
  Administered 2022-04-12: 3000 [IU] via INTRAVENOUS

## 2022-04-12 MED ORDER — HEPARIN (PORCINE) IN NACL 1000-0.9 UT/500ML-% IV SOLN
INTRAVENOUS | Status: DC | PRN
  Administered 2022-04-12 (×2): 500 mL

## 2022-04-12 MED ORDER — IOHEXOL 350 MG/ML SOLN
INTRAVENOUS | Status: DC | PRN
  Administered 2022-04-12: 30 mL

## 2022-04-12 SURGICAL SUPPLY — 9 items
CATH 5FR JL3.5 JR4 ANG PIG MP (CATHETERS) ×1 IMPLANT
CATH BALLN WEDGE 5F 110CM (CATHETERS) ×1 IMPLANT
GLIDESHEATH SLEND SS 6F .021 (SHEATH) ×1 IMPLANT
GUIDEWIRE INQWIRE 1.5J.035X260 (WIRE) IMPLANT
INQWIRE 1.5J .035X260CM (WIRE) ×2
PACK CARDIAC CATHETERIZATION (CUSTOM PROCEDURE TRAY) ×2 IMPLANT
SHEATH GLIDE SLENDER 4/5FR (SHEATH) ×1 IMPLANT
TRANSDUCER W/STOPCOCK (MISCELLANEOUS) ×2 IMPLANT
WIRE EMERALD 3MM-J .025X260CM (WIRE) ×1 IMPLANT

## 2022-04-12 NOTE — Plan of Care (Signed)
  Problem: Education: Goal: Knowledge of General Education information will improve Description: Including pain rating scale, medication(s)/side effects and non-pharmacologic comfort measures Outcome: Progressing   Problem: Cardiac: Goal: Ability to achieve and maintain adequate cardiopulmonary perfusion will improve Outcome: Progressing   Problem: Cardiovascular: Goal: Vascular access site(s) Level 0-1 will be maintained Outcome: Progressing

## 2022-04-12 NOTE — Progress Notes (Signed)
Progress Note   Patient: Zachary Arias GMW:[id number] DOB: 1961-09-22 DOA: 04/08/2022     4 DOS: the patient was seen and examined on 04/12/2022   Brief hospital course: Zachary Arias was admitted to the hospital with the working diagnosis of decompensated heart failure.   61 year old male with past medical history of advanced systolic congestive heart failure (Echo 12/2021 EF 30-35%) on continuous dobutamine infusion, (end stage and inotropic dependent) coronary artery disease (cath 02/2020 Dist RCA 100% stenosed, prox RCA 50% stenosed, Mid Cx/Mid LAD/1st Mrg all 30-40%), chronic hypoxic respiratory failure (on 2lpm via [place] at home), chronic kidney disease stage IIIa who presents with dyspnea.  Recent hospitalization 04/23 to 02/04/22 for heart failure with low output cardiogenic shock, he was diuresed and discharged home with continuous infusion of dobutamine along with hospice.  Reported 3 to 4 days of worsening dyspnea and weakness, worse with exertion, refractive to a flow increase in his home supplemental 02. On his initial physical examination his blood pressure was 99.75, HR 85, RR 15 and 02 saturation 100%, lungs with rales bilateral with no wheezing, increase work of breathing, heart with S1 and S2 present with no gallops or murmurs, no abdominal distention and positive lower extremity edema.   Na 145, K 3,8 CL 109, bicarbonate 23, glucose 115 bun 22 cr 1,65 AST 94, ALT 226, total Bil 1,7  BNP 3,236  High sensitive troponin 28 and 33  Wbc 98 hgb 12,5 plt 269  Sars covid 19 negative   Chest radiograph with cardiomegaly, bilateral hilar vascular congestion, no infiltrates or effusions.   EKG 87 bpm, normal axis, qtc 577, sinus rhythm with left atrial enlargement, ST depression V5 and V6 with no significant T wave changes.   Patient had diuresis for volume overload with improvement in his symptoms.  07/17 Cardiac catheterization with stable coronary artery disease, moderate reduced cardiac  output and prominent v waved in PCWP suggesting significant mitral regurgitation.    Plan for TEE, further evaluate mitral valve.   Assessment and Plan: * Acute on chronic systolic (congestive) heart failure (HCC) 04/23 echocardiogram with LV systolic function 30 to 35%, global hypokinesis, preserved RV systolic function, severe enlargement of pulmonary artery with RVSP 64,0 mmHg. Severe LA enlargement. Severe mitral regurgitation, moderate TR, posterior pericardial effusion.   Troponin elevation due to heart failure decompensation.   Severe pulmonary hypertension, possible class 2 (acute on chronic core pulmonale).   07/17 cardiac catheterization with PA mean 39, PCWP 23, cardiac output 4,1 and index 2,3 per [place].   Urine output is 2,376 ml  Systolic blood pressure is 123 to 139 mmHg.   Medical therapy with dobutamine, digoxin, dapagliflozin and spironolactone.  PRN furosemide.  Continue telemetry monitoring.  Plan for further work up with TEE>     Coronary artery disease involving native coronary artery of native heart without angina pectoris Continue with aspirin and statin.    Acute kidney injury superimposed on chronic kidney disease (HCC) CKD stage 3b   Renal function with serum cr at 1,49 with K at 4,2 and serum bicarbonate at 24.  Plan to continue diuresis with empagliflozin and spironolactone.  PRN furosemide.  Follow up renal function in am.    Acute on chronic respiratory failure with hypoxia [facility]) Patient with severe pulmonary hypertension. On admission signs of acute cardiogenic pulmonary edema.  Volume status has improved, but he continue to have dyspnea on exertion.   His oxygen saturation today is 100% on 3 L/min per .  Frequent PVCs Continue with amiodarone.  Telemetry monitoring.   Goals of care, counseling/discussion Patient has been in hospice but now considering possible advance heart failure interventions.  Code status is DNR          Subjective: Patient with improvement in dyspnea, no chest pain, tolerating po well.   Physical Exam: Vitals:   04/12/22 1349 04/12/22 1354 04/12/22 1359 04/12/22 1404  BP: 126/69 127/66 127/69 134/70  Pulse: 82 82 82 83  Resp: 17 14 14 14   Temp:      TempSrc:      SpO2: 100% 93% 95% 100%  Weight:      Height:       Neurology awake and alert ENT with no pallor Cardiovascular with S1 and S2 present and rhythmic, positive systolic murmur 3/6 at the apex  Moderate JVD Trace lower extremity edema Respiratory with no rales or wheezing Abdomen not distended  Data Reviewed:    Family Communication: no family at the bedside   Disposition: Status is: Inpatient Remains inpatient appropriate because: heart failure   Planned Discharge Destination: Home  Author: , MD 04/12/2022 4:20 PM  For on call review [email or web address]/[email or web address].

## 2022-04-12 NOTE — Progress Notes (Signed)
Will need TEE to further assess MV/TV  Currently no spots. Will make NPO after MN just in case spot opens up.   Favor Hackler NP-C  2:17 PM

## 2022-04-12 NOTE — Progress Notes (Addendum)
Advanced Heart Failure Rounding Note  PCP-Cardiologist: Dr. Haroldine Laws   Subjective:    Remains on DBA at 5. Admit co-ox ok . No repeat CO-OX. Ordered  Echo 04/10/22 EF 25-30% Severe MR/TR. Moderate RV HK   Last night had acute dyspnea and given 80 mg IV lasix x1 Weight down 3 pounds.    SCr 1.65 -> 2.0 -> 1.7->1.5 . Yesterday  Diuretics on hold   Echo 04/10/22 EF 25-30% Severe MR/TR. Moderate RV HK   Denies SOB.   Objective:   Weight Range: 63.4 kg Body mass index is 21.26 kg/m.   Vital Signs:   Temp:  [97.5 F (36.4 C)-98.1 F (36.7 C)] 98 F (36.7 C) (07/17 0723) Pulse Rate:  [76-90] 77 (07/17 0502) Resp:  [12-17] 17 (07/17 0502) BP: (92-109)/(65-77) 97/76 (07/17 0502) SpO2:  [96 %-100 %] 96 % (07/17 0502) Weight:  [63.4 kg] 63.4 kg (07/17 0502) Last BM Date : 04/09/22  Weight change: Filed Weights   04/10/22 0504 04/11/22 0410 04/12/22 0502  Weight: 65.1 kg 64.7 kg 63.4 kg    Intake/Output:   Intake/Output Summary (Last 24 hours) at 04/12/2022 0820 Last data filed at 04/12/2022 0600 Gross per 24 hour  Intake 524.62 ml  Output 2376 ml  Net -1851.38 ml      Physical Exam  General:  Well appearing. No resp difficulty HEENT: normal Neck: supple. no JVD. Carotids 2+ bilat; no bruits. No lymphadenopathy or thryomegaly appreciated. Cor: PMI nondisplaced. Regular rate & rhythm. No rubs, gallops. 2/6 MR/TR Lungs: clear Abdomen: soft, nontender, nondistended. No hepatosplenomegaly. No bruits or masses. Good bowel sounds. Extremities: no cyanosis, clubbing, rash, edema. RUE PICC  Neuro: alert & orientedx3, cranial nerves grossly intact. moves all 4 extremities w/o difficulty. Affect pleasant   Telemetry   SR 80-90s personally checked.   Labs    CBC No results for input(s): "WBC", "NEUTROABS", "HGB", "HCT", "MCV", "PLT" in the last 72 hours. Basic Metabolic Panel Recent Labs    04/11/22 0420 04/12/22 0450  NA 141 144  K 3.6 4.2  CL 108 112*   CO2 23 24  GLUCOSE 110* 130*  BUN 32* 33*  CREATININE 1.73* 1.49*  CALCIUM 8.7* 8.7*  MG  --  2.6*   Liver Function Tests No results for input(s): "AST", "ALT", "ALKPHOS", "BILITOT", "PROT", "ALBUMIN" in the last 72 hours. No results for input(s): "LIPASE", "AMYLASE" in the last 72 hours. Cardiac Enzymes No results for input(s): "CKTOTAL", "CKMB", "CKMBINDEX", "TROPONINI" in the last 72 hours.  BNP: BNP (last 3 results) Recent Labs    02/12/22 1432 03/09/22 1540 04/08/22 2055  BNP 562.1* 503.3* 3,236.0*    ProBNP (last 3 results) No results for input(s): "PROBNP" in the last 8760 hours.   D-Dimer No results for input(s): "DDIMER" in the last 72 hours. Hemoglobin A1C No results for input(s): "HGBA1C" in the last 72 hours. Fasting Lipid Panel No results for input(s): "CHOL", "HDL", "LDLCALC", "TRIG", "CHOLHDL", "LDLDIRECT" in the last 72 hours. Thyroid Function Tests No results for input(s): "TSH", "T4TOTAL", "T3FREE", "THYROIDAB" in the last 72 hours.  Invalid input(s): "FREET3"  Other results:   Imaging    No results found.   Medications:     Scheduled Medications:  amiodarone  200 mg Oral BID   aspirin EC  81 mg Oral Daily   atorvastatin  40 mg Oral Daily   Chlorhexidine Gluconate Cloth  6 each Topical Daily   dapagliflozin propanediol  10 mg Oral Daily  digoxin  0.0625 mg Oral Daily   enoxaparin (LOVENOX) injection  40 mg Subcutaneous Q24H   sodium chloride flush  3 mL Intravenous Q12H   spironolactone  12.5 mg Oral Daily    Infusions:  sodium chloride     sodium chloride 10 mL/hr at 04/12/22 0552   DOBUTamine 5 mcg/kg/min (04/11/22 0537)    PRN Medications: sodium chloride, acetaminophen **OR** acetaminophen, LORazepam, ondansetron **OR** ondansetron (ZOFRAN) IV, polyethylene glycol, sodium chloride flush    Patient Profile   61 y.o. 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 Cath with 1-v CAD with chronically occluded RCA. MRI in January 2011 with EF 31% with inferior scar. Recent admit 4/23 for a/c CHF w/ low output, requiring DBA. He is end-stage and inotrope dependent, on home DBA. Not a good candidate for advanced therapies with lack of support system, and non-citizen status and does not want this. Has been followed by home hospice.   Presented to ED w/ CC of progressive dyspnea and increased supp O2 requirements.   BNP 3,236. CXR w/ vascular congestion.    Assessment/Plan   1. Acute on Chronic Systolic Heart Failure - Cath with 1-v CAD with chronically occluded RCA. MRI in January 2011 with EF 31% with inferior scar.  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   - Echo (8/13): EF 45-50% .  - No f/u between to 2015-2021 - Echo (6/21): EF 20-25% severe central MR. Repeat cath 6/21 with stable CAD - Echo (4/22): EF 40-45% mild to mod MR RV mild HK  - Echo (4/23): EF 30-35%.   - Admit 4/23 with shock, started on DBA and lasix. Diuresed 20 lbs. Unable to wean DBA - He is end-stage and inotrope dependent. - On Home DBA 5 mcg/kg/min via single lumen PICC. Under hospice care - Admitted with a/c HF. Diuresed with IV lasix - Echo 04/10/22 EF 25-30% Severe MR/TR. Moderate RV HK  - Volume status appears stable. Hold diuretics and adjust post cath.  - Continue Farxiga 10 mg daily  - Continue digoxin 0.0625 mg daily.  - Continue spiro 12.5 mg daily. - renal function stable.  - Long discussion with him again on 7/15 regarding options. He is interested in advanced therapies now. We discussed previous barriers: lack of insurance, non-compliance and lack of supportive caregiver. I met with SW. He now has insurance to cover. We discussed pros/cons transplant vs VAD. Echo with EF 25-30% on DBA with severe MR/TR.  - Will plan R/L cath today  Followed by TEE Tuessday  Will d/w Structural team. Wonder if mTEER may bbe  enough to stabilize and get off DBA   2. AKI on CKD Stage IIIa likely due to ATN/overdiuresis - Creatinine baseline 1.6 - 1.82 on admit>>1.65-> 2.00 -> 1.7>1.5 - holding diuretics - follow BMP    3. PVCs - Continue amio 200 mg bid. - K and Mag stable.   4. CAD  - Cath with 1-v CAD with chronically occluded RCA - No s/s angina - Continue ASA and atorvastatin.   5. Transaminitis - suspect due to passive congestion vs low output (co-ox ok)  6. Severe MR/TR - ? mTEER candidate to forestall need for advanced therapies  - Cath and TEE as above. Dr Haroldine Laws discussing with structural heart team.     Length of Stay: 4  Amy Clegg, NP  04/12/2022, 8:20 AM  Advanced Heart Failure Team Pager  [phone] (M-F; 7a - 5p)  Please contact [facility] for night-coverage after hours (5p -7a ) and weekends on [email or web address]  Patient seen and examined with the above-signed Advanced Practice Provider and/or Housestaff. I personally reviewed laboratory data, imaging studies and relevant notes. I independently examined the patient and formulated the important aspects of the plan. I have edited the note to reflect any of my changes or salient points. I have personally discussed the plan with the patient and/or family.  Remains on DBA. Had acute orthopnea/PND last night. Given lasix 80 IV. Feels better this am. Scr improved  General:  Sitting up . No resp difficulty HEENT: normal Neck: supple. no JVD. Carotids 2+ bilat; no bruits. No lymphadenopathy or thryomegaly appreciated. Cor: PMI nondisplaced. Regular tachy + s3 Lungs: clear Abdomen: soft, nontender, nondistended. No hepatosplenomegaly. No bruits or masses. Good bowel sounds. Extremities: no cyanosis, clubbing, rash, edema Neuro: alert & orientedx3, cranial nerves grossly intact. moves all 4 extremities w/o difficulty. Affect pleasant  Will plan cath today and TEE tomorrow to determine if we can temporize with mTEER or if he needs advanced  therapies.   Glori Bickers, MD  12:46 PM

## 2022-04-12 NOTE — Progress Notes (Signed)
Received pt from the cath lab, pt A+OX4, no c/o pain. No TR Band documentation was noted as this nurse began to let air out of the band. This nurse documented the band when I received him back to the room and then again first time air was removed. Was told in report TR Band was placed at 14:05 and the band had 55ml.

## 2022-04-12 NOTE — Interval H&P Note (Signed)
History and Physical Interval Note:  04/12/2022 12:47 PM  Zachary Arias  has presented today for surgery, with the diagnosis of HF.  The various methods of treatment have been discussed with the patient and family. After consideration of risks, benefits and other options for treatment, the patient has consented to  Procedure(s): RIGHT/LEFT HEART CATH AND CORONARY ANGIOGRAPHY (N/A) possible coronary angioplasty as a surgical intervention.  The patient's history has been reviewed, patient examined, no change in status, stable for surgery.  I have reviewed the patient's chart and labs.  Questions were answered to the patient's satisfaction.     Cayli Escajeda

## 2022-04-12 NOTE — Progress Notes (Signed)
Mobility Specialist: Progress Note   04/12/22 1107  Mobility  Activity Ambulated independently in hallway  Level of Assistance Independent  Assistive Device None  Distance Ambulated (ft) 430 ft  Activity Response Tolerated well  $Mobility charge 1 Mobility   Pre-Mobility on 2 L/min [place]: 85 HR, 112/77 (89) BP, 100% SpO2 Post-Mobility on RA: 89 HR, 103/73 (83) BP, 97% SpO2  Received pt in bed having no complaints and agreeable to mobility. Pt was asymptomatic throughout ambulation and returned to room w/o fault. Able to wean pt to RA. Left sitting EOB w/ call bell in reach and all needs met.   [facility] [name] Mobility Specialist Mobility Specialist [address]: [phone]

## 2022-04-12 NOTE — H&P (View-Only) (Signed)
Advanced Heart Failure Rounding Note  PCP-Cardiologist: Dr. Haroldine Laws   Subjective:    Remains on DBA at 5. Admit co-ox ok . No repeat CO-OX. Ordered  Echo 04/10/22 EF 25-30% Severe MR/TR. Moderate RV HK   Last night had acute dyspnea and given 80 mg IV lasix x1 Weight down 3 pounds.    SCr 1.65 -> 2.0 -> 1.7->1.5 . Yesterday  Diuretics on hold   Echo 04/10/22 EF 25-30% Severe MR/TR. Moderate RV HK   Denies SOB.   Objective:   Weight Range: 63.4 kg Body mass index is 21.26 kg/m.   Vital Signs:   Temp:  [97.5 F (36.4 C)-98.1 F (36.7 C)] 98 F (36.7 C) (07/17 0723) Pulse Rate:  [76-90] 77 (07/17 0502) Resp:  [12-17] 17 (07/17 0502) BP: (92-109)/(65-77) 97/76 (07/17 0502) SpO2:  [96 %-100 %] 96 % (07/17 0502) Weight:  [63.4 kg] 63.4 kg (07/17 0502) Last BM Date : 04/09/22  Weight change: Filed Weights   04/10/22 0504 04/11/22 0410 04/12/22 0502  Weight: 65.1 kg 64.7 kg 63.4 kg    Intake/Output:   Intake/Output Summary (Last 24 hours) at 04/12/2022 0820 Last data filed at 04/12/2022 0600 Gross per 24 hour  Intake 524.62 ml  Output 2376 ml  Net -1851.38 ml      Physical Exam  General:  Well appearing. No resp difficulty HEENT: normal Neck: supple. no JVD. Carotids 2+ bilat; no bruits. No lymphadenopathy or thryomegaly appreciated. Cor: PMI nondisplaced. Regular rate & rhythm. No rubs, gallops. 2/6 MR/TR Lungs: clear Abdomen: soft, nontender, nondistended. No hepatosplenomegaly. No bruits or masses. Good bowel sounds. Extremities: no cyanosis, clubbing, rash, edema. RUE PICC  Neuro: alert & orientedx3, cranial nerves grossly intact. moves all 4 extremities w/o difficulty. Affect pleasant   Telemetry   SR 80-90s personally checked.   Labs    CBC No results for input(s): "WBC", "NEUTROABS", "HGB", "HCT", "MCV", "PLT" in the last 72 hours. Basic Metabolic Panel Recent Labs    04/11/22 0420 04/12/22 0450  NA 141 144  K 3.6 4.2  CL 108 112*   CO2 23 24  GLUCOSE 110* 130*  BUN 32* 33*  CREATININE 1.73* 1.49*  CALCIUM 8.7* 8.7*  MG  --  2.6*   Liver Function Tests No results for input(s): "AST", "ALT", "ALKPHOS", "BILITOT", "PROT", "ALBUMIN" in the last 72 hours. No results for input(s): "LIPASE", "AMYLASE" in the last 72 hours. Cardiac Enzymes No results for input(s): "CKTOTAL", "CKMB", "CKMBINDEX", "TROPONINI" in the last 72 hours.  BNP: BNP (last 3 results) Recent Labs    02/12/22 1432 03/09/22 1540 04/08/22 2055  BNP 562.1* 503.3* 3,236.0*    ProBNP (last 3 results) No results for input(s): "PROBNP" in the last 8760 hours.   D-Dimer No results for input(s): "DDIMER" in the last 72 hours. Hemoglobin A1C No results for input(s): "HGBA1C" in the last 72 hours. Fasting Lipid Panel No results for input(s): "CHOL", "HDL", "LDLCALC", "TRIG", "CHOLHDL", "LDLDIRECT" in the last 72 hours. Thyroid Function Tests No results for input(s): "TSH", "T4TOTAL", "T3FREE", "THYROIDAB" in the last 72 hours.  Invalid input(s): "FREET3"  Other results:   Imaging    No results found.   Medications:     Scheduled Medications:  amiodarone  200 mg Oral BID   aspirin EC  81 mg Oral Daily   atorvastatin  40 mg Oral Daily   Chlorhexidine Gluconate Cloth  6 each Topical Daily   dapagliflozin propanediol  10 mg Oral Daily  digoxin  0.0625 mg Oral Daily   enoxaparin (LOVENOX) injection  40 mg Subcutaneous Q24H   sodium chloride flush  3 mL Intravenous Q12H   spironolactone  12.5 mg Oral Daily    Infusions:  sodium chloride     sodium chloride 10 mL/hr at 04/12/22 0552   DOBUTamine 5 mcg/kg/min (04/11/22 0537)    PRN Medications: sodium chloride, acetaminophen **OR** acetaminophen, LORazepam, ondansetron **OR** ondansetron (ZOFRAN) IV, polyethylene glycol, sodium chloride flush    Patient Profile   61 y.o. 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 Cath with 1-v CAD with chronically occluded RCA. MRI in January 2011 with EF 31% with inferior scar. Recent admit 4/23 for a/c CHF w/ low output, requiring DBA. He is end-stage and inotrope dependent, on home DBA. Not a good candidate for advanced therapies with lack of support system, and non-citizen status and does not want this. Has been followed by home hospice.   Presented to ED w/ CC of progressive dyspnea and increased supp O2 requirements.   BNP 3,236. CXR w/ vascular congestion.    Assessment/Plan   1. Acute on Chronic Systolic Heart Failure - Cath with 1-v CAD with chronically occluded RCA. MRI in January 2011 with EF 31% with inferior scar.  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   - Echo (8/13): EF 45-50% .  - No f/u between to 2015-2021 - Echo (6/21): EF 20-25% severe central MR. Repeat cath 6/21 with stable CAD - Echo (4/22): EF 40-45% mild to mod MR RV mild HK  - Echo (4/23): EF 30-35%.   - Admit 4/23 with shock, started on DBA and lasix. Diuresed 20 lbs. Unable to wean DBA - He is end-stage and inotrope dependent. - On Home DBA 5 mcg/kg/min via single lumen PICC. Under hospice care - Admitted with a/c HF. Diuresed with IV lasix - Echo 04/10/22 EF 25-30% Severe MR/TR. Moderate RV HK  - Volume status appears stable. Hold diuretics and adjust post cath.  - Continue Farxiga 10 mg daily  - Continue digoxin 0.0625 mg daily.  - Continue spiro 12.5 mg daily. - renal function stable.  - Long discussion with him again on 7/15 regarding options. He is interested in advanced therapies now. We discussed previous barriers: lack of insurance, non-compliance and lack of supportive caregiver. I met with SW. He now has insurance to cover. We discussed pros/cons transplant vs VAD. Echo with EF 25-30% on DBA with severe MR/TR.  - Will plan R/L cath today  Followed by TEE Tuessday  Will d/w Structural team. Wonder if mTEER may bbe  enough to stabilize and get off DBA   2. AKI on CKD Stage IIIa likely due to ATN/overdiuresis - Creatinine baseline 1.6 - 1.82 on admit>>1.65-> 2.00 -> 1.7>1.5 - holding diuretics - follow BMP    3. PVCs - Continue amio 200 mg bid. - K and Mag stable.   4. CAD  - Cath with 1-v CAD with chronically occluded RCA - No s/s angina - Continue ASA and atorvastatin.   5. Transaminitis - suspect due to passive congestion vs low output (co-ox ok)  6. Severe MR/TR - ? mTEER candidate to forestall need for advanced therapies  - Cath and TEE as above. Dr Haroldine Laws discussing with structural heart team.     Length of Stay: 4  Amy Clegg, NP  04/12/2022, 8:20 AM  Advanced Heart Failure Team Pager  [phone] (M-F; 7a - 5p)  Please contact [facility] for night-coverage after hours (5p -7a ) and weekends on [email or web address]  Patient seen and examined with the above-signed Advanced Practice Provider and/or Housestaff. I personally reviewed laboratory data, imaging studies and relevant notes. I independently examined the patient and formulated the important aspects of the plan. I have edited the note to reflect any of my changes or salient points. I have personally discussed the plan with the patient and/or family.  Remains on DBA. Had acute orthopnea/PND last night. Given lasix 80 IV. Feels better this am. Scr improved  General:  Sitting up . No resp difficulty HEENT: normal Neck: supple. no JVD. Carotids 2+ bilat; no bruits. No lymphadenopathy or thryomegaly appreciated. Cor: PMI nondisplaced. Regular tachy + s3 Lungs: clear Abdomen: soft, nontender, nondistended. No hepatosplenomegaly. No bruits or masses. Good bowel sounds. Extremities: no cyanosis, clubbing, rash, edema Neuro: alert & orientedx3, cranial nerves grossly intact. moves all 4 extremities w/o difficulty. Affect pleasant  Will plan cath today and TEE tomorrow to determine if we can temporize with mTEER or if he needs advanced  therapies.   Glori Bickers, MD  12:46 PM

## 2022-04-12 NOTE — TOC Initial Note (Signed)
Transition of Care [facility]) - Initial/Assessment Note    Patient Details  Name: Zachary Arias MRN: [id number] Date of Birth: April 29, 1961  [facility]) CM/SW Contact:    Elliot Cousin, RN Phone Number: [phone] 04/12/2022, 3:42 PM  Clinical Narrative:                 HF TOC CM spoke to pt at bedside. Pt's care was sent to [facility] for IV Dobutamine at home. CM notified [facility]. Pt lives in home alone. States his Medicaid provides transportation and he has all the number and information. Pt reports having a scale at home.   Expected Discharge Plan: Home w Hospice Care Barriers to Discharge: Continued Medical Work up   Patient Goals and CMS Choice   CMS Medicare.gov Compare Post Acute Care list provided to:: Patient Choice offered to / list presented to : Patient  Expected Discharge Plan and Services Expected Discharge Plan: Home w Hospice Care   Discharge Planning Services: CM Consult Post Acute Care Choice: Hospice Living arrangements for the past 2 months: Apartment                           HH Arranged: RN [facility] Agency: [facility] Date [facility] Agency Contacted: 04/12/22 Time HH Agency Contacted: [phone]    Prior Living Arrangements/Services Living arrangements for the past 2 months: Apartment Lives with:: Self Patient language and need for interpreter reviewed:: Yes Do you feel safe going back to the place where you live?: Yes      Need for Family Participation in Patient Care: No (Comment) Care giver support system in place?: No (comment) Current home services: Home RN Criminal Activity/Legal Involvement Pertinent to Current Situation/Hospitalization: No - Comment as needed  Activities of Daily Living Home Assistive Devices/Equipment: None ADL Screening (condition at time of admission) Patient's cognitive ability adequate to safely complete daily activities?: Yes Is the patient deaf or have difficulty hearing?:  No Does the patient have difficulty seeing, even when wearing glasses/contacts?: No Does the patient have difficulty concentrating, remembering, or making decisions?: No Patient able to express need for assistance with ADLs?: Yes Does the patient have difficulty dressing or bathing?: No Independently performs ADLs?: Yes (appropriate for developmental age) Does the patient have difficulty walking or climbing stairs?: Yes Weakness of Legs: None Weakness of Arms/Hands: None  Permission Sought/Granted Permission sought to share information with : Case Manager, Family Supports, PCP Permission granted to share information with : Yes, Verbal Permission Granted  Share Information with NAME: Elena Cothern     Permission granted to share info w Relationship: brother  Permission granted to share info w Contact Information: [phone]  Emotional Assessment Appearance:: Appears stated age Attitude/Demeanor/Rapport: Engaged Affect (typically observed): Accepting Orientation: : Oriented to Self, Oriented to Place, Oriented to  Time, Oriented to Situation   Psych Involvement: No (comment)  Admission diagnosis:  Acute on chronic combined systolic and diastolic congestive heart failure (HCC) [I50.43] Acute on chronic systolic (congestive) heart failure (HCC) [I50.23] Patient Active Problem List   Diagnosis Date Noted   Frequent PVCs 04/10/2022   Acute kidney injury superimposed on chronic kidney disease (HCC) 04/09/2022   Acute on chronic respiratory failure with hypoxia (HCC) 04/09/2022   Goals of care, counseling/discussion    Acute on chronic systolic (congestive) heart failure (HCC) 04/08/2022   Right ankle pain 01/27/2022   Dyslipidemia 01/27/2022   Hypotension 03/05/2020  Hyperkalemia 03/05/2020   Elevated WBC count 03/05/2020   Chronic bilateral pleural effusions    Coronary artery disease involving native coronary artery of native heart without angina pectoris 07/22/2009    SYSTOLIC HEART FAILURE, CHRONIC 07/22/2009   HYPERTENSION 07/07/2009   CAD 07/07/2009   TACHYCARDIA 07/07/2009   PCP:  Default, Provider, MD Pharmacy:   [facility] - [place], [place] - [address] [place] [address] Phone: [phone] Fax: [phone]  [facility] [address] [place] [address] Phone: [phone] Fax: [phone]  [facility] [address] [place] [address] Phone: [phone] Fax: [phone]  [facility] [address] [place] [address] Phone: [phone] Fax: [phone]     Social Determinants of Health (SDOH) Interventions    Readmission Risk Interventions     No data to display

## 2022-04-13 ENCOUNTER — Telehealth: Payer: Self-pay

## 2022-04-13 ENCOUNTER — Inpatient Hospital Stay (HOSPITAL_COMMUNITY): Payer: Medicaid Other

## 2022-04-13 ENCOUNTER — Encounter (HOSPITAL_COMMUNITY): Payer: Self-pay | Admitting: Internal Medicine

## 2022-04-13 ENCOUNTER — Inpatient Hospital Stay (HOSPITAL_COMMUNITY): Payer: Medicaid Other | Admitting: Critical Care Medicine

## 2022-04-13 ENCOUNTER — Encounter (HOSPITAL_COMMUNITY)
Admission: EM | Disposition: A | Discharge status: Home / Self Care | Payer: Self-pay | Source: Home / Self Care | Attending: Internal Medicine

## 2022-04-13 DIAGNOSIS — I083 Combined rheumatic disorders of mitral, aortic and tricuspid valves: Secondary | ICD-10-CM

## 2022-04-13 DIAGNOSIS — N179 Acute kidney failure, unspecified: Secondary | ICD-10-CM | POA: Diagnosis not present

## 2022-04-13 DIAGNOSIS — I2510 Atherosclerotic heart disease of native coronary artery without angina pectoris: Secondary | ICD-10-CM

## 2022-04-13 DIAGNOSIS — I340 Nonrheumatic mitral (valve) insufficiency: Secondary | ICD-10-CM

## 2022-04-13 DIAGNOSIS — I5022 Chronic systolic (congestive) heart failure: Secondary | ICD-10-CM

## 2022-04-13 DIAGNOSIS — I5023 Acute on chronic systolic (congestive) heart failure: Secondary | ICD-10-CM | POA: Diagnosis not present

## 2022-04-13 DIAGNOSIS — I110 Hypertensive heart disease with heart failure: Secondary | ICD-10-CM | POA: Diagnosis not present

## 2022-04-13 DIAGNOSIS — J9621 Acute and chronic respiratory failure with hypoxia: Secondary | ICD-10-CM | POA: Diagnosis not present

## 2022-04-13 HISTORY — PX: TEE WITHOUT CARDIOVERSION: SHX5443

## 2022-04-13 LAB — BASIC METABOLIC PANEL
Anion gap: 5 (ref 5–15)
BUN: 35 mg/dL — ABNORMAL HIGH (ref 6–20)
CO2: 25 mmol/L (ref 22–32)
Calcium: 8.8 mg/dL — ABNORMAL LOW (ref 8.9–10.3)
Chloride: 112 mmol/L — ABNORMAL HIGH (ref 98–111)
Creatinine, Ser: 1.52 mg/dL — ABNORMAL HIGH (ref 0.61–1.24)
GFR, Estimated: 52 mL/min — ABNORMAL LOW (ref >60)
Glucose, Bld: 255 mg/dL — ABNORMAL HIGH (ref 70–99)
Potassium: 4.4 mmol/L (ref 3.5–5.1)
Sodium: 142 mmol/L (ref 135–145)

## 2022-04-13 LAB — COOXEMETRY PANEL
Carboxyhemoglobin: 1.4 % (ref 0.5–1.5)
Methemoglobin: <0.7 % (ref 0.0–1.5)
O2 Saturation: 62.6 %
Total hemoglobin: 11.7 g/dL — ABNORMAL LOW (ref 12.0–16.0)

## 2022-04-13 LAB — PHOSPHORUS: Phosphorus: 3.8 mg/dL (ref 2.5–4.6)

## 2022-04-13 LAB — MAGNESIUM: Magnesium: 2.6 mg/dL — ABNORMAL HIGH (ref 1.7–2.4)

## 2022-04-13 SURGERY — ECHOCARDIOGRAM, TRANSESOPHAGEAL
Anesthesia: Monitor Anesthesia Care

## 2022-04-13 MED ORDER — GLYCOPYRROLATE PF 0.2 MG/ML IJ SOSY
PREFILLED_SYRINGE | INTRAMUSCULAR | Status: DC | PRN
  Administered 2022-04-13: .1 mg via INTRAVENOUS

## 2022-04-13 MED ORDER — SODIUM CHLORIDE 0.9% FLUSH
10.0000 mL | Freq: Two times a day (BID) | INTRAVENOUS | Status: DC
  Administered 2022-04-13 – 2022-04-16 (×5): 10 mL
  Administered 2022-04-16: 40 mL
  Administered 2022-04-17: 10 mL
  Administered 2022-04-17: 40 mL

## 2022-04-13 MED ORDER — FUROSEMIDE 10 MG/ML IJ SOLN
80.0000 mg | Freq: Once | INTRAMUSCULAR | Status: AC
  Administered 2022-04-13: 80 mg via INTRAVENOUS
  Filled 2022-04-13: qty 8

## 2022-04-13 MED ORDER — FUROSEMIDE 40 MG PO TABS: 40.0000 mg | ORAL_TABLET | Freq: Every day | ORAL | Status: DC

## 2022-04-13 MED ORDER — PHENYLEPHRINE HCL-NACL 20-0.9 MG/250ML-% IV SOLN
INTRAVENOUS | Status: DC | PRN
  Administered 2022-04-13: 25 ug/min via INTRAVENOUS

## 2022-04-13 MED ORDER — PROPOFOL 10 MG/ML IV BOLUS
INTRAVENOUS | Status: DC | PRN
  Administered 2022-04-13 (×5): 10 mg via INTRAVENOUS
  Administered 2022-04-13: 20 mg via INTRAVENOUS

## 2022-04-13 MED ORDER — SODIUM CHLORIDE 0.9% FLUSH: 10.0000 mL | INTRAVENOUS | Status: DC | PRN

## 2022-04-13 MED ORDER — PROPOFOL 500 MG/50ML IV EMUL
INTRAVENOUS | Status: DC | PRN
  Administered 2022-04-13: 150 ug/kg/min via INTRAVENOUS

## 2022-04-13 MED ORDER — EPHEDRINE SULFATE-NACL 50-0.9 MG/10ML-% IV SOSY
PREFILLED_SYRINGE | INTRAVENOUS | Status: DC | PRN
  Administered 2022-04-13 (×4): 2.5 mg via INTRAVENOUS

## 2022-04-13 NOTE — Inpatient Diabetes Management (Addendum)
Inpatient Diabetes Program Recommendations  AACE/ADA: New Consensus Statement on Inpatient Glycemic Control (2015)  Target Ranges:  Prepandial:   less than 140 mg/dL      Peak postprandial:   less than 180 mg/dL (1-2 hours)      Critically ill patients:  140 - 180 mg/dL   Lab Results  Component Value Date   GLUCAP 137 (H) 01/27/2022   HGBA1C 5.6 01/18/2022    Review of Glycemic Control  Latest Reference Range & Units 04/13/22 04:21  Glucose 70 - 99 mg/dL 450 (H)   Diabetes history: None  Ordered: Farxiga 10 mg Daily  Lab glucose elevated this am at 255   -   consider checking CBG and if elevated start Novolog 0-6 units tid + hs  Thanks, Christena Deem RN, MSN, BC-ADM Inpatient Diabetes Coordinator Team Pager [phone] (8a-5p)

## 2022-04-13 NOTE — Progress Notes (Signed)
Progress Note   Patient: Zachary Arias YHC:[id number] DOB: May 15, 1961 DOA: 04/08/2022     5 DOS: the patient was seen and examined on 04/13/2022   Brief hospital course: Zachary Arias was admitted to the hospital with the working diagnosis of decompensated heart failure.   61 year old male with past medical history of advanced systolic congestive heart failure (Echo 12/2021 EF 30-35%) on continuous dobutamine infusion, (end stage and inotropic dependent) coronary artery disease (cath 02/2020 Dist RCA 100% stenosed, prox RCA 50% stenosed, Mid Cx/Mid LAD/1st Mrg all 30-40%), chronic hypoxic respiratory failure (on 2lpm via [place] at home), chronic kidney disease stage IIIa who presents with dyspnea.  Recent hospitalization 04/23 to 02/04/22 for heart failure with low output cardiogenic shock, he was diuresed and discharged home with continuous infusion of dobutamine along with hospice.  Reported 3 to 4 days of worsening dyspnea and weakness, worse with exertion, refractive to a flow increase in his home supplemental 02. On his initial physical examination his blood pressure was 99.75, HR 85, RR 15 and 02 saturation 100%, lungs with rales bilateral with no wheezing, increase work of breathing, heart with S1 and S2 present with no gallops or murmurs, no abdominal distention and positive lower extremity edema.   Na 145, K 3,8 CL 109, bicarbonate 23, glucose 115 bun 22 cr 1,65 AST 94, ALT 226, total Bil 1,7  BNP 3,236  High sensitive troponin 28 and 33  Wbc 98 hgb 12,5 plt 269  Sars covid 19 negative   Chest radiograph with cardiomegaly, bilateral hilar vascular congestion, no infiltrates or effusions.   EKG 87 bpm, normal axis, qtc 577, sinus rhythm with left atrial enlargement, ST depression V5 and V6 with no significant T wave changes.   Patient had diuresis for volume overload with improvement in his symptoms.  07/17 Cardiac catheterization with stable coronary artery disease, moderate reduced cardiac  output and prominent v waved in PCWP suggesting significant mitral regurgitation.    07/18 TEE, LV EF 30 to 35%, severe mitral regurgitation.   Assessment and Plan: * Acute on chronic systolic (congestive) heart failure (HCC) 04/23 echocardiogram with LV systolic function 30 to 35%, global hypokinesis, preserved RV systolic function, severe enlargement of pulmonary artery with RVSP 64,0 mmHg. Severe LA enlargement. Severe mitral regurgitation, moderate TR, posterior pericardial effusion.   Troponin elevation due to heart failure decompensation.   Severe pulmonary hypertension, possible class 2 (acute on chronic core pulmonale).   07/17 cardiac catheterization with PA mean 39, PCWP 23, cardiac output 4,1 and index 2,3 per Fick.   07/18 TEE with LV EF 30 to 35%, hypokinesis of the basal to mid inferior wall. Mitral valve with poor coaptation. Posterior leaflet restricted. Severe 4 + central and posterior mitral regurgitation.   Urine output is 1,175  ml  Systolic blood pressure is 106 to 91 mmHg.   Medical therapy with dobutamine, digoxin, dapagliflozin and spironolactone.  PRN furosemide.  Continue telemetry monitoring.  Follow up with structural heart team recommendations.     Coronary artery disease involving native coronary artery of native heart without angina pectoris Continue with aspirin and statin.    Acute kidney injury superimposed on chronic kidney disease (HCC) CKD stage 3b. Hypernatremia.   Serum cr today is 1,52 with K at 4,4 and serum bicarbonate at 25. NA 142 Mg 2.6   Plan to continue diuresis with empagliflozin and spironolactone.  PRN furosemide.  Follow up renal function in am.    Acute on chronic respiratory failure with hypoxia (  HCC) Patient with severe pulmonary hypertension. On admission signs of acute cardiogenic pulmonary edema.   Respiratory failure has resolved with oxygenation today at 98% on room air.   Frequent PVCs Continue with  amiodarone.  Telemetry monitoring.   Goals of care, counseling/discussion Patient has been in hospice but now considering possible advance heart failure interventions.  Code status is DNR         Subjective: patient is feeling well, no dyspnea or chest pain, no nausea or vomiting.   Physical Exam: Vitals:   04/13/22 0951 04/13/22 1000 04/13/22 1015 04/13/22 1030  BP: 90/68 91/72 94/73  94/78  Pulse: 77 79 84 82  Resp: 18 15 19 20   Temp: 98.2 F (36.8 C)     TempSrc: Oral     SpO2: 98% 99% 100% 98%  Weight:      Height:       Neurology awake and alert ENT With mild apllor Cardiovascular with S1 and S2 present and rhythmic Respiratory with no rales  Abdomen not distended No lower extremity edema  Data Reviewed:    Family Communication: no family at the bedside   Disposition: Status is: Inpatient Remains inpatient appropriate because: heart failure   Planned Discharge Destination: Home    Author: , MD 04/13/2022 2:45 PM  For on call review [email or web address].

## 2022-04-13 NOTE — Interval H&P Note (Signed)
History and Physical Interval Note:  04/13/2022 8:22 AM  Zachary Arias  has presented today for surgery, with the diagnosis of SEVERE MITRAL REGURGITATION, TRICUSPID REGURGITATION.  The various methods of treatment have been discussed with the patient and family. After consideration of risks, benefits and other options for treatment, the patient has consented to  Procedure(s): TRANSESOPHAGEAL ECHOCARDIOGRAM (TEE) (N/A) as a surgical intervention.  The patient's history has been reviewed, patient examined, no change in status, stable for surgery.  I have reviewed the patient's chart and labs.  Questions were answered to the patient's satisfaction.     Hermes Wafer

## 2022-04-13 NOTE — H&P (View-Only) (Signed)
Advanced Heart Failure Rounding Note  PCP-Cardiologist: Dr. Haroldine Laws   Subjective:    Remains on DBA at 5. Admit co-ox ok . No repeat CO-OX. Ordered  Echo 04/10/22 EF 25-30% Severe MR/TR. Moderate RV HK   7/17 RHC/LHC - on DBA 5. Stable 1V CAD, moderate mixed pulmonary HTN, moderately reduced CO on DBA 5, prominent v waves in PCWP tracing suggestive of significant MR   SOB over night. Doesn't feel as good today.   Objective:   Weight Range: 63.6 kg Body mass index is 21.32 kg/m.   Vital Signs:   Temp:  [97.6 F (36.4 C)-99.9 F (37.7 C)] 97.9 F (36.6 C) (07/18 0413) Pulse Rate:  [82-95] 86 (07/18 0413) Resp:  [13-20] 18 (07/18 0413) BP: (100-139)/(66-83) 105/71 (07/18 0413) SpO2:  [85 %-100 %] 100 % (07/18 0413) Weight:  [63.6 kg] 63.6 kg (07/18 0413) Last BM Date : 04/09/22  Weight change: Filed Weights   04/11/22 0410 04/12/22 0502 04/13/22 0413  Weight: 64.7 kg 63.4 kg 63.6 kg    Intake/Output:   Intake/Output Summary (Last 24 hours) at 04/13/2022 0732 Last data filed at 04/13/2022 0616 Gross per 24 hour  Intake 369.51 ml  Output 1175 ml  Net -805.49 ml     CVP 20 Physical Exam  General:  No resp difficulty HEENT: normal Neck: supple. JVP to jaw . Carotids 2+ bilat; no bruits. No lymphadenopathy or thryomegaly appreciated. Cor: PMI nondisplaced. Regular rate & rhythm. No rubs, gallops. 2/6 MR/TR. Lungs: clear Abdomen: soft, nontender, nondistended. No hepatosplenomegaly. No bruits or masses. Good bowel sounds. Extremities: no cyanosis, clubbing, rash, edema. RUE PICC Neuro: alert & orientedx3, cranial nerves grossly intact. moves all 4 extremities w/o difficulty. Affect flat   Telemetry   SR 80-90s personally checked.   Labs    CBC Recent Labs    04/12/22 0450 04/12/22 1350 04/12/22 1352  WBC 12.7*  --   --   HGB 12.0* 11.9*  12.6* 11.9*  HCT 36.6* 35.0*  37.0* 35.0*  MCV 85.7  --   --   PLT 196  --   --    Basic Metabolic  Panel Recent Labs    04/12/22 0450 04/12/22 1350 04/12/22 1352 04/13/22 0421  NA 144   < > 150* 142  K 4.2   < > 3.7 4.4  CL 112*  --   --  112*  CO2 24  --   --  25  GLUCOSE 130*  --   --  255*  BUN 33*  --   --  35*  CREATININE 1.49*  --   --  1.52*  CALCIUM 8.7*  --   --  8.8*  MG 2.6*  --   --  2.6*  PHOS  --   --   --  3.8   < > = values in this interval not displayed.   Liver Function Tests No results for input(s): "AST", "ALT", "ALKPHOS", "BILITOT", "PROT", "ALBUMIN" in the last 72 hours. No results for input(s): "LIPASE", "AMYLASE" in the last 72 hours. Cardiac Enzymes No results for input(s): "CKTOTAL", "CKMB", "CKMBINDEX", "TROPONINI" in the last 72 hours.  BNP: BNP (last 3 results) Recent Labs    02/12/22 1432 03/09/22 1540 04/08/22 2055  BNP 562.1* 503.3* 3,236.0*    ProBNP (last 3 results) No results for input(s): "PROBNP" in the last 8760 hours.   D-Dimer No results for input(s): "DDIMER" in the last 72 hours. Hemoglobin A1C No results for input(s): "HGBA1C"  in the last 72 hours. Fasting Lipid Panel No results for input(s): "CHOL", "HDL", "LDLCALC", "TRIG", "CHOLHDL", "LDLDIRECT" in the last 72 hours. Thyroid Function Tests No results for input(s): "TSH", "T4TOTAL", "T3FREE", "THYROIDAB" in the last 72 hours.  Invalid input(s): "FREET3"  Other results:   Imaging    CARDIAC CATHETERIZATION  Result Date: 04/12/2022   Mid LAD lesion is 40% stenosed.   Mid Cx lesion is 30% stenosed.   Prox RCA lesion is 50% stenosed.   Dist RCA lesion is 100% stenosed.   1st Mrg lesion is 30% stenosed. Findings: On DBA 5 Ao = 89/68 (79) LV = 93/18 RA = 9 RV = 67/15 PA = 65/26 (39) PCW = 23 (v = 31) Fick cardiac output/index = 4.1/2.3 PVR = 3.9 WU FA sat = 98% PA sat = 64%, 65% PAPi = 4.3 Assessment: 1. Stable 1v CAD 2. Moderate mixed pulmonary HTN 3. Moderately reduced CO on DBA 5 4. Prominent v waves in PCWP tracing suggestive of significant MR Plan/Discussion:  Plan TEE to further evaluate. Suspect he will need advanced therapies. Glori Bickers, MD 2:20 PM     Medications:     Scheduled Medications:  amiodarone  200 mg Oral BID   aspirin EC  81 mg Oral Daily   atorvastatin  40 mg Oral Daily   Chlorhexidine Gluconate Cloth  6 each Topical Daily   dapagliflozin propanediol  10 mg Oral Daily   digoxin  0.0625 mg Oral Daily   enoxaparin (LOVENOX) injection  40 mg Subcutaneous Q24H   sodium chloride flush  10-40 mL Intracatheter Q12H   sodium chloride flush  3 mL Intravenous Q12H   sodium chloride flush  3 mL Intravenous Q12H   spironolactone  12.5 mg Oral Daily    Infusions:  sodium chloride     DOBUTamine 5 mcg/kg/min (04/13/22 0616)    PRN Medications: sodium chloride, acetaminophen **OR** acetaminophen, LORazepam, ondansetron **OR** ondansetron (ZOFRAN) IV, polyethylene glycol, sodium chloride flush, sodium chloride flush    Patient Profile   61 y.o. 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 Cath with 1-v CAD with chronically occluded RCA. MRI in January 2011 with EF 31% with inferior scar. Recent admit 4/23 for a/c CHF w/ low output, requiring DBA. He is end-stage and inotrope dependent, on home DBA. Not a good candidate for advanced therapies with lack of support system, and non-citizen status and does not want this. Has been followed by home hospice.   Presented to ED w/ CC of progressive dyspnea and increased supp O2 requirements.   BNP 3,236. CXR w/ vascular congestion.    Assessment/Plan   1. Acute on Chronic Systolic Heart Failure - Cath with 1-v CAD with chronically occluded RCA. MRI in January 2011 with EF 31% with inferior scar.  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   - Echo (8/13): EF 45-50% .  - No f/u between to 2015-2021 - Echo (6/21): EF 20-25% severe central MR. Repeat cath 6/21 with  stable CAD - Echo (4/22): EF 40-45% mild to mod MR RV mild HK  - Echo (4/23): EF 30-35%.   - Admit 4/23 with shock, started on DBA and lasix. Diuresed 20 lbs. Unable to wean DBA - He is end-stage and inotrope dependent. - On Home DBA 5 mcg/kg/min via single lumen PICC. Under hospice care - Admitted with a/c HF. Diuresed with IV lasix - Echo 04/10/22 EF  25-30% Severe MR/TR. Moderate RV HK  - CVP 20 today. Volume status trending up. Give 80 mg IV lasix.  - Continue Farxiga 10 mg daily  - Continue digoxin 0.0625 mg daily.  - Continue spiro 12.5 mg daily. - renal function stable.  - Long discussion with him again on 7/15 regarding options. He is interested in advanced therapies now. We discussed previous barriers: lack of insurance, non-compliance and lack of supportive caregiver. I met with SW. He now has insurance to cover. We discussed pros/cons transplant vs VAD. Echo with EF 25-30% on DBA with severe MR/TR.  - RHC/LHC with stable 1V disease, moderate mixed pulmonary HTN, moderately reduced CO on DBA 5, and prominent v waves in PCWP tracing suggestive of significant MR - TEE today   2. AKI on CKD Stage IIIa likely due to ATN/overdiuresis - Creatinine baseline 1.6 - 1.82 on admit>>1.5  today  - follow BMP    3. PVCs - Continue amio 200 mg bid. - K and Mag stable.   4. CAD  - Cath with 1-v CAD with chronically occluded RCA - Repeat LHC with stable 1V CAD.  - No s/s angina - Continue ASA and atorvastatin.   5. Transaminitis - suspect due to passive congestion vs low output (co-ox ok)  6. Severe MR/TR - ? mTEER candidate to forestall need for advanced therapies  -  TEE today.  Dr Haroldine Laws discussing with structural heart team.    Length of Stay: [place], NP  04/13/2022, 7:32 AM  Advanced Heart Failure Team Pager [phone] (M-F; 7a - 5p)  Please contact [facility] for night-coverage after hours (5p -7a ) and weekends on [email or web address]  Patient seen and examined with the  above-signed Advanced Practice Provider and/or Housestaff. I personally reviewed laboratory data, imaging studies and relevant notes. I independently examined the patient and formulated the important aspects of the plan. I have edited the note to reflect any of my changes or salient points. I have personally discussed the plan with the patient and/or family.  Cath yesterday with stable 1v cad. Hemodynamics suggestive of significant MR  Remains on DBA 5. Feels weak. SOB today Co-ox 63%. CVP 15-20  General:  Weak appearing. No resp difficulty HEENT: normal Neck: supple. JVP to ear  Carotids 2+ bilat; no bruits. No lymphadenopathy or thryomegaly appreciated. Cor: PMI nondisplaced. Regular rate & rhythm. 2/6 MR/TR Lungs: clear Abdomen: soft, nontender, nondistended. No hepatosplenomegaly. No bruits or masses. Good bowel sounds. Extremities: no cyanosis, clubbing, rash, edema Neuro: alert & orientedx3, cranial nerves grossly intact. moves all 4 extremities w/o difficulty. Affect pleasant  Remains tenuous. He has been presented at [place] and felt not to be a transplant candidate due to issues with transportation, noncompliance and lack of caregiver support. Also felt to be poor VAD candidate for similar reasons.   Will plan TEE today to see if we can stabilize with mTEER.   Diurese with IV lasix.   Glori Bickers, MD  8:21 AM

## 2022-04-13 NOTE — Consult Note (Addendum)
[facility]   MULTIDISCIPLINARY HEART VALVE TEAM  Inpatient MitraClip Consultation:   Patient ID: Zachary Arias; [id number]; 03-23-1961   Admit date: 04/08/2022 Date of Consult: 04/13/2022  Primary Care Provider: Default, Provider, MD Primary Cardiologist: Dr. Gala Romney, MD  Patient Profile:   Zachary Arias is a 61 y.o. male with a hx of mixed ischemic and non-ischemic end-stage, dobutamine dependent cardiomyopathy with LVEF in the 25-30% range (with familial component dx 05/2009), CAD with chronically occluded RCA on last LHC, CKD stage IIIa, PVCs, and severe mitral and tricuspid regurgitation who is being seen today for the evaluation for potential TEER at the request of Dr. Gala Romney.  History of Present Illness:   Zachary Arias lives alone in [place]. He has very poor social support. He has one brother who lives in [place] although they only speak every few months. His closest friend recently moved to [place]. He spends much of his time in his apartment. He is able to manage ADL/IADLs without issues. He ambulates without assistive devices and can go up and down his stairs ok if he goes slowly. He has not seen a dentist since he was in the [place], however appears to have no acute issues with broken or infected teeth. He is originally from [place] and came to the [place] when he was 61yo. He was living in [place] at that time. He served in the [employer] which took him to [place] where he worked for [employer]. He was eventually transferred to [facility] and has been here ever since.   He has been followed by Dr. Gala Romney for many years however was lost to follow up from 2015 until 2021. In 02/2020, he was admitted for cardiogenic shock after stopping his medications for several months. Echocardiogram at that time with LVEF at 20-25% with normal RV function, and severe MR. He was placed on amiodarone for frequent PVCs. R/LHC  03/05/2020 with stable CAD with chronically occluded RCA and  otherwise moderate non-obstructive CAD. He had low filling pressures and normal CO on milrinone. This was weaned off and he was restarted on GDMT. Repeat echocardiogram from 12/2020 with slightly improved EF at 40-45% with mild to moderate MR with mild RV hypokinesis.   He was lost to follow up until 10/2021 when he was seen again after being off all his medications once again x 3 months. REDs reading at that time was elevated at 34%. He was restarted on meds. ZIO was placed to assess PVC burden which showed frequent NSVT and frequent PVCs at 13.1% burden. He was restarted on amiodarone and referred to EP.   He was readmitted once again 01/17/22-02/04/22 with acute CHF c/b cardiogenic shock after stopping his medications for one month. He was managed with a combination of inotropes including dobutamine and Lasix during his hospital course. By day of discharge, he had lost approximately 25lb and PICC line was placed for [facility] dobutamine infusions and he was referred to hospice care services  as this was the only option at that time given he is not a [place] citizen and was uninsured.   He was initially doing well however about 3-4 days after going home, he began experiencing increased fatigue, weakness, and SOB that was worse with exertion. He attempted to supplement with increased oxygen support without improvement. Due to his symptoms, EMS was contacted and patient was transferred to [facility] for further evaluation. Shortly after arrival, cardiology was consulted. He was given additional Lasix with recommendations to have AHF team  to follow. Labs as follows: Creatinine 1.82, BNP at 3236, mild trop bump at 28>>33, and CXR with cardiomegaly with vascular congestion.   He underwent R/LHC on 04/12/22 with stable one vessel CAD, moderate mixed pulmonary HTN, moderately reduced CO on DBA 5, and prominent V waves in PCWP tracing suggestive of significant MR. Subsequent TEE performed 04/12/22 that showed poor coaptation with  posterior leaflet restriction and severe 4+ central/posterior MR with systolic flow reversal in PVs  His case was presented at [place] clinic meeting and felt not to be a good candidate for transplant or VAD given issues with transportation, noncompliance and lack of caregiver support.   On my exam, he has no c/o chest pain, palpitations, LE edema, orthopnea, dizziness, or syncope. He is sitting on the side of the bed and is getting ready to ambulate with PT.   Past Medical History:  Diagnosis Date   Congestive heart failure (CHF) ([place])    secondary to ischemic cardiomyopathy with an ejection fraction of 25-30%   Coronary artery disease    History of hypertension    Inguinal hernia 2005   History of left inguinal hernia, status post repair in 2005   Tachycardia 2010     Unexplained tachycardia during hospitalization    Past Surgical History:  Procedure Laterality Date   CARDIAC CATHETERIZATION   06/27/2009   ejection fraction of approximately 35%   INGUINAL HERNIA REPAIR  01/27/2004   History of left inguinal hernia, status post repair in 2005   RIGHT/LEFT HEART CATH AND CORONARY ANGIOGRAPHY N/A 03/10/2020   Procedure: RIGHT/LEFT HEART CATH AND CORONARY ANGIOGRAPHY;  Surgeon: Jolaine Artist, MD;  Location: [place] [facility];  Service: Cardiovascular;  Laterality: N/A;   RIGHT/LEFT HEART CATH AND CORONARY ANGIOGRAPHY N/A 04/12/2022   Procedure: RIGHT/LEFT HEART CATH AND CORONARY ANGIOGRAPHY;  Surgeon: Jolaine Artist, MD;  Location: [place] [facility];  Service: Cardiovascular;  Laterality: N/A;     Inpatient Medications: Scheduled Meds:  amiodarone  200 mg Oral BID   aspirin EC  81 mg Oral Daily   atorvastatin  40 mg Oral Daily   Chlorhexidine Gluconate Cloth  6 each Topical Daily   dapagliflozin propanediol  10 mg Oral Daily   digoxin  0.0625 mg Oral Daily   enoxaparin (LOVENOX) injection  40 mg Subcutaneous Q24H   sodium chloride flush  10-40 mL Intracatheter Q12H   sodium  chloride flush  3 mL Intravenous Q12H   sodium chloride flush  3 mL Intravenous Q12H   spironolactone  12.5 mg Oral Daily   Continuous Infusions:  sodium chloride     DOBUTamine 5 mcg/kg/min (04/13/22 0840)   PRN Meds: sodium chloride, acetaminophen **OR** acetaminophen, LORazepam, ondansetron **OR** ondansetron (ZOFRAN) IV, polyethylene glycol, sodium chloride flush, sodium chloride flush  Allergies:    Allergies  Allergen Reactions   Avocado Nausea Only   Shellfish Allergy Nausea And Vomiting    Social History:   Social History   Socioeconomic History   Marital status: Single    Spouse name: Not on file   Number of children: Not on file   Years of education: Not on file   Highest education level: Not on file  Occupational History   Not on file  Tobacco Use   Smoking status: Never   Smokeless tobacco: Never  Vaping Use   Vaping Use: Not on file  Substance and Sexual Activity   Alcohol use: Not Currently   Drug use: Not Currently   Sexual  activity: Not on file  Other Topics Concern   Not on file  Social History Narrative   Not on file   Social Determinants of Health   Financial Resource Strain: High Risk (01/21/2022)   Overall Financial Resource Strain (CARDIA)    Difficulty of Paying Living Expenses: Hard  Food Insecurity: Food Insecurity Present (01/21/2022)   Hunger Vital Sign    Worried About Running Out of Food in the Last Year: Sometimes true    Ran Out of Food in the Last Year: Sometimes true  Transportation Needs: Unmet Transportation Needs (03/08/2022)   PRAPARE - Hydrologist (Medical): Yes    Lack of Transportation (Non-Medical): Yes  Physical Activity: Not on file  Stress: Not on file  Social Connections: Not on file  Intimate Partner Violence: Not on file    Family History:   The patient's family history includes Cancer in his mother; Cardiomyopathy in his brother; Heart attack in his father; Heart disease in his  father.  ROS:  Please see the history of present illness.  All other ROS reviewed and negative.     Physical Exam/Data:   Vitals:   04/13/22 0951 04/13/22 1000 04/13/22 1015 04/13/22 1030  BP: 90/68 91/72 94/73  94/78  Pulse: 77 79 84 82  Resp: 18 15 19 20   Temp: 98.2 F (36.8 C)     TempSrc: Oral     SpO2: 98% 99% 100% 98%  Weight:      Height:        Intake/Output Summary (Last 24 hours) at 04/13/2022 1204 Last data filed at 04/13/2022 1146 Gross per 24 hour  Intake 594.77 ml  Output 1375 ml  Net -780.23 ml   Filed Weights   04/12/22 0502 04/13/22 0413 04/13/22 0807  Weight: 63.4 kg 63.6 kg 63.6 kg   Body mass index is 21.32 kg/m.   General: Well developed, well nourished, NAD Neck: Negative for carotid bruits. + JVD Lungs:Clear to ausculation bilaterally. Breathing is unlabored on 2L Cardiovascular: RRR with S1 S2. + systolic murmur.  Abdomen: Soft, non-tender, mild distention. No obvious abdominal masses. Extremities: No edema. Compression stocking in place.  Neuro: Alert and oriented. No focal deficits. No facial asymmetry. MAE spontaneously. Psych: Responds to questions appropriately with normal affect.    EKG:  The EKG was personally reviewed and demonstrates: 04/08/22 NSR with HR 87bpm and repol abnormalities.   Telemetry:  Telemetry was personally reviewed and demonstrates: 04/13/22 NSR with HR in the mid 80s  Relevant CV Studies:  TEE 04/13/22:   FINDINGS:   LEFT VENTRICLE: EF = Moderately dilated. EF 30-35% Hypokinesis of the basal to mid inferior wall  RIGHT VENTRICLE: Moderate HK   LEFT ATRIUM: Severely dilated   LEFT ATRIAL APPENDAGE: No clot   RIGHT ATRIUM: Moderately dilated   AORTIC VALVE:  Trileaflet. Trivial AI   MITRAL VALVE:    Poor coaptation. Posterior leaflet restricted. Severe 4+ central/posterior MR. + systolic flow reversal in PVs   TRICUSPID VALVE: Moderate TR   PULMONIC VALVE: Normal   INTERATRIAL SEPTUM: No PFO/ASD    PERICARDIUM: Normal   DESCENDING AORTA: Mild plaque   Looks to have a mix of ischemic MR and MR due to LV dilation. Suspect good mTEER candidate. Will review with structural team.   R/LHC:     Mid LAD lesion is 40% stenosed.   Mid Cx lesion is 30% stenosed.   Prox RCA lesion is 50% stenosed.   Dist RCA lesion  is 100% stenosed.   1st Mrg lesion is 30% stenosed.   Findings:   On DBA 5   Ao = 89/68 (79) LV = 93/18 RA = 9 RV = 67/15 PA = 65/26 (39) PCW = 23 (v = 31) Fick cardiac output/index = 4.1/2.3 PVR = 3.9 WU FA sat = 98% PA sat = 64%, 65% PAPi = 4.3   Assessment: 1. Stable 1v CAD 2. Moderate mixed pulmonary HTN 3. Moderately reduced CO on DBA 5 4. Prominent v waves in PCWP tracing suggestive of significant MR   Plan/Discussion:   Plan TEE to further evaluate. Suspect he will need advanced therapies.  Echocardiogram 04/10/22:   1. Left ventricular ejection fraction, by estimation, is 30 to 35%. The  left ventricle has moderate to severely decreased function. The left  ventricle demonstrates global hypokinesis. The left ventricular internal  cavity size was moderately to severely  dilated. There is mild left ventricular hypertrophy. Left ventricular  diastolic parameters are consistent with Grade III diastolic dysfunction  (restrictive). Elevated left atrial pressure.   2. Right ventricular systolic function is normal. The right ventricular  size is mildly enlarged. There is severely elevated pulmonary artery  systolic pressure.   3. Left atrial size was severely dilated.   4. Right atrial size was severely dilated.   5. Dilated ventricle leading to poor MV coaptation and severe functional  MR.. The mitral valve is abnormal. Severe mitral valve regurgitation. No  evidence of mitral stenosis.   6. Hepatic systolic flow reversal consistent with severe TR      . The tricuspid valve is abnormal. Tricuspid valve regurgitation is  severe.   7. The aortic valve  has an indeterminant number of cusps. Aortic valve  regurgitation is not visualized. No aortic stenosis is present.   8. The inferior vena cava is dilated in size with <50% respiratory  variability, suggesting right atrial pressure of 15 mmHg.   Echocardiogram 01/20/22:   1. Left ventricular ejection fraction, by estimation, is 30 to 35%. The  left ventricle has moderately decreased function. The left ventricle  demonstrates global hypokinesis. The left ventricular internal cavity size  was severely dilated. Left  ventricular diastolic parameters are indeterminate. Elevated left  ventricular end-diastolic pressure.   2. Right ventricular systolic function is normal. The right ventricular  size is mildly enlarged. There is severely elevated pulmonary artery  systolic pressure. The estimated right ventricular systolic pressure is  [id number] mmHg.   3. Left atrial size was severely dilated.   4. The pericardial effusion is posterior to the left ventricle.   5. The mitral valve is degenerative. Severe mitral valve regurgitation.  No evidence of mitral stenosis.   6. Tricuspid valve regurgitation is moderate.   7. The aortic valve is normal in structure. Aortic valve regurgitation is  trivial. No aortic stenosis is present.   8. The inferior vena cava is dilated in size with <50% respiratory  variability, suggesting right atrial pressure of 15 mmHg.   Laboratory Data:  Chemistry Recent Labs  Lab 04/11/22 0420 04/12/22 0450 04/12/22 1350 04/12/22 1352 04/13/22 0421  NA 141 144 151*  149* 150* 142  K 3.6 4.2 3.7  4.0 3.7 4.4  CL 108 112*  --   --  112*  CO2 23 24  --   --  25  GLUCOSE 110* 130*  --   --  255*  BUN 32* 33*  --   --  35*  CREATININE 1.73* 1.49*  --   --  1.52*  CALCIUM 8.7* 8.7*  --   --  8.8*  GFRNONAA 45* 54*  --   --  52*  ANIONGAP 10 8  --   --  5    Recent Labs  Lab 04/09/22 0403  PROT 5.6*  ALBUMIN 3.3*  AST 94*  ALT 226*  ALKPHOS 139*  BILITOT 1.7*    Hematology Recent Labs  Lab 04/08/22 2055 04/09/22 0403 04/12/22 0450 04/12/22 1350 04/12/22 1352  WBC 9.6 9.8 12.7*  --   --   RBC 4.45 4.41 4.27  --   --   HGB 12.2* 12.5* 12.0* 11.9*  12.6* 11.9*  HCT 38.2* 38.2* 36.6* 35.0*  37.0* 35.0*  MCV 85.8 86.6 85.7  --   --   MCH 27.4 28.3 28.1  --   --   MCHC 31.9 32.7 32.8  --   --   RDW 17.2* 17.2* 17.5*  --   --   PLT 282 269 196  --   --    Cardiac EnzymesNo results for input(s): "TROPONINI" in the last 168 hours. No results for input(s): "TROPIPOC" in the last 168 hours.  BNP Recent Labs  Lab 04/08/22 2055  BNP 3,236.0*    DDimer No results for input(s): "DDIMER" in the last 168 hours.  Radiology/Studies:  CARDIAC CATHETERIZATION  Result Date: 04/12/2022   Mid LAD lesion is 40% stenosed.   Mid Cx lesion is 30% stenosed.   Prox RCA lesion is 50% stenosed.   Dist RCA lesion is 100% stenosed.   1st Mrg lesion is 30% stenosed. Findings: On DBA 5 Ao = 89/68 (79) LV = 93/18 RA = 9 RV = 67/15 PA = 65/26 (39) PCW = 23 (v = 31) Fick cardiac output/index = 4.1/2.3 PVR = 3.9 WU FA sat = 98% PA sat = 64%, 65% PAPi = 4.3 Assessment: 1. Stable 1v CAD 2. Moderate mixed pulmonary HTN 3. Moderately reduced CO on DBA 5 4. Prominent v waves in PCWP tracing suggestive of significant MR Plan/Discussion: Plan TEE to further evaluate. Suspect he will need advanced therapies. Glori Bickers, MD 2:20 PM   ECHOCARDIOGRAM COMPLETE  Result Date: 04/10/2022    ECHOCARDIOGRAM REPORT   Patient Name:   ADONUS KEAVENEY Date of Exam: 04/10/2022 Medical Rec #:  [id number]     Height:       68.0 in Accession #:    [id number]    Weight:       143.5 lb Date of Birth:  Dec 31, 1960     BSA:          1.775 m Patient Age:    29 years      BP:           93/73 mmHg Patient Gender: M             HR:           86 bpm. Exam Location:  Inpatient Procedure: 2D Echo, Cardiac Doppler, 3D Echo and Intracardiac Opacification            Agent Indications:    CHF  History:         Patient has prior history of Echocardiogram examinations, most                 recent 01/18/2022. CHF, CAD; Risk Factors:Hypertension. On                 dobutamine drip during the time of the echo.  Sonographer:  Merrie Roof RDCS Referring Phys: [place]  1. Left ventricular ejection fraction, by estimation, is 30 to 35%. The left ventricle has moderate to severely decreased function. The left ventricle demonstrates global hypokinesis. The left ventricular internal cavity size was moderately to severely dilated. There is mild left ventricular hypertrophy. Left ventricular diastolic parameters are consistent with Grade III diastolic dysfunction (restrictive). Elevated left atrial pressure.  2. Right ventricular systolic function is normal. The right ventricular size is mildly enlarged. There is severely elevated pulmonary artery systolic pressure.  3. Left atrial size was severely dilated.  4. Right atrial size was severely dilated.  5. Dilated ventricle leading to poor MV coaptation and severe functional MR.. The mitral valve is abnormal. Severe mitral valve regurgitation. No evidence of mitral stenosis.  6. Hepatic systolic flow reversal consistent with severe TR     . The tricuspid valve is abnormal. Tricuspid valve regurgitation is severe.  7. The aortic valve has an indeterminant number of cusps. Aortic valve regurgitation is not visualized. No aortic stenosis is present.  8. The inferior vena cava is dilated in size with <50% respiratory variability, suggesting right atrial pressure of 15 mmHg. FINDINGS  Left Ventricle: Left ventricular ejection fraction, by estimation, is 30 to 35%. The left ventricle has moderate to severely decreased function. The left ventricle demonstrates global hypokinesis. The left ventricular internal cavity size was moderately  to severely dilated. There is mild left ventricular hypertrophy. Left ventricular diastolic parameters are consistent with Grade  III diastolic dysfunction (restrictive). Elevated left atrial pressure. Right Ventricle: The right ventricular size is mildly enlarged. Right vetricular wall thickness was not well visualized. Right ventricular systolic function is normal. There is severely elevated pulmonary artery systolic pressure. The tricuspid regurgitant velocity is 3.47 m/s, and with an assumed right atrial pressure of 15 mmHg, the estimated right ventricular systolic pressure is [id number] mmHg. Left Atrium: Left atrial size was severely dilated. Right Atrium: Right atrial size was severely dilated. Pericardium: There is no evidence of pericardial effusion. Mitral Valve: Dilated ventricle leading to poor MV coaptation and severe functional MR. The mitral valve is abnormal. There is mild thickening of the mitral valve leaflet(s). There is mild calcification of the mitral valve leaflet(s). Mild mitral annular  calcification. Severe mitral valve regurgitation. No evidence of mitral valve stenosis. Tricuspid Valve: Hepatic systolic flow reversal consistent with severe TR. The tricuspid valve is abnormal. Tricuspid valve regurgitation is severe. No evidence of tricuspid stenosis. Aortic Valve: The aortic valve has an indeterminant number of cusps. Aortic valve regurgitation is not visualized. No aortic stenosis is present. Aortic valve mean gradient measures 2.4 mmHg. Aortic valve peak gradient measures 5.9 mmHg. Aortic valve area, by VTI measures 1.49 cm. Pulmonic Valve: The pulmonic valve was not well visualized. Pulmonic valve regurgitation is mild. No evidence of pulmonic stenosis. Aorta: The aortic root is normal in size and structure. Venous: The inferior vena cava is dilated in size with less than 50% respiratory variability, suggesting right atrial pressure of 15 mmHg. IAS/Shunts: The interatrial septum was not well visualized.  LEFT VENTRICLE PLAX 2D LVIDd:         6.80 cm LVIDs:         5.60 cm LV PW:         0.90 cm LV IVS:        0.80 cm  LVOT diam:     2.00 cm      3D Volume EF: LV SV:  32           3D EF:        47 % LV SV Index:   18           LV EDV:       223 ml LVOT Area:     3.14 cm     LV ESV:       118 ml                             LV SV:        105 ml  LV Volumes (MOD) LV vol d, MOD A2C: 196.0 ml LV vol d, MOD A4C: 173.0 ml LV vol s, MOD A2C: 106.0 ml LV vol s, MOD A4C: 89.5 ml LV SV MOD A2C:     90.0 ml LV SV MOD A4C:     173.0 ml LV SV MOD BP:      94.5 ml RIGHT VENTRICLE             IVC RV Basal diam:  4.30 cm     IVC diam: 2.70 cm RV Mid diam:    3.00 cm RV S prime:     17.30 cm/s TAPSE (M-mode): 2.4 cm LEFT ATRIUM              Index         RIGHT ATRIUM           Index LA diam:        5.20 cm  2.93 cm/m    RA Area:     23.20 cm LA Vol (A2C):   271.0 ml 152.69 ml/m  RA Volume:   74.70 ml  42.09 ml/m LA Vol (A4C):   99.6 ml  56.12 ml/m LA Biplane Vol: 179.0 ml 100.86 ml/m  AORTIC VALVE AV Area (Vmax):    1.66 cm AV Area (Vmean):   1.81 cm AV Area (VTI):     1.49 cm AV Vmax:           121.27 cm/s AV Vmean:          69.145 cm/s AV VTI:            0.215 m AV Peak Grad:      5.9 mmHg AV Mean Grad:      2.4 mmHg LVOT Vmax:         64.00 cm/s LVOT Vmean:        39.800 cm/s LVOT VTI:          0.102 m LVOT/AV VTI ratio: 0.47  AORTA Ao Root diam: 2.70 cm Ao Asc diam:  2.30 cm MR Peak grad:    76.7 mmHg    TRICUSPID VALVE MR Mean grad:    42.0 mmHg    TR Peak grad:   48.2 mmHg MR Vmax:         438.00 cm/s  TR Vmax:        347.00 cm/s MR Vmean:        300.0 cm/s MR PISA:         9.05 cm     SHUNTS MR PISA Eff ROA: 64 mm       Systemic VTI:  0.10 m MR PISA Radius:  1.20 cm      Systemic Diam: 2.00 cm Carlyle Dolly MD Electronically signed by Carlyle Dolly MD Signature Date/Time: 04/10/2022/5:29:36 PM    Final    DG CHEST PORT  1 VIEW  Result Date: 04/09/2022 CLINICAL DATA:  Shortness of breath, generalized weakness EXAM: PORTABLE CHEST 1 VIEW COMPARISON:  Previous studies including the examination of 04/08/2022 FINDINGS:  Transfer diameter heart is increased. There is slight decrease in pulmonary vascular congestion. There are no signs of pulmonary edema or new focal infiltrates. There is no pleural effusion or pneumothorax. Tip of PICC line is seen in superior vena cava. IMPRESSION: Cardiomegaly. There is interval decrease in pulmonary vascular congestion. There are no signs of alveolar pulmonary edema or new focal infiltrates. Electronically Signed   By: Elmer Picker M.D.   On: 04/09/2022 13:53    STS Risk Calculator: Isolated MVR Risk of Mortality: Risk of Mortality: 5.637% Renal Failure: 6.123% Permanent Stroke: 1.973% Prolonged Ventilation: 36.420% DSW Infection: 0.101% Reoperation: 7.246% Morbidity or Mortality: 46.327% Short Length of Stay: 9.451% Long Length of Stay: 29.305%  MV Repair  3.717% Renal Failure: 3.350% Permanent Stroke: 1.742% Prolonged Ventilation: 24.171% DSW Infection: 0.053% Reoperation: 7.159% Morbidity or Mortality: 32.849% Short Length of Stay: 20.051% Long Length of Stay: 16.118%   [place]     04/13/2022    2:37 PM  [facility]Q-12  1 a. Ability to shower/bathe Moderately limited  1 b. Ability to walk 1 block Other, Did not do  1 c. Ability to hurry/jog Other, Did not do  2. Edema feet/ankles/legs 3+ times a week, not every day  3. Limited by fatigue Several times a day  4. Limited by dyspnea Several times a day  5. Sitting up / on 3+ pillows Less than once a week  6. Limited enjoyment of life Limited quite a bit  7. Rest of life w/ symptoms Mostly dissatisfied  8 a. Participation in hobbies Severely limited  8 b. Participation in chores Severely limited  8 c. Visiting family/friends Limited quite a bit     Assessment and Plan:   Alphonza Feltham is a 61 y.o. male with symptoms of severe, stage D mitral regurgitation with NYHA Class III symptoms. I have reviewed the patient's recent echocardiogram which is notable  for decrease LV systolic function at [id number] with global hypokinesis, G3DD, poor MV coaptation and severe functional MR and hepatic flow reversal consistent with severe TR. TEE performed 04/13/22 showed poor coaptation with posterior leaflet restriction and severe 4+ central/posterior MR with systolic flow reversal in PVs.   I have reviewed the natural history of mitral regurgitation with the patient. We have discussed the limitations of medical therapy and the poor prognosis associated with symptomatic mitral regurgitation. We have also reviewed potential treatment options, including palliative medical therapy, conventional surgical mitral valve repair or replacement, and percutaneous mitral valve repair with MitraClip. We discussed treatment options in the context of this patient's specific comorbid medical conditions.    The patient's predicted risk of mortality with conventional mitral valve replacement/repair is 5.6 % / 3.7 % respectively,  primarily based on severe combined systolic and diastolic heart failure with dobutamine dependence. Other significant comorbid conditions acute on chronic kidney disease stage IIIa, CAD with known occluded RCA, and severe TR with hepatic flow reversal.   The patient has undergone TEER evaluation with R/LHC, echocardiogram, and TEE with results above. Imaging has been sent to industry for formal MitraClip review. Dr. Burt Knack to follow with full recommendations on proceeding versus continuation with medical therapy for progressive heart failure.      Signed, Kathyrn Drown, NP  04/13/2022 12:04 PM   Patient seen, examined. Available data reviewed. Agree with findings,  assessment, and plan as outlined by Kathyrn Drown, NP. The patient is independently interviewed and examined. He has a long hx of heart failure, and over the past 4 months has developed more advanced symptoms, currently inotrope dependent on IV dobutamine unable to wean. He has mixed ischemic and  non-ischemic cardiomyopathy with LVEF approximately 30% and has been found to have severe MR and severe TR. He has single vessel CAD with chronic RCA occlusion. The patient has been on home dobutamine with hospice, but now is hospitalized and would like to explore all potential treatment options that might improve his quality of life or longevity. In reviewing his records, his care has been complicated by a long history of medical non-compliance and limited support system.   On my exam this evening, he is alert, oriented, in NAD. HEENT is normal, lungs CTA bilaterally, JVP is mildly elevated, carotid upstrokes are normal without bruits, the heart has a diffuse apical impulse, RRR with an S3 gallop, and a 3/6 holosystolic murmur at the apex. Abdomen is soft, NT, no masses, and extremities have 1+ edema bilaterally in the ankles/pretibial regions. Distal extremities are warm and there are no skin rashes.   Echo, TEE, and cath images are personally reviewed. Cath shows severe singe vessel CAD with total occlusion of the mid-RCA. The left main, LAD, and LCx have nonobstructive disease. There is a 31 mmHg V-wave, RA pressure f 9 mmHg, mPA 39 mmHg, CI 2.3 on dobutamine 5 mcg, and PVR 4 [place] units. Echo shows a dilated LV with EDD 6.8 cm, severe LV dysfunction with LVEF 30%, and severe MR/TR. RV function is ok. TEE confirms severe MR with Carpentier Type 1 dysfunction secondary to dilated annulus and leaflet malcoaptation. There is severe but not torrential TR.   In summary, the patient has NYHA Class 4 symptoms of acute on chronic combined systolic and diastolic heart failure complicated by severe MR/TR, and inotrope dependence. His case has been reviewed by th [facility] group and he is not felt to be a candidate for advanced therapies such as LVAD or transplantation with his long hx of medical noncompliance and very limited support network. In these circumstances, it is reasonable to consider TEER to reduce MR,  increase forward flow/cardiac output, and potentially allow for weaning of inotropic therapy and titration of heart failure medications. The patient's TEE shows mitral valve anatomy that should be favorable for TEER with MitraClip, likely requiring 2-3 clips to treat very severe, broad-based MR. I have some concern about the patient's contractile reserve with his degree of LV dysfunction and I think that unloading him with IABP therapy up front may somewhat mitigate this risk. Discussed with Dr Haroldine Laws and one of [place] will pln on placing a Swan-Ganz catheter for hemodynamic optimization and IABP tomorrow in anticipation of MitraClip on Thursday. With the patient's permission, I contacted his brother who lives in [place], and discussed the situation at length with him. He plans on coming to the hospital to visit with the patient tomorrow, but it appears they have not been in close touch as he has been unaware of the extent of the patient's illness and lack of medical compliance over the years.   The patient is counseled specifically about the risks, indications, and alternatives to percutaneous mitral valve repair with MitraClip. Specific risks include vascular injury, bleeding, infection, arrhythmia, myocardial infarction, stroke, cardiac perforation, cardiac tamponade, device embolization, single leaflet detachment, endocarditis, mitral valve injury, emergency surgery, and death. They understand the risk of  serious complication occurs at a rate of approximately 2%. After this discussion, the patient provides full informed consent for surgery.    Tonny Bollman, M.D. 04/13/2022 10:07 PM

## 2022-04-13 NOTE — Telephone Encounter (Signed)
Per [employer] review of 04/13/2022 TEE,   "For patient SP: This is Secondary/Atrial Functional MR and does not require a surgical consult  This valve is suitable for a MitraClip implant. The fossa looks approachable for transseptal puncture in the SAXB and Bicaval views. LA dimensions are large enough for device steering and straddle. The defect is due to enlarged left atrium and dilated annulus. The posterior leaflet measures 1.49cm in the LVOT grasping view(s). MVA measures 5.95cm2 via planimetry & gradient measures at a HR of 80bpm. I'd like to verify both MVA and gradient in the case prior to the start. Based on this information, I would plan to use the "zip & clip" strategy to close the coaptation gap without creating leaflet tension. Minimum 2 NTW clips.     TR noted"

## 2022-04-13 NOTE — Plan of Care (Signed)
  Problem: Education: Goal: Knowledge of General Education information will improve Description: Including pain rating scale, medication(s)/side effects and non-pharmacologic comfort measures Outcome: Progressing   Problem: Clinical Measurements: Goal: Ability to maintain clinical measurements within normal limits will improve Outcome: Progressing Goal: Will remain free from infection Outcome: Progressing   

## 2022-04-13 NOTE — Progress Notes (Signed)
  Echocardiogram Echocardiogram Transesophageal has been performed.  Zachary Arias 04/13/2022, 10:18 AM

## 2022-04-13 NOTE — Anesthesia Postprocedure Evaluation (Signed)
Anesthesia Post Note  Patient: Zachary Arias  Procedure(s) Performed: TRANSESOPHAGEAL ECHOCARDIOGRAM (TEE)     Patient location during evaluation: PACU Anesthesia Type: MAC Level of consciousness: awake and alert Pain management: pain level controlled Vital Signs Assessment: post-procedure vital signs reviewed and stable Respiratory status: spontaneous breathing Cardiovascular status: stable Anesthetic complications: no   No notable events documented.  Last Vitals:  Vitals:   04/13/22 1015 04/13/22 1030  BP: 94/73 94/78  Pulse: 84 82  Resp: 19 20  Temp:    SpO2: 100% 98%    Last Pain:  Vitals:   04/13/22 0951  TempSrc: Oral  PainSc: 0-No pain                 Nolon Nations

## 2022-04-13 NOTE — Progress Notes (Signed)
Mobility Specialist: Progress Note   04/13/22 1517  Mobility  Activity Ambulated independently in hallway  Level of Assistance Independent  Assistive Device None  Distance Ambulated (ft) 470 ft  Activity Response Tolerated well  $Mobility charge 1 Mobility   Pre-Mobility: 83 HR, 100% SpO2 Post-Mobility: 84 HR, 100% SpO2  Pt received in the bed and agreeable to mobility. C/o mild SOB during ambulation, otherwise asymptomatic. Ambulated on 3 L/min [place]. Pt back to bed after session with call bell at his side.   [facility] Terrianne Cavness Mobility Specialist Mobility Specialist [address]: [phone]

## 2022-04-13 NOTE — Anesthesia Preprocedure Evaluation (Addendum)
Anesthesia Evaluation  Patient identified by MRN, date of birth, ID band Patient awake    Reviewed: Allergy & Precautions, NPO status , Patient's Chart, lab work & pertinent test results  Airway Mallampati: II  TM Distance: >3 FB Neck ROM: Full    Dental no notable dental hx. (+) Teeth Intact, Dental Advisory Given   Pulmonary neg pulmonary ROS,    Pulmonary exam normal breath sounds clear to auscultation       Cardiovascular hypertension, Pt. on medications + CAD and +CHF   Rhythm:Regular Rate:Normal + Systolic murmurs Echo 11/2200 1. Left ventricular ejection fraction, by estimation, is 30 to 35%. The left ventricle has moderate to severely decreased function. The left ventricle demonstrates global hypokinesis. The left ventricular internal cavity size was moderately to severely dilated. There is mild left ventricular hypertrophy. Left ventricular diastolic parameters are consistent with Grade III diastolic dysfunction (restrictive). Elevated left atrial pressure.  2. Right ventricular systolic function is normal. The right ventricular size is mildly enlarged. There is severely elevated pulmonary artery systolic pressure.  3. Left atrial size was severely dilated.  4. Right atrial size was severely dilated.  5. Dilated ventricle leading to poor MV coaptation and severe functional MR. The mitral valve is abnormal. Severe mitral valve regurgitation. No evidence of mitral stenosis.  6. Hepatic systolic flow reversal consistent with severe TR. The tricuspid valve is abnormal. Tricuspid valve regurgitation is severe.  7. The aortic valve has an indeterminant number of cusps. Aortic valve regurgitation is not visualized. No aortic stenosis is present.  8. The inferior vena cava is dilated in size with <50% respiratory variability, suggesting right atrial pressure of 15 mmHg.    Neuro/Psych negative neurological ROS      GI/Hepatic negative GI ROS, Neg liver ROS,   Endo/Other  negative endocrine ROS  Renal/GU Renal disease     Musculoskeletal negative musculoskeletal ROS (+)   Abdominal   Peds  Hematology negative hematology ROS (+)   Anesthesia Other Findings   Reproductive/Obstetrics                            Anesthesia Physical Anesthesia Plan  ASA: 4  Anesthesia Plan: MAC   Post-op Pain Management: Minimal or no pain anticipated   Induction:   PONV Risk Score and Plan: 1 and Propofol infusion, TIVA and Treatment may vary due to age or medical condition  Airway Management Planned:   Additional Equipment:   Intra-op Plan:   Post-operative Plan:   Informed Consent: I have reviewed the patients History and Physical, chart, labs and discussed the procedure including the risks, benefits and alternatives for the proposed anesthesia with the patient or authorized representative who has indicated his/her understanding and acceptance.   Patient has DNR.  Discussed DNR with patient and Suspend DNR.   Dental advisory given  Plan Discussed with: CRNA  Anesthesia Plan Comments:        Anesthesia Quick Evaluation

## 2022-04-13 NOTE — Transfer of Care (Signed)
Immediate Anesthesia Transfer of Care Note  Patient: Zachary Arias  Procedure(s) Performed: TRANSESOPHAGEAL ECHOCARDIOGRAM (TEE)  Patient Location: Endoscopy Unit  Anesthesia Type:MAC  Level of Consciousness: awake and alert   Airway & Oxygen Therapy: Patient Spontanous Breathing and Patient connected to nasal cannula oxygen  Post-op Assessment: Report given to RN and Post -op Vital signs reviewed and stable  Post vital signs: Reviewed and stable  Last Vitals:  Vitals Value Taken Time  BP 97/66 04/13/22 0914  Temp    Pulse 73 04/13/22 0915  Resp 21 04/13/22 0915  SpO2 90 % 04/13/22 0915  Vitals shown include unvalidated device data.  Last Pain:  Vitals:   04/13/22 0807  TempSrc: Oral  PainSc: 0-No pain         Complications: No notable events documented.

## 2022-04-13 NOTE — Progress Notes (Addendum)
Advanced Heart Failure Rounding Note  PCP-Cardiologist: Dr. Haroldine Laws   Subjective:    Remains on DBA at 5. Admit co-ox ok . No repeat CO-OX. Ordered  Echo 04/10/22 EF 25-30% Severe MR/TR. Moderate RV HK   7/17 RHC/LHC - on DBA 5. Stable 1V CAD, moderate mixed pulmonary HTN, moderately reduced CO on DBA 5, prominent v waves in PCWP tracing suggestive of significant MR   SOB over night. Doesn't feel as good today.   Objective:   Weight Range: 63.6 kg Body mass index is 21.32 kg/m.   Vital Signs:   Temp:  [97.6 F (36.4 C)-99.9 F (37.7 C)] 97.9 F (36.6 C) (07/18 0413) Pulse Rate:  [82-95] 86 (07/18 0413) Resp:  [13-20] 18 (07/18 0413) BP: (100-139)/(66-83) 105/71 (07/18 0413) SpO2:  [85 %-100 %] 100 % (07/18 0413) Weight:  [63.6 kg] 63.6 kg (07/18 0413) Last BM Date : 04/09/22  Weight change: Filed Weights   04/11/22 0410 04/12/22 0502 04/13/22 0413  Weight: 64.7 kg 63.4 kg 63.6 kg    Intake/Output:   Intake/Output Summary (Last 24 hours) at 04/13/2022 0732 Last data filed at 04/13/2022 0616 Gross per 24 hour  Intake 369.51 ml  Output 1175 ml  Net -805.49 ml     CVP 20 Physical Exam  General:  No resp difficulty HEENT: normal Neck: supple. JVP to jaw . Carotids 2+ bilat; no bruits. No lymphadenopathy or thryomegaly appreciated. Cor: PMI nondisplaced. Regular rate & rhythm. No rubs, gallops. 2/6 MR/TR. Lungs: clear Abdomen: soft, nontender, nondistended. No hepatosplenomegaly. No bruits or masses. Good bowel sounds. Extremities: no cyanosis, clubbing, rash, edema. RUE PICC Neuro: alert & orientedx3, cranial nerves grossly intact. moves all 4 extremities w/o difficulty. Affect flat   Telemetry   SR 80-90s personally checked.   Labs    CBC Recent Labs    04/12/22 0450 04/12/22 1350 04/12/22 1352  WBC 12.7*  --   --   HGB 12.0* 11.9*  12.6* 11.9*  HCT 36.6* 35.0*  37.0* 35.0*  MCV 85.7  --   --   PLT 196  --   --    Basic Metabolic  Panel Recent Labs    04/12/22 0450 04/12/22 1350 04/12/22 1352 04/13/22 0421  NA 144   < > 150* 142  K 4.2   < > 3.7 4.4  CL 112*  --   --  112*  CO2 24  --   --  25  GLUCOSE 130*  --   --  255*  BUN 33*  --   --  35*  CREATININE 1.49*  --   --  1.52*  CALCIUM 8.7*  --   --  8.8*  MG 2.6*  --   --  2.6*  PHOS  --   --   --  3.8   < > = values in this interval not displayed.   Liver Function Tests No results for input(s): "AST", "ALT", "ALKPHOS", "BILITOT", "PROT", "ALBUMIN" in the last 72 hours. No results for input(s): "LIPASE", "AMYLASE" in the last 72 hours. Cardiac Enzymes No results for input(s): "CKTOTAL", "CKMB", "CKMBINDEX", "TROPONINI" in the last 72 hours.  BNP: BNP (last 3 results) Recent Labs    02/12/22 1432 03/09/22 1540 04/08/22 2055  BNP 562.1* 503.3* 3,236.0*    ProBNP (last 3 results) No results for input(s): "PROBNP" in the last 8760 hours.   D-Dimer No results for input(s): "DDIMER" in the last 72 hours. Hemoglobin A1C No results for input(s): "HGBA1C"  in the last 72 hours. Fasting Lipid Panel No results for input(s): "CHOL", "HDL", "LDLCALC", "TRIG", "CHOLHDL", "LDLDIRECT" in the last 72 hours. Thyroid Function Tests No results for input(s): "TSH", "T4TOTAL", "T3FREE", "THYROIDAB" in the last 72 hours.  Invalid input(s): "FREET3"  Other results:   Imaging    CARDIAC CATHETERIZATION  Result Date: 04/12/2022   Mid LAD lesion is 40% stenosed.   Mid Cx lesion is 30% stenosed.   Prox RCA lesion is 50% stenosed.   Dist RCA lesion is 100% stenosed.   1st Mrg lesion is 30% stenosed. Findings: On DBA 5 Ao = 89/68 (79) LV = 93/18 RA = 9 RV = 67/15 PA = 65/26 (39) PCW = 23 (v = 31) Fick cardiac output/index = 4.1/2.3 PVR = 3.9 WU FA sat = 98% PA sat = 64%, 65% PAPi = 4.3 Assessment: 1. Stable 1v CAD 2. Moderate mixed pulmonary HTN 3. Moderately reduced CO on DBA 5 4. Prominent v waves in PCWP tracing suggestive of significant MR Plan/Discussion:  Plan TEE to further evaluate. Suspect he will need advanced therapies. Glori Bickers, MD 2:20 PM     Medications:     Scheduled Medications:  amiodarone  200 mg Oral BID   aspirin EC  81 mg Oral Daily   atorvastatin  40 mg Oral Daily   Chlorhexidine Gluconate Cloth  6 each Topical Daily   dapagliflozin propanediol  10 mg Oral Daily   digoxin  0.0625 mg Oral Daily   enoxaparin (LOVENOX) injection  40 mg Subcutaneous Q24H   sodium chloride flush  10-40 mL Intracatheter Q12H   sodium chloride flush  3 mL Intravenous Q12H   sodium chloride flush  3 mL Intravenous Q12H   spironolactone  12.5 mg Oral Daily    Infusions:  sodium chloride     DOBUTamine 5 mcg/kg/min (04/13/22 0616)    PRN Medications: sodium chloride, acetaminophen **OR** acetaminophen, LORazepam, ondansetron **OR** ondansetron (ZOFRAN) IV, polyethylene glycol, sodium chloride flush, sodium chloride flush    Patient Profile   61 y.o. 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 Cath with 1-v CAD with chronically occluded RCA. MRI in January 2011 with EF 31% with inferior scar. Recent admit 4/23 for a/c CHF w/ low output, requiring DBA. He is end-stage and inotrope dependent, on home DBA. Not a good candidate for advanced therapies with lack of support system, and non-citizen status and does not want this. Has been followed by home hospice.   Presented to ED w/ CC of progressive dyspnea and increased supp O2 requirements.   BNP 3,236. CXR w/ vascular congestion.    Assessment/Plan   1. Acute on Chronic Systolic Heart Failure - Cath with 1-v CAD with chronically occluded RCA. MRI in January 2011 with EF 31% with inferior scar.  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   - Echo (8/13): EF 45-50% .  - No f/u between to 2015-2021 - Echo (6/21): EF 20-25% severe central MR. Repeat cath 6/21 with  stable CAD - Echo (4/22): EF 40-45% mild to mod MR RV mild HK  - Echo (4/23): EF 30-35%.   - Admit 4/23 with shock, started on DBA and lasix. Diuresed 20 lbs. Unable to wean DBA - He is end-stage and inotrope dependent. - On Home DBA 5 mcg/kg/min via single lumen PICC. Under hospice care - Admitted with a/c HF. Diuresed with IV lasix - Echo 04/10/22 EF  25-30% Severe MR/TR. Moderate RV HK  - CVP 20 today. Volume status trending up. Give 80 mg IV lasix.  - Continue Farxiga 10 mg daily  - Continue digoxin 0.0625 mg daily.  - Continue spiro 12.5 mg daily. - renal function stable.  - Long discussion with him again on 7/15 regarding options. He is interested in advanced therapies now. We discussed previous barriers: lack of insurance, non-compliance and lack of supportive caregiver. I met with SW. He now has insurance to cover. We discussed pros/cons transplant vs VAD. Echo with EF 25-30% on DBA with severe MR/TR.  - RHC/LHC with stable 1V disease, moderate mixed pulmonary HTN, moderately reduced CO on DBA 5, and prominent v waves in PCWP tracing suggestive of significant MR - TEE today   2. AKI on CKD Stage IIIa likely due to ATN/overdiuresis - Creatinine baseline 1.6 - 1.82 on admit>>1.5  today  - follow BMP    3. PVCs - Continue amio 200 mg bid. - K and Mag stable.   4. CAD  - Cath with 1-v CAD with chronically occluded RCA - Repeat LHC with stable 1V CAD.  - No s/s angina - Continue ASA and atorvastatin.   5. Transaminitis - suspect due to passive congestion vs low output (co-ox ok)  6. Severe MR/TR - ? mTEER candidate to forestall need for advanced therapies  -  TEE today.  Dr Haroldine Laws discussing with structural heart team.    Length of Stay: Hooper, NP  04/13/2022, 7:32 AM  Advanced Heart Failure Team Pager [phone] (M-F; 7a - 5p)  Please contact [facility] for night-coverage after hours (5p -7a ) and weekends on [email or web address]  Patient seen and examined with the  above-signed Advanced Practice Provider and/or Housestaff. I personally reviewed laboratory data, imaging studies and relevant notes. I independently examined the patient and formulated the important aspects of the plan. I have edited the note to reflect any of my changes or salient points. I have personally discussed the plan with the patient and/or family.  Cath yesterday with stable 1v cad. Hemodynamics suggestive of significant MR  Remains on DBA 5. Feels weak. SOB today Co-ox 63%. CVP 15-20  General:  Weak appearing. No resp difficulty HEENT: normal Neck: supple. JVP to ear  Carotids 2+ bilat; no bruits. No lymphadenopathy or thryomegaly appreciated. Cor: PMI nondisplaced. Regular rate & rhythm. 2/6 MR/TR Lungs: clear Abdomen: soft, nontender, nondistended. No hepatosplenomegaly. No bruits or masses. Good bowel sounds. Extremities: no cyanosis, clubbing, rash, edema Neuro: alert & orientedx3, cranial nerves grossly intact. moves all 4 extremities w/o difficulty. Affect pleasant  Remains tenuous. He has been presented at [facility] and felt not to be a transplant candidate due to issues with transportation, noncompliance and lack of caregiver support. Also felt to be poor VAD candidate for similar reasons.   Will plan TEE today to see if we can stabilize with mTEER.   Diurese with IV lasix.   Glori Bickers, MD  8:21 AM

## 2022-04-13 NOTE — CV Procedure (Addendum)
   TRANSESOPHAGEAL ECHOCARDIOGRAM   NAME:  Zachary Arias   MRN: [id number] DOB:  [birth year]   ADMIT DATE: 04/08/2022  INDICATIONS:  Mitral regurgitation  PROCEDURE:   Informed consent was obtained prior to the procedure. The risks, benefits and alternatives for the procedure were discussed and the patient comprehended these risks.  Risks include, but are not limited to, cough, sore throat, vomiting, nausea, somnolence, esophageal and stomach trauma or perforation, bleeding, low blood pressure, aspiration, pneumonia, infection, trauma to the teeth and death.    After a procedural time-out, the oropharynx was anesthetized and the patient was sedated by the anesthesia service. The transesophageal probe was inserted in the esophagus and stomach without difficulty and multiple views were obtained.   FINDINGS:  LEFT VENTRICLE: EF = Moderately to severely dilated. EF 25% global Hl.   RIGHT VENTRICLE: Moderate HK  LEFT ATRIUM: Severely dilated  LEFT ATRIAL APPENDAGE: No clot  RIGHT ATRIUM: Moderately dilated  AORTIC VALVE:  Trileaflet. Trivial AI  MITRAL VALVE:    Poor coaptation. Posterior leaflet restricted. Severe 4+ central/posterior MR. + systolic flow reversal in PVs  TRICUSPID VALVE: Moderate TR  PULMONIC VALVE: Normal  INTERATRIAL SEPTUM: No PFO/ASD  PERICARDIUM: Normal  DESCENDING AORTA: Mild plaque  Severe MR due to LVl/LA dilation. Suspect may be mTEER candidate. Will review with structural team.   Arvilla Meres, MD  9:15 AM

## 2022-04-14 ENCOUNTER — Inpatient Hospital Stay (HOSPITAL_COMMUNITY): Payer: Medicaid Other

## 2022-04-14 ENCOUNTER — Telehealth (HOSPITAL_COMMUNITY): Payer: Self-pay | Admitting: *Deleted

## 2022-04-14 ENCOUNTER — Encounter (HOSPITAL_COMMUNITY)
Admission: EM | Disposition: A | Discharge status: Home / Self Care | Payer: Self-pay | Source: Home / Self Care | Attending: Internal Medicine

## 2022-04-14 DIAGNOSIS — N179 Acute kidney failure, unspecified: Secondary | ICD-10-CM | POA: Diagnosis not present

## 2022-04-14 DIAGNOSIS — I493 Ventricular premature depolarization: Secondary | ICD-10-CM | POA: Diagnosis not present

## 2022-04-14 DIAGNOSIS — I2510 Atherosclerotic heart disease of native coronary artery without angina pectoris: Secondary | ICD-10-CM | POA: Diagnosis not present

## 2022-04-14 DIAGNOSIS — I5023 Acute on chronic systolic (congestive) heart failure: Secondary | ICD-10-CM | POA: Diagnosis not present

## 2022-04-14 HISTORY — PX: RIGHT HEART CATH: CATH118263

## 2022-04-14 HISTORY — PX: IABP INSERTION: CATH118242

## 2022-04-14 LAB — POCT I-STAT EG7
Acid-Base Excess: 0 mmol/L (ref 0.0–2.0)
Acid-base deficit: 1 mmol/L (ref 0.0–2.0)
Acid-base deficit: 4 mmol/L — ABNORMAL HIGH (ref 0.0–2.0)
Bicarbonate: 22.7 mmol/L (ref 20.0–28.0)
Bicarbonate: 25.5 mmol/L (ref 20.0–28.0)
Bicarbonate: 20.4 mmol/L (ref 20.0–28.0)
Calcium, Ion: 1.11 mmol/L — ABNORMAL LOW (ref 1.15–1.40)
Calcium, Ion: 1.15 mmol/L (ref 1.15–1.40)
Calcium, Ion: 0.72 mmol/L — CL (ref 1.15–1.40)
HCT: 35 % — ABNORMAL LOW (ref 39.0–52.0)
HCT: 36 % — ABNORMAL LOW (ref 39.0–52.0)
HCT: 28 % — ABNORMAL LOW (ref 39.0–52.0)
Hemoglobin: 11.9 g/dL — ABNORMAL LOW (ref 13.0–17.0)
Hemoglobin: 12.2 g/dL — ABNORMAL LOW (ref 13.0–17.0)
Hemoglobin: 9.5 g/dL — ABNORMAL LOW (ref 13.0–17.0)
O2 Saturation: 55 %
O2 Saturation: 97 %
O2 Saturation: 58 %
Potassium: 3.6 mmol/L (ref 3.5–5.1)
Potassium: 3.6 mmol/L (ref 3.5–5.1)
Potassium: 2.5 mmol/L — CL (ref 3.5–5.1)
Sodium: 149 mmol/L — ABNORMAL HIGH (ref 135–145)
Sodium: 149 mmol/L — ABNORMAL HIGH (ref 135–145)
Sodium: 158 mmol/L — ABNORMAL HIGH (ref 135–145)
TCO2: 24 mmol/L (ref 22–32)
TCO2: 27 mmol/L (ref 22–32)
TCO2: 21 mmol/L — ABNORMAL LOW (ref 22–32)
pCO2, Ven: 35 mmHg — ABNORMAL LOW (ref 44–60)
pCO2, Ven: 42.6 mmHg — ABNORMAL LOW (ref 44–60)
pCO2, Ven: 34.8 mmHg — ABNORMAL LOW (ref 44–60)
pH, Ven: 7.385 (ref 7.25–7.43)
pH, Ven: 7.42 (ref 7.25–7.43)
pH, Ven: 7.377 (ref 7.25–7.43)
pO2, Ven: 29 mmHg — CL (ref 32–45)
pO2, Ven: 92 mmHg — ABNORMAL HIGH (ref 32–45)
pO2, Ven: 31 mmHg — CL (ref 32–45)

## 2022-04-14 LAB — BASIC METABOLIC PANEL
Anion gap: 5 (ref 5–15)
BUN: 38 mg/dL — ABNORMAL HIGH (ref 6–20)
CO2: 27 mmol/L (ref 22–32)
Calcium: 8.6 mg/dL — ABNORMAL LOW (ref 8.9–10.3)
Chloride: 112 mmol/L — ABNORMAL HIGH (ref 98–111)
Creatinine, Ser: 1.73 mg/dL — ABNORMAL HIGH (ref 0.61–1.24)
GFR, Estimated: 45 mL/min — ABNORMAL LOW (ref >60)
Glucose, Bld: 197 mg/dL — ABNORMAL HIGH (ref 70–99)
Potassium: 3.9 mmol/L (ref 3.5–5.1)
Sodium: 144 mmol/L (ref 135–145)

## 2022-04-14 LAB — TYPE AND SCREEN
ABO/RH(D): O POS
Antibody Screen: NEGATIVE

## 2022-04-14 LAB — MRSA NEXT GEN BY PCR, NASAL: MRSA by PCR Next Gen: NOT DETECTED

## 2022-04-14 LAB — HEPARIN LEVEL (UNFRACTIONATED): Heparin Unfractionated: 0.24 IU/mL — ABNORMAL LOW (ref 0.30–0.70)

## 2022-04-14 LAB — COOXEMETRY PANEL
Carboxyhemoglobin: 0.9 % (ref 0.5–1.5)
Methemoglobin: 1 % (ref 0.0–1.5)
O2 Saturation: 61.5 %
Total hemoglobin: 12.1 g/dL (ref 12.0–16.0)

## 2022-04-14 LAB — ABO/RH: ABO/RH(D): O POS

## 2022-04-14 LAB — LIPOPROTEIN A (LPA): Lipoprotein (a): 222.6 nmol/L — ABNORMAL HIGH (ref <75.0)

## 2022-04-14 SURGERY — IABP INSERTION
Anesthesia: LOCAL

## 2022-04-14 MED ORDER — FUROSEMIDE 10 MG/ML IJ SOLN
80.0000 mg | Freq: Once | INTRAMUSCULAR | Status: AC
  Administered 2022-04-14: 80 mg via INTRAVENOUS
  Filled 2022-04-14: qty 8

## 2022-04-14 MED ORDER — HEPARIN 30,000 UNITS/1000 ML (OHS) CELLSAVER SOLUTION
Status: DC
  Filled 2022-04-14: qty 1000

## 2022-04-14 MED ORDER — HEPARIN SODIUM (PORCINE) 1000 UNIT/ML IJ SOLN
INTRAMUSCULAR | Status: AC
  Filled 2022-04-14: qty 10

## 2022-04-14 MED ORDER — FENTANYL CITRATE (PF) 100 MCG/2ML IJ SOLN
INTRAMUSCULAR | Status: DC | PRN
  Administered 2022-04-14: 25 ug via INTRAVENOUS

## 2022-04-14 MED ORDER — CHLORHEXIDINE GLUCONATE 0.12 % MT SOLN
15.0000 mL | Freq: Once | OROMUCOSAL | Status: AC
  Administered 2022-04-15: 15 mL via OROMUCOSAL
  Filled 2022-04-14: qty 15

## 2022-04-14 MED ORDER — SODIUM CHLORIDE 0.9 % IV SOLN: INTRAVENOUS | Status: DC | PRN

## 2022-04-14 MED ORDER — FUROSEMIDE 10 MG/ML IJ SOLN: 80.0000 mg | Freq: Once | INTRAMUSCULAR | Status: DC

## 2022-04-14 MED ORDER — CHLORHEXIDINE GLUCONATE CLOTH 2 % EX PADS
6.0000 | MEDICATED_PAD | Freq: Once | CUTANEOUS | Status: AC
  Administered 2022-04-15: 6 via TOPICAL

## 2022-04-14 MED ORDER — BISACODYL 5 MG PO TBEC: 5.0000 mg | DELAYED_RELEASE_TABLET | Freq: Once | ORAL | Status: DC

## 2022-04-14 MED ORDER — FENTANYL CITRATE (PF) 100 MCG/2ML IJ SOLN
INTRAMUSCULAR | Status: AC
  Filled 2022-04-14: qty 2

## 2022-04-14 MED ORDER — HEPARIN (PORCINE) 25000 UT/250ML-% IV SOLN
900.0000 [IU]/h | INTRAVENOUS | Status: DC
  Administered 2022-04-14: 900 [IU]/h via INTRAVENOUS
  Filled 2022-04-14: qty 250

## 2022-04-14 MED ORDER — TRANEXAMIC ACID (OHS) BOLUS VIA INFUSION
15.0000 mg/kg | INTRAVENOUS | Status: DC
  Filled 2022-04-14: qty 951

## 2022-04-14 MED ORDER — HEPARIN (PORCINE) IN NACL 1000-0.9 UT/500ML-% IV SOLN
INTRAVENOUS | Status: AC
  Filled 2022-04-14: qty 500

## 2022-04-14 MED ORDER — SODIUM CHLORIDE 0.9% FLUSH: 3.0000 mL | Freq: Two times a day (BID) | INTRAVENOUS | Status: DC

## 2022-04-14 MED ORDER — HEPARIN (PORCINE) IN NACL 2000-0.9 UNIT/L-% IV SOLN
INTRAVENOUS | Status: AC
  Filled 2022-04-14: qty 1000

## 2022-04-14 MED ORDER — LIDOCAINE HCL (PF) 1 % IJ SOLN
INTRAMUSCULAR | Status: AC
  Filled 2022-04-14: qty 30

## 2022-04-14 MED ORDER — INSULIN REGULAR(HUMAN) IN NACL 100-0.9 UT/100ML-% IV SOLN
INTRAVENOUS | Status: DC
  Filled 2022-04-14: qty 100

## 2022-04-14 MED ORDER — MAGNESIUM SULFATE 50 % IJ SOLN
40.0000 meq | INTRAMUSCULAR | Status: DC
  Filled 2022-04-14: qty 9.85

## 2022-04-14 MED ORDER — POTASSIUM CHLORIDE 2 MEQ/ML IV SOLN
80.0000 meq | INTRAVENOUS | Status: DC
  Filled 2022-04-14: qty 40

## 2022-04-14 MED ORDER — MIDAZOLAM HCL 2 MG/2ML IJ SOLN
INTRAMUSCULAR | Status: DC | PRN
  Administered 2022-04-14: 1 mg via INTRAVENOUS

## 2022-04-14 MED ORDER — CEFAZOLIN SODIUM-DEXTROSE 2-4 GM/100ML-% IV SOLN
2.0000 g | INTRAVENOUS | Status: AC
  Administered 2022-04-15: 2 g via INTRAVENOUS

## 2022-04-14 MED ORDER — DOBUTAMINE IN D5W 4-5 MG/ML-% IV SOLN: 2.5000 ug/kg/min | INTRAVENOUS | Status: DC

## 2022-04-14 MED ORDER — NITROGLYCERIN IN D5W 200-5 MCG/ML-% IV SOLN
2.0000 ug/min | INTRAVENOUS | Status: DC
  Filled 2022-04-14: qty 250

## 2022-04-14 MED ORDER — CEFAZOLIN SODIUM-DEXTROSE 2-4 GM/100ML-% IV SOLN
2.0000 g | INTRAVENOUS | Status: DC
  Filled 2022-04-14: qty 100

## 2022-04-14 MED ORDER — TRANEXAMIC ACID (OHS) PUMP PRIME SOLUTION
2.0000 mg/kg | INTRAVENOUS | Status: DC
  Filled 2022-04-14: qty 1.27

## 2022-04-14 MED ORDER — MILRINONE LACTATE IN DEXTROSE 20-5 MG/100ML-% IV SOLN
0.1250 ug/kg/min | INTRAVENOUS | Status: DC
  Administered 2022-04-14 – 2022-04-15 (×2): 0.375 ug/kg/min via INTRAVENOUS
  Administered 2022-04-15 – 2022-04-16 (×2): 0.25 ug/kg/min via INTRAVENOUS
  Administered 2022-04-17: 0.125 ug/kg/min via INTRAVENOUS
  Filled 2022-04-14 (×2): qty 100
  Filled 2022-04-14: qty 200
  Filled 2022-04-14: qty 100

## 2022-04-14 MED ORDER — MIDAZOLAM HCL 2 MG/2ML IJ SOLN
INTRAMUSCULAR | Status: AC
  Filled 2022-04-14: qty 2

## 2022-04-14 MED ORDER — LIDOCAINE HCL (PF) 1 % IJ SOLN
INTRAMUSCULAR | Status: DC | PRN
  Administered 2022-04-14: 2 mL
  Administered 2022-04-14: 15 mL

## 2022-04-14 MED ORDER — ASPIRIN 81 MG PO CHEW: 81.0000 mg | CHEWABLE_TABLET | ORAL | Status: DC

## 2022-04-14 MED ORDER — SODIUM CHLORIDE 0.9 % IV SOLN: 250.0000 mL | INTRAVENOUS | Status: DC | PRN

## 2022-04-14 MED ORDER — NOREPINEPHRINE 4 MG/250ML-% IV SOLN
0.0000 ug/min | INTRAVENOUS | Status: DC
  Filled 2022-04-14: qty 250

## 2022-04-14 MED ORDER — EPINEPHRINE HCL 5 MG/250ML IV SOLN IN NS
0.0000 ug/min | INTRAVENOUS | Status: DC
  Filled 2022-04-14: qty 250

## 2022-04-14 MED ORDER — PLASMA-LYTE A IV SOLN
INTRAVENOUS | Status: DC
  Filled 2022-04-14: qty 2.5

## 2022-04-14 MED ORDER — PHENYLEPHRINE HCL-NACL 20-0.9 MG/250ML-% IV SOLN
30.0000 ug/min | INTRAVENOUS | Status: DC
  Filled 2022-04-14: qty 250

## 2022-04-14 MED ORDER — TRANEXAMIC ACID 1000 MG/10ML IV SOLN
1.5000 mg/kg/h | INTRAVENOUS | Status: DC
  Filled 2022-04-14: qty 25

## 2022-04-14 MED ORDER — DEXMEDETOMIDINE HCL IN NACL 400 MCG/100ML IV SOLN
0.1000 ug/kg/h | INTRAVENOUS | Status: DC
  Filled 2022-04-14: qty 100

## 2022-04-14 MED ORDER — SODIUM CHLORIDE 0.9 % IV SOLN: INTRAVENOUS | Status: DC

## 2022-04-14 MED ORDER — TEMAZEPAM 15 MG PO CAPS: 15.0000 mg | ORAL_CAPSULE | Freq: Once | ORAL | Status: DC | PRN

## 2022-04-14 MED ORDER — SODIUM CHLORIDE 0.9% FLUSH: 3.0000 mL | INTRAVENOUS | Status: DC | PRN

## 2022-04-14 MED ORDER — VANCOMYCIN HCL 1250 MG/250ML IV SOLN
1250.0000 mg | INTRAVENOUS | Status: DC
  Filled 2022-04-14: qty 250

## 2022-04-14 MED ORDER — HEPARIN SODIUM (PORCINE) 1000 UNIT/ML IJ SOLN
INTRAMUSCULAR | Status: DC | PRN
  Administered 2022-04-14: 5000 [IU] via INTRAVENOUS

## 2022-04-14 MED ORDER — HEPARIN (PORCINE) IN NACL 1000-0.9 UT/500ML-% IV SOLN
INTRAVENOUS | Status: DC | PRN
  Administered 2022-04-14: 500 mL

## 2022-04-14 MED ORDER — MILRINONE LACTATE IN DEXTROSE 20-5 MG/100ML-% IV SOLN
0.3000 ug/kg/min | INTRAVENOUS | Status: DC
  Filled 2022-04-14: qty 100

## 2022-04-14 SURGICAL SUPPLY — 14 items
BALLN IABP SENSA PLUS 8F 50CC (BALLOONS) ×2
BALLOON IABP SENS PLUS 8F 50CC (BALLOONS) IMPLANT
CATH SWAN GANZ 7F STRAIGHT (CATHETERS) ×1 IMPLANT
KIT MICROPUNCTURE NIT STIFF (SHEATH) ×1 IMPLANT
MAT PREVALON FULL STRYKER (MISCELLANEOUS) ×1 IMPLANT
PACK CARDIAC CATHETERIZATION (CUSTOM PROCEDURE TRAY) ×2 IMPLANT
SHEATH PINNACLE 7F 10CM (SHEATH) ×1 IMPLANT
SHEATH PINNACLE 8F 10CM (SHEATH) IMPLANT
SHEATH PINNACLE 9F 10CM (SHEATH) ×1 IMPLANT
SHEATH PROBE COVER 6X72 (BAG) ×1 IMPLANT
SLEEVE REPOSITIONING LENGTH 30 (MISCELLANEOUS) ×1 IMPLANT
TRANSDUCER W/STOPCOCK (MISCELLANEOUS) ×2 IMPLANT
TUBING ART PRESS 72  MALE/FEM (TUBING) ×1
TUBING ART PRESS 72 MALE/FEM (TUBING) IMPLANT

## 2022-04-14 NOTE — Telephone Encounter (Signed)
Pts family called stating they were supposed to call today to speak to Dr.Bensimhon.  Call back #[phone]  Message given to clinic nurse

## 2022-04-14 NOTE — Progress Notes (Signed)
Advanced Heart Failure Rounding Note  PCP-Cardiologist: Dr. Haroldine Laws   Subjective:    Remains on DBA at 5. Admit co-ox ok . No repeat CO-OX. Ordered  Echo 04/10/22 EF 25-30% Severe MR/TR. Moderate RV HK   7/17 RHC/LHC - on DBA 5. Stable 1V CAD, moderate mixed pulmonary HTN, moderately reduced CO on DBA 5, prominent v waves in PCWP tracing suggestive of significant MR  TEE 7/19  LVEF 25% severe central MR. RV moderately down.   Seen by structural team. Plan for mTEER on Thursday. Will need swan/IABP today for optimization.   Remains on DBA 5. Feels ok. Weak but denies SOB   Objective:   Weight Range: 63.4 kg Body mass index is 21.25 kg/m.   Vital Signs:   Temp:  [97.5 F (36.4 C)-98.8 F (37.1 C)] 97.5 F (36.4 C) (07/19 0417) Pulse Rate:  [72-84] 79 (07/19 0417) Resp:  [15-20] 19 (07/19 0417) BP: (90-100)/(68-78) 96/71 (07/19 0417) SpO2:  [91 %-100 %] 99 % (07/19 0417) Weight:  [63.4 kg] 63.4 kg (07/19 0417) Last BM Date : 04/11/22  Weight change: Filed Weights   04/13/22 0413 04/13/22 0807 04/14/22 0417  Weight: 63.6 kg 63.6 kg 63.4 kg    Intake/Output:   Intake/Output Summary (Last 24 hours) at 04/14/2022 0835 Last data filed at 04/14/2022 0300 Gross per 24 hour  Intake 863.71 ml  Output 1700 ml  Net -836.29 ml       Physical Exam   General:  Sitting on side of bed  No resp difficulty HEENT: normal Neck: supple. JVP to jaw. Carotids 2+ bilat; no bruits. No lymphadenopathy or thryomegaly appreciated. Cor: PMI nondisplaced. Regular rate & rhythm. + s3 2/6 MR Lungs: clear Abdomen: soft, nontender, nondistended. No hepatosplenomegaly. No bruits or masses. Good bowel sounds. Extremities: no cyanosis, clubbing, rash, edema Neuro: alert & orientedx3, cranial nerves grossly intact. moves all 4 extremities w/o difficulty. Affect pleasant   Telemetry   SR 80s Personally reviewed  Labs    CBC Recent Labs    04/12/22 0450 04/12/22 1350  04/12/22 1352  WBC 12.7*  --   --   HGB 12.0* 11.9*  12.6* 11.9*  HCT 36.6* 35.0*  37.0* 35.0*  MCV 85.7  --   --   PLT 196  --   --     Basic Metabolic Panel Recent Labs    04/12/22 0450 04/12/22 1350 04/13/22 0421 04/14/22 0634  NA 144   < > 142 144  K 4.2   < > 4.4 3.9  CL 112*  --  112* 112*  CO2 24  --  25 27  GLUCOSE 130*  --  255* 197*  BUN 33*  --  35* 38*  CREATININE 1.49*  --  1.52* 1.73*  CALCIUM 8.7*  --  8.8* 8.6*  MG 2.6*  --  2.6*  --   PHOS  --   --  3.8  --    < > = values in this interval not displayed.    Liver Function Tests No results for input(s): "AST", "ALT", "ALKPHOS", "BILITOT", "PROT", "ALBUMIN" in the last 72 hours. No results for input(s): "LIPASE", "AMYLASE" in the last 72 hours. Cardiac Enzymes No results for input(s): "CKTOTAL", "CKMB", "CKMBINDEX", "TROPONINI" in the last 72 hours.  BNP: BNP (last 3 results) Recent Labs    02/12/22 1432 03/09/22 1540 04/08/22 2055  BNP 562.1* 503.3* 3,236.0*     ProBNP (last 3 results) No results for input(s): "PROBNP" in  the last 8760 hours.   D-Dimer No results for input(s): "DDIMER" in the last 72 hours. Hemoglobin A1C No results for input(s): "HGBA1C" in the last 72 hours. Fasting Lipid Panel No results for input(s): "CHOL", "HDL", "LDLCALC", "TRIG", "CHOLHDL", "LDLDIRECT" in the last 72 hours. Thyroid Function Tests No results for input(s): "TSH", "T4TOTAL", "T3FREE", "THYROIDAB" in the last 72 hours.  Invalid input(s): "FREET3"  Other results:   Imaging    No results found.   Medications:     Scheduled Medications:  amiodarone  200 mg Oral BID   aspirin EC  81 mg Oral Daily   atorvastatin  40 mg Oral Daily   Chlorhexidine Gluconate Cloth  6 each Topical Daily   dapagliflozin propanediol  10 mg Oral Daily   digoxin  0.0625 mg Oral Daily   enoxaparin (LOVENOX) injection  40 mg Subcutaneous Q24H   sodium chloride flush  10-40 mL Intracatheter Q12H   sodium chloride  flush  3 mL Intravenous Q12H   sodium chloride flush  3 mL Intravenous Q12H   spironolactone  12.5 mg Oral Daily    Infusions:  sodium chloride     DOBUTamine 5 mcg/kg/min (04/14/22 0106)    PRN Medications: sodium chloride, acetaminophen **OR** acetaminophen, LORazepam, ondansetron **OR** ondansetron (ZOFRAN) IV, polyethylene glycol, sodium chloride flush, sodium chloride flush    Patient Profile   61 y.o. 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 Cath with 1-v CAD with chronically occluded RCA. MRI in January 2011 with EF 31% with inferior scar. Recent admit 4/23 for a/c CHF w/ low output, requiring DBA. He is end-stage and inotrope dependent, on home DBA. Not a good candidate for advanced therapies with lack of support system, and non-citizen status and does not want this. Has been followed by home hospice.   Presented to ED w/ CC of progressive dyspnea and increased supp O2 requirements.   BNP 3,236. CXR w/ vascular congestion.    Assessment/Plan   1. Acute on Chronic Systolic Heart Failure - Cath with 1-v CAD with chronically occluded RCA. MRI in January 2011 with EF 31% with inferior scar.  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   - Echo (8/13): EF 45-50% .  - No f/u between to 2015-2021 - Echo (6/21): EF 20-25% severe central MR. Repeat cath 6/21 with stable CAD - Echo (4/22): EF 40-45% mild to mod MR RV mild HK  - Echo (4/23): EF 30-35%.   - Admit 4/23 with shock, started on DBA and lasix. Diuresed 20 lbs. Unable to wean DBA - He is end-stage and inotrope dependent. - On Home DBA 5 mcg/kg/min via single lumen PICC. Under hospice care - Admitted with a/c HF. Diuresed with IV lasix - Echo 04/10/22 EF 25-30% Severe MR/TR. Moderate RV HK  - CVP 20 today. Volume status trending up. Give 80 mg IV lasix.  - Continue Farxiga 10 mg daily  - Continue  digoxin 0.0625 mg daily.  - Continue spiro 12.5 mg daily. - renal function stable.  - Long discussion with him again on 7/15 regarding options. He is interested in advanced therapies now. We discussed previous barriers: lack of insurance, non-compliance and lack of supportive caregiver. I met with SW. He now has insurance to cover. We discussed pros/cons transplant vs VAD. Echo with EF 25-30% on DBA with severe MR/TR.  - Felt not to be candidate for VAD/transplant due to issues above.  -  RHC/LHC with stable 1V disease, moderate mixed pulmonary HTN, moderately reduced CO on DBA 5, and prominent v waves in PCWP tracing suggestive of significant MR - TEE EF 25% severe MR - Given that he is nt candidate for advanced therapies, I think mTEER is only option to attempt to stabilize him and get off DBA. I d/w Dr. Burt Knack at length. Will plane swan/IABP today in an attempt to optimize pre mTEER. We discussed that if he can improve compliance and social support that he may be a transplant candidate down the road.  - I left a detailed message for his brother  2. AKI on CKD Stage IIIa likely due to ATN/overdiuresis - Creatinine baseline 1.6 - 1.82 on admit>>1.7  today  - follow BMP    3. PVCs - Continue amio 200 mg bid. - K and Mag stable.   4. CAD  - Cath this admit stable with 1-v CAD with chronically occluded RCA - No s/s angina - Continue ASA and atorvastatin.   5. Transaminitis - suspect due to passive congestion vs low output (co-ox ok)  6. Severe MR/TR - plan mTEER tomorrow as above  - swan/IABP today   Length of Stay: 6  Glori Bickers, MD  04/14/2022, 8:35 AM  Advanced Heart Failure Team Pager [phone] (M-F; 7a - 5p)  Please contact [facility] for night-coverage after hours (5p -7a ) and weekends on [email or web address]  Patient seen and examined with the above-signed Advanced Practice Provider and/or Housestaff. I personally reviewed laboratory data, imaging studies and relevant  notes. I independently examined the patient and formulated the important aspects of the plan. I have edited the note to reflect any of my changes or salient points. I have personally discussed the plan with the patient and/or family.  Cath yesterday with stable 1v cad. Hemodynamics suggestive of significant MR  Remains on DBA 5. Feels weak. SOB today Co-ox 63%. CVP 15-20  General:  Weak appearing. No resp difficulty HEENT: normal Neck: supple. JVP to ear  Carotids 2+ bilat; no bruits. No lymphadenopathy or thryomegaly appreciated. Cor: PMI nondisplaced. Regular rate & rhythm. 2/6 MR/TR Lungs: clear Abdomen: soft, nontender, nondistended. No hepatosplenomegaly. No bruits or masses. Good bowel sounds. Extremities: no cyanosis, clubbing, rash, edema Neuro: alert & orientedx3, cranial nerves grossly intact. moves all 4 extremities w/o difficulty. Affect pleasant  Remains tenuous. He has been presented at [place] and felt not to be a transplant candidate due to issues with transportation, noncompliance and lack of caregiver support. Also felt to be poor VAD candidate for similar reasons.   Will plan TEE today to see if we can stabilize with mTEER.   Diurese with IV lasix.   Glori Bickers, MD  8:35 AM

## 2022-04-14 NOTE — Progress Notes (Addendum)
   Afternoon round   IABP 1:1   On Dobutamine 5 mcg.  CVP 14 PA 45/22 (31)  CO 4.4 CI 2.3   Give 80 mg IV lasix now.   Anddy Wingert NP-C  3:41 PM    Addendum  PA 60/25 CO 2.8 CI 1.6  Stop DBA and switch to milrione 0.375 mcg.    Gwyn Hieronymus NP-C  4:52 PM

## 2022-04-14 NOTE — Progress Notes (Signed)
PROGRESS NOTE    Zachary Arias  BMW:[id number] DOB: 1960-11-01 DOA: 04/08/2022 PCP: Default, Provider, MD  60/M with history of chronic systolic CHF EF 30-35% end-stage inotrope dependent on dobutamine, CAD with PCI and stents, chronic hypoxic respiratory failure on 2 L home O2, CKD 3 a presented to the ED with dyspnea.  Has been on continuous dobutamine infusion for low output heart failure since April, also followed by hospice. -In the ED volume overloaded, started on diuretics, 7/17 underwent cardiac cath cardiac cath which noted stable coronary disease, moderately reduced cardiac output and prominent V wave and manage pressures suggesting significant MR, TEE 7/18 noted severe mitral regurgitation  -Seen by structural heart team 7/18, plan for mitral valve clip   Subjective: Feels okay, no events overnight, breathing better overall  Assessment and Plan:  Acute on chronic systolic (congestive) heart failure (HCC) Severe mitral regurgitation 04/23 echo with LV systolic function 30 to 35%, global hypokinesis, preserved RV systolic function, severe enlargement of pulmonary artery with RVSP 64,0 mmHg. Severe LA enlargement. Severe mitral regurgitation,  -Mixed cardiomyopathy, cardiac MRI 09/2009 with EF of 31%, inferior scar -Considered end-stage heart failure, inotrope dependent on home dobutamine -Not a candidate for advanced therapies, no support system, not on citizen status etc. -Right heart cath/LHC this admission noted stable one-vessel disease, moderate pulmonary hypertension, moderately reduced CO on dobutamine and severe MR, -TEE 7/18 concerning for severe MR, structural heart team consulting mitral valve clip being considered this week -Per heart failure team getting IV Lasix today, continue dobutamine, Farxiga, digoxin, Aldactone  Coronary artery disease involving native coronary artery of native heart without angina pectoris Continue with aspirin and statin.  -LHC with one-vessel  CAD, chronically occluded RCA  AKI on CKD 3a -Baseline creatinine around 1.6, creatinine close to baseline monitor  Acute on chronic respiratory failure with hypoxia (HCC) Patient with severe pulmonary hypertension. -Improving, on 2 L home O2  Frequent PVCs -Remains on amiodarone  Goals of care, counseling/discussion Followed by hospice but now considering possible advance heart failure interventions.  Code status is DNR    DVT prophylaxis: Lovenox Code Status: DNR Family Communication: Discussed with patient detail, no family at bedside Disposition Plan: To be determined  Consultants: Advanced heart failure team   Procedures: 7/17 RHC/LHC - on DBA 5. Stable 1V CAD, moderate mixed pulmonary HTN, moderately reduced CO on DBA 5, prominent v waves in PCWP tracing suggestive of significant MR   TEE 7/19  LVEF 25% severe central MR. RV moderately down.   Antimicrobials:    Objective: Vitals:   04/14/22 1051 04/14/22 1056 04/14/22 1101 04/14/22 1106  BP: 122/86 (!) 120/59 (!) 122/57 126/63  Pulse: 74 74 73 72  Resp: 14 15 15 12   Temp:      TempSrc:      SpO2: 92%     Weight:      Height:        Intake/Output Summary (Last 24 hours) at 04/14/2022 1157 Last data filed at 04/14/2022 0300 Gross per 24 hour  Intake 563.71 ml  Output 1700 ml  Net -1136.29 ml   Filed Weights   04/13/22 0413 04/13/22 0807 04/14/22 0417  Weight: 63.6 kg 63.6 kg 63.4 kg    Examination:  General exam: Chronically ill young male sitting up in bed, AAOx3, no distress   CVS: S1-S2, regular rate rhythm systolic murmur Lungs: Clear bilaterally Abdomen: Soft, nontender, bowel sounds present Extremities: No edema Skin: No rashes Psychiatry:  Mood & affect appropriate.  Data Reviewed:   CBC: Recent Labs  Lab 04/08/22 2055 04/09/22 0403 04/12/22 0450 04/12/22 1350 04/12/22 1352  WBC 9.6 9.8 12.7*  --   --   NEUTROABS 7.2 7.0  --   --   --   HGB 12.2* 12.5* 12.0* 11.9*  12.6*  11.9*  HCT 38.2* 38.2* 36.6* 35.0*  37.0* 35.0*  MCV 85.8 86.6 85.7  --   --   PLT 282 269 196  --   --    Basic Metabolic Panel: Recent Labs  Lab 04/09/22 0403 04/10/22 0340 04/11/22 0420 04/12/22 0450 04/12/22 1350 04/12/22 1352 04/13/22 0421 04/14/22 0634  NA 145 143 141 144 151*  149* 150* 142 144  K 3.8 4.3 3.6 4.2 3.7  4.0 3.7 4.4 3.9  CL 109 109 108 112*  --   --  112* 112*  CO2 23 23 23 24   --   --  25 27  GLUCOSE 115* 113* 110* 130*  --   --  255* 197*  BUN 22* 30* 32* 33*  --   --  35* 38*  CREATININE 1.65* 2.00* 1.73* 1.49*  --   --  1.52* 1.73*  CALCIUM 8.7* 8.9 8.7* 8.7*  --   --  8.8* 8.6*  MG 2.5*  --   --  2.6*  --   --  2.6*  --   PHOS  --   --   --   --   --   --  3.8  --    GFR: Estimated Creatinine Clearance: 41.2 mL/min (A) (by C-G formula based on SCr of 1.73 mg/dL (H)). Liver Function Tests: Recent Labs  Lab 04/09/22 0403  AST 94*  ALT 226*  ALKPHOS 139*  BILITOT 1.7*  PROT 5.6*  ALBUMIN 3.3*   No results for input(s): "LIPASE", "AMYLASE" in the last 168 hours. No results for input(s): "AMMONIA" in the last 168 hours. Coagulation Profile: No results for input(s): "INR", "PROTIME" in the last 168 hours. Cardiac Enzymes: No results for input(s): "CKTOTAL", "CKMB", "CKMBINDEX", "TROPONINI" in the last 168 hours. BNP (last 3 results) No results for input(s): "PROBNP" in the last 8760 hours. HbA1C: No results for input(s): "HGBA1C" in the last 72 hours. CBG: No results for input(s): "GLUCAP" in the last 168 hours. Lipid Profile: No results for input(s): "CHOL", "HDL", "LDLCALC", "TRIG", "CHOLHDL", "LDLDIRECT" in the last 72 hours. Thyroid Function Tests: No results for input(s): "TSH", "T4TOTAL", "FREET4", "T3FREE", "THYROIDAB" in the last 72 hours. Anemia Panel: No results for input(s): "VITAMINB12", "FOLATE", "FERRITIN", "TIBC", "IRON", "RETICCTPCT" in the last 72 hours. Urine analysis:    Component Value Date/Time   COLORURINE  YELLOW 03/05/2020 [place] 03/05/2020 0557   LABSPEC 1.025 03/05/2020 0557   PHURINE 5.5 03/05/2020 0557   GLUCOSEU NEGATIVE 03/05/2020 0557   HGBUR TRACE (A) 03/05/2020 0557   BILIRUBINUR NEGATIVE 03/05/2020 0557   KETONESUR NEGATIVE 03/05/2020 0557   PROTEINUR 100 (A) 03/05/2020 0557   UROBILINOGEN 0.2 06/24/2009 1116   NITRITE NEGATIVE 03/05/2020 0557   LEUKOCYTESUR NEGATIVE 03/05/2020 0557   Sepsis Labs: @LABRCNTIP (procalcitonin:4,lacticidven:4)  ) Recent Results (from the past 240 hour(s))  SARS Coronavirus 2 by RT PCR (hospital order, performed in [facility] hospital lab) *cepheid single result test* Anterior Nasal Swab     Status: None   Collection Time: 04/08/22  8:55 PM   Specimen: Anterior Nasal Swab  Result Value Ref Range Status   SARS Coronavirus 2 by RT PCR NEGATIVE NEGATIVE  Final    Comment: (NOTE) SARS-CoV-2 target nucleic acids are NOT DETECTED.  The SARS-CoV-2 RNA is generally detectable in upper and lower respiratory specimens during the acute phase of infection. The lowest concentration of SARS-CoV-2 viral copies this assay can detect is 250 copies / mL. A negative result does not preclude SARS-CoV-2 infection and should not be used as the sole basis for treatment or other patient management decisions.  A negative result may occur with improper specimen collection / handling, submission of specimen other than nasopharyngeal swab, presence of viral mutation(s) within the areas targeted by this assay, and inadequate number of viral copies (<250 copies / mL). A negative result must be combined with clinical observations, patient history, and epidemiological information.  Fact Sheet for Patients:   RoadLapTop.co.za  Fact Sheet for Healthcare Providers: http://kim-miller.com/  This test is not yet approved or  cleared by the [place] FDA and has been authorized for detection and/or diagnosis  of SARS-CoV-2 by FDA under an Emergency Use Authorization (EUA).  This EUA will remain in effect (meaning this test can be used) for the duration of the COVID-19 declaration under Section 564(b)(1) of the Act, 21 U.S.C. section 360bbb-3(b)(1), unless the authorization is terminated or revoked sooner.  Performed at [facility], [address], [place], [place] [address]      Radiology Studies: CARDIAC CATHETERIZATION  Result Date: 04/12/2022   Mid LAD lesion is 40% stenosed.   Mid Cx lesion is 30% stenosed.   Prox RCA lesion is 50% stenosed.   Dist RCA lesion is 100% stenosed.   1st Mrg lesion is 30% stenosed. Findings: On DBA 5 Ao = 89/68 (79) LV = 93/18 RA = 9 RV = 67/15 PA = 65/26 (39) PCW = 23 (v = 31) Fick cardiac output/index = 4.1/2.3 PVR = 3.9 WU FA sat = 98% PA sat = 64%, 65% PAPi = 4.3 Assessment: 1. Stable 1v CAD 2. Moderate mixed pulmonary HTN 3. Moderately reduced CO on DBA 5 4. Prominent v waves in PCWP tracing suggestive of significant MR Plan/Discussion: Plan TEE to further evaluate. Suspect he will need advanced therapies. Arvilla Meres, MD 2:20 PM     Scheduled Meds:  amiodarone  200 mg Oral BID   aspirin EC  81 mg Oral Daily   atorvastatin  40 mg Oral Daily   Chlorhexidine Gluconate Cloth  6 each Topical Daily   dapagliflozin propanediol  10 mg Oral Daily   digoxin  0.0625 mg Oral Daily   enoxaparin (LOVENOX) injection  40 mg Subcutaneous Q24H   sodium chloride flush  10-40 mL Intracatheter Q12H   sodium chloride flush  3 mL Intravenous Q12H   sodium chloride flush  3 mL Intravenous Q12H   spironolactone  12.5 mg Oral Daily   Continuous Infusions:  sodium chloride     DOBUTamine 5 mcg/kg/min (04/14/22 0106)     LOS: 6 days    Time spent:    Zannie Cove, MD [facility]   04/14/2022, 11:57 AM

## 2022-04-14 NOTE — Progress Notes (Signed)
Orthopedic Tech Progress Note Patient Details:  Zachary Arias 06/18/1961 [id number]  Ortho Devices Type of Ortho Device: Knee Immobilizer Ortho Device/Splint Interventions: Ordered     Knee immobilizer dropped off with RN. Darleen Crocker 04/14/2022, 12:02 PM

## 2022-04-14 NOTE — H&P (View-Only) (Signed)
Advanced Heart Failure Rounding Note  PCP-Cardiologist: Dr. Haroldine Laws   Subjective:    Remains on DBA at 5. Admit co-ox ok . No repeat CO-OX. Ordered  Echo 04/10/22 EF 25-30% Severe MR/TR. Moderate RV HK   7/17 RHC/LHC - on DBA 5. Stable 1V CAD, moderate mixed pulmonary HTN, moderately reduced CO on DBA 5, prominent v waves in PCWP tracing suggestive of significant MR  TEE 7/19  LVEF 25% severe central MR. RV moderately down.   Seen by structural team. Plan for mTEER on Thursday. Will need swan/IABP today for optimization.   Remains on DBA 5. Feels ok. Weak but denies SOB   Objective:   Weight Range: 63.4 kg Body mass index is 21.25 kg/m.   Vital Signs:   Temp:  [97.5 F (36.4 C)-98.8 F (37.1 C)] 97.5 F (36.4 C) (07/19 0417) Pulse Rate:  [72-84] 79 (07/19 0417) Resp:  [15-20] 19 (07/19 0417) BP: (90-100)/(68-78) 96/71 (07/19 0417) SpO2:  [91 %-100 %] 99 % (07/19 0417) Weight:  [63.4 kg] 63.4 kg (07/19 0417) Last BM Date : 04/11/22  Weight change: Filed Weights   04/13/22 0413 04/13/22 0807 04/14/22 0417  Weight: 63.6 kg 63.6 kg 63.4 kg    Intake/Output:   Intake/Output Summary (Last 24 hours) at 04/14/2022 0835 Last data filed at 04/14/2022 0300 Gross per 24 hour  Intake 863.71 ml  Output 1700 ml  Net -836.29 ml       Physical Exam   General:  Sitting on side of bed  No resp difficulty HEENT: normal Neck: supple. JVP to jaw. Carotids 2+ bilat; no bruits. No lymphadenopathy or thryomegaly appreciated. Cor: PMI nondisplaced. Regular rate & rhythm. + s3 2/6 MR Lungs: clear Abdomen: soft, nontender, nondistended. No hepatosplenomegaly. No bruits or masses. Good bowel sounds. Extremities: no cyanosis, clubbing, rash, edema Neuro: alert & orientedx3, cranial nerves grossly intact. moves all 4 extremities w/o difficulty. Affect pleasant   Telemetry   SR 80s Personally reviewed  Labs    CBC Recent Labs    04/12/22 0450 04/12/22 1350  04/12/22 1352  WBC 12.7*  --   --   HGB 12.0* 11.9*  12.6* 11.9*  HCT 36.6* 35.0*  37.0* 35.0*  MCV 85.7  --   --   PLT 196  --   --     Basic Metabolic Panel Recent Labs    04/12/22 0450 04/12/22 1350 04/13/22 0421 04/14/22 0634  NA 144   < > 142 144  K 4.2   < > 4.4 3.9  CL 112*  --  112* 112*  CO2 24  --  25 27  GLUCOSE 130*  --  255* 197*  BUN 33*  --  35* 38*  CREATININE 1.49*  --  1.52* 1.73*  CALCIUM 8.7*  --  8.8* 8.6*  MG 2.6*  --  2.6*  --   PHOS  --   --  3.8  --    < > = values in this interval not displayed.    Liver Function Tests No results for input(s): "AST", "ALT", "ALKPHOS", "BILITOT", "PROT", "ALBUMIN" in the last 72 hours. No results for input(s): "LIPASE", "AMYLASE" in the last 72 hours. Cardiac Enzymes No results for input(s): "CKTOTAL", "CKMB", "CKMBINDEX", "TROPONINI" in the last 72 hours.  BNP: BNP (last 3 results) Recent Labs    02/12/22 1432 03/09/22 1540 04/08/22 2055  BNP 562.1* 503.3* 3,236.0*     ProBNP (last 3 results) No results for input(s): "PROBNP" in  the last 8760 hours.   D-Dimer No results for input(s): "DDIMER" in the last 72 hours. Hemoglobin A1C No results for input(s): "HGBA1C" in the last 72 hours. Fasting Lipid Panel No results for input(s): "CHOL", "HDL", "LDLCALC", "TRIG", "CHOLHDL", "LDLDIRECT" in the last 72 hours. Thyroid Function Tests No results for input(s): "TSH", "T4TOTAL", "T3FREE", "THYROIDAB" in the last 72 hours.  Invalid input(s): "FREET3"  Other results:   Imaging    No results found.   Medications:     Scheduled Medications:  amiodarone  200 mg Oral BID   aspirin EC  81 mg Oral Daily   atorvastatin  40 mg Oral Daily   Chlorhexidine Gluconate Cloth  6 each Topical Daily   dapagliflozin propanediol  10 mg Oral Daily   digoxin  0.0625 mg Oral Daily   enoxaparin (LOVENOX) injection  40 mg Subcutaneous Q24H   sodium chloride flush  10-40 mL Intracatheter Q12H   sodium chloride  flush  3 mL Intravenous Q12H   sodium chloride flush  3 mL Intravenous Q12H   spironolactone  12.5 mg Oral Daily    Infusions:  sodium chloride     DOBUTamine 5 mcg/kg/min (04/14/22 0106)    PRN Medications: sodium chloride, acetaminophen **OR** acetaminophen, LORazepam, ondansetron **OR** ondansetron (ZOFRAN) IV, polyethylene glycol, sodium chloride flush, sodium chloride flush    Patient Profile   61 y.o. 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 Cath with 1-v CAD with chronically occluded RCA. MRI in January 2011 with EF 31% with inferior scar. Recent admit 4/23 for a/c CHF w/ low output, requiring DBA. He is end-stage and inotrope dependent, on home DBA. Not a good candidate for advanced therapies with lack of support system, and non-citizen status and does not want this. Has been followed by home hospice.   Presented to ED w/ CC of progressive dyspnea and increased supp O2 requirements.   BNP 3,236. CXR w/ vascular congestion.    Assessment/Plan   1. Acute on Chronic Systolic Heart Failure - Cath with 1-v CAD with chronically occluded RCA. MRI in January 2011 with EF 31% with inferior scar.  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   - Echo (8/13): EF 45-50% .  - No f/u between to 2015-2021 - Echo (6/21): EF 20-25% severe central MR. Repeat cath 6/21 with stable CAD - Echo (4/22): EF 40-45% mild to mod MR RV mild HK  - Echo (4/23): EF 30-35%.   - Admit 4/23 with shock, started on DBA and lasix. Diuresed 20 lbs. Unable to wean DBA - He is end-stage and inotrope dependent. - On Home DBA 5 mcg/kg/min via single lumen PICC. Under hospice care - Admitted with a/c HF. Diuresed with IV lasix - Echo 04/10/22 EF 25-30% Severe MR/TR. Moderate RV HK  - CVP 20 today. Volume status trending up. Give 80 mg IV lasix.  - Continue Farxiga 10 mg daily  - Continue  digoxin 0.0625 mg daily.  - Continue spiro 12.5 mg daily. - renal function stable.  - Long discussion with him again on 7/15 regarding options. He is interested in advanced therapies now. We discussed previous barriers: lack of insurance, non-compliance and lack of supportive caregiver. I met with SW. He now has insurance to cover. We discussed pros/cons transplant vs VAD. Echo with EF 25-30% on DBA with severe MR/TR.  - Felt not to be candidate for VAD/transplant due to issues above.  -  RHC/LHC with stable 1V disease, moderate mixed pulmonary HTN, moderately reduced CO on DBA 5, and prominent v waves in PCWP tracing suggestive of significant MR - TEE EF 25% severe MR - Given that he is nt candidate for advanced therapies, I think mTEER is only option to attempt to stabilize him and get off DBA. I d/w Dr. Burt Knack at length. Will plane swan/IABP today in an attempt to optimize pre mTEER. We discussed that if he can improve compliance and social support that he may be a transplant candidate down the road.  - I left a detailed message for his brother  2. AKI on CKD Stage IIIa likely due to ATN/overdiuresis - Creatinine baseline 1.6 - 1.82 on admit>>1.7  today  - follow BMP    3. PVCs - Continue amio 200 mg bid. - K and Mag stable.   4. CAD  - Cath this admit stable with 1-v CAD with chronically occluded RCA - No s/s angina - Continue ASA and atorvastatin.   5. Transaminitis - suspect due to passive congestion vs low output (co-ox ok)  6. Severe MR/TR - plan mTEER tomorrow as above  - swan/IABP today   Length of Stay: 6  Glori Bickers, MD  04/14/2022, 8:35 AM  Advanced Heart Failure Team Pager [phone] (M-F; 7a - 5p)  Please contact [facility] for night-coverage after hours (5p -7a ) and weekends on [email or web address]  Patient seen and examined with the above-signed Advanced Practice Provider and/or Housestaff. I personally reviewed laboratory data, imaging studies and relevant  notes. I independently examined the patient and formulated the important aspects of the plan. I have edited the note to reflect any of my changes or salient points. I have personally discussed the plan with the patient and/or family.  Cath yesterday with stable 1v cad. Hemodynamics suggestive of significant MR  Remains on DBA 5. Feels weak. SOB today Co-ox 63%. CVP 15-20  General:  Weak appearing. No resp difficulty HEENT: normal Neck: supple. JVP to ear  Carotids 2+ bilat; no bruits. No lymphadenopathy or thryomegaly appreciated. Cor: PMI nondisplaced. Regular rate & rhythm. 2/6 MR/TR Lungs: clear Abdomen: soft, nontender, nondistended. No hepatosplenomegaly. No bruits or masses. Good bowel sounds. Extremities: no cyanosis, clubbing, rash, edema Neuro: alert & orientedx3, cranial nerves grossly intact. moves all 4 extremities w/o difficulty. Affect pleasant  Remains tenuous. He has been presented at [facility] and felt not to be a transplant candidate due to issues with transportation, noncompliance and lack of caregiver support. Also felt to be poor VAD candidate for similar reasons.   Will plan TEE today to see if we can stabilize with mTEER.   Diurese with IV lasix.   Glori Bickers, MD  8:35 AM

## 2022-04-14 NOTE — Progress Notes (Signed)
ANTICOAGULATION CONSULT NOTE - Follow Up Consult  Pharmacy Consult for heparin Indication:  IABP  Labs: Recent Labs    04/12/22 0450 04/12/22 1350 04/13/22 0421 04/14/22 0634 04/14/22 1031 04/14/22 1039 04/14/22 1040 04/14/22 2223  HGB 12.0*   < >  --   --  11.9* 12.2* 9.5*  --   HCT 36.6*   < >  --   --  35.0* 36.0* 28.0*  --   PLT 196  --   --   --   --   --   --   --   HEPARINUNFRC  --   --   --   --   --   --   --  0.24*  CREATININE 1.49*  --  1.52* 1.73*  --   --   --   --    < > = values in this interval not displayed.    Assessment/Plan:  61yo male therapeutic on heparin with initial dosing for IABP. Will continue infusion at current rate of 900 units/hr and confirm stable with am labs.   Vernard Gambles, PharmD, BCPS  04/14/2022,11:50 PM

## 2022-04-14 NOTE — Progress Notes (Signed)
ANTICOAGULATION CONSULT NOTE - Initial Consult  Pharmacy Consult for heparin Indication:  IABP  Allergies  Allergen Reactions   Avocado Nausea Only   Shellfish Allergy Nausea And Vomiting    Patient Measurements: Height: 5\' 8"  (172.7 cm) Weight: 63.4 kg (139 lb 12.4 oz) IBW/kg (Calculated) : 68.4 Heparin Dosing Weight: 63kg  Vital Signs: Temp: 97 F (36.1 C) (07/19 1415) Temp Source: Core (07/19 1215) BP: 117/84 (07/19 1400) Pulse Rate: 71 (07/19 1415)  Labs: Recent Labs    04/12/22 0450 04/12/22 1350 04/12/22 1352 04/13/22 0421 04/14/22 0634  HGB 12.0* 11.9*  12.6* 11.9*  --   --   HCT 36.6* 35.0*  37.0* 35.0*  --   --   PLT 196  --   --   --   --   CREATININE 1.49*  --   --  1.52* 1.73*    Estimated Creatinine Clearance: 41.2 mL/min (A) (by C-G formula based on SCr of 1.73 mg/dL (H)).   Medical History: Past Medical History:  Diagnosis Date   Congestive heart failure (CHF) (HCC)    secondary to ischemic cardiomyopathy with an ejection fraction of 25-30%   Coronary artery disease    History of hypertension    Inguinal hernia 2005   History of left inguinal hernia, status post repair in 2005   Tachycardia 2010     Unexplained tachycardia during hospitalization    Assessment: 61 year old male now s/p RHC and IABP insertion this afternoon in preparation for mTEER tomorrow by structural heart team. Discussed with team and will start low dose heparin. No bleeding or hematoma issues noted post cath.  Goal of Therapy:  Heparin level 0.2-0.5 units/ml Monitor platelets by anticoagulation protocol: Yes   Plan:  Start heparin infusion at 900 units/hr Check anti-Xa level in 6 hours and daily while on heparin Continue to monitor H&H and platelets  46 PharmD., BCPS Clinical Pharmacist 04/14/2022 2:40 PM

## 2022-04-14 NOTE — Interval H&P Note (Signed)
History and Physical Interval Note:  04/14/2022 10:02 AM  Zachary Arias  has presented today for surgery, with the diagnosis of HF.  The various methods of treatment have been discussed with the patient and family. After consideration of risks, benefits and other options for treatment, the patient has consented to  Procedure(s): IABP Insertion (N/A) and SWAN-Ganz catheter insertion as a surgical intervention.  The patient's history has been reviewed, patient examined, no change in status, stable for surgery.  I have reviewed the patient's chart and labs.  Questions were answered to the patient's satisfaction.     Marwan Lipe

## 2022-04-15 ENCOUNTER — Encounter (HOSPITAL_COMMUNITY)
Admission: EM | Disposition: A | Discharge status: Home / Self Care | Payer: Self-pay | Source: Home / Self Care | Attending: Internal Medicine

## 2022-04-15 ENCOUNTER — Inpatient Hospital Stay (HOSPITAL_COMMUNITY): Payer: Medicaid Other

## 2022-04-15 ENCOUNTER — Encounter (HOSPITAL_COMMUNITY): Payer: Self-pay | Admitting: Internal Medicine

## 2022-04-15 DIAGNOSIS — I110 Hypertensive heart disease with heart failure: Secondary | ICD-10-CM

## 2022-04-15 DIAGNOSIS — Z006 Encounter for examination for normal comparison and control in clinical research program: Secondary | ICD-10-CM

## 2022-04-15 DIAGNOSIS — I340 Nonrheumatic mitral (valve) insufficiency: Secondary | ICD-10-CM

## 2022-04-15 DIAGNOSIS — I5023 Acute on chronic systolic (congestive) heart failure: Secondary | ICD-10-CM | POA: Diagnosis not present

## 2022-04-15 DIAGNOSIS — I2510 Atherosclerotic heart disease of native coronary artery without angina pectoris: Secondary | ICD-10-CM

## 2022-04-15 DIAGNOSIS — I5022 Chronic systolic (congestive) heart failure: Secondary | ICD-10-CM

## 2022-04-15 DIAGNOSIS — Z9889 Other specified postprocedural states: Secondary | ICD-10-CM

## 2022-04-15 HISTORY — PX: MITRAL VALVE REPAIR: CATH118311

## 2022-04-15 HISTORY — DX: Other specified postprocedural states: Z98.890

## 2022-04-15 HISTORY — PX: TEE WITHOUT CARDIOVERSION: SHX5443

## 2022-04-15 LAB — POCT ACTIVATED CLOTTING TIME
Activated Clotting Time: 245 seconds
Activated Clotting Time: 269 seconds
Activated Clotting Time: 299 seconds
Activated Clotting Time: 269 seconds
Activated Clotting Time: 287 seconds

## 2022-04-15 LAB — PROTIME-INR
INR: 1.3 — ABNORMAL HIGH (ref 0.8–1.2)
Prothrombin Time: 15.9 seconds — ABNORMAL HIGH (ref 11.4–15.2)

## 2022-04-15 LAB — ECHO TEE
Area-P 1/2: 10 cm2
MV M vel: 5.6 m/s
MV M vel: 4.81 m/s
MV Peak grad: 125.4 mmHg
MV Peak grad: 92.4 mmHg
Radius: 0.6 cm
Radius: 0.3 cm

## 2022-04-15 LAB — CBC
HCT: 33.6 % — ABNORMAL LOW (ref 39.0–52.0)
HCT: 33.6 % — ABNORMAL LOW (ref 39.0–52.0)
Hemoglobin: 10.6 g/dL — ABNORMAL LOW (ref 13.0–17.0)
Hemoglobin: 10.3 g/dL — ABNORMAL LOW (ref 13.0–17.0)
MCH: 28 pg (ref 26.0–34.0)
MCH: 27.2 pg (ref 26.0–34.0)
MCHC: 31.5 g/dL (ref 30.0–36.0)
MCHC: 30.7 g/dL (ref 30.0–36.0)
MCV: 88.7 fL (ref 80.0–100.0)
MCV: 88.7 fL (ref 80.0–100.0)
Platelets: 160 10*3/uL (ref 150–400)
Platelets: 120 10*3/uL — ABNORMAL LOW (ref 150–400)
RBC: 3.79 MIL/uL — ABNORMAL LOW (ref 4.22–5.81)
RBC: 3.79 MIL/uL — ABNORMAL LOW (ref 4.22–5.81)
RDW: 17.7 % — ABNORMAL HIGH (ref 11.5–15.5)
RDW: 17.5 % — ABNORMAL HIGH (ref 11.5–15.5)
WBC: 9.5 10*3/uL (ref 4.0–10.5)
WBC: 11.6 10*3/uL — ABNORMAL HIGH (ref 4.0–10.5)
nRBC: 0.5 % — ABNORMAL HIGH (ref 0.0–0.2)
nRBC: 0.2 % (ref 0.0–0.2)

## 2022-04-15 LAB — BASIC METABOLIC PANEL
Anion gap: 7 (ref 5–15)
Anion gap: 11 (ref 5–15)
BUN: 33 mg/dL — ABNORMAL HIGH (ref 6–20)
BUN: 25 mg/dL — ABNORMAL HIGH (ref 6–20)
CO2: 25 mmol/L (ref 22–32)
CO2: 26 mmol/L (ref 22–32)
Calcium: 7.7 mg/dL — ABNORMAL LOW (ref 8.9–10.3)
Calcium: 7.9 mg/dL — ABNORMAL LOW (ref 8.9–10.3)
Chloride: 113 mmol/L — ABNORMAL HIGH (ref 98–111)
Chloride: 113 mmol/L — ABNORMAL HIGH (ref 98–111)
Creatinine, Ser: 1.41 mg/dL — ABNORMAL HIGH (ref 0.61–1.24)
Creatinine, Ser: 1.12 mg/dL (ref 0.61–1.24)
GFR, Estimated: 57 mL/min — ABNORMAL LOW (ref >60)
GFR, Estimated: >60 mL/min (ref >60)
Glucose, Bld: 119 mg/dL — ABNORMAL HIGH (ref 70–99)
Glucose, Bld: 106 mg/dL — ABNORMAL HIGH (ref 70–99)
Potassium: 3.4 mmol/L — ABNORMAL LOW (ref 3.5–5.1)
Potassium: 3.3 mmol/L — ABNORMAL LOW (ref 3.5–5.1)
Sodium: 145 mmol/L (ref 135–145)
Sodium: 150 mmol/L — ABNORMAL HIGH (ref 135–145)

## 2022-04-15 LAB — COOXEMETRY PANEL
Carboxyhemoglobin: 1.2 % (ref 0.5–1.5)
Methemoglobin: <0.7 % (ref 0.0–1.5)
O2 Saturation: 57.6 %
Total hemoglobin: 10.7 g/dL — ABNORMAL LOW (ref 12.0–16.0)

## 2022-04-15 LAB — HEPARIN LEVEL (UNFRACTIONATED): Heparin Unfractionated: 0.29 IU/mL — ABNORMAL LOW (ref 0.30–0.70)

## 2022-04-15 SURGERY — MITRAL VALVE REPAIR
Anesthesia: General

## 2022-04-15 MED ORDER — HEPARIN SODIUM (PORCINE) 1000 UNIT/ML IJ SOLN
INTRAMUSCULAR | Status: DC | PRN
  Administered 2022-04-15: 4000 [IU] via INTRAVENOUS
  Administered 2022-04-15: 9000 [IU] via INTRAVENOUS
  Administered 2022-04-15: 3000 [IU] via INTRAVENOUS
  Administered 2022-04-15: 4000 [IU] via INTRAVENOUS

## 2022-04-15 MED ORDER — CEFAZOLIN SODIUM-DEXTROSE 2-4 GM/100ML-% IV SOLN
INTRAVENOUS | Status: AC
  Filled 2022-04-15: qty 100

## 2022-04-15 MED ORDER — FENTANYL CITRATE (PF) 250 MCG/5ML IJ SOLN
INTRAMUSCULAR | Status: DC | PRN
  Administered 2022-04-15: 50 ug via INTRAVENOUS

## 2022-04-15 MED ORDER — HEPARIN (PORCINE) IN NACL 2000-0.9 UNIT/L-% IV SOLN
INTRAVENOUS | Status: DC | PRN
  Administered 2022-04-15 (×5): 1000 mL

## 2022-04-15 MED ORDER — ETOMIDATE 2 MG/ML IV SOLN
INTRAVENOUS | Status: DC | PRN
  Administered 2022-04-15: 12 mg via INTRAVENOUS

## 2022-04-15 MED ORDER — SODIUM CHLORIDE 0.9 % IV SOLN: 250.0000 mL | INTRAVENOUS | Status: DC | PRN

## 2022-04-15 MED ORDER — VASOPRESSIN 20 UNIT/ML IV SOLN
INTRAVENOUS | Status: DC | PRN
  Administered 2022-04-15: 1 [IU] via INTRAVENOUS

## 2022-04-15 MED ORDER — HYDRALAZINE HCL 20 MG/ML IJ SOLN: 5.0000 mg | INTRAMUSCULAR | Status: DC | PRN

## 2022-04-15 MED ORDER — NOREPINEPHRINE 4 MG/250ML-% IV SOLN
INTRAVENOUS | Status: DC | PRN
  Administered 2022-04-15: 4 ug/min via INTRAVENOUS

## 2022-04-15 MED ORDER — HEPARIN (PORCINE) 25000 UT/250ML-% IV SOLN
900.0000 [IU]/h | INTRAVENOUS | Status: DC
  Administered 2022-04-16: 900 [IU]/h via INTRAVENOUS
  Filled 2022-04-15: qty 250

## 2022-04-15 MED ORDER — SUGAMMADEX SODIUM 200 MG/2ML IV SOLN
INTRAVENOUS | Status: DC | PRN
  Administered 2022-04-15: 200 mg via INTRAVENOUS

## 2022-04-15 MED ORDER — MIDAZOLAM HCL 2 MG/2ML IJ SOLN
INTRAMUSCULAR | Status: DC | PRN
  Administered 2022-04-15: 2 mg via INTRAVENOUS

## 2022-04-15 MED ORDER — PHENYLEPHRINE 80 MCG/ML (10ML) SYRINGE FOR IV PUSH (FOR BLOOD PRESSURE SUPPORT)
PREFILLED_SYRINGE | INTRAVENOUS | Status: DC | PRN
  Administered 2022-04-15: 160 ug via INTRAVENOUS

## 2022-04-15 MED ORDER — ACETAMINOPHEN 325 MG PO TABS: 650.0000 mg | ORAL_TABLET | ORAL | Status: DC | PRN

## 2022-04-15 MED ORDER — FUROSEMIDE 10 MG/ML IJ SOLN
80.0000 mg | Freq: Once | INTRAMUSCULAR | Status: AC
Start: 2022-04-15 — End: 2022-04-15
  Administered 2022-04-15: 80 mg via INTRAVENOUS
  Filled 2022-04-15: qty 8

## 2022-04-15 MED ORDER — SODIUM CHLORIDE 0.9% FLUSH
3.0000 mL | Freq: Two times a day (BID) | INTRAVENOUS | Status: DC
  Administered 2022-04-16 – 2022-04-19 (×7): 3 mL via INTRAVENOUS

## 2022-04-15 MED ORDER — EPINEPHRINE HCL 5 MG/250ML IV SOLN IN NS
INTRAVENOUS | Status: DC | PRN
  Administered 2022-04-15: 2 ug/kg/min via INTRAVENOUS

## 2022-04-15 MED ORDER — HEPARIN SODIUM (PORCINE) 1000 UNIT/ML IJ SOLN
INTRAMUSCULAR | Status: AC
  Filled 2022-04-15: qty 20

## 2022-04-15 MED ORDER — ONDANSETRON HCL 4 MG/2ML IJ SOLN
INTRAMUSCULAR | Status: DC | PRN
  Administered 2022-04-15: 4 mg via INTRAVENOUS

## 2022-04-15 MED ORDER — ONDANSETRON HCL 4 MG/2ML IJ SOLN: 4.0000 mg | Freq: Four times a day (QID) | INTRAMUSCULAR | Status: DC | PRN

## 2022-04-15 MED ORDER — LABETALOL HCL 5 MG/ML IV SOLN: 10.0000 mg | INTRAVENOUS | Status: DC | PRN

## 2022-04-15 MED ORDER — LIDOCAINE-EPINEPHRINE (PF) 2 %-1:200000 IJ SOLN
INTRAMUSCULAR | Status: AC
  Filled 2022-04-15: qty 20

## 2022-04-15 MED ORDER — LACTATED RINGERS IV SOLN: INTRAVENOUS | Status: DC | PRN

## 2022-04-15 MED ORDER — POTASSIUM CHLORIDE CRYS ER 20 MEQ PO TBCR: 40.0000 meq | EXTENDED_RELEASE_TABLET | Freq: Once | ORAL | Status: DC

## 2022-04-15 MED ORDER — POTASSIUM CHLORIDE CRYS ER 20 MEQ PO TBCR
60.0000 meq | EXTENDED_RELEASE_TABLET | Freq: Once | ORAL | Status: AC
Start: 2022-04-15 — End: 2022-04-15
  Administered 2022-04-15: 60 meq via ORAL
  Filled 2022-04-15: qty 3

## 2022-04-15 MED ORDER — HEPARIN (PORCINE) IN NACL 1000-0.9 UT/500ML-% IV SOLN
INTRAVENOUS | Status: DC | PRN
  Administered 2022-04-15: 500 mL

## 2022-04-15 MED ORDER — SODIUM CHLORIDE 0.9% FLUSH: 3.0000 mL | INTRAVENOUS | Status: DC | PRN

## 2022-04-15 MED ORDER — ROCURONIUM BROMIDE 10 MG/ML (PF) SYRINGE
PREFILLED_SYRINGE | INTRAVENOUS | Status: DC | PRN
  Administered 2022-04-15: 60 mg via INTRAVENOUS
  Administered 2022-04-15 (×3): 20 mg via INTRAVENOUS

## 2022-04-15 SURGICAL SUPPLY — 17 items
CATH MITRA STEERABLE GUIDE (CATHETERS) ×1 IMPLANT
CLIP MITRA G4 DELIVERY SYS XTW (Clip) ×2 IMPLANT
CLOSURE PERCLOSE PROSTYLE (VASCULAR PRODUCTS) ×2 IMPLANT
KIT DILATOR VASC 18G NDL (KITS) ×1 IMPLANT
KIT HEART LEFT (KITS) ×6 IMPLANT
KIT MICROPUNCTURE NIT STIFF (SHEATH) ×1 IMPLANT
KIT VERSACROSS LRG ACCESS (CATHETERS) ×1 IMPLANT
PACK CARDIAC CATHETERIZATION (CUSTOM PROCEDURE TRAY) ×2 IMPLANT
SHEATH PINNACLE 8F 10CM (SHEATH) ×1 IMPLANT
SHEATH PROBE COVER 6X72 (BAG) ×2 IMPLANT
SHIELD RADPAD SCOOP 12X17 (MISCELLANEOUS) ×1 IMPLANT
STOPCOCK MORSE 400PSI 3WAY (MISCELLANEOUS) ×15 IMPLANT
SYSTEM MITRACLIP G4 (SYSTAGENIX WOUND MANAGEMENT) ×1 IMPLANT
TRANSDUCER W/STOPCOCK (MISCELLANEOUS) ×2 IMPLANT
TUBING ART PRESS 72  MALE/FEM (TUBING) ×2
TUBING ART PRESS 72 MALE/FEM (TUBING) ×1 IMPLANT
TUBING CIL FLEX 10 FLL-RA (TUBING) ×1 IMPLANT

## 2022-04-15 NOTE — Anesthesia Preprocedure Evaluation (Addendum)
Anesthesia Evaluation  Patient identified by MRN, date of birth, ID band Patient awake    Reviewed: Allergy & Precautions, H&P , NPO status , Patient's Chart, lab work & pertinent test results  Airway Mallampati: II  TM Distance: >3 FB Neck ROM: Full    Dental no notable dental hx. (+) Teeth Intact, Dental Advisory Given   Pulmonary neg pulmonary ROS,    Pulmonary exam normal breath sounds clear to auscultation       Cardiovascular hypertension, + CAD and +CHF   Rhythm:Regular Rate:Normal     Neuro/Psych negative neurological ROS  negative psych ROS   GI/Hepatic negative GI ROS, Neg liver ROS,   Endo/Other  negative endocrine ROS  Renal/GU Renal InsufficiencyRenal disease  negative genitourinary   Musculoskeletal   Abdominal   Peds  Hematology negative hematology ROS (+)   Anesthesia Other Findings   Reproductive/Obstetrics negative OB ROS                             Anesthesia Physical Anesthesia Plan  ASA: 4  Anesthesia Plan: General   Post-op Pain Management: Minimal or no pain anticipated   Induction: Intravenous  PONV Risk Score and Plan: 3 and Ondansetron, Dexamethasone and Midazolam  Airway Management Planned: Oral ETT  Additional Equipment:   Intra-op Plan:   Post-operative Plan: Extubation in OR and Possible Post-op intubation/ventilation  Informed Consent: I have reviewed the patients History and Physical, chart, labs and discussed the procedure including the risks, benefits and alternatives for the proposed anesthesia with the patient or authorized representative who has indicated his/her understanding and acceptance.   Patient has DNR.  Discussed DNR with patient and Suspend DNR.   Dental advisory given  Plan Discussed with: CRNA  Anesthesia Plan Comments:        Anesthesia Quick Evaluation

## 2022-04-15 NOTE — Progress Notes (Signed)
ANTICOAGULATION CONSULT NOTE   Pharmacy Consult for heparin Indication:  IABP  Labs: Recent Labs    04/13/22 0421 04/14/22 0634 04/14/22 1031 04/14/22 1039 04/14/22 1040 04/14/22 2223 04/15/22 0448  HGB  --   --    < > 12.2* 9.5*  --  10.6*  HCT  --   --    < > 36.0* 28.0*  --  33.6*  PLT  --   --   --   --   --   --  160  LABPROT  --   --   --   --   --   --  15.9*  INR  --   --   --   --   --   --  1.3*  HEPARINUNFRC  --   --   --   --   --  0.24* 0.29*  CREATININE 1.52* 1.73*  --   --   --   --   --    < > = values in this interval not displayed.     Assessment/Plan:  61yo male therapeutic on heparin with initial dosing for IABP. Will continue infusion at current rate of 900 units/hr with therapeutic level x 2. No bleeding issues noted. For surgery this morning.   Sheppard Coil PharmD., BCPS Clinical Pharmacist 04/15/2022 8:02 AM

## 2022-04-15 NOTE — Progress Notes (Addendum)
PROGRESS NOTE    Zachary Arias  ZSW:[id number] DOB: May 24, 1961 DOA: 04/08/2022 PCP: Default, Provider, MD  60/M with history of chronic systolic CHF EF 30-35% end-stage inotrope dependent on dobutamine, CAD with PCI and stents, chronic hypoxic respiratory failure on 2 L home O2, CKD 3 a presented to the ED with dyspnea.  Has been on continuous dobutamine infusion for low output heart failure since April, also followed by hospice. -In the ED volume overloaded, started on diuretics, 7/17 underwent cardiac cath cardiac cath which noted stable coronary disease, moderately reduced cardiac output and prominent V wave and manage pressures suggesting significant MR, TEE 7/18 noted severe mitral regurgitation  -Seen by structural heart team 7/18, plan for mitral valve clip -7/19-elevated filling pressures, IABP placed   Subjective: Feels okay, no events overnight, breathing better overall  Assessment and Plan:  Acute on chronic systolic (congestive) heart failure (HCC) Severe mitral regurgitation 04/23 echo with LV systolic function 30 to 35%, global hypokinesis, preserved RV systolic function, severe enlargement of pulmonary artery with RVSP 64,0 mmHg. Severe LA enlargement. Severe mitral regurgitation,  -Mixed cardiomyopathy, cardiac MRI 09/2009 with EF of 31%, inferior scar -Considered end-stage heart failure, inotrope dependent on home dobutamine -Not a candidate for advanced therapies, no support system, not on citizen status etc. -Right heart cath/LHC this admission noted stable one-vessel disease, moderate pulmonary hypertension, moderately reduced CO on dobutamine and severe MR, -TEE 7/18 concerning for severe MR, structural heart team consulting mitral valve clip being considered this week -Per heart failure team, balloon pump placed 7/19, dobutamine discontinued, now on milrinone, plan for mitral clip today  Coronary artery disease involving native coronary artery of native heart without  angina pectoris Continue with aspirin and statin.  -LHC with one-vessel CAD, chronically occluded RCA  AKI on CKD 3a -Baseline creatinine around 1.6, creatinine close to baseline monitor  Acute on chronic respiratory failure with hypoxia (HCC) Patient with severe pulmonary hypertension. -Improving, on 2 L home O2  Frequent PVCs -Remains on amiodarone  Goals of care, counseling/discussion Followed by hospice but now considering possible advance heart failure interventions.  Code status is DNR    DVT prophylaxis: Lovenox Code Status: DNR Family Communication: Discussed with patient detail, no family at bedside Disposition Plan: To be determined  Consultants: Advanced heart failure team   Procedures: 7/17 RHC/LHC - on DBA 5. Stable 1V CAD, moderate mixed pulmonary HTN, moderately reduced CO on DBA 5, prominent v waves in PCWP tracing suggestive of significant MR   TEE 7/19  LVEF 25% severe central MR. RV moderately down.   Antimicrobials:    Objective: Vitals:   04/15/22 0600 04/15/22 0700 04/15/22 0800 04/15/22 0900  BP: 116/84 118/85 124/88 (!) 125/92  Pulse: (!) 102 (!) 214 (!) 160 (!) 217  Resp: 13 12 13 13   Temp: (!) 96.4 F (35.8 C) (!) 96.4 F (35.8 C) (!) 96.8 F (36 C) (!) 97.2 F (36.2 C)  TempSrc:   Core   SpO2: 96% 96% 100% 100%  Weight:      Height:        Intake/Output Summary (Last 24 hours) at 04/15/2022 1158 Last data filed at 04/15/2022 1100 Gross per 24 hour  Intake 1649.89 ml  Output 4150 ml  Net -2500.11 ml   Filed Weights   04/13/22 0807 04/14/22 0417 04/15/22 0500  Weight: 63.6 kg 63.4 kg 63.8 kg    Examination:  General exam: Chronically ill young male sitting up in bed, AAOx3, no distress  CVS: S1-S2, regular rate rhythm systolic murmur Lungs: Clear bilaterally Abdomen: Soft, nontender, bowel sounds present Extremities: No edema Skin: No rashes Psychiatry:  Mood & affect appropriate.     Data Reviewed:   CBC: Recent  Labs  Lab 04/08/22 2055 04/09/22 0403 04/12/22 0450 04/12/22 1350 04/12/22 1352 04/14/22 1031 04/14/22 1039 04/14/22 1040 04/15/22 0448  WBC 9.6 9.8 12.7*  --   --   --   --   --  9.5  NEUTROABS 7.2 7.0  --   --   --   --   --   --   --   HGB 12.2* 12.5* 12.0*   < > 11.9* 11.9* 12.2* 9.5* 10.6*  HCT 38.2* 38.2* 36.6*   < > 35.0* 35.0* 36.0* 28.0* 33.6*  MCV 85.8 86.6 85.7  --   --   --   --   --  88.7  PLT 282 269 196  --   --   --   --   --  160   < > = values in this interval not displayed.   Basic Metabolic Panel: Recent Labs  Lab 04/09/22 0403 04/10/22 0340 04/11/22 0420 04/12/22 0450 04/12/22 1350 04/13/22 0421 04/14/22 0634 04/14/22 1031 04/14/22 1039 04/14/22 1040 04/15/22 0448  NA 145   < > 141 144   < > 142 144 149* 149* 158* 145  K 3.8   < > 3.6 4.2   < > 4.4 3.9 3.6 3.6 2.5* 3.4*  CL 109   < > 108 112*  --  112* 112*  --   --   --  113*  CO2 23   < > 23 24  --  25 27  --   --   --  25  GLUCOSE 115*   < > 110* 130*  --  255* 197*  --   --   --  119*  BUN 22*   < > 32* 33*  --  35* 38*  --   --   --  33*  CREATININE 1.65*   < > 1.73* 1.49*  --  1.52* 1.73*  --   --   --  1.41*  CALCIUM 8.7*   < > 8.7* 8.7*  --  8.8* 8.6*  --   --   --  7.7*  MG 2.5*  --   --  2.6*  --  2.6*  --   --   --   --   --   PHOS  --   --   --   --   --  3.8  --   --   --   --   --    < > = values in this interval not displayed.   GFR: Estimated Creatinine Clearance: 50.9 mL/min (A) (by C-G formula based on SCr of 1.41 mg/dL (H)). Liver Function Tests: Recent Labs  Lab 04/09/22 0403  AST 94*  ALT 226*  ALKPHOS 139*  BILITOT 1.7*  PROT 5.6*  ALBUMIN 3.3*   No results for input(s): "LIPASE", "AMYLASE" in the last 168 hours. No results for input(s): "AMMONIA" in the last 168 hours. Coagulation Profile: Recent Labs  Lab 04/15/22 0448  INR 1.3*   Cardiac Enzymes: No results for input(s): "CKTOTAL", "CKMB", "CKMBINDEX", "TROPONINI" in the last 168 hours. BNP (last 3  results) No results for input(s): "PROBNP" in the last 8760 hours. HbA1C: No results for input(s): "HGBA1C" in the last 72 hours. CBG: No results for  input(s): "GLUCAP" in the last 168 hours. Lipid Profile: No results for input(s): "CHOL", "HDL", "LDLCALC", "TRIG", "CHOLHDL", "LDLDIRECT" in the last 72 hours. Thyroid Function Tests: No results for input(s): "TSH", "T4TOTAL", "FREET4", "T3FREE", "THYROIDAB" in the last 72 hours. Anemia Panel: No results for input(s): "VITAMINB12", "FOLATE", "FERRITIN", "TIBC", "IRON", "RETICCTPCT" in the last 72 hours. Urine analysis:    Component Value Date/Time   COLORURINE YELLOW 03/05/2020 0557   APPEARANCEUR CLEAR 03/05/2020 0557   LABSPEC 1.025 03/05/2020 0557   PHURINE 5.5 03/05/2020 0557   GLUCOSEU NEGATIVE 03/05/2020 0557   HGBUR TRACE (A) 03/05/2020 0557   BILIRUBINUR NEGATIVE 03/05/2020 0557   KETONESUR NEGATIVE 03/05/2020 0557   PROTEINUR 100 (A) 03/05/2020 0557   UROBILINOGEN 0.2 06/24/2009 1116   NITRITE NEGATIVE 03/05/2020 0557   LEUKOCYTESUR NEGATIVE 03/05/2020 0557   Sepsis Labs: @LABRCNTIP (procalcitonin:4,lacticidven:4)  ) Recent Results (from the past 240 hour(s))  SARS Coronavirus 2 by RT PCR (hospital order, performed in [facility] hospital lab) *cepheid single result test* Anterior Nasal Swab     Status: None   Collection Time: 04/08/22  8:55 PM   Specimen: Anterior Nasal Swab  Result Value Ref Range Status   SARS Coronavirus 2 by RT PCR NEGATIVE NEGATIVE Final    Comment: (NOTE) SARS-CoV-2 target nucleic acids are NOT DETECTED.  The SARS-CoV-2 RNA is generally detectable in upper and lower respiratory specimens during the acute phase of infection. The lowest concentration of SARS-CoV-2 viral copies this assay can detect is 250 copies / mL. A negative result does not preclude SARS-CoV-2 infection and should not be used as the sole basis for treatment or other patient management decisions.  A negative result may  occur with improper specimen collection / handling, submission of specimen other than nasopharyngeal swab, presence of viral mutation(s) within the areas targeted by this assay, and inadequate number of viral copies (<250 copies / mL). A negative result must be combined with clinical observations, patient history, and epidemiological information.  Fact Sheet for Patients:   04/10/22  Fact Sheet for Healthcare Providers: RoadLapTop.co.za  This test is not yet approved or  cleared by the http://kim-miller.com/ FDA and has been authorized for detection and/or diagnosis of SARS-CoV-2 by FDA under an Emergency Use Authorization (EUA).  This EUA will remain in effect (meaning this test can be used) for the duration of the COVID-19 declaration under Section 564(b)(1) of the Act, 21 U.S.C. section 360bbb-3(b)(1), unless the authorization is terminated or revoked sooner.  Performed at [facility], [address], [place], [place]   MRSA Next Gen by PCR, Nasal     Status: None   Collection Time: 04/14/22 11:39 AM   Specimen: Nasal Mucosa; Nasal Swab  Result Value Ref Range Status   MRSA by PCR Next Gen NOT DETECTED NOT DETECTED Final    Comment: (NOTE) The GeneXpert MRSA Assay (FDA approved for NASAL specimens only), is one component of a comprehensive MRSA colonization surveillance program. It is not intended to diagnose MRSA infection nor to guide or monitor treatment for MRSA infections. Test performance is not FDA approved in patients less than 79 years old. Performed at [facility], [address], [place], [place]      Radiology Studies: DG CHEST PORT 1 VIEW  Result Date: 04/14/2022 CLINICAL DATA:  Cardiogenic shock, shortness of breath EXAM: PORTABLE CHEST 1 VIEW COMPARISON:  04/09/2022 FINDINGS: 2 frontal views of the chest demonstrate an enlarged cardiac silhouette. Right-sided PICC tip  overlies superior vena cava. Right internal jugular flow directed central venous catheter tip overlies right pulmonary artery. Metallic tip of an intra-aortic balloon pump is seen along the distal margin of the aortic arch at the T5-6 level. Mild diffuse interstitial prominence compatible with edema. No airspace disease, effusion, or pneumothorax. No acute bony abnormality. IMPRESSION: 1. Support devices as above. 2. Enlarged cardiac silhouette. 3. Mild interstitial edema. Electronically Signed   By: Randa Ngo M.D.   On: 04/14/2022 17:18   CARDIAC CATHETERIZATION  Result Date: 04/14/2022 Findings: On DBA 5 Ao = 98/58 (71) RA = 11 RV = 56/14 PA = 63/24 (36) PCW = 26 (v = 32) Fick cardiac output/index = 3.5/2.0 Thermo CO/CI 3.1/1.8 PVR = 3.0 WU FA sat = 97% PA sat = 55%, 56% PAPi = 3.5 Assessment: 1. Low output HF with elevated filling pressures Plan/Discussion: IABP placed. For mTEER tomorrow. Continue to adjust inotropes. Glori Bickers, MD 3:50 PM    Scheduled Meds:  amiodarone  200 mg Oral BID   aspirin EC  81 mg Oral Daily   atorvastatin  40 mg Oral Daily   bisacodyl  5 mg Oral Once   Chlorhexidine Gluconate Cloth  6 each Topical Daily   digoxin  0.0625 mg Oral Daily   sodium chloride flush  10-40 mL Intracatheter Q12H   sodium chloride flush  3 mL Intravenous Q12H   sodium chloride flush  3 mL Intravenous Q12H   spironolactone  12.5 mg Oral Daily   Continuous Infusions:  sodium chloride 10 mL/hr at 04/15/22 1100   sodium chloride 10 mL/hr at 04/15/22 1100    ceFAZolin (ANCEF) IV     heparin 900 Units/hr (04/15/22 1100)   milrinone 0.5 mcg/kg/min (04/15/22 1100)     LOS: 7 days    Time spent: 55min    Domenic Polite, MD [facility]   04/15/2022, 11:58 AM

## 2022-04-15 NOTE — Progress Notes (Addendum)
[place] VALVE TEAM  Patient Name: Zachary Arias Date of Encounter: 04/15/2022  Primary Cardiologist: Dr. Haroldine Laws, MD/ Dr. Burt Knack, MD & Dr. Ali Lowe, MD ([facility])  Hospital Problem List     Principal Problem:   Acute on chronic systolic (congestive) heart failure [facility]) Active Problems:   Coronary artery disease involving native coronary artery of native heart without angina pectoris   Acute kidney injury superimposed on chronic kidney disease ([place])   Acute on chronic respiratory failure with hypoxia (HCC)   Goals of care, counseling/discussion   Frequent PVCs   Severe mitral regurgitation   Subjective   Patient with no complaints. Awaiting TEER later today with Dr. Burt Knack. Anesthesia at bedside.   Inpatient Medications    Scheduled Meds:  amiodarone  200 mg Oral BID   aspirin EC  81 mg Oral Daily   atorvastatin  40 mg Oral Daily   bisacodyl  5 mg Oral Once   Chlorhexidine Gluconate Cloth  6 each Topical Daily   digoxin  0.0625 mg Oral Daily   sodium chloride flush  10-40 mL Intracatheter Q12H   sodium chloride flush  3 mL Intravenous Q12H   sodium chloride flush  3 mL Intravenous Q12H   spironolactone  12.5 mg Oral Daily   Continuous Infusions:  sodium chloride 10 mL/hr at 04/15/22 0900   sodium chloride 10 mL/hr at 04/15/22 0900    ceFAZolin (ANCEF) IV     heparin 900 Units/hr (04/15/22 0900)   milrinone 0.375 mcg/kg/min (04/15/22 0900)   PRN Meds: sodium chloride, sodium chloride, acetaminophen **OR** acetaminophen, LORazepam, ondansetron **OR** ondansetron (ZOFRAN) IV, polyethylene glycol, sodium chloride flush, sodium chloride flush   Vital Signs    Vitals:   04/15/22 0600 04/15/22 0700 04/15/22 0800 04/15/22 0900  BP: 116/84 118/85 124/88 (!) 125/92  Pulse: (!) 102 (!) 214 (!) 160 (!) 217  Resp: 13 12 13 13   Temp: (!) 96.4 F (35.8 C) (!) 96.4 F (35.8 C) (!) 96.8 F (36 C) (!) 97.2 F (36.2 C)   TempSrc:   Core   SpO2: 96% 96% 100% 100%  Weight:      Height:        Intake/Output Summary (Last 24 hours) at 04/15/2022 1001 Last data filed at 04/15/2022 0900 Gross per 24 hour  Intake 1574.32 ml  Output 2600 ml  Net -1025.68 ml   Filed Weights   04/13/22 0807 04/14/22 0417 04/15/22 0500  Weight: 63.6 kg 63.4 kg 63.8 kg   Physical Exam    General: Well developed, NAD Neck: No JVD Lungs:Clear to ausculation bilaterally. Breathing is unlabored. Cardiovascular: RRR with S1 S2. + murmur Abdomen: Soft, non-tender, non-distended. No obvious abdominal masses. Extremities: No edema. IABP in LFA. + LE pulses  Neuro: Alert and oriented. Psych: Responds to questions appropriately with normal affect.    Labs    CBC Recent Labs    04/14/22 1040 04/15/22 0448  WBC  --  9.5  HGB 9.5* 10.6*  HCT 28.0* 33.6*  MCV  --  88.7  PLT  --  0000000   Basic Metabolic Panel Recent Labs    04/13/22 0421 04/14/22 0634 04/14/22 1031 04/14/22 1040 04/15/22 0448  NA 142 144   < > 158* 145  K 4.4 3.9   < > 2.5* 3.4*  CL 112* 112*  --   --  113*  CO2 25 27  --   --  25  GLUCOSE 255* 197*  --   --  119*  BUN 35* 38*  --   --  33*  CREATININE 1.52* 1.73*  --   --  1.41*  CALCIUM 8.8* 8.6*  --   --  7.7*  MG 2.6*  --   --   --   --   PHOS 3.8  --   --   --   --    < > = values in this interval not displayed.   Liver Function Tests No results for input(s): "AST", "ALT", "ALKPHOS", "BILITOT", "PROT", "ALBUMIN" in the last 72 hours. No results for input(s): "LIPASE", "AMYLASE" in the last 72 hours. Cardiac Enzymes No results for input(s): "CKTOTAL", "CKMB", "CKMBINDEX", "TROPONINI" in the last 72 hours. BNP Invalid input(s): "POCBNP" D-Dimer No results for input(s): "DDIMER" in the last 72 hours. Hemoglobin A1C No results for input(s): "HGBA1C" in the last 72 hours. Fasting Lipid Panel No results for input(s): "CHOL", "HDL", "LDLCALC", "TRIG", "CHOLHDL", "LDLDIRECT" in the last 72  hours. Thyroid Function Tests No results for input(s): "TSH", "T4TOTAL", "T3FREE", "THYROIDAB" in the last 72 hours.  Invalid input(s): "FREET3"  Telemetry    NSR - Personally Reviewed  ECG    No new tracing as of 04/15/22 - Personally Reviewed  Radiology    DG CHEST PORT 1 VIEW  Result Date: 04/14/2022 CLINICAL DATA:  Cardiogenic shock, shortness of breath EXAM: PORTABLE CHEST 1 VIEW COMPARISON:  04/09/2022 FINDINGS: 2 frontal views of the chest demonstrate an enlarged cardiac silhouette. Right-sided PICC tip overlies superior vena cava. Right internal jugular flow directed central venous catheter tip overlies right pulmonary artery. Metallic tip of an intra-aortic balloon pump is seen along the distal margin of the aortic arch at the T5-6 level. Mild diffuse interstitial prominence compatible with edema. No airspace disease, effusion, or pneumothorax. No acute bony abnormality. IMPRESSION: 1. Support devices as above. 2. Enlarged cardiac silhouette. 3. Mild interstitial edema. Electronically Signed   By: Sharlet Salina M.D.   On: 04/14/2022 17:18   CARDIAC CATHETERIZATION  Result Date: 04/14/2022 Findings: On DBA 5 Ao = 98/58 (71) RA = 11 RV = 56/14 PA = 63/24 (36) PCW = 26 (v = 32) Fick cardiac output/index = 3.5/2.0 Thermo CO/CI 3.1/1.8 PVR = 3.0 WU FA sat = 97% PA sat = 55%, 56% PAPi = 3.5 Assessment: 1. Low output HF with elevated filling pressures Plan/Discussion: IABP placed. For mTEER tomorrow. Continue to adjust inotropes. Arvilla Meres, MD 3:50 PM   Cardiac Studies   [facility] 04/12/22:    Mid LAD lesion is 40% stenosed.   Mid Cx lesion is 30% stenosed.   Prox RCA lesion is 50% stenosed.   Dist RCA lesion is 100% stenosed.   1st Mrg lesion is 30% stenosed.   Findings:   On DBA 5   Ao = 89/68 (79) LV = 93/18 RA = 9 RV = 67/15 PA = 65/26 (39) PCW = 23 (v = 31) Fick cardiac output/index = 4.1/2.3 PVR = 3.9 WU FA sat = 98% PA sat = 64%, 65% PAPi = 4.3    Assessment: 1. Stable 1v CAD 2. Moderate mixed pulmonary HTN 3. Moderately reduced CO on DBA 5 4. Prominent v waves in PCWP tracing suggestive of significant MR   Plan/Discussion:   Plan TEE to further evaluate. Suspect he will need advanced therapies  Echocardiogram 04/10/22:   1. Left ventricular ejection fraction, by estimation, is 30 to 35%. The  left ventricle has moderate to severely decreased function. The left  ventricle demonstrates global  hypokinesis. The left ventricular internal  cavity size was moderately to severely  dilated. There is mild left ventricular hypertrophy. Left ventricular  diastolic parameters are consistent with Grade III diastolic dysfunction  (restrictive). Elevated left atrial pressure.   2. Right ventricular systolic function is normal. The right ventricular  size is mildly enlarged. There is severely elevated pulmonary artery  systolic pressure.   3. Left atrial size was severely dilated.   4. Right atrial size was severely dilated.   5. Dilated ventricle leading to poor MV coaptation and severe functional  MR.. The mitral valve is abnormal. Severe mitral valve regurgitation. No  evidence of mitral stenosis.   6. Hepatic systolic flow reversal consistent with severe TR      . The tricuspid valve is abnormal. Tricuspid valve regurgitation is  severe.   7. The aortic valve has an indeterminant number of cusps. Aortic valve  regurgitation is not visualized. No aortic stenosis is present.   8. The inferior vena cava is dilated in size with <50% respiratory  variability, suggesting right atrial pressure of 15 mmHg.   ZIO 12/10/21:  Patch Wear Time:  13 days and 11 hours (2023-02-23T14:58:30-0500 to 2023-03-09T02:08:24-0500)   1. Sinus rhythm - avg HR of 99 bpm. 2. 1164 runs of nonsustained Ventricular Tachycardia occurred, the run with the fastest interval lasting 19 beats with a max rate of 255 bpm (avg 191 bpm); the run with the fastest interval  was also the longest. Ventricular Tachycardia was detected within +/- 45 seconds of symptomatic patient event(s). 3. Frequent PVCs (13.1%, [id number]),  Ventricular Couplets were occasional (3.9%, [id number]), and VE Triplets were occasional (1.1%, 6941). - there were 3 major morphologies for PVCs (all around 4% prevalence) 4. Ventricular Bigeminy and Trigeminy were present. 5. Some VT runs may be SVT with abberancy   Patient Profile     Yogi Colla is a 61 y.o. male with a hx of mixed ischemic and non-ischemic end-stage, dobutamine dependent cardiomyopathy with LVEF in the 25-30% range (with familial component dx 05/2009), CAD with chronically occluded RCA on last LHC, CKD stage IIIa, PVCs, and severe mitral and tricuspid regurgitation who is being seen today for the evaluation for potential TEER at the request of Dr. Haroldine Laws.  Assessment & Plan    Severe mitral regurgitation with acute on chronic systolic CHF: Followed by [facility] for quite some time. Most recently he has been managed with HH dobutamine infusions due to progressive CHF symptoms. SHT consulted 7/18 after presenting to [facility] with increased fatigue, weakness, and SOB. R/LHC 04/12/22 with stable one vessel CAD, moderate mixed pulmonary HTN, moderately reduced CO on DBA 5, and prominent V waves in PCWP tracing suggestive of significant MR. Subsequent TEE performed 04/12/22 that showed poor coaptation with posterior leaflet restriction and severe 4+ central/posterior MR with systolic flow reversal in PVs. His case was presented at [place] clinic meeting and felt not to be a good candidate for transplant or VAD given issues with transportation, noncompliance and lack of caregiver support therefore SHT asked to evaluate for potential TEER as this may be his only option. PLan was to insert IABP and Swan catheter (7/19) to optimize him prior to TEER today. He was transitioned to Milrinone from dobutamine yesterday evening with the addition of Lasix with only mild  improvement overnight.   AKI with CKD Stage IIIa: Baseline creatinine appears to be in the 1.4-1.6 range. Cr today 1.41. Felt to be to over diuresis.   CAD: R/LHC this admission with  stable one vessel CAD, moderate mixed pulmonary HTN, moderately reduced CO on DBA 5, and prominent V waves in PCWP tracing suggestive of significant MR. Continue with medical management of CAD. No anginal symptoms.    SignedGeorgie Chard, NP  04/15/2022, 10:01 AM  Pager [phone]   Patient seen, examined. Available data reviewed. Agree with findings, assessment, and plan as outlined by Georgie Chard, NP.  The patient is independently interviewed and examined.  He is alert, oriented, in no distress.  On exam, JVP is normal, HEENT is normal, lungs are clear bilaterally, heart is regular rate and rhythm without S3 gallop and a 2/6 holosystolic murmur at the apex.  The apex is diffuse and boggy.  Abdomen is soft and nontender, extremities have 1+ ankle edema, balloon pump site in the left groin is clear.  The patient continues to exhibit signs of low output heart failure.  He has been transition from dobutamine to IV milrinone.  He is now being treated with an intra-aortic balloon pump in order to best optimize his hemodynamics for transcatheter edge-to-edge repair of the mitral valve.  We have had extensive discussion in multidisciplinary fashion with the advanced heart failure team, the patient, and his family.  Considering the fact that he is not a candidate for advanced heart failure therapies such as LVAD or cardiac transplantation, it is reasonable to proceed with transcatheter edge-to-edge repair of the mitral valve in an attempt to improve his hemodynamics and forward flow so that he can ultimately wean off of chronic inotropic therapy.  He understands that there is an increased risk of general anesthesia and undergoing any procedure in the context of the advanced state of his heart failure.  I have reviewed specific  risks of transcatheter edge-to-edge repair of the mitral valve with the patient.  He understands the risk of serious complication such as myocardial infarction, stroke, cardiac injury, cardiac tamponade, need for pericardiocentesis, mitral valve injury, hemodynamic collapse, serious arrhythmia, and death is in the range of 1 to 2% and I think his risk of complication is more significant than this.  The patient provides full informed consent for the procedure.  Tonny Bollman, M.D. 04/15/2022 12:09 PM

## 2022-04-15 NOTE — Anesthesia Postprocedure Evaluation (Signed)
Anesthesia Post Note  Patient: Zachary Arias  Procedure(s) Performed: MITRAL VALVE REPAIR TRANSESOPHAGEAL ECHOCARDIOGRAM (TEE)     Patient location during evaluation: ICU Anesthesia Type: General Level of consciousness: awake and alert Pain management: pain level controlled Vital Signs Assessment: post-procedure vital signs reviewed and stable Respiratory status: spontaneous breathing, nonlabored ventilation, respiratory function stable and patient connected to nasal cannula oxygen Cardiovascular status: blood pressure returned to baseline and stable Postop Assessment: no apparent nausea or vomiting Anesthetic complications: no   There were no known notable events for this encounter.  Last Vitals:  Vitals:   04/15/22 1200 04/15/22 1307  BP: 127/80   Pulse:    Resp: 15   Temp: (!) 36.1 C   SpO2: 96% 94%    Last Pain:  Vitals:   04/15/22 1200  TempSrc: Core  PainSc:                  Holten Spano,W. EDMOND

## 2022-04-15 NOTE — Progress Notes (Signed)
Male purewick removed due to being in the way for mitral valve clip procedure.  Condom cath was placed to take place of male purewick.  Before condom catheter placed skin break down was noted on head of penis.  Zachary Arias Rt-R [facility] Lynann Bologna Paramedic [facility]IS Lynann Beaver Rt-R VIR

## 2022-04-15 NOTE — Progress Notes (Addendum)
Advanced Heart Failure Rounding Note  PCP-Cardiologist: Dr. Haroldine Laws   Subjective:    Echo 04/10/22 EF 25-30% Severe MR/TR. Moderate RV HK   7/17 RHC/LHC - on DBA 5. Stable 1V CAD, moderate mixed pulmonary HTN, moderately reduced CO on DBA 5, prominent v waves in PCWP tracing suggestive of significant MR  7/19 IABP and swan placed for optimization. PAPi Switched from DBA to Milrinone 0.375 mcg. Given IV lasix.   BMET pending.   Swan numbers on Milrinone 0.375 mcg.  CO-OX 57.6%  RA 13 PA 59/25 (40) PAPi 2.6  CO 3 CI 1.7   Denies SOB.     Objective:   Weight Range: 63.8 kg Body mass index is 21.39 kg/m.   Vital Signs:   Temp:  [96.4 F (35.8 C)-98.4 F (36.9 C)] 96.4 F (35.8 C) (07/20 0700) Pulse Rate:  [67-236] 214 (07/20 0700) Resp:  [10-29] 12 (07/20 0700) BP: (76-126)/(47-88) 118/85 (07/20 0700) SpO2:  [92 %-100 %] 96 % (07/20 0700) Arterial Line BP: (79-116)/(34-55) 93/51 (07/20 0700) Weight:  [63.8 kg] 63.8 kg (07/20 0500) Last BM Date : 04/11/22  Weight change: Filed Weights   04/13/22 0807 04/14/22 0417 04/15/22 0500  Weight: 63.6 kg 63.4 kg 63.8 kg    Intake/Output:   Intake/Output Summary (Last 24 hours) at 04/15/2022 0751 Last data filed at 04/15/2022 0700 Gross per 24 hour  Intake 1503.16 ml  Output 2600 ml  Net -1096.84 ml      Physical Exam   General:   No resp difficulty HEENT: normal Neck: supple. JVP 11-12  Carotids 2+ bilat; no bruits. No lymphadenopathy or thryomegaly appreciated. RIJ swan  Cor: PMI nondisplaced. Regular rate & rhythm. No rubs, gallops. 2/6 MR/TR. Lungs: clear Abdomen: soft, nontender, nondistended. No hepatosplenomegaly. No bruits or masses. Good bowel sounds. Extremities: no cyanosis, clubbing, rash, edema. IABP RFV Neuro: alert & orientedx3, cranial nerves grossly intact. moves all 4 extremities w/o difficulty. Affect pleasant   Telemetry   SR 70-80s   Labs    CBC Recent Labs    04/14/22 1040  04/15/22 0448  WBC  --  9.5  HGB 9.5* 10.6*  HCT 28.0* 33.6*  MCV  --  88.7  PLT  --  604   Basic Metabolic Panel Recent Labs    04/13/22 0421 04/14/22 0634 04/14/22 1031 04/14/22 1039 04/14/22 1040  NA 142 144   < > 149* 158*  K 4.4 3.9   < > 3.6 2.5*  CL 112* 112*  --   --   --   CO2 25 27  --   --   --   GLUCOSE 255* 197*  --   --   --   BUN 35* 38*  --   --   --   CREATININE 1.52* 1.73*  --   --   --   CALCIUM 8.8* 8.6*  --   --   --   MG 2.6*  --   --   --   --   PHOS 3.8  --   --   --   --    < > = values in this interval not displayed.   Liver Function Tests No results for input(s): "AST", "ALT", "ALKPHOS", "BILITOT", "PROT", "ALBUMIN" in the last 72 hours. No results for input(s): "LIPASE", "AMYLASE" in the last 72 hours. Cardiac Enzymes No results for input(s): "CKTOTAL", "CKMB", "CKMBINDEX", "TROPONINI" in the last 72 hours.  BNP: BNP (last 3 results) Recent Labs  02/12/22 1432 03/09/22 1540 04/08/22 2055  BNP 562.1* 503.3* 3,236.0*    ProBNP (last 3 results) No results for input(s): "PROBNP" in the last 8760 hours.   D-Dimer No results for input(s): "DDIMER" in the last 72 hours. Hemoglobin A1C No results for input(s): "HGBA1C" in the last 72 hours. Fasting Lipid Panel No results for input(s): "CHOL", "HDL", "LDLCALC", "TRIG", "CHOLHDL", "LDLDIRECT" in the last 72 hours. Thyroid Function Tests No results for input(s): "TSH", "T4TOTAL", "T3FREE", "THYROIDAB" in the last 72 hours.  Invalid input(s): "FREET3"  Other results:   Imaging    DG CHEST PORT 1 VIEW  Result Date: 04/14/2022 CLINICAL DATA:  Cardiogenic shock, shortness of breath EXAM: PORTABLE CHEST 1 VIEW COMPARISON:  04/09/2022 FINDINGS: 2 frontal views of the chest demonstrate an enlarged cardiac silhouette. Right-sided PICC tip overlies superior vena cava. Right internal jugular flow directed central venous catheter tip overlies right pulmonary artery. Metallic tip of an  intra-aortic balloon pump is seen along the distal margin of the aortic arch at the T5-6 level. Mild diffuse interstitial prominence compatible with edema. No airspace disease, effusion, or pneumothorax. No acute bony abnormality. IMPRESSION: 1. Support devices as above. 2. Enlarged cardiac silhouette. 3. Mild interstitial edema. Electronically Signed   By: Randa Ngo M.D.   On: 04/14/2022 17:18   CARDIAC CATHETERIZATION  Result Date: 04/14/2022 Findings: On DBA 5 Ao = 98/58 (71) RA = 11 RV = 56/14 PA = 63/24 (36) PCW = 26 (v = 32) Fick cardiac output/index = 3.5/2.0 Thermo CO/CI 3.1/1.8 PVR = 3.0 WU FA sat = 97% PA sat = 55%, 56% PAPi = 3.5 Assessment: 1. Low output HF with elevated filling pressures Plan/Discussion: IABP placed. For mTEER tomorrow. Continue to adjust inotropes. Glori Bickers, MD 3:50 PM    Medications:     Scheduled Medications:  amiodarone  200 mg Oral BID   aspirin EC  81 mg Oral Daily   atorvastatin  40 mg Oral Daily   bisacodyl  5 mg Oral Once   Chlorhexidine Gluconate Cloth  6 each Topical Daily   dapagliflozin propanediol  10 mg Oral Daily   digoxin  0.0625 mg Oral Daily   sodium chloride flush  10-40 mL Intracatheter Q12H   sodium chloride flush  3 mL Intravenous Q12H   sodium chloride flush  3 mL Intravenous Q12H   spironolactone  12.5 mg Oral Daily    Infusions:  sodium chloride 10 mL/hr at 04/15/22 0700   sodium chloride 10 mL/hr at 04/15/22 0700    ceFAZolin (ANCEF) IV     heparin 900 Units/hr (04/15/22 0700)   milrinone 0.375 mcg/kg/min (04/15/22 0700)    PRN Medications: sodium chloride, sodium chloride, acetaminophen **OR** acetaminophen, LORazepam, ondansetron **OR** ondansetron (ZOFRAN) IV, polyethylene glycol, sodium chloride flush, sodium chloride flush    Patient Profile   61 y.o. 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 Cath with 1-v CAD with  chronically occluded RCA. MRI in January 2011 with EF 31% with inferior scar. Recent admit 4/23 for a/c CHF w/ low output, requiring DBA. He is end-stage and inotrope dependent, on home DBA. Not a good candidate for advanced therapies with lack of support system, and non-citizen status and does not want this. Has been followed by home hospice.   Presented to ED w/ CC of progressive dyspnea and increased supp O2 requirements.   BNP 3,236. CXR w/ vascular congestion.  Assessment/Plan   1. Acute on Chronic Systolic Heart Failure - Cath with 1-v CAD with chronically occluded RCA. MRI in January 2011 with EF 31% with inferior scar.  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   - Echo (8/13): EF 45-50% .  - No f/u between to 2015-2021 - Echo (6/21): EF 20-25% severe central MR. Repeat cath 6/21 with stable CAD - Echo (4/22): EF 40-45% mild to mod MR RV mild HK  - Echo (4/23): EF 30-35%.   - Admit 4/23 with shock, started on DBA and lasix. Diuresed 20 lbs. Unable to wean DBA - He is end-stage and inotrope dependent. - On Home DBA 5 mcg/kg/min via single lumen PICC. Previously under hospice care - Admitted with a/c HF. Diuresed with IV lasix -  Echo 04/10/22 EF 25-30% Severe MR/TR. Moderate RV HK  - 7/19 had IABP + swan placed. PA pressures elevated on DBA. Switched to Milrinone.0.375 mcg.  - Hemodynamics unchanged with IABP and Milrinone. CO 3 CI 1.8 CO-OX 57.6%  - CVP 13 today. Give 80 mg IV lasix now.  - Stop farxiga.  Consider restarting next day or two. - Continue digoxin 0.0625 mg daily.  - Continue spiro 12.5 mg daily. - renal function stable.  - Long discussion with him again on 7/15 regarding options. He is interested in advanced therapies now. We discussed previous barriers: lack of insurance, non-compliance and lack of supportive caregiver. I met with SW. He now has insurance to cover. We discussed pros/cons transplant vs VAD. Echo with EF 25-30% on  DBA with severe MR/TR.  - Felt not to be candidate for VAD/transplant due to issues above.  - RHC/LHC with stable 1V disease, moderate mixed pulmonary HTN, moderately reduced CO on DBA 5, and prominent v waves in PCWP tracing suggestive of significant MR - TEE EF 25% severe MR - Given that he is not candidate for advanced therapies,structural evaluated and deemed  appropriate for TEER.    2. AKI on CKD Stage IIIa likely due to ATN/overdiuresis - Creatinine baseline 1.6 - 1.82 on admit>>pending.  - follow BMP    3. PVCs - Continue amio 200 mg bid. - K and Mag stable.   4. CAD  - Cath this admit stable with 1-v CAD with chronically occluded RCA - No s/s angina - Continue ASA and atorvastatin.   5. Transaminitis - suspect due to passive congestion vs low output (co-ox ok)  6. Severe MR/TR - plan mTEER today . NPO    Length of Stay: 7  Amy Clegg, NP  04/15/2022, 7:51 AM  Advanced Heart Failure Team Pager [phone] (M-F; 7a - 5p)  Please contact [facility] for night-coverage after hours (5p -7a ) and weekends on [email or web address]  Agree with above.   IABP now in place. Switched DBA to milrinone. Swan numbers relatively stable. Renal function improved. Denis CP or SOB.   General:  Lying flat in bed  No resp difficulty HEENT: normal Neck: supple. RIJ swan  Carotids 2+ bilat; no bruits. No lymphadenopathy or thryomegaly appreciated. Cor: PMI nondisplaced. Regular + s3 2/6 MR Lungs: clear Abdomen: soft, nontender, nondistended. No hepatosplenomegaly. No bruits or masses. Good bowel sounds. Extremities: no cyanosis, clubbing, rash, edema LFA IABP Neuro: alert & orientedx3, cranial nerves grossly intact. moves all 4 extremities w/o difficulty. Affect pleasant  He remains tenuous. Only mildly improvement with IABP. Plan mTEER today to try and stabilize HF as he is currently not candidate for advanced therapies. Continue heparin.  CRITICAL CARE Performed by: Glori Bickers  Total critical care time: 35 minutes  Critical care time was exclusive of separately billable procedures and treating other patients.  Critical care was necessary to treat or prevent imminent or life-threatening deterioration.  Critical care was time spent personally by me (independent of midlevel providers or residents) on the following activities: development of treatment plan with patient and/or surrogate as well as nursing, discussions with consultants, evaluation of patient's response to treatment, examination of patient, obtaining history from patient or surrogate, ordering and performing treatments and interventions, ordering and review of laboratory studies, ordering and review of radiographic studies, pulse oximetry and re-evaluation of patient's condition.  Glori Bickers, MD  9:26 AM

## 2022-04-15 NOTE — Anesthesia Procedure Notes (Signed)
Central Venous Catheter Insertion Performed by: Gaynelle Adu, MD, anesthesiologist Start/End7/20/2023 12:10 PM, 04/15/2022 12:20 PM Patient location: Pre-op. Preanesthetic checklist: patient identified, IV checked, site marked, risks and benefits discussed, surgical consent, monitors and equipment checked, pre-op evaluation, timeout performed and anesthesia consent Position: Trendelenburg Lidocaine 1% used for infiltration and patient sedated Hand hygiene performed , maximum sterile barriers used  and Seldinger technique used Catheter size: 8 Fr Total catheter length 16. Central line was placed.Double lumen Procedure performed using ultrasound guided technique. Ultrasound Notes:anatomy identified, needle tip was noted to be adjacent to the nerve/plexus identified and no ultrasound evidence of intravascular and/or intraneural injection Attempts: 1 Following insertion, dressing applied, line sutured and Biopatch. Post procedure assessment: blood return through all ports  Patient tolerated the procedure well with no immediate complications. Additional procedure comments: I used the ultrasound machine in the cath lab. It doesn't print the images.Marland Kitchen

## 2022-04-15 NOTE — Progress Notes (Addendum)
  CTSP due to bleeding from multiple IV sites. Unable to restart heparin for IABP.   Patient with persistent bleeding from LIJ triple lumen site with significant ongoing oozing despite manual pressure and pressure dressing by nurses. Also oozing from RFV site.  Old dressings removed. Both sites cleaned and prepped sterilely. Injected with 1% lido with Epi. 0-silk pursestring sutures placed. Manual pressure held for 5 mins. With excellent hemostasis.   CBC pending. Hemodynamics stable on inotropes and IABP. Heparin restarted for IABP.   Additional CCT 35 mins.   Arvilla Meres, MD  10:19 PM

## 2022-04-15 NOTE — Transfer of Care (Signed)
Immediate Anesthesia Transfer of Care Note  Patient: Fenix Rorke  Procedure(s) Performed: MITRAL VALVE REPAIR TRANSESOPHAGEAL ECHOCARDIOGRAM (TEE)  Patient Location: SICU  Anesthesia Type:General  Level of Consciousness: drowsy and patient cooperative  Airway & Oxygen Therapy: Patient Spontanous Breathing and Patient connected to face mask oxygen  Post-op Assessment: Report given to RN and Post -op Vital signs reviewed and stable  Post vital signs: Reviewed and stable  Last Vitals:  Vitals Value Taken Time  BP 115/51   Temp    Pulse 160 04/15/22 1658  Resp 18 04/15/22 1658  SpO2 100 % 04/15/22 1658  Vitals shown include unvalidated device data.  Last Pain:  Vitals:   04/15/22 1200  TempSrc: Core  PainSc:          Complications: There were no known notable events for this encounter.

## 2022-04-15 NOTE — Progress Notes (Signed)
  Echocardiogram Echocardiogram Transesophageal has been performed.  Zachary Arias M 04/15/2022, 4:35 PM

## 2022-04-15 NOTE — Progress Notes (Addendum)
ANTICOAGULATION CONSULT NOTE - Initial Consult  Pharmacy Consult for heparin Indication:  IABP  Allergies  Allergen Reactions   Avocado Nausea Only   Shellfish Allergy Nausea And Vomiting   Patient Measurements: Height: 5\' 8"  (172.7 cm) Weight: 63.8 kg (140 lb 10.5 oz) IBW/kg (Calculated) : 68.4 kg Heparin Dosing Weight: 63 kg  Vital Signs: Temp: 97 F (36.1 C) (07/20 1200) Temp Source: Core (07/20 1200) BP: 127/80 (07/20 1200) Pulse Rate: 75 (07/20 1000)  Labs: Recent Labs    04/13/22 0421 04/14/22 0634 04/14/22 1031 04/14/22 1039 04/14/22 1040 04/14/22 2223 04/15/22 0448  HGB  --   --    < > 12.2* 9.5*  --  10.6*  HCT  --   --    < > 36.0* Zachary.0*  --  33.6*  PLT  --   --   --   --   --   --  160  LABPROT  --   --   --   --   --   --  15.9*  INR  --   --   --   --   --   --  1.3*  HEPARINUNFRC  --   --   --   --   --  0.24* 0.29*  CREATININE 1.52* 1.73*  --   --   --   --  1.41*   < > = values in this interval not displayed.    Estimated Creatinine Clearance: 50.9 mL/min (A) (by C-G formula based on SCr of 1.41 mg/dL (H)).  Medical History: Past Medical History:  Diagnosis Date   Congestive heart failure (CHF) (HCC)    secondary to ischemic cardiomyopathy with an ejection fraction of 25-30%   Coronary artery disease    History of hypertension    Inguinal hernia 09/28/2003   History of left inguinal hernia, status post repair in 2005   S/P mitral valve clip implantation 04/15/2022   Mitraclip XTW x2 with Dr. 04/17/2022 and Dr. Excell Seltzer   Tachycardia 09/27/2008    Unexplained tachycardia during hospitalization   Assessment: 61 year old Arias s/p mitral valve repair on 7/20 and s/p RHC and IABP insertion on 7/19.  Pharmacy consulted to restart heparin 8 hours after procedure on 7/20 with no bolus. Anesthesia end time 04/15/22 1659. The nurse reported bleeding around the left central line. CBC stable, platelets 160.   Goal of Therapy:  Heparin level 0.2-0.5  units/ml Monitor platelets by anticoagulation protocol: Yes   Plan:  Re-start heparin infusion at 900 units/hr at 04/16/22 0100 Check anti-Xa level in 6 hours and daily while on heparin Continue to monitor H&H and platelets with daily CBC  Continue to monitor for signs and symptoms of bleeding  04/18/22, Pharm.D PGY1 Pharmacy Resident 04/15/2022 5:39 PM

## 2022-04-15 NOTE — Anesthesia Procedure Notes (Signed)
Procedure Name: Intubation Date/Time: 04/15/2022 1:30 PM  Performed by: Merryl Hacker, RNPre-anesthesia Checklist: Patient identified, Emergency Drugs available, Suction available and Patient being monitored Patient Re-evaluated:Patient Re-evaluated prior to induction Oxygen Delivery Method: Circle System Utilized Preoxygenation: Pre-oxygenation with 100% oxygen Induction Type: IV induction Ventilation: Mask ventilation without difficulty and Oral airway inserted - appropriate to patient size Laryngoscope Size: Mac and 4 Grade View: Grade I Tube type: Oral Tube size: 7.5 mm Number of attempts: 1 Airway Equipment and Method: Stylet and Oral airway Placement Confirmation: ETT inserted through vocal cords under direct vision, positive ETCO2 and breath sounds checked- equal and bilateral Secured at: 21 cm Tube secured with: Tape Dental Injury: Teeth and Oropharynx as per pre-operative assessment  Comments: Eyes taped prior to DL. Gentle DL with MAC 4, grade 1 view. 7.5 ETT passed through the cords. +chest rise and fall, +BBS, +EtCO2.  Performed by Heide Scales, SRNA

## 2022-04-16 ENCOUNTER — Inpatient Hospital Stay (HOSPITAL_COMMUNITY): Payer: Medicaid Other

## 2022-04-16 ENCOUNTER — Encounter (HOSPITAL_COMMUNITY): Payer: Self-pay | Admitting: Cardiovascular Disease

## 2022-04-16 DIAGNOSIS — I340 Nonrheumatic mitral (valve) insufficiency: Secondary | ICD-10-CM | POA: Diagnosis not present

## 2022-04-16 DIAGNOSIS — Z954 Presence of other heart-valve replacement: Secondary | ICD-10-CM

## 2022-04-16 DIAGNOSIS — I5023 Acute on chronic systolic (congestive) heart failure: Secondary | ICD-10-CM | POA: Diagnosis not present

## 2022-04-16 DIAGNOSIS — Z006 Encounter for examination for normal comparison and control in clinical research program: Secondary | ICD-10-CM | POA: Diagnosis not present

## 2022-04-16 DIAGNOSIS — I130 Hypertensive heart and chronic kidney disease with heart failure and stage 1 through stage 4 chronic kidney disease, or unspecified chronic kidney disease: Secondary | ICD-10-CM | POA: Diagnosis not present

## 2022-04-16 DIAGNOSIS — Z66 Do not resuscitate: Secondary | ICD-10-CM | POA: Diagnosis not present

## 2022-04-16 LAB — ECHOCARDIOGRAM COMPLETE
AR max vel: 3.35 cm2
AV Peak grad: 5 mmHg
Ao pk vel: 1.12 m/s
Area-P 1/2: 1.88 cm2
Height: 68 in
MV M vel: 3.38 m/s
MV Peak grad: 45.7 mmHg
MV VTI: 1.4 cm2
S' Lateral: 5.7 cm
Weight: 2225.76 oz

## 2022-04-16 LAB — COOXEMETRY PANEL
Carboxyhemoglobin: 2.1 % — ABNORMAL HIGH (ref 0.5–1.5)
Carboxyhemoglobin: 2 % — ABNORMAL HIGH (ref 0.5–1.5)
Methemoglobin: 1 % (ref 0.0–1.5)
Methemoglobin: <0.7 % (ref 0.0–1.5)
O2 Saturation: 77 %
O2 Saturation: 67.3 %
Total hemoglobin: 9.9 g/dL — ABNORMAL LOW (ref 12.0–16.0)
Total hemoglobin: 9.6 g/dL — ABNORMAL LOW (ref 12.0–16.0)

## 2022-04-16 LAB — BASIC METABOLIC PANEL
Anion gap: 4 — ABNORMAL LOW (ref 5–15)
BUN: 22 mg/dL — ABNORMAL HIGH (ref 6–20)
CO2: 27 mmol/L (ref 22–32)
Calcium: 7.6 mg/dL — ABNORMAL LOW (ref 8.9–10.3)
Chloride: 115 mmol/L — ABNORMAL HIGH (ref 98–111)
Creatinine, Ser: 1.06 mg/dL (ref 0.61–1.24)
GFR, Estimated: >60 mL/min (ref >60)
Glucose, Bld: 86 mg/dL (ref 70–99)
Potassium: 4.3 mmol/L (ref 3.5–5.1)
Sodium: 146 mmol/L — ABNORMAL HIGH (ref 135–145)

## 2022-04-16 LAB — HEPARIN LEVEL (UNFRACTIONATED): Heparin Unfractionated: 0.34 IU/mL (ref 0.30–0.70)

## 2022-04-16 LAB — CBC
HCT: 30.4 % — ABNORMAL LOW (ref 39.0–52.0)
Hemoglobin: 9.7 g/dL — ABNORMAL LOW (ref 13.0–17.0)
MCH: 28 pg (ref 26.0–34.0)
MCHC: 31.9 g/dL (ref 30.0–36.0)
MCV: 87.9 fL (ref 80.0–100.0)
Platelets: 112 10*3/uL — ABNORMAL LOW (ref 150–400)
RBC: 3.46 MIL/uL — ABNORMAL LOW (ref 4.22–5.81)
RDW: 17.5 % — ABNORMAL HIGH (ref 11.5–15.5)
WBC: 9.3 10*3/uL (ref 4.0–10.5)
nRBC: 0.2 % (ref 0.0–0.2)

## 2022-04-16 LAB — DIGOXIN LEVEL: Digoxin Level: 0.6 ng/mL — ABNORMAL LOW (ref 0.8–2.0)

## 2022-04-16 LAB — POCT ACTIVATED CLOTTING TIME
Activated Clotting Time: 137 seconds
Activated Clotting Time: 137 seconds

## 2022-04-16 NOTE — Progress Notes (Signed)
   IABP cut back to 1:2   CO-OX 67%.  PA 45/19 CO 3.8  CI 2  Discussed with [name].   Stop heparin and wean IABP 1:3.   Notified cath lab.   IABP removal order set placed.   Trevor Wilkie NP-C  11:58 AM

## 2022-04-16 NOTE — Progress Notes (Addendum)
Advanced Heart Failure Rounding Note  PCP-Cardiologist: Dr. Haroldine Laws   Subjective:    Echo 04/10/22 EF 25-30% Severe MR/TR. Moderate RV HK   7/17 RHC/LHC - on DBA 5. Stable 1V CAD, moderate mixed pulmonary HTN, moderately reduced CO on DBA 5, prominent v waves in PCWP tracing suggestive of significant MR  7/19 IABP and swan placed for optimization. PAPi Switched from DBA to Milrinone 0.375 mcg. Given IV lasix.   7/20 S/P mTEER   Swan numbers on Milrinone 0.25 mcg.  CO-OX 77% RA 3-4 PA 40/15  CO 5.4 CI 3    Denies SOB. Denies pain.     Objective:   Weight Range: 63.1 kg Body mass index is 21.15 kg/m.   Vital Signs:   Temp:  [95.5 F (35.3 C)-97.5 F (36.4 C)] 96.4 F (35.8 C) (07/21 0715) Pulse Rate:  [67-240] 69 (07/21 0715) Resp:  [9-20] 18 (07/21 0715) BP: (93-127)/(51-92) 93/51 (07/21 0700) SpO2:  [79 %-100 %] 99 % (07/21 0715) Arterial Line BP: (86-121)/(43-62) 94/50 (07/21 0715) Weight:  [63.1 kg] 63.1 kg (07/21 0500) Last BM Date : 04/11/22  Weight change: Filed Weights   04/14/22 0417 04/15/22 0500 04/16/22 0500  Weight: 63.4 kg 63.8 kg 63.1 kg    Intake/Output:   Intake/Output Summary (Last 24 hours) at 04/16/2022 0749 Last data filed at 04/16/2022 0600 Gross per 24 hour  Intake 1750.91 ml  Output 3075 ml  Net -1324.09 ml      Physical Exam  General:   No resp difficulty HEENT: normal Neck: supple. no JVD. Carotids 2+ bilat; no bruits. No lymphadenopathy or thryomegaly appreciated. LIJ RIJ swan Cor: PMI nondisplaced. Regular rate & rhythm. No rubs, gallops or murmurs. Lungs: clear Abdomen: soft, nontender, nondistended. No hepatosplenomegaly. No bruits or masses. Good bowel sounds. Extremities: no cyanosis, clubbing, rash, edema.Marland Kitchen LFV IABP. RUE PICC  Neuro: alert & orientedx3, cranial nerves grossly intact. moves all 4 extremities w/o difficulty. Affect pleasant  Telemetry   SR 70-80s   Labs    CBC Recent Labs     04/15/22 0448 04/15/22 2125  WBC 9.5 11.6*  HGB 10.6* 10.3*  HCT 33.6* 33.6*  MCV 88.7 88.7  PLT 160 413*   Basic Metabolic Panel Recent Labs    04/15/22 2125 04/16/22 0427  NA 150* 146*  K 3.3* 4.3  CL 113* 115*  CO2 26 27  GLUCOSE 106* 86  BUN 25* 22*  CREATININE 1.12 1.06  CALCIUM 7.9* 7.6*   Liver Function Tests No results for input(s): "AST", "ALT", "ALKPHOS", "BILITOT", "PROT", "ALBUMIN" in the last 72 hours. No results for input(s): "LIPASE", "AMYLASE" in the last 72 hours. Cardiac Enzymes No results for input(s): "CKTOTAL", "CKMB", "CKMBINDEX", "TROPONINI" in the last 72 hours.  BNP: BNP (last 3 results) Recent Labs    02/12/22 1432 03/09/22 1540 04/08/22 2055  BNP 562.1* 503.3* 3,236.0*    ProBNP (last 3 results) No results for input(s): "PROBNP" in the last 8760 hours.   D-Dimer No results for input(s): "DDIMER" in the last 72 hours. Hemoglobin A1C No results for input(s): "HGBA1C" in the last 72 hours. Fasting Lipid Panel No results for input(s): "CHOL", "HDL", "LDLCALC", "TRIG", "CHOLHDL", "LDLDIRECT" in the last 72 hours. Thyroid Function Tests No results for input(s): "TSH", "T4TOTAL", "T3FREE", "THYROIDAB" in the last 72 hours.  Invalid input(s): "FREET3"  Other results:   Imaging    CARDIAC CATHETERIZATION  Result Date: 04/15/2022 Successful transcatheter edges repair of the mitral valve using 2 MitraClip  G4 XT W devices, first device positioned lateral side of A2/P2 and second device position the medial side of A2/P2 Recommend: Postoperative day #1 echo tomorrow Keep IABP in place overnight (discussed with Dr. Haroldine Laws) Aspirin 81 mg daily for at least 3 months   ECHO TEE  Result Date: 04/15/2022    TRANSESOPHOGEAL ECHO REPORT   Patient Name:   Zachary Arias Date of Exam: 04/15/2022 Medical Rec #:  [id number]     Height:       68.0 in Accession #:    [id number]    Weight:       140.7 lb Date of Birth:  June 16, 1961     BSA:          1.760  m Patient Age:    61 years      BP:           113/80 mmHg Patient Gender: M             HR:           83 bpm. Exam Location:  Inpatient Procedure: 3D Echo, Cardiac Doppler, Color Doppler and Transesophageal Echo Indications:    mitral valve insufficiency  History:        Patient has prior history of Echocardiogram examinations, most                 recent 04/10/2022. CHF, CAD; Risk Factors:Hypertension. Mitral                 Clip's implanted on 04/15/22, XTW and XTW.  Sonographer:    Darlina Sicilian RDCS Referring Phys: [id number] Jerolene Kupfer R Quintasia Theroux PROCEDURE: The transesophogeal probe was passed without difficulty through the esophogus of the patient. Sedation performed by different physician. The patient developed no complications during the procedure. IMPRESSIONS  1. Intervential TEE for MitraClip.  2. Prior to procedure, there was severe ventricular functional mitral regurgitation (torrential). Regurgitant fraction 68% by 2D PISA, with right pulmonary vein systolic flow reversal. Mitral valve area 5.53 cm2.  3. Mid-mid transeptal puncture performed with no complication.  4. Attempt was made for NTW TEER intervention. Unable to grasp anterior and posterior leaflets.  5. Placement of an XTW Mitra-Clip in the lateral position. Mean gradient of 5 mm Hg at a heart rate of 89 bpm. Improved mitral regugitation, still severe. Double orifice area with combined valve area 4.15 cm2.  6. Placement of an XTW Mitra-Clip in the medial position. Mean gradient of 6 mm Hg at a heart rate of 84 bpm. Mild to moderate residual mitral regurgitation (+2). Resolution of right pulmonary vein systolic flow reversal. Triple orifice area combined valve area 3.3 cm2. Successful mTEER intervention.  7. Left to right shunting post procedure. No procedural pericardial effusion.  8. Left ventricular ejection fraction, by estimation, is 25 to 30%. The left ventricle has severely decreased function. The left ventricular internal cavity size was severely  dilated.  9. Right ventricular systolic function is normal. The right ventricular size is normal. 10. Left atrial size was severely dilated. No left atrial/left atrial appendage thrombus was detected. 11. Right atrial size was severely dilated. 12. Tricuspid valve regurgitation is mild to moderate. 13. The aortic valve is tricuspid. Aortic valve regurgitation is trivial. No aortic stenosis is present. FINDINGS  Left Ventricle: Left ventricular ejection fraction, by estimation, is 25 to 30%. The left ventricle has severely decreased function. The left ventricular internal cavity size was severely dilated. Right Ventricle: The right ventricular size is normal. Right vetricular wall thickness  was not well visualized. Right ventricular systolic function is normal. Left Atrium: Left atrial size was severely dilated. No left atrial/left atrial appendage thrombus was detected. Right Atrium: Right atrial size was severely dilated. Pericardium: Trivial pericardial effusion is present. Mitral Valve: The mitral valve is grossly normal. Severe mitral valve regurgitation. MV peak gradient, 8.0 mmHg. The mean mitral valve gradient is 3.5 mmHg. Tricuspid Valve: The tricuspid valve is normal in structure. Tricuspid valve regurgitation is mild to moderate. No evidence of tricuspid stenosis. Aortic Valve: The aortic valve is tricuspid. Aortic valve regurgitation is trivial. No aortic stenosis is present. Pulmonic Valve: The pulmonic valve was normal in structure. Pulmonic valve regurgitation is mild. Aorta: The aortic root is normal in size and structure. IAS/Shunts: Evidence of atrial level shunting detected by color flow Doppler.  MITRAL VALVE MV Area (plan): 4.93 cm MV Peak grad:   8.0 mmHg MV Mean grad:   3.5 mmHg MV Vmax:        1.42 m/s MV Vmean:       83.9 cm/s MR Peak grad:    92.4 mmHg MR Mean grad:    71.0 mmHg MR Vmax:         480.50 cm/s MR Vmean:        407.0 cm/s MR PISA:         0.57 cm MR PISA Eff ROA: 4 mm MR PISA  Radius:  0.30 cm Rudean Haskell MD Electronically signed by Rudean Haskell MD Signature Date/Time: 04/15/2022/4:59:22 PM    Final      Medications:     Scheduled Medications:  amiodarone  200 mg Oral BID   aspirin EC  81 mg Oral Daily   atorvastatin  40 mg Oral Daily   Chlorhexidine Gluconate Cloth  6 each Topical Daily   digoxin  0.0625 mg Oral Daily   sodium chloride flush  10-40 mL Intracatheter Q12H   sodium chloride flush  3 mL Intravenous Q12H   sodium chloride flush  3 mL Intravenous Q12H   sodium chloride flush  3 mL Intravenous Q12H   spironolactone  12.5 mg Oral Daily    Infusions:  sodium chloride 10 mL/hr at 04/16/22 0600   sodium chloride 10 mL/hr at 04/15/22 1200   sodium chloride     heparin 900 Units/hr (04/16/22 0600)   milrinone 0.25 mcg/kg/min (04/16/22 0600)    PRN Medications: sodium chloride, sodium chloride, sodium chloride, acetaminophen **OR** acetaminophen, acetaminophen, hydrALAZINE, labetalol, LORazepam, ondansetron **OR** ondansetron (ZOFRAN) IV, ondansetron (ZOFRAN) IV, polyethylene glycol, sodium chloride flush, sodium chloride flush, sodium chloride flush    Patient Profile   61 y.o. 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 Cath with 1-v CAD with chronically occluded RCA. MRI in January 2011 with EF 31% with inferior scar. Recent admit 4/23 for a/c CHF w/ low output, requiring DBA. He is end-stage and inotrope dependent, on home DBA. Not a good candidate for advanced therapies with lack of support system, and non-citizen status and does not want this. Has been followed by home hospice.   Presented to ED w/ CC of progressive dyspnea and increased supp O2 requirements.   BNP 3,236. CXR w/ vascular congestion.    Assessment/Plan   1. Acute on Chronic Systolic Heart Failure - Cath with 1-v CAD with chronically occluded RCA. MRI in January 2011 with  EF 31% with inferior scar.  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   - Echo (8/13): EF 45-50% .  - No f/u between to 2015-2021 - Echo (6/21): EF 20-25% severe central MR. Repeat cath 6/21 with stable CAD - Echo (4/22): EF 40-45% mild to mod MR RV mild HK  - Echo (4/23): EF 30-35%.   - Admit 4/23 with shock, started on DBA and lasix. Diuresed 20 lbs. Unable to wean DBA - He is end-stage and inotrope dependent. - On Home DBA 5 mcg/kg/min via single lumen PICC. Previously under hospice care - Admitted with a/c HF. Diuresed with IV lasix -  Echo 04/10/22 EF 25-30% Severe MR/TR. Moderate RV HK   Long discussion with him again on 7/15 regarding options. He is interested in advanced therapies now. We discussed previous barriers: lack of insurance, non-compliance and lack of supportive caregiver. I met with SW. He now has insurance to cover. We discussed pros/cons transplant vs VAD. Echo with EF 25-30% on DBA with severe MR/TR.  - Felt not to be candidate for VAD/transplant due to issues above.  - RHC/LHC with stable 1V disease, moderate mixed pulmonary HTN, moderately reduced CO on DBA 5, and prominent v waves in PCWP tracing suggestive of significant MR - TEE EF 25% severe MR - 7/19 had IABP + swan placed. PA pressures elevated on DBA. Switched to Milrinone.0.375 mcg.  - S/P mTEER. IABP 1:1 + milrinone 0.125 mcg.  Hemodynamics improved today. PA pressures lower/CO improved.  - CVP 3-4.  Does not need diuretics.  - Continue digoxin 0.0625 mg daily.  - Continue spiro 12.5 mg daily. - renal function stable.  - Given that he is not candidate for advanced therapies,structural evaluated and deemed  appropriate for TEER.    2. AKI on CKD Stage IIIa likely due to ATN/overdiuresis - Creatinine baseline 1.6 - 1.82 on admit>>1.06  - follow BMP    3. PVCs - Continue amio 200 mg bid. - K and Mag stable.   4. CAD  - Cath this admit stable with 1-v CAD with  chronically occluded RCA - No s/s angina - Continue ASA and atorvastatin.   5. Transaminitis - suspect due to passive congestion vs low output (co-ox ok)  6. Severe MR/TR -S/P mTEER today .   Will discuss with Dr Haroldine Laws. Anticipate weaning IABP   Length of Stay: 8  Amy Clegg, NP  04/16/2022, 7:49 AM  Advanced Heart Failure Team Pager [phone] (M-F; 7a - 5p)  Please contact [facility] for night-coverage after hours (5p -7a ) and weekends on [email or web address]  Agree with above.  Remains on milrinone and IABP. Hemodynamics much improved after mTEER. REnal function completely normalized.   Remains on heparin. Access site and LIJ sites less bleeding.   General: Lying in bed No resp difficulty HEENT: normal Neck: supple. RIJ swan. LIJ TLC  Carotids 2+ bilat; no bruits. No lymphadenopathy or thryomegaly appreciated. Cor: PMI nondisplaced. Regular rate & rhythm. No rubs, gallops or murmurs. Lungs: clear Abdomen: soft, nontender, nondistended. No hepatosplenomegaly. No bruits or masses. Good bowel sounds. Extremities: no cyanosis, clubbing, rash, edema LFA IABP Neuro: alert & orientedx3, cranial nerves grossly intact. moves all 4 extremities w/o difficulty. Affect pleasant  Hemodynamics much improved with mTEER. Wean IABP today. If tolerates will remove IABP later today. Continue milrinone.   CRITICAL CARE Performed by: Glori Bickers  Total critical care time: 35 minutes  Critical care time was exclusive of separately billable procedures and treating other patients.  Critical care was necessary to treat or prevent imminent or  life-threatening deterioration.  Critical care was time spent personally by me (independent of midlevel providers or residents) on the following activities: development of treatment plan with patient and/or surrogate as well as nursing, discussions with consultants, evaluation of patient's response to treatment, examination of patient, obtaining history  from patient or surrogate, ordering and performing treatments and interventions, ordering and review of laboratory studies, ordering and review of radiographic studies, pulse oximetry and re-evaluation of patient's condition.  Glori Bickers, MD  9:25 AM

## 2022-04-16 NOTE — Progress Notes (Addendum)
[facility]   MULTIDISCIPLINARY HEART VALVE TEAM  Patient Name: Zachary Arias Date of Encounter: 04/16/2022  Primary Cardiologist: Dr. Gala Romney, MD/ Dr. Excell Seltzer, MD & Dr. Lynnette Caffey, MD (MitraClip)  Hospital Problem List     Principal Problem:   Acute on chronic systolic (congestive) heart failure [facility]) Active Problems:   Coronary artery disease involving native coronary artery of native heart without angina pectoris   Acute kidney injury superimposed on chronic kidney disease (HCC)   Acute on chronic respiratory failure with hypoxia (HCC)   Goals of care, counseling/discussion   Frequent PVCs   Severe mitral regurgitation   S/P mitral valve clip implantation   Subjective   Awake and alert this AM with no complaints other than pain from Foley catheter placement.  Inpatient Medications    Scheduled Meds:  amiodarone  200 mg Oral BID   aspirin EC  81 mg Oral Daily   atorvastatin  40 mg Oral Daily   Chlorhexidine Gluconate Cloth  6 each Topical Daily   digoxin  0.0625 mg Oral Daily   sodium chloride flush  10-40 mL Intracatheter Q12H   sodium chloride flush  3 mL Intravenous Q12H   sodium chloride flush  3 mL Intravenous Q12H   sodium chloride flush  3 mL Intravenous Q12H   spironolactone  12.5 mg Oral Daily   Continuous Infusions:  sodium chloride 10 mL/hr at 04/16/22 0800   sodium chloride 10 mL/hr at 04/15/22 1200   sodium chloride     heparin 900 Units/hr (04/16/22 0800)   milrinone 0.25 mcg/kg/min (04/16/22 0800)   PRN Meds: sodium chloride, sodium chloride, sodium chloride, acetaminophen **OR** acetaminophen, acetaminophen, hydrALAZINE, labetalol, LORazepam, ondansetron **OR** ondansetron (ZOFRAN) IV, ondansetron (ZOFRAN) IV, polyethylene glycol, sodium chloride flush, sodium chloride flush, sodium chloride flush   Vital Signs    Vitals:   04/16/22 0700 04/16/22 0715 04/16/22 0800 04/16/22 0801  BP: (!) 93/51  124/67   Pulse: 80 69 (!) 220    Resp: 15 18 17    Temp: (!) 97 F (36.1 C) (!) 96.4 F (35.8 C) (!) 96.8 F (36 C) (!) 96.8 F (36 C)  TempSrc:   Core Core  SpO2: (!) 89% 99% 99%   Weight:      Height:        Intake/Output Summary (Last 24 hours) at 04/16/2022 0832 Last data filed at 04/16/2022 0800 Gross per 24 hour  Intake 2002.26 ml  Output 3225 ml  Net -1222.74 ml   Filed Weights   04/14/22 0417 04/15/22 0500 04/16/22 0500  Weight: 63.4 kg 63.8 kg 63.1 kg    Physical Exam   General: Well developed, well nourished, NAD Skin: Warm, dry, intact  Neck: Mild JVD Lungs:Clear to ausculation bilaterally. Breathing is unlabored. Cardiovascular: RRR with S1 S2. + systolic murmur Abdomen: Soft, non-tender, non-distended. No obvious abdominal masses. Extremities: Trace L> R LE edema. 1-2+ pulses in bilateral DP/PT  Neuro: Alert and oriented.  Psych: Responds to questions appropriately with normal affect.    Labs    CBC Recent Labs    04/15/22 2125 04/16/22 0427  WBC 11.6* 9.3  HGB 10.3* 9.7*  HCT 33.6* 30.4*  MCV 88.7 87.9  PLT 120* 112*   Basic Metabolic Panel Recent Labs    04/18/22 2125 04/16/22 0427  NA 150* 146*  K 3.3* 4.3  CL 113* 115*  CO2 26 27  GLUCOSE 106* 86  BUN 25* 22*  CREATININE 1.12 1.06  CALCIUM 7.9*  7.6*   Liver Function Tests No results for input(s): "AST", "ALT", "ALKPHOS", "BILITOT", "PROT", "ALBUMIN" in the last 72 hours. No results for input(s): "LIPASE", "AMYLASE" in the last 72 hours. Cardiac Enzymes No results for input(s): "CKTOTAL", "CKMB", "CKMBINDEX", "TROPONINI" in the last 72 hours. BNP Invalid input(s): "POCBNP" D-Dimer No results for input(s): "DDIMER" in the last 72 hours. Hemoglobin A1C No results for input(s): "HGBA1C" in the last 72 hours. Fasting Lipid Panel No results for input(s): "CHOL", "HDL", "LDLCALC", "TRIG", "CHOLHDL", "LDLDIRECT" in the last 72 hours. Thyroid Function Tests No results for input(s): "TSH", "T4TOTAL", "T3FREE",  "THYROIDAB" in the last 72 hours.  Invalid input(s): "FREET3"  ECG    No new tracing as of 04/16/22 - Personally Reviewed  Radiology    CARDIAC CATHETERIZATION  Result Date: 04/15/2022 Successful transcatheter edges repair of the mitral valve using 2 MitraClip G4 XT W devices, first device positioned lateral side of A2/P2 and second device position the medial side of A2/P2 Recommend: Postoperative day #1 echo tomorrow Keep IABP in place overnight (discussed with Dr. Haroldine Laws) Aspirin 81 mg daily for at least 3 months   ECHO TEE  Result Date: 04/15/2022    TRANSESOPHOGEAL ECHO REPORT   Patient Name:   Zachary Arias Date of Exam: 04/15/2022 Medical Rec #:  [id number]     Height:       68.0 in Accession #:    [id number]    Weight:       140.7 lb Date of Birth:  [birth year]     BSA:          1.760 m Patient Age:    61 years      BP:           113/80 mmHg Patient Gender: M             HR:           83 bpm. Exam Location:  Inpatient Procedure: 3D Echo, Cardiac Doppler, Color Doppler and Transesophageal Echo Indications:    mitral valve insufficiency  History:        Patient has prior history of Echocardiogram examinations, most                 recent 04/10/2022. CHF, CAD; Risk Factors:Hypertension. Mitral                 Clip's implanted on 04/15/22, XTW and XTW.  Sonographer:    Darlina Sicilian RDCS Referring Phys: [id number] DANIEL R BENSIMHON PROCEDURE: The transesophogeal probe was passed without difficulty through the esophogus of the patient. Sedation performed by different physician. The patient developed no complications during the procedure. IMPRESSIONS  1. Intervential TEE for MitraClip.  2. Prior to procedure, there was severe ventricular functional mitral regurgitation (torrential). Regurgitant fraction 68% by 2D PISA, with right pulmonary vein systolic flow reversal. Mitral valve area 5.53 cm2.  3. Mid-mid transeptal puncture performed with no complication.  4. Attempt was made for NTW TEER intervention.  Unable to grasp anterior and posterior leaflets.  5. Placement of an XTW Mitra-Clip in the lateral position. Mean gradient of 5 mm Hg at a heart rate of 89 bpm. Improved mitral regugitation, still severe. Double orifice area with combined valve area 4.15 cm2.  6. Placement of an XTW Mitra-Clip in the medial position. Mean gradient of 6 mm Hg at a heart rate of 84 bpm. Mild to moderate residual mitral regurgitation (+2). Resolution of right pulmonary vein systolic flow reversal. Triple orifice area combined valve  area 3.3 cm2. Successful mTEER intervention.  7. Left to right shunting post procedure. No procedural pericardial effusion.  8. Left ventricular ejection fraction, by estimation, is 25 to 30%. The left ventricle has severely decreased function. The left ventricular internal cavity size was severely dilated.  9. Right ventricular systolic function is normal. The right ventricular size is normal. 10. Left atrial size was severely dilated. No left atrial/left atrial appendage thrombus was detected. 11. Right atrial size was severely dilated. 12. Tricuspid valve regurgitation is mild to moderate. 13. The aortic valve is tricuspid. Aortic valve regurgitation is trivial. No aortic stenosis is present. FINDINGS  Left Ventricle: Left ventricular ejection fraction, by estimation, is 25 to 30%. The left ventricle has severely decreased function. The left ventricular internal cavity size was severely dilated. Right Ventricle: The right ventricular size is normal. Right vetricular wall thickness was not well visualized. Right ventricular systolic function is normal. Left Atrium: Left atrial size was severely dilated. No left atrial/left atrial appendage thrombus was detected. Right Atrium: Right atrial size was severely dilated. Pericardium: Trivial pericardial effusion is present. Mitral Valve: The mitral valve is grossly normal. Severe mitral valve regurgitation. MV peak gradient, 8.0 mmHg. The mean mitral valve  gradient is 3.5 mmHg. Tricuspid Valve: The tricuspid valve is normal in structure. Tricuspid valve regurgitation is mild to moderate. No evidence of tricuspid stenosis. Aortic Valve: The aortic valve is tricuspid. Aortic valve regurgitation is trivial. No aortic stenosis is present. Pulmonic Valve: The pulmonic valve was normal in structure. Pulmonic valve regurgitation is mild. Aorta: The aortic root is normal in size and structure. IAS/Shunts: Evidence of atrial level shunting detected by color flow Doppler.  MITRAL VALVE MV Area (plan): 4.93 cm MV Peak grad:   8.0 mmHg MV Mean grad:   3.5 mmHg MV Vmax:        1.42 m/s MV Vmean:       83.9 cm/s MR Peak grad:    92.4 mmHg MR Mean grad:    71.0 mmHg MR Vmax:         480.50 cm/s MR Vmean:        407.0 cm/s MR PISA:         0.57 cm MR PISA Eff ROA: 4 mm MR PISA Radius:  0.30 cm Rudean Haskell MD Electronically signed by Rudean Haskell MD Signature Date/Time: 04/15/2022/4:59:22 PM    Final    DG CHEST PORT 1 VIEW  Result Date: 04/14/2022 CLINICAL DATA:  Cardiogenic shock, shortness of breath EXAM: PORTABLE CHEST 1 VIEW COMPARISON:  04/09/2022 FINDINGS: 2 frontal views of the chest demonstrate an enlarged cardiac silhouette. Right-sided PICC tip overlies superior vena cava. Right internal jugular flow directed central venous catheter tip overlies right pulmonary artery. Metallic tip of an intra-aortic balloon pump is seen along the distal margin of the aortic arch at the T5-6 level. Mild diffuse interstitial prominence compatible with edema. No airspace disease, effusion, or pneumothorax. No acute bony abnormality. IMPRESSION: 1. Support devices as above. 2. Enlarged cardiac silhouette. 3. Mild interstitial edema. Electronically Signed   By: Randa Ngo M.D.   On: 04/14/2022 17:18   CARDIAC CATHETERIZATION  Result Date: 04/14/2022 Findings: On DBA 5 Ao = 98/58 (71) RA = 11 RV = 56/14 PA = 63/24 (36) PCW = 26 (v = 32) Fick cardiac output/index =  3.5/2.0 Thermo CO/CI 3.1/1.8 PVR = 3.0 WU FA sat = 97% PA sat = 55%, 56% PAPi = 3.5 Assessment: 1. Low output HF with elevated  filling pressures Plan/Discussion: IABP placed. For mTEER tomorrow. Continue to adjust inotropes. Arvilla Meres, MD 3:50 PM   Cardiac Studies   TEER with MitraClip placement 04/15/22:  Successful transcatheter edges repair of the mitral valve using 2 MitraClip G4 XT W devices, first device positioned lateral side of A2/P2 and second device position the medial side of A2/P2   Recommend: Postoperative day #1 echo tomorrow Keep IABP in place overnight (discussed with Dr. Gala Romney) Aspirin 81 mg daily for at least 3 months   [facility] 04/12/22:     Mid LAD lesion is 40% stenosed.   Mid Cx lesion is 30% stenosed.   Prox RCA lesion is 50% stenosed.   Dist RCA lesion is 100% stenosed.   1st Mrg lesion is 30% stenosed.   Findings:   On DBA 5   Ao = 89/68 (79) LV = 93/18 RA = 9 RV = 67/15 PA = 65/26 (39) PCW = 23 (v = 31) Fick cardiac output/index = 4.1/2.3 PVR = 3.9 WU FA sat = 98% PA sat = 64%, 65% PAPi = 4.3   Assessment: 1. Stable 1v CAD 2. Moderate mixed pulmonary HTN 3. Moderately reduced CO on DBA 5 4. Prominent v waves in PCWP tracing suggestive of significant MR   Plan/Discussion:   Plan TEE to further evaluate. Suspect he will need advanced therapies   Echocardiogram 04/10/22:   1. Left ventricular ejection fraction, by estimation, is 30 to 35%. The  left ventricle has moderate to severely decreased function. The left  ventricle demonstrates global hypokinesis. The left ventricular internal  cavity size was moderately to severely  dilated. There is mild left ventricular hypertrophy. Left ventricular  diastolic parameters are consistent with Grade III diastolic dysfunction  (restrictive). Elevated left atrial pressure.   2. Right ventricular systolic function is normal. The right ventricular  size is mildly enlarged. There is severely  elevated pulmonary artery  systolic pressure.   3. Left atrial size was severely dilated.   4. Right atrial size was severely dilated.   5. Dilated ventricle leading to poor MV coaptation and severe functional  MR.. The mitral valve is abnormal. Severe mitral valve regurgitation. No  evidence of mitral stenosis.   6. Hepatic systolic flow reversal consistent with severe TR      . The tricuspid valve is abnormal. Tricuspid valve regurgitation is  severe.   7. The aortic valve has an indeterminant number of cusps. Aortic valve  regurgitation is not visualized. No aortic stenosis is present.   8. The inferior vena cava is dilated in size with <50% respiratory  variability, suggesting right atrial pressure of 15 mmHg.    ZIO 12/10/21:   Patch Wear Time:  13 days and 11 hours (2023-02-23T14:58:30-0500 to 2023-03-09T02:08:24-0500)   1. Sinus rhythm - avg HR of 99 bpm. 2. 1164 runs of nonsustained Ventricular Tachycardia occurred, the run with the fastest interval lasting 19 beats with a max rate of 255 bpm (avg 191 bpm); the run with the fastest interval was also the longest. Ventricular Tachycardia was detected within +/- 45 seconds of symptomatic patient event(s). 3. Frequent PVCs (13.1%, [id number]),  Ventricular Couplets were occasional (3.9%, [id number]), and VE Triplets were occasional (1.1%, 6941). - there were 3 major morphologies for PVCs (all around 4% prevalence) 4. Ventricular Bigeminy and Trigeminy were present. 5. Some VT runs may be SVT with abberancy  Patient Profile     Zachary Arias is a 61 y.o. 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 Haroldine Laws.  Assessment & Plan    Severe mitral regurgitation with acute on chronic systolic CHF: Followed by AHF for quite some time. Most recently he has been managed with HH dobutamine infusions due to progressive CHF symptoms. SHT consulted 7/18 after presenting to [facility] with increased fatigue, weakness, and SOB. R/LHC 04/12/22 with stable one vessel CAD, moderate mixed pulmonary HTN, moderately reduced CO on DBA 5, and prominent V waves in PCWP tracing suggestive of significant MR. Subsequent TEE performed 04/12/22 that showed poor coaptation with posterior leaflet restriction and severe 4+ central/posterior MR with systolic flow reversal in PVs. His case was presented at [place] clinic meeting and felt not to be a good candidate for transplant or VAD given issues with transportation, noncompliance and lack of caregiver support therefore SHT asked to evaluate for potential TEER as this may be his only option. IABP and Swan catheter placed 7/19 for hemodynamic optimization>>remains in place although currently weaning at 2:1. CO improved to 5.4 today. S/p TEER with MitraClip G4 XTW devices, 1st to the lateral side of A2/P2 and 2nd to the medial side of A2/P2. POD 1 echocardiogram pending.    AKI with CKD Stage IIIa: Baseline creatinine appears to be in the 1.4-1.6 range. Cr improved today at 1.06.   CAD: R/LHC this admission with stable one vessel CAD, moderate mixed pulmonary HTN, moderately reduced CO on DBA 5, and prominent V waves in PCWP tracing suggestive of significant MR. Continue with medical management of CAD. No anginal symptoms.    Signed, Kathyrn Drown, NP  04/16/2022, 8:32 AM  Pager [phone]   Patient seen, examined. Available data reviewed. Agree with findings, assessment, and plan as outlined by Kathyrn Drown, NP. The patient is independently interviewed and examined. On exam, he is alert, oriented, in NAD. JVP normal, left IJ bandage saturated with blood but not actively bleeding. Lungs CTA, heart RRR with 2/6 MR murmur  much quieter than baseline, abdomen soft, NT, extremities with 1+ edema, right groin site clear.  This morning's 2D echocardiogram is reviewed and shows both MitraClip devices in place with expected mitral valve leaflet restriction.  The mean transmitral gradient is 4 mmHg.  By my estimation, there is no more than 2+ residual mitral regurgitation.  There is dramatic reduction in mitral regurgitation compared to his baseline study which is directly compared this morning.  The patient is clinically stable with improved hemodynamics.  He will continue under the care of the advanced heart failure team.  His intra-aortic balloon pump is now on 1-2 and will be weaned off hopefully by the end of the day.  Attention will then be turned to weaning him off of milrinone as tolerated.  He should continue on aspirin indefinitely if he will not be anticoagulated.  If he requires anticoagulation, would continue aspirin for 3 months.  Sherren Mocha, M.D. 04/16/2022 11:04 AM

## 2022-04-16 NOTE — Progress Notes (Signed)
PROGRESS NOTE    Zachary Arias  GUY:[id number] DOB: 1960/11/14 DOA: 04/08/2022 PCP: Default, Provider, MD  61/M with history of chronic systolic CHF EF 30-35% end-stage inotrope dependent on dobutamine, CAD with PCI and stents, chronic hypoxic respiratory failure on 2 L home O2, CKD 3 a presented to the ED with dyspnea.  Has been on continuous dobutamine infusion for low output heart failure since April, also followed by hospice. -In the ED volume overloaded, started on diuretics, 7/17 underwent cardiac cath cardiac cath which noted stable coronary disease, moderately reduced cardiac output and prominent V wave and manage pressures suggesting significant MR, TEE 7/18 noted severe mitral regurgitation  -Seen by structural heart team 7/18, plan for mitral valve clip -7/19-elevated filling pressures, IABP placed -7/20 TEER w/ mitral clip placement -Dr.Cooper   Subjective: -Feels okay, no events overnight, tolerated mitral clip yesterday  Assessment and Plan:  Acute on chronic systolic (congestive) heart failure (HCC) Severe mitral regurgitation 04/23 echo with LV systolic function 30 to 35%, global hypokinesis, preserved RV systolic function, severe enlargement of pulmonary artery with RVSP 64,0 mmHg. Severe LA enlargement. Severe mitral regurgitation,  -Mixed cardiomyopathy, cardiac MRI 09/2009 with EF of 31%, inferior scar -Considered end-stage heart failure, inotrope dependent on home dobutamine -Not a candidate for advanced therapies, no support system, not on citizen status etc. -Right heart cath/LHC this admission noted stable one-vessel disease, moderate pulmonary hypertension, moderately reduced CO on dobutamine and severe MR, -TEE 7/18 -severe MR, structural heart team consulted for mitral valve clip -Per heart failure team, balloon pump placed 7/19, dobutamine discontinued, now on milrinone, s/p TEER with mitral clip yesterday 7/20 repeat echo today with improvement in MR, plan to wean  balloon pump  Coronary artery disease involving native coronary artery of native heart without angina pectoris Continue with aspirin and statin.  -LHC with one-vessel CAD, chronically occluded RCA  AKI on CKD 3a -Baseline creatinine around 1.6,  -Creatinine has normalized on inotropes  Acute on chronic respiratory failure with hypoxia (HCC) Patient with severe pulmonary hypertension. -Improving, on 2 L home O2  Frequent PVCs -Remains on amiodarone  Goals of care, counseling/discussion Followed by hospice , now w/ advanced cardiac interventions Code status is DNR    DVT prophylaxis: Lovenox Code Status: DNR Family Communication: Discussed with patient detail, no family at bedside Disposition Plan: To be determined  Consultants: Advanced heart failure team   Procedures: 7/17 RHC/LHC - on DBA 5. Stable 1V CAD, moderate mixed pulmonary HTN, moderately reduced CO on DBA 5, prominent v waves in PCWP tracing suggestive of significant MR   TEE 7/19  LVEF 25% severe central MR. RV moderately down.   Antimicrobials:    Objective: Vitals:   04/16/22 1015 04/16/22 1030 04/16/22 1045 04/16/22 1100  BP:    (!) 85/61  Pulse: 76 76 77 84  Resp: 15 14 12 17   Temp: (!) 97.5 F (36.4 C) 97.7 F (36.5 C) 97.9 F (36.6 C) 97.9 F (36.6 C)  TempSrc:      SpO2: 97% 95% 96% 100%  Weight:      Height:        Intake/Output Summary (Last 24 hours) at 04/16/2022 1127 Last data filed at 04/16/2022 1000 Gross per 24 hour  Intake 2179.17 ml  Output 1755 ml  Net 424.17 ml   Filed Weights   04/14/22 0417 04/15/22 0500 04/16/22 0500  Weight: 63.4 kg 63.8 kg 63.1 kg    Examination:  General exam: Chronically ill male laying in  bed, AAOx3, no distress  HEENT: Left IJ, right IJ Swan CVS: S1-S2, regular rhythm, systolic murmur Lungs: Clear in anterior lung fields Abdomen: Soft, nontender, bowel sounds present Extremities: No edema chronically ill young male sitting up in bed, AAOx3,  no distress  Psychiatry:  Mood & affect appropriate.     Data Reviewed:   CBC: Recent Labs  Lab 04/12/22 0450 04/12/22 1350 04/14/22 1039 04/14/22 1040 04/15/22 0448 04/15/22 2125 04/16/22 0427  WBC 12.7*  --   --   --  9.5 11.6* 9.3  HGB 12.0*   < > 12.2* 9.5* 10.6* 10.3* 9.7*  HCT 36.6*   < > 36.0* 28.0* 33.6* 33.6* 30.4*  MCV 85.7  --   --   --  88.7 88.7 87.9  PLT 196  --   --   --  160 120* 112*   < > = values in this interval not displayed.   Basic Metabolic Panel: Recent Labs  Lab 04/12/22 0450 04/12/22 1350 04/13/22 0421 04/14/22 0634 04/14/22 1031 04/14/22 1039 04/14/22 1040 04/15/22 0448 04/15/22 2125 04/16/22 0427  NA 144   < > 142 144   < > 149* 158* 145 150* 146*  K 4.2   < > 4.4 3.9   < > 3.6 2.5* 3.4* 3.3* 4.3  CL 112*  --  112* 112*  --   --   --  113* 113* 115*  CO2 24  --  25 27  --   --   --  25 26 27   GLUCOSE 130*  --  255* 197*  --   --   --  119* 106* 86  BUN 33*  --  35* 38*  --   --   --  33* 25* 22*  CREATININE 1.49*  --  1.52* 1.73*  --   --   --  1.41* 1.12 1.06  CALCIUM 8.7*  --  8.8* 8.6*  --   --   --  7.7* 7.9* 7.6*  MG 2.6*  --  2.6*  --   --   --   --   --   --   --   PHOS  --   --  3.8  --   --   --   --   --   --   --    < > = values in this interval not displayed.   GFR: Estimated Creatinine Clearance: 67 mL/min (by C-G formula based on SCr of 1.06 mg/dL). Liver Function Tests: No results for input(s): "AST", "ALT", "ALKPHOS", "BILITOT", "PROT", "ALBUMIN" in the last 168 hours.  No results for input(s): "LIPASE", "AMYLASE" in the last 168 hours. No results for input(s): "AMMONIA" in the last 168 hours. Coagulation Profile: Recent Labs  Lab 04/15/22 0448  INR 1.3*   Cardiac Enzymes: No results for input(s): "CKTOTAL", "CKMB", "CKMBINDEX", "TROPONINI" in the last 168 hours. BNP (last 3 results) No results for input(s): "PROBNP" in the last 8760 hours. HbA1C: No results for input(s): "HGBA1C" in the last 72  hours. CBG: No results for input(s): "GLUCAP" in the last 168 hours. Lipid Profile: No results for input(s): "CHOL", "HDL", "LDLCALC", "TRIG", "CHOLHDL", "LDLDIRECT" in the last 72 hours. Thyroid Function Tests: No results for input(s): "TSH", "T4TOTAL", "FREET4", "T3FREE", "THYROIDAB" in the last 72 hours. Anemia Panel: No results for input(s): "VITAMINB12", "FOLATE", "FERRITIN", "TIBC", "IRON", "RETICCTPCT" in the last 72 hours. Urine analysis:    Component Value Date/Time   COLORURINE YELLOW 03/05/2020 0557  APPEARANCEUR CLEAR 03/05/2020 0557   LABSPEC 1.025 03/05/2020 0557   PHURINE 5.5 03/05/2020 0557   GLUCOSEU NEGATIVE 03/05/2020 0557   HGBUR TRACE (A) 03/05/2020 0557   BILIRUBINUR NEGATIVE 03/05/2020 0557   KETONESUR NEGATIVE 03/05/2020 0557   PROTEINUR 100 (A) 03/05/2020 0557   UROBILINOGEN 0.2 06/24/2009 1116   NITRITE NEGATIVE 03/05/2020 0557   LEUKOCYTESUR NEGATIVE 03/05/2020 0557   Sepsis Labs: @LABRCNTIP (procalcitonin:4,lacticidven:4)  ) Recent Results (from the past 240 hour(s))  SARS Coronavirus 2 by RT PCR (hospital order, performed in [facility] hospital lab) *cepheid single result test* Anterior Nasal Swab     Status: None   Collection Time: 04/08/22  8:55 PM   Specimen: Anterior Nasal Swab  Result Value Ref Range Status   SARS Coronavirus 2 by RT PCR NEGATIVE NEGATIVE Final    Comment: (NOTE) SARS-CoV-2 target nucleic acids are NOT DETECTED.  The SARS-CoV-2 RNA is generally detectable in upper and lower respiratory specimens during the acute phase of infection. The lowest concentration of SARS-CoV-2 viral copies this assay can detect is 250 copies / mL. A negative result does not preclude SARS-CoV-2 infection and should not be used as the sole basis for treatment or other patient management decisions.  A negative result may occur with improper specimen collection / handling, submission of specimen other than nasopharyngeal swab, presence of viral  mutation(s) within the areas targeted by this assay, and inadequate number of viral copies (<250 copies / mL). A negative result must be combined with clinical observations, patient history, and epidemiological information.  Fact Sheet for Patients:   https://www.patel.info/  Fact Sheet for Healthcare Providers: https://hall.com/  This test is not yet approved or  cleared by the [place] FDA and has been authorized for detection and/or diagnosis of SARS-CoV-2 by FDA under an Emergency Use Authorization (EUA).  This EUA will remain in effect (meaning this test can be used) for the duration of the COVID-19 declaration under Section 564(b)(1) of the Act, 21 U.S.C. section 360bbb-3(b)(1), unless the authorization is terminated or revoked sooner.  Performed at [facility], [place] [address], [place], [place] [address]   MRSA Next Gen by PCR, Nasal     Status: None   Collection Time: 04/14/22 11:39 AM   Specimen: Nasal Mucosa; Nasal Swab  Result Value Ref Range Status   MRSA by PCR Next Gen NOT DETECTED NOT DETECTED Final    Comment: (NOTE) The GeneXpert MRSA Assay (FDA approved for NASAL specimens only), is one component of a comprehensive MRSA colonization surveillance program. It is not intended to diagnose MRSA infection nor to guide or monitor treatment for MRSA infections. Test performance is not FDA approved in patients less than 66 years old. Performed at [facility], [place] [address] [place], [place] [address]      Radiology Studies: ECHOCARDIOGRAM COMPLETE  Result Date: 04/16/2022    ECHOCARDIOGRAM REPORT   Patient Name:   Zachary Arias Date of Exam: 04/16/2022 Medical Rec #:  [id number]     Height:       68.0 in Accession #:    [id number]    Weight:       139.1 lb Date of Birth:  10/16/1960     BSA:          1.752 m Patient Age:    89 years      BP:           98/58 mmHg Patient Gender: M  HR:           86  bpm. Exam Location:  Inpatient Procedure: 2D Echo, Cardiac Doppler and Color Doppler Indications:    S/P Mitral valve clip implantation  History:        Patient has prior history of Echocardiogram examinations, most                 recent 04/10/2022. CAD; Risk Factors:Hypertension.  Sonographer:    Jefferey Pica Referring Phys: [phone] JILL D MCDANIEL IMPRESSIONS  1. Left ventricular ejection fraction, by estimation, is 20 to 25%. The left ventricle has severely decreased function. The left ventricle demonstrates global hypokinesis. The left ventricular internal cavity size was severely dilated. Left ventricular diastolic parameters are indeterminate.  2. Right ventricular systolic function is normal. The right ventricular size is normal. There is mildly elevated pulmonary artery systolic pressure.  3. Left atrial size was moderately dilated.  4. Post mitral clip procedure with 2 XTW clips straddling A2/P2 Mean gradient only 3 peak 7 mmHg when patient in atrial bigeminy recorded HR not accurate due to ectopy. Mild to moderate residual MR Jet eminates lateral to clips . The mitral valve has been repaired/replaced. No evidence of mitral valve regurgitation. No evidence of mitral stenosis.  5. Tricuspid valve regurgitation is severe.  6. The aortic valve is tricuspid. Aortic valve regurgitation is not visualized. No aortic stenosis is present.  7. The inferior vena cava is normal in size with greater than 50% respiratory variability, suggesting right atrial pressure of 3 mmHg. FINDINGS  Left Ventricle: Left ventricular ejection fraction, by estimation, is 20 to 25%. The left ventricle has severely decreased function. The left ventricle demonstrates global hypokinesis. The left ventricular internal cavity size was severely dilated. There is no left ventricular hypertrophy. Left ventricular diastolic parameters are indeterminate. Right Ventricle: The right ventricular size is normal. No increase in right ventricular wall  thickness. Right ventricular systolic function is normal. There is mildly elevated pulmonary artery systolic pressure. The tricuspid regurgitant velocity is 2.56  m/s, and with an assumed right atrial pressure of 15 mmHg, the estimated right ventricular systolic pressure is [id number] mmHg. Left Atrium: Left atrial size was moderately dilated. Right Atrium: Right atrial size was normal in size. Pericardium: There is no evidence of pericardial effusion. Mitral Valve: Post mitral clip procedure with 2 XTW clips straddling A2/P2 Mean gradient only 3 peak 7 mmHg when patient in atrial bigeminy recorded HR not accurate due to ectopy. Mild to moderate residual MR Jet eminates lateral to clips. The mitral valve has been repaired/replaced. No evidence of mitral valve regurgitation. No evidence of mitral valve stenosis. MV peak gradient, 7.2 mmHg. The mean mitral valve gradient is 3.5 mmHg. Tricuspid Valve: The tricuspid valve is normal in structure. Tricuspid valve regurgitation is severe. No evidence of tricuspid stenosis. Aortic Valve: The aortic valve is tricuspid. Aortic valve regurgitation is not visualized. No aortic stenosis is present. Aortic valve peak gradient measures 5.0 mmHg. Pulmonic Valve: The pulmonic valve was normal in structure. Pulmonic valve regurgitation is mild. No evidence of pulmonic stenosis. Aorta: The aortic root is normal in size and structure. Venous: The inferior vena cava is normal in size with greater than 50% respiratory variability, suggesting right atrial pressure of 3 mmHg. IAS/Shunts: No atrial level shunt detected by color flow Doppler.  LEFT VENTRICLE PLAX 2D LVIDd:         6.20 cm LVIDs:         5.70 cm LV PW:  1.20 cm LV IVS:        0.90 cm LVOT diam:     2.00 cm LV SV:         56 LV SV Index:   32 LVOT Area:     3.14 cm  RIGHT VENTRICLE             IVC RV Basal diam:  3.50 cm     IVC diam: 2.40 cm RV S prime:     11.90 cm/s TAPSE (M-mode): 1.8 cm LEFT ATRIUM              Index          RIGHT ATRIUM           Index LA diam:        3.90 cm  2.23 cm/m    RA Area:     20.20 cm LA Vol (A2C):   200.0 ml 114.19 ml/m  RA Volume:   57.20 ml  32.66 ml/m LA Vol (A4C):   104.0 ml 59.38 ml/m LA Biplane Vol: 156.0 ml 89.07 ml/m  AORTIC VALVE                 PULMONIC VALVE AV Area (Vmax): 3.35 cm     PV Vmax:       0.73 m/s AV Vmax:        111.67 cm/s  PV Peak grad:  2.1 mmHg AV Peak Grad:   5.0 mmHg LVOT Vmax:      119.00 cm/s LVOT Vmean:     73.500 cm/s LVOT VTI:       0.178 m  AORTA Ao Root diam: 2.80 cm Ao Asc diam:  3.20 cm MITRAL VALVE                TRICUSPID VALVE MV Area (PHT): 1.88 cm     TR Peak grad:   26.2 mmHg MV Area VTI:   1.40 cm     TR Vmax:        256.00 cm/s MV Peak grad:  7.2 mmHg MV Mean grad:  3.5 mmHg     SHUNTS MV Vmax:       1.35 m/s     Systemic VTI:  0.18 m MV Vmean:      86.2 cm/s    Systemic Diam: 2.00 cm MV Decel Time: 404 msec MR Peak grad: 45.7 mmHg MR Vmax:      338.00 cm/s MV E velocity: 116.00 cm/s MV A velocity: 117.00 cm/s MV E/A ratio:  0.99 Jenkins Rouge MD Electronically signed by Jenkins Rouge MD Signature Date/Time: 04/16/2022/11:00:50 AM    Final    DG CHEST PORT 1 VIEW  Result Date: 04/16/2022 CLINICAL DATA:  Chest pain. EXAM: PORTABLE CHEST 1 VIEW COMPARISON:  April 14, 2022. FINDINGS: Stable cardiomegaly. Left internal jugular catheter is now noted. Right internal jugular Swan-Ganz catheter is again noted with tip directed into right pulmonary artery. Distal tip of aortic balloon catheter appears to be in the expected position of the proximal descending thoracic aorta. No pneumothorax is noted. Right-sided PICC line is noted. Lungs are clear. Bony thorax is unremarkable. IMPRESSION: Interval placement of left internal jugular catheter with distal tip in expected position of the SVC. Swan-Ganz catheter is unchanged. Distal tip of aortic balloon catheter seen in expected position of proximal descending thoracic aorta. Electronically Signed   By: Marijo Conception M.D.   On: 04/16/2022 08:36   CARDIAC CATHETERIZATION  Result Date: 04/15/2022 Successful  transcatheter edges repair of the mitral valve using 2 MitraClip G4 XT W devices, first device positioned lateral side of A2/P2 and second device position the medial side of A2/P2 Recommend: Postoperative day #1 echo tomorrow Keep IABP in place overnight (discussed with Dr. Haroldine Laws) Aspirin 81 mg daily for at least 3 months   ECHO TEE  Result Date: 04/15/2022    TRANSESOPHOGEAL ECHO REPORT   Patient Name:   Zachary Arias Date of Exam: 04/15/2022 Medical Rec #:  [id number]     Height:       68.0 in Accession #:    [id number]    Weight:       140.7 lb Date of Birth:  December 01, 1960     BSA:          1.760 m Patient Age:    71 years      BP:           113/80 mmHg Patient Gender: M             HR:           83 bpm. Exam Location:  Inpatient Procedure: 3D Echo, Cardiac Doppler, Color Doppler and Transesophageal Echo Indications:    mitral valve insufficiency  History:        Patient has prior history of Echocardiogram examinations, most                 recent 04/10/2022. CHF, CAD; Risk Factors:Hypertension. Mitral                 Clip's implanted on 04/15/22, XTW and XTW.  Sonographer:    Darlina Sicilian RDCS Referring Phys: [id number] DANIEL R BENSIMHON PROCEDURE: The transesophogeal probe was passed without difficulty through the esophogus of the patient. Sedation performed by different physician. The patient developed no complications during the procedure. IMPRESSIONS  1. Intervential TEE for MitraClip.  2. Prior to procedure, there was severe ventricular functional mitral regurgitation (torrential). Regurgitant fraction 68% by 2D PISA, with right pulmonary vein systolic flow reversal. Mitral valve area 5.53 cm2.  3. Mid-mid transeptal puncture performed with no complication.  4. Attempt was made for NTW TEER intervention. Unable to grasp anterior and posterior leaflets.  5. Placement of an XTW Mitra-Clip in the lateral  position. Mean gradient of 5 mm Hg at a heart rate of 89 bpm. Improved mitral regugitation, still severe. Double orifice area with combined valve area 4.15 cm2.  6. Placement of an XTW Mitra-Clip in the medial position. Mean gradient of 6 mm Hg at a heart rate of 84 bpm. Mild to moderate residual mitral regurgitation (+2). Resolution of right pulmonary vein systolic flow reversal. Triple orifice area combined valve area 3.3 cm2. Successful mTEER intervention.  7. Left to right shunting post procedure. No procedural pericardial effusion.  8. Left ventricular ejection fraction, by estimation, is 25 to 30%. The left ventricle has severely decreased function. The left ventricular internal cavity size was severely dilated.  9. Right ventricular systolic function is normal. The right ventricular size is normal. 10. Left atrial size was severely dilated. No left atrial/left atrial appendage thrombus was detected. 11. Right atrial size was severely dilated. 12. Tricuspid valve regurgitation is mild to moderate. 13. The aortic valve is tricuspid. Aortic valve regurgitation is trivial. No aortic stenosis is present. FINDINGS  Left Ventricle: Left ventricular ejection fraction, by estimation, is 25 to 30%. The left ventricle has severely decreased function. The left ventricular internal cavity size was severely dilated. Right Ventricle: The  right ventricular size is normal. Right vetricular wall thickness was not well visualized. Right ventricular systolic function is normal. Left Atrium: Left atrial size was severely dilated. No left atrial/left atrial appendage thrombus was detected. Right Atrium: Right atrial size was severely dilated. Pericardium: Trivial pericardial effusion is present. Mitral Valve: The mitral valve is grossly normal. Severe mitral valve regurgitation. MV peak gradient, 8.0 mmHg. The mean mitral valve gradient is 3.5 mmHg. Tricuspid Valve: The tricuspid valve is normal in structure. Tricuspid valve  regurgitation is mild to moderate. No evidence of tricuspid stenosis. Aortic Valve: The aortic valve is tricuspid. Aortic valve regurgitation is trivial. No aortic stenosis is present. Pulmonic Valve: The pulmonic valve was normal in structure. Pulmonic valve regurgitation is mild. Aorta: The aortic root is normal in size and structure. IAS/Shunts: Evidence of atrial level shunting detected by color flow Doppler.  MITRAL VALVE MV Area (plan): 4.93 cm MV Peak grad:   8.0 mmHg MV Mean grad:   3.5 mmHg MV Vmax:        1.42 m/s MV Vmean:       83.9 cm/s MR Peak grad:    92.4 mmHg MR Mean grad:    71.0 mmHg MR Vmax:         480.50 cm/s MR Vmean:        407.0 cm/s MR PISA:         0.57 cm MR PISA Eff ROA: 4 mm MR PISA Radius:  0.30 cm Rudean Haskell MD Electronically signed by Rudean Haskell MD Signature Date/Time: 04/15/2022/4:59:22 PM    Final    DG CHEST PORT 1 VIEW  Result Date: 04/14/2022 CLINICAL DATA:  Cardiogenic shock, shortness of breath EXAM: PORTABLE CHEST 1 VIEW COMPARISON:  04/09/2022 FINDINGS: 2 frontal views of the chest demonstrate an enlarged cardiac silhouette. Right-sided PICC tip overlies superior vena cava. Right internal jugular flow directed central venous catheter tip overlies right pulmonary artery. Metallic tip of an intra-aortic balloon pump is seen along the distal margin of the aortic arch at the T5-6 level. Mild diffuse interstitial prominence compatible with edema. No airspace disease, effusion, or pneumothorax. No acute bony abnormality. IMPRESSION: 1. Support devices as above. 2. Enlarged cardiac silhouette. 3. Mild interstitial edema. Electronically Signed   By: Randa Ngo M.D.   On: 04/14/2022 17:18     Scheduled Meds:  amiodarone  200 mg Oral BID   aspirin EC  81 mg Oral Daily   atorvastatin  40 mg Oral Daily   Chlorhexidine Gluconate Cloth  6 each Topical Daily   digoxin  0.0625 mg Oral Daily   sodium chloride flush  10-40 mL Intracatheter Q12H   sodium  chloride flush  3 mL Intravenous Q12H   sodium chloride flush  3 mL Intravenous Q12H   sodium chloride flush  3 mL Intravenous Q12H   spironolactone  12.5 mg Oral Daily   Continuous Infusions:  sodium chloride 10 mL/hr at 04/16/22 1000   sodium chloride 10 mL/hr at 04/15/22 1200   sodium chloride     heparin 900 Units/hr (04/16/22 1000)   milrinone 0.25 mcg/kg/min (04/16/22 1000)     LOS: 8 days    Time spent: 37min    Domenic Polite, MD [facility]   04/16/2022, 11:27 AM

## 2022-04-16 NOTE — Progress Notes (Signed)
71fr IABP left femoral arterial sheath removed.  IABP was placed in standby and balloon was pulled down to sheath.  Sheath and balloon removed as one unit.  Site allowed to bleed after balloon removed before manual pressure applied.  Manual pressure held for 30 minutes, hemostasis achieved.  Site level 0, tegaderm and gauze dressing applied.  Left DP present +2.  Instructions given to pt.  Bedrest begins at 16:00

## 2022-04-16 NOTE — Progress Notes (Signed)
ANTICOAGULATION CONSULT NOTE   Pharmacy Consult for heparin Indication:  IABP  Allergies  Allergen Reactions   Avocado Nausea Only   Shellfish Allergy Nausea And Vomiting   Patient Measurements: Height: 5\' 8"  (172.7 cm) Weight: 63.1 kg (139 lb 1.8 oz) IBW/kg (Calculated) : 68.4 kg Heparin Dosing Weight: 63 kg  Vital Signs: Temp: 97.7 F (36.5 C) (07/21 1200) Temp Source: Core (07/21 0801) BP: 98/68 (07/21 1200) Pulse Rate: 82 (07/21 1200)  Labs: Recent Labs    04/14/22 2223 04/15/22 0448 04/15/22 2125 04/16/22 0427 04/16/22 0748  HGB  --  10.6* 10.3* 9.7*  --   HCT  --  33.6* 33.6* 30.4*  --   PLT  --  160 120* 112*  --   LABPROT  --  15.9*  --   --   --   INR  --  1.3*  --   --   --   HEPARINUNFRC 0.24* 0.29*  --   --  0.34  CREATININE  --  1.41* 1.12 1.06  --     Estimated Creatinine Clearance: 67 mL/min (by C-G formula based on SCr of 1.06 mg/dL).  Medical History: Past Medical History:  Diagnosis Date   Congestive heart failure (CHF) (HCC)    secondary to ischemic cardiomyopathy with an ejection fraction of 25-30%   Coronary artery disease    History of hypertension    Inguinal hernia 09/28/2003   History of left inguinal hernia, status post repair in 2005   S/P mitral valve clip implantation 04/15/2022   Mitraclip XTW x2 with Dr. 04/17/2022 and Dr. Excell Seltzer   Tachycardia 09/27/2008    Unexplained tachycardia during hospitalization   Assessment: 61 year old male s/p mitral valve repair on 7/20 and s/p RHC and IABP insertion on 7/19.   Patient s/p mitral clip 7/20. Planning to remove IABP today. Heparin now turned off.   Goal of Therapy:  Heparin level 0.2-0.5 units/ml Monitor platelets by anticoagulation protocol: Yes   Plan:  Follow while in ICU  8/20 PharmD., BCPS Clinical Pharmacist 04/16/2022 12:10 PM

## 2022-04-17 DIAGNOSIS — I5023 Acute on chronic systolic (congestive) heart failure: Secondary | ICD-10-CM | POA: Diagnosis not present

## 2022-04-17 LAB — CBC
HCT: 30.1 % — ABNORMAL LOW (ref 39.0–52.0)
Hemoglobin: 9.5 g/dL — ABNORMAL LOW (ref 13.0–17.0)
MCH: 27.7 pg (ref 26.0–34.0)
MCHC: 31.6 g/dL (ref 30.0–36.0)
MCV: 87.8 fL (ref 80.0–100.0)
Platelets: 104 10*3/uL — ABNORMAL LOW (ref 150–400)
RBC: 3.43 MIL/uL — ABNORMAL LOW (ref 4.22–5.81)
RDW: 17.5 % — ABNORMAL HIGH (ref 11.5–15.5)
WBC: 8.9 10*3/uL (ref 4.0–10.5)
nRBC: 0 % (ref 0.0–0.2)

## 2022-04-17 LAB — BASIC METABOLIC PANEL
Anion gap: 5 (ref 5–15)
BUN: 19 mg/dL (ref 6–20)
CO2: 22 mmol/L (ref 22–32)
Calcium: 7.4 mg/dL — ABNORMAL LOW (ref 8.9–10.3)
Chloride: 111 mmol/L (ref 98–111)
Creatinine, Ser: 1 mg/dL (ref 0.61–1.24)
GFR, Estimated: >60 mL/min (ref >60)
Glucose, Bld: 110 mg/dL — ABNORMAL HIGH (ref 70–99)
Potassium: 3.8 mmol/L (ref 3.5–5.1)
Sodium: 138 mmol/L (ref 135–145)

## 2022-04-17 LAB — COOXEMETRY PANEL
Carboxyhemoglobin: 2.2 % — ABNORMAL HIGH (ref 0.5–1.5)
Methemoglobin: <0.7 % (ref 0.0–1.5)
O2 Saturation: 60 %
Total hemoglobin: 9.2 g/dL — ABNORMAL LOW (ref 12.0–16.0)

## 2022-04-17 MED ORDER — POTASSIUM CHLORIDE CRYS ER 20 MEQ PO TBCR
40.0000 meq | EXTENDED_RELEASE_TABLET | Freq: Once | ORAL | Status: AC
Start: 2022-04-17 — End: 2022-04-17
  Administered 2022-04-17: 40 meq via ORAL
  Filled 2022-04-17: qty 2

## 2022-04-17 MED ORDER — ENOXAPARIN SODIUM 40 MG/0.4ML IJ SOSY
40.0000 mg | PREFILLED_SYRINGE | INTRAMUSCULAR | Status: DC
  Administered 2022-04-17 – 2022-04-20 (×4): 40 mg via SUBCUTANEOUS
  Filled 2022-04-17 (×4): qty 0.4

## 2022-04-17 MED ORDER — DAPAGLIFLOZIN PROPANEDIOL 10 MG PO TABS
10.0000 mg | ORAL_TABLET | Freq: Every day | ORAL | Status: DC
  Administered 2022-04-17 – 2022-04-20 (×4): 10 mg via ORAL
  Filled 2022-04-17 (×4): qty 1

## 2022-04-17 NOTE — Op Note (Signed)
PROCEDURE:  Transcatheter edge to edge mitral valve repair (TEER) INDICATION: Severe symptomatic mitral regurgitation (Stage D)  SURGEON:  Tonny Bollman, MD CO-SURGEON: Alverda Skeans, MD  PROCEDURAL DETAILS: General anesthesia is induced.  The patient is prepped and draped.  Baseline transesophageal echo images are obtained and confirm appropriate anatomy for transcatheter edge-to-edge mitral valve repair.  Using vascular ultrasound guidance, the right common femoral vein is accessed via a front wall puncture.  2 Perclose sutures are deployed and an 8 [place] sheath is inserted.  A versa cross wire is advanced into the SVC.  Heparin is administered and a therapeutic ACT is achieved.  Transseptal puncture is performed over the mid posterior portion of the fossa.  The septum is dilated while the MitraClip steerable guide catheter is prepped.  After progressively dilating the right common femoral vein, the 24 French MitraClip steerable guide catheter is inserted and advanced across the interatrial septum.  The dilator and the versa cross wire were carefully removed and the guide tip is appropriately positioned approximately 3 cm across the interatrial septum.  A MitraClip XTW device is prepped per protocol.  The device is inserted through the steerable guide catheter with caution taken to avoid air entrapment.  The MitraClip device is positioned above the mitral valve after applying appropriate curve to the guide catheter.  The MitraClip device is then oriented coaxially with the anterior and posterior leaflets of the mitral valve.  Low tidal volume ventilation is initiated.  The clip device is advanced across the mitral valve and pulled back until capture of both the anterior and posterior leaflets is achieved.  Grippers are dropped and the clip is closed under TEE guidance.  Careful TEE assessment is performed and reduction in mitral valve regurgitation is felt to be appropriate.  Leaflet insertion is  verified using 3D imaging.  The MitraClip device is deployed using normal technique and the clip delivery system is removed.  After full TEE assessment, the procedural result is felt to be adequate with reduction of mitral regurgitation to 3+.   Second clip  A MitraClip XTW device is prepped per protocol.  The device is inserted through the steerable guide catheter with caution taken to avoid air entrapment.  The MitraClip device is positioned above the mitral valve after applying appropriate curve to the guide catheter.  The MitraClip device is then oriented coaxially with the anterior and posterior leaflets of the mitral valve.  Low tidal volume ventilation is initiated.  The clip device is advanced across the mitral valve and pulled back until capture of both the anterior and posterior leaflets is achieved.  Grippers are dropped and the clip is closed under TEE guidance.  Careful TEE assessment is performed and reduction in mitral valve regurgitation is felt to be appropriate.  Leaflet insertion is verified using 3D imaging.  The MitraClip device is deployed using normal technique and the clip delivery system is removed.  After full TEE assessment, the procedural result is felt to be adequate with reduction of mitral regurgitation to 2+.   PROCEDURE COMPLETION: The steerable guide catheter is pulled back into the right atrium and the interatrial septum is assessed with TEE.  There is no significant septal injury seen and no right to left shunting.  The guide catheter is removed and the Perclose sutures are tightened.  Protamine is administered.  CONCLUSION: Successful transcatheter edge-to-edge mitral valve repair under fluoroscopic and echo guidance, reducing baseline 4+ mitral regurgitation to 2+, clip positioned A2/P2 (lateral and  medial aspects).  Orbie Pyo 04/17/2022 7:06 AM

## 2022-04-17 NOTE — Progress Notes (Signed)
Advanced Heart Failure Rounding Note  PCP-Cardiologist: Dr. Gala Romney   Subjective:    Echo 04/10/22 EF 25-30% Severe MR/TR. Moderate RV HK   7/17 RHC/LHC - on DBA 5. Stable 1V CAD, moderate mixed pulmonary HTN, moderately reduced CO on DBA 5, prominent v waves in PCWP tracing suggestive of significant MR 7/19 IABP and swan placed for optimization. PAPi Switched from DBA to Milrinone 0.375 mcg. 7/20 S/P mTEER 7/21 IABP out  Feels ok.   IABP out yesterday. On milrinone 0.375 CVP 9 PA 46/19 Thermo 5.6/3.2 Co-ox 60%   Objective:   Weight Range: 63.1 kg Body mass index is 21.15 kg/m.   Vital Signs:   Temp:  [96.8 F (36 C)-99 F (37.2 C)] 97.3 F (36.3 C) (07/22 0730) Pulse Rate:  [64-115] 75 (07/22 0730) Resp:  [10-26] 24 (07/22 0730) BP: (83-114)/(49-91) 104/72 (07/22 0730) SpO2:  [72 %-100 %] 100 % (07/22 0730) Arterial Line BP: (89-120)/(53-75) 93/53 (07/21 1515) Last BM Date : 04/15/22  Weight change: Filed Weights   04/14/22 0417 04/15/22 0500 04/16/22 0500  Weight: 63.4 kg 63.8 kg 63.1 kg    Intake/Output:   Intake/Output Summary (Last 24 hours) at 04/17/2022 1033 Last data filed at 04/17/2022 0400 Gross per 24 hour  Intake 762.62 ml  Output 800 ml  Net -37.38 ml       Physical Exam   General:  Sitting up in bed. No resp difficulty HEENT: normal Neck: supple. RIJ swan. Carotids 2+ bilat; no bruits. No lymphadenopathy or thryomegaly appreciated. Cor: PMI nondisplaced. Regular rate & rhythm. No rubs, gallops or murmurs. Lungs: clear Abdomen: soft, nontender, nondistended. No hepatosplenomegaly. No bruits or masses. Good bowel sounds. Extremities: no cyanosis, clubbing, rash, edema Neuro: alert & orientedx3, cranial nerves grossly intact. moves all 4 extremities w/o difficulty. Affect pleasant   Telemetry   SR 70-80s Personally reviewed  Labs    CBC Recent Labs    04/16/22 0427 04/17/22 0323  WBC 9.3 8.9  HGB 9.7* 9.5*  HCT 30.4*  30.1*  MCV 87.9 87.8  PLT 112* 104*    Basic Metabolic Panel Recent Labs    03/27/15 0427 04/17/22 0323  NA 146* 138  K 4.3 3.8  CL 115* 111  CO2 27 22  GLUCOSE 86 110*  BUN 22* 19  CREATININE 1.06 1.00  CALCIUM 7.6* 7.4*    Liver Function Tests No results for input(s): "AST", "ALT", "ALKPHOS", "BILITOT", "PROT", "ALBUMIN" in the last 72 hours. No results for input(s): "LIPASE", "AMYLASE" in the last 72 hours. Cardiac Enzymes No results for input(s): "CKTOTAL", "CKMB", "CKMBINDEX", "TROPONINI" in the last 72 hours.  BNP: BNP (last 3 results) Recent Labs    02/12/22 1432 03/09/22 1540 04/08/22 2055  BNP 562.1* 503.3* 3,236.0*     ProBNP (last 3 results) No results for input(s): "PROBNP" in the last 8760 hours.   D-Dimer No results for input(s): "DDIMER" in the last 72 hours. Hemoglobin A1C No results for input(s): "HGBA1C" in the last 72 hours. Fasting Lipid Panel No results for input(s): "CHOL", "HDL", "LDLCALC", "TRIG", "CHOLHDL", "LDLDIRECT" in the last 72 hours. Thyroid Function Tests No results for input(s): "TSH", "T4TOTAL", "T3FREE", "THYROIDAB" in the last 72 hours.  Invalid input(s): "FREET3"  Other results:   Imaging    No results found.   Medications:     Scheduled Medications:  amiodarone  200 mg Oral BID   aspirin EC  81 mg Oral Daily   atorvastatin  40 mg Oral Daily  Chlorhexidine Gluconate Cloth  6 each Topical Daily   digoxin  0.0625 mg Oral Daily   potassium chloride  40 mEq Oral Once   sodium chloride flush  10-40 mL Intracatheter Q12H   sodium chloride flush  3 mL Intravenous Q12H   sodium chloride flush  3 mL Intravenous Q12H   sodium chloride flush  3 mL Intravenous Q12H   spironolactone  12.5 mg Oral Daily    Infusions:  sodium chloride 10 mL/hr at 04/17/22 0400   sodium chloride 10 mL/hr at 04/15/22 1200   sodium chloride     milrinone 0.25 mcg/kg/min (04/17/22 0400)    PRN Medications: sodium chloride,  sodium chloride, sodium chloride, acetaminophen **OR** acetaminophen, hydrALAZINE, labetalol, LORazepam, ondansetron **OR** ondansetron (ZOFRAN) IV, polyethylene glycol, sodium chloride flush, sodium chloride flush, sodium chloride flush    Patient Profile   61 y.o. 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 Cath with 1-v CAD with chronically occluded RCA. MRI in January 2011 with EF 31% with inferior scar. Recent admit 4/23 for a/c CHF w/ low output, requiring DBA. He is end-stage and inotrope dependent, on home DBA. Not a good candidate for advanced therapies with lack of support system, and non-citizen status and does not want this. Has been followed by home hospice.   Presented to ED w/ CC of progressive dyspnea and increased supp O2 requirements.   BNP 3,236. CXR w/ vascular congestion.    Assessment/Plan   1. Acute on Chronic Systolic Heart Failure - Cath 1-v CAD chronically occluded RCA. MRI in January 2011 EF 31% with inferior scar.  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   - Echo (8/13): EF 45-50% .  - No f/u between to 2015-2021 - Echo (6/21): EF 20-25% severe central MR. Repeat cath 6/21 with stable CAD - Echo (4/22): EF 40-45% mild to mod MR RV mild HK  - Echo (4/23): EF 30-35%.   - Admit 4/23 with shock, started on DBA and lasix. Diuresed 20 lbs. Unable to wean DBA discharged on home DBA 5 mcg/kg/min with hospice care - Admitted 7/23 with a/c HF.Echo 04/10/22 EF 25-30% Severe MR/TR. Moderate RV HK  - RHC/LHC with stable 1V disease, moderate mixed pulmonary HTN, moderately reduced CO on DBA 5, and prominent v waves in PCWP tracing suggestive of significant MR - Able to obtain Medicaid coverage - 7/19 had IABP + swan placed. PA pressures elevated on DBA. Switched to Milrinone.0.375 mcg. - Felt not to be candidate for VAD/transplant due to compliance issues  and lack of caregiver support -> underwent mTEER on 7/20 with excellent result - IABP pulled 7/21 - Hemodynamics look good. Cut milrinone to 0.25 - Continue digoxin 0.0625 mg daily.  - Continue spiro 12.5 mg daily. - Add Farxiga - Consult CR  2. AKI on CKD Stage IIIa likely due to ATN/overdiuresis - Creatinine baseline 1.6 - 1.82 on admit>>1.00 - follow BMP    3. PVCs - Continue amio 200 mg bid. - K and Mag stable.   4. CAD  - Cath this admit stable with 1-v CAD with chronically occluded RCA - No s/s angina - Continue ASA and atorvastatin.   5. Transaminitis - suspect due to passive congestion vs low output (co-ox ok)  6. Severe MR/TR -S/P mTEER MR 4+ -> 2+   CRITICAL CARE Performed by: Arvilla Meres  Total critical care time: 40 minutes  Critical care time  was exclusive of separately billable procedures and treating other patients.  Critical care was necessary to treat or prevent imminent or life-threatening deterioration.  Critical care was time spent personally by me (independent of midlevel providers or residents) on the following activities: development of treatment plan with patient and/or surrogate as well as nursing, discussions with consultants, evaluation of patient's response to treatment, examination of patient, obtaining history from patient or surrogate, ordering and performing treatments and interventions, ordering and review of laboratory studies, ordering and review of radiographic studies, pulse oximetry and re-evaluation of patient's condition.   Length of Stay: [place], [place]  04/17/2022, 10:33 AM  Advanced Heart Failure Team Pager [phone] (M-F; 7a - 5p)  Please contact [facility] for night-coverage after hours (5p -7a ) and weekends on [email or web address]

## 2022-04-17 NOTE — Progress Notes (Signed)
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  Has been on continuous dobutamine infusion for low output heart failure since April, also followed by hospice. -In the ED volume overloaded, started on diuretics, 7/17 underwent cardiac cath cardiac cath which noted stable coronary disease, moderately reduced cardiac output and prominent V wave and manage pressures suggesting significant MR, TEE 7/18 noted severe mitral regurgitation  -Seen by structural heart team 7/18, plan for mitral valve clip -7/19-elevated filling pressures, IABP placed -7/20 TEER w/ mitral clip placement -Dr.Cooper -7/21 balloon pump removed   Subjective: -Balloon pump removed yesterday, and remains on milrinone, feels okay, denies any complaints  Assessment and Plan:  Acute on chronic systolic (congestive) heart failure (HCC) Severe mitral regurgitation 04/23 echo with LV systolic function 30 to [id number], global hypokinesis, preserved RV systolic function, severe enlargement of pulmonary artery with RVSP 64,0 mmHg. Severe LA enlargement. Severe mitral regurgitation,  -Mixed cardiomyopathy, cardiac MRI 09/2009 with EF of 31%, inferior scar -Considered end-stage heart failure, inotrope dependent on home dobutamine -Not a candidate for advanced therapies, no support system, not on citizen status etc. -Right heart cath/LHC this admission noted stable one-vessel disease, moderate pulmonary hypertension, moderately reduced CO on dobutamine and severe MR, -TEE 7/18 -severe MR, structural heart team consulted for mitral valve clip -Per heart failure team, balloon pump placed 7/19, dobutamine discontinued, now on milrinone, s/p TEER with mitral clip 7/20 with good  results -Balloon pump removed 7/21 -Remains on digoxin, Aldactone, milrinone being weaned -Per heart failure team  Coronary artery disease involving native coronary artery of native heart without angina pectoris Continue with aspirin and statin.  -LHC with one-vessel CAD, chronically occluded RCA  AKI on CKD 3a -Baseline creatinine around 1.6,  -Creatinine has normalized on inotropes  Acute on chronic respiratory failure with hypoxia (HCC) Patient with severe pulmonary hypertension. -Improving, on 2 L home O2  Frequent PVCs -Remains on amiodarone  Goals of care, counseling/discussion Followed by hospice , now w/ advanced cardiac interventions Code status is DNR    DVT prophylaxis: Add Lovenox Code Status: DNR Family Communication: Discussed with patient detail, no family at bedside Disposition Plan: To be determined  Consultants: Advanced heart failure team   Procedures: 7/17 RHC/LHC - on DBA 5. Stable 1V CAD, moderate mixed pulmonary HTN, moderately reduced CO on DBA 5, prominent v waves in PCWP tracing suggestive of significant MR   TEE 7/19  LVEF 25% severe central MR. RV moderately down.   Antimicrobials:    Objective: Vitals:   04/17/22 0930 04/17/22 1000 04/17/22 1030 04/17/22 1100  BP: 95/63 94/61 106/72 105/72  Pulse: 74 71 75 73  Resp: 17 15 17 17   Temp: (!) 97.3 F (36.3 C) (!) 97.3 F (36.3 C) (!) 97.5 F (36.4 C) 97.7 F (36.5 C)  TempSrc:      SpO2: 99% 98% 99% 100%  Weight:      Height:        Intake/Output Summary (Last 24 hours) at 04/17/2022 1128 Last data filed at 04/17/2022 1000 Gross per 24 hour  Intake 850.43 ml  Output 800 ml  Net 50.43 ml   Filed Weights   04/14/22 0417 04/15/22 0500 04/16/22 0500  Weight: 63.4 kg 63.8  kg 63.1 kg    Examination:  General exam: Chronically ill male laying in bed, AAOx3, no distress HEENT: Left IJ with old blood, right IJ Swan CVS: S1-S2, regular rhythm Lungs: Clear anteriorly Abdomen:  Soft, nontender, bowel sounds present Extremities: No edema Psychiatry:  Mood & affect appropriate.     Data Reviewed:   CBC: Recent Labs  Lab 04/12/22 0450 04/12/22 1350 04/14/22 1040 04/15/22 0448 04/15/22 2125 04/16/22 0427 04/17/22 0323  WBC 12.7*  --   --  9.5 11.6* 9.3 8.9  HGB 12.0*   < > 9.5* 10.6* 10.3* 9.7* 9.5*  HCT 36.6*   < > 28.0* 33.6* 33.6* 30.4* 30.1*  MCV 85.7  --   --  88.7 88.7 87.9 87.8  PLT 196  --   --  160 120* 112* 104*   < > = values in this interval not displayed.   Basic Metabolic Panel: Recent Labs  Lab 04/12/22 0450 04/12/22 1350 04/13/22 0421 04/14/22 [id number] 04/14/22 1031 04/14/22 1040 04/15/22 0448 04/15/22 2125 04/16/22 0427 04/17/22 0323  NA 144   < > 142 144   < > 158* 145 150* 146* 138  K 4.2   < > 4.4 3.9   < > 2.5* 3.4* 3.3* 4.3 3.8  CL 112*  --  112* 112*  --   --  113* 113* 115* 111  CO2 24  --  25 27  --   --  25 26 27 22   GLUCOSE 130*  --  255* 197*  --   --  119* 106* 86 110*  BUN 33*  --  35* 38*  --   --  33* 25* 22* 19  CREATININE 1.49*  --  1.52* 1.73*  --   --  1.41* 1.12 1.06 1.00  CALCIUM 8.7*  --  8.8* 8.6*  --   --  7.7* 7.9* 7.6* 7.4*  MG 2.6*  --  2.6*  --   --   --   --   --   --   --   PHOS  --   --  3.8  --   --   --   --   --   --   --    < > = values in this interval not displayed.   GFR: Estimated Creatinine Clearance: 71 mL/min (by C-G formula based on SCr of 1 mg/dL). Liver Function Tests: No results for input(s): "AST", "ALT", "ALKPHOS", "BILITOT", "PROT", "ALBUMIN" in the last 168 hours.  No results for input(s): "LIPASE", "AMYLASE" in the last 168 hours. No results for input(s): "AMMONIA" in the last 168 hours. Coagulation Profile: Recent Labs  Lab 04/15/22 0448  INR 1.3*   Cardiac Enzymes: No results for input(s): "CKTOTAL", "CKMB", "CKMBINDEX", "TROPONINI" in the last 168 hours. BNP (last 3 results) No results for input(s): "PROBNP" in the last 8760 hours. HbA1C: No results for  input(s): "HGBA1C" in the last 72 hours. CBG: No results for input(s): "GLUCAP" in the last 168 hours. Lipid Profile: No results for input(s): "CHOL", "HDL", "LDLCALC", "TRIG", "CHOLHDL", "LDLDIRECT" in the last 72 hours. Thyroid Function Tests: No results for input(s): "TSH", "T4TOTAL", "FREET4", "T3FREE", "THYROIDAB" in the last 72 hours. Anemia Panel: No results for input(s): "VITAMINB12", "FOLATE", "FERRITIN", "TIBC", "IRON", "RETICCTPCT" in the last 72 hours. Urine analysis:    Component Value Date/Time   COLORURINE YELLOW 03/05/2020 [place] 03/05/2020 0557   LABSPEC 1.025 03/05/2020 0557   PHURINE 5.5 03/05/2020 0557  GLUCOSEU NEGATIVE 03/05/2020 0557   HGBUR TRACE (A) 03/05/2020 [place] 03/05/2020 [place] 03/05/2020 0557   PROTEINUR 100 (A) 03/05/2020 0557   UROBILINOGEN 0.2 06/24/2009 1116   NITRITE NEGATIVE 03/05/2020 0557   LEUKOCYTESUR NEGATIVE 03/05/2020 0557   Sepsis Labs: @LABRCNTIP (procalcitonin:4,lacticidven:4)  ) Recent Results (from the past 240 hour(s))  SARS Coronavirus 2 by RT PCR (hospital order, performed in [facility] hospital lab) *cepheid single result test* Anterior Nasal Swab     Status: None   Collection Time: 04/08/22  8:55 PM   Specimen: Anterior Nasal Swab  Result Value Ref Range Status   SARS Coronavirus 2 by RT PCR NEGATIVE NEGATIVE Final    Comment: (NOTE) SARS-CoV-2 target nucleic acids are NOT DETECTED.  The SARS-CoV-2 RNA is generally detectable in upper and lower respiratory specimens during the acute phase of infection. The lowest concentration of SARS-CoV-2 viral copies this assay can detect is 250 copies / mL. A negative result does not preclude SARS-CoV-2 infection and should not be used as the sole basis for treatment or other patient management decisions.  A negative result may occur with improper specimen collection / handling, submission of specimen other than  nasopharyngeal swab, presence of viral mutation(s) within the areas targeted by this assay, and inadequate number of viral copies (<250 copies / mL). A negative result must be combined with clinical observations, patient history, and epidemiological information.  Fact Sheet for Patients:   https://www.patel.info/  Fact Sheet for Healthcare Providers: https://hall.com/  This test is not yet approved or  cleared by the [place] FDA and has been authorized for detection and/or diagnosis of SARS-CoV-2 by FDA under an Emergency Use Authorization (EUA).  This EUA will remain in effect (meaning this test can be used) for the duration of the COVID-19 declaration under Section 564(b)(1) of the Act, 21 U.S.C. section 360bbb-3(b)(1), unless the authorization is terminated or revoked sooner.  Performed at [facility], [place] [address] [place], [place] [address]   MRSA Next Gen by PCR, Nasal     Status: None   Collection Time: 04/14/22 11:39 AM   Specimen: Nasal Mucosa; Nasal Swab  Result Value Ref Range Status   MRSA by PCR Next Gen NOT DETECTED NOT DETECTED Final    Comment: (NOTE) The GeneXpert MRSA Assay (FDA approved for NASAL specimens only), is one component of a comprehensive MRSA colonization surveillance program. It is not intended to diagnose MRSA infection nor to guide or monitor treatment for MRSA infections. Test performance is not FDA approved in patients less than 58 years old. Performed at [facility], [place] [address], [place], [place] [address]      Radiology Studies: ECHOCARDIOGRAM COMPLETE  Result Date: 04/16/2022    ECHOCARDIOGRAM REPORT   Patient Name:   Zachary Arias Date of Exam: 04/16/2022 Medical Rec #:  [id number]     Height:       68.0 in Accession #:    [id number]    Weight:       139.1 lb Date of Birth:  1960-12-03     BSA:          1.752 m Patient Age:    87 years      BP:           98/58 mmHg Patient  Gender: M             HR:  86 bpm. Exam Location:  Inpatient Procedure: 2D Echo, Cardiac Doppler and Color Doppler Indications:    S/P Mitral valve clip implantation  History:        Patient has prior history of Echocardiogram examinations, most                 recent 04/10/2022. CAD; Risk Factors:Hypertension.  Sonographer:    Jefferey Pica Referring Phys: [phone] JILL D MCDANIEL IMPRESSIONS  1. Left ventricular ejection fraction, by estimation, is 20 to 25%. The left ventricle has severely decreased function. The left ventricle demonstrates global hypokinesis. The left ventricular internal cavity size was severely dilated. Left ventricular diastolic parameters are indeterminate.  2. Right ventricular systolic function is normal. The right ventricular size is normal. There is mildly elevated pulmonary artery systolic pressure.  3. Left atrial size was moderately dilated.  4. Post mitral clip procedure with 2 XTW clips straddling A2/P2 Mean gradient only 3 peak 7 mmHg when patient in atrial bigeminy recorded HR not accurate due to ectopy. Mild to moderate residual MR Jet eminates lateral to clips . The mitral valve has been repaired/replaced. No evidence of mitral valve regurgitation. No evidence of mitral stenosis.  5. Tricuspid valve regurgitation is severe.  6. The aortic valve is tricuspid. Aortic valve regurgitation is not visualized. No aortic stenosis is present.  7. The inferior vena cava is normal in size with greater than 50% respiratory variability, suggesting right atrial pressure of 3 mmHg. FINDINGS  Left Ventricle: Left ventricular ejection fraction, by estimation, is 20 to 25%. The left ventricle has severely decreased function. The left ventricle demonstrates global hypokinesis. The left ventricular internal cavity size was severely dilated. There is no left ventricular hypertrophy. Left ventricular diastolic parameters are indeterminate. Right Ventricle: The right ventricular size is  normal. No increase in right ventricular wall thickness. Right ventricular systolic function is normal. There is mildly elevated pulmonary artery systolic pressure. The tricuspid regurgitant velocity is 2.56  m/s, and with an assumed right atrial pressure of 15 mmHg, the estimated right ventricular systolic pressure is [id number] mmHg. Left Atrium: Left atrial size was moderately dilated. Right Atrium: Right atrial size was normal in size. Pericardium: There is no evidence of pericardial effusion. Mitral Valve: Post mitral clip procedure with 2 XTW clips straddling A2/P2 Mean gradient only 3 peak 7 mmHg when patient in atrial bigeminy recorded HR not accurate due to ectopy. Mild to moderate residual MR Jet eminates lateral to clips. The mitral valve has been repaired/replaced. No evidence of mitral valve regurgitation. No evidence of mitral valve stenosis. MV peak gradient, 7.2 mmHg. The mean mitral valve gradient is 3.5 mmHg. Tricuspid Valve: The tricuspid valve is normal in structure. Tricuspid valve regurgitation is severe. No evidence of tricuspid stenosis. Aortic Valve: The aortic valve is tricuspid. Aortic valve regurgitation is not visualized. No aortic stenosis is present. Aortic valve peak gradient measures 5.0 mmHg. Pulmonic Valve: The pulmonic valve was normal in structure. Pulmonic valve regurgitation is mild. No evidence of pulmonic stenosis. Aorta: The aortic root is normal in size and structure. Venous: The inferior vena cava is normal in size with greater than 50% respiratory variability, suggesting right atrial pressure of 3 mmHg. IAS/Shunts: No atrial level shunt detected by color flow Doppler.  LEFT VENTRICLE PLAX 2D LVIDd:         6.20 cm LVIDs:         5.70 cm LV PW:         1.20 cm LV IVS:  0.90 cm LVOT diam:     2.00 cm LV SV:         56 LV SV Index:   32 LVOT Area:     3.14 cm  RIGHT VENTRICLE             IVC RV Basal diam:  3.50 cm     IVC diam: 2.40 cm RV S prime:     11.90 cm/s TAPSE  (M-mode): 1.8 cm LEFT ATRIUM              Index         RIGHT ATRIUM           Index LA diam:        3.90 cm  2.23 cm/m    RA Area:     20.20 cm LA Vol (A2C):   200.0 ml 114.19 ml/m  RA Volume:   57.20 ml  32.66 ml/m LA Vol (A4C):   104.0 ml 59.38 ml/m LA Biplane Vol: 156.0 ml 89.07 ml/m  AORTIC VALVE                 PULMONIC VALVE AV Area (Vmax): 3.35 cm     PV Vmax:       0.73 m/s AV Vmax:        111.67 cm/s  PV Peak grad:  2.1 mmHg AV Peak Grad:   5.0 mmHg LVOT Vmax:      119.00 cm/s LVOT Vmean:     73.500 cm/s LVOT VTI:       0.178 m  AORTA Ao Root diam: 2.80 cm Ao Asc diam:  3.20 cm MITRAL VALVE                TRICUSPID VALVE MV Area (PHT): 1.88 cm     TR Peak grad:   26.2 mmHg MV Area VTI:   1.40 cm     TR Vmax:        256.00 cm/s MV Peak grad:  7.2 mmHg MV Mean grad:  3.5 mmHg     SHUNTS MV Vmax:       1.35 m/s     Systemic VTI:  0.18 m MV Vmean:      86.2 cm/s    Systemic Diam: 2.00 cm MV Decel Time: 404 msec MR Peak grad: 45.7 mmHg MR Vmax:      338.00 cm/s MV E velocity: 116.00 cm/s MV A velocity: 117.00 cm/s MV E/A ratio:  0.99 Charlton Haws MD Electronically signed by Charlton Haws MD Signature Date/Time: 04/16/2022/11:00:50 AM    Final    DG CHEST PORT 1 VIEW  Result Date: 04/16/2022 CLINICAL DATA:  Chest pain. EXAM: PORTABLE CHEST 1 VIEW COMPARISON:  April 14, 2022. FINDINGS: Stable cardiomegaly. Left internal jugular catheter is now noted. Right internal jugular Swan-Ganz catheter is again noted with tip directed into right pulmonary artery. Distal tip of aortic balloon catheter appears to be in the expected position of the proximal descending thoracic aorta. No pneumothorax is noted. Right-sided PICC line is noted. Lungs are clear. Bony thorax is unremarkable. IMPRESSION: Interval placement of left internal jugular catheter with distal tip in expected position of the SVC. Swan-Ganz catheter is unchanged. Distal tip of aortic balloon catheter seen in expected position of proximal descending  thoracic aorta. Electronically Signed   By: Lupita Raider M.D.   On: 04/16/2022 08:36   CARDIAC CATHETERIZATION  Result Date: 04/15/2022 Successful transcatheter edges repair of the mitral valve using 2 MitraClip G4  XT W devices, first device positioned lateral side of A2/P2 and second device position the medial side of A2/P2 Recommend: Postoperative day #1 echo tomorrow Keep IABP in place overnight (discussed with Dr. Haroldine Laws) Aspirin 81 mg daily for at least 3 months   ECHO TEE  Result Date: 04/15/2022    TRANSESOPHOGEAL ECHO REPORT   Patient Name:   Zachary Arias Date of Exam: 04/15/2022 Medical Rec #:  [id number]     Height:       68.0 in Accession #:    [id number]    Weight:       140.7 lb Date of Birth:  September 19, 1961     BSA:          1.760 m Patient Age:    43 years      BP:           113/80 mmHg Patient Gender: M             HR:           83 bpm. Exam Location:  Inpatient Procedure: 3D Echo, Cardiac Doppler, Color Doppler and Transesophageal Echo Indications:    mitral valve insufficiency  History:        Patient has prior history of Echocardiogram examinations, most                 recent 04/10/2022. CHF, CAD; Risk Factors:Hypertension. Mitral                 Clip's implanted on 04/15/22, XTW and XTW.  Sonographer:    Darlina Sicilian RDCS Referring Phys: [id number] DANIEL R BENSIMHON PROCEDURE: The transesophogeal probe was passed without difficulty through the esophogus of the patient. Sedation performed by different physician. The patient developed no complications during the procedure. IMPRESSIONS  1. Intervential TEE for MitraClip.  2. Prior to procedure, there was severe ventricular functional mitral regurgitation (torrential). Regurgitant fraction 68% by 2D PISA, with right pulmonary vein systolic flow reversal. Mitral valve area 5.53 cm2.  3. Mid-mid transeptal puncture performed with no complication.  4. Attempt was made for NTW TEER intervention. Unable to grasp anterior and posterior leaflets.  5.  Placement of an XTW Mitra-Clip in the lateral position. Mean gradient of 5 mm Hg at a heart rate of 89 bpm. Improved mitral regugitation, still severe. Double orifice area with combined valve area 4.15 cm2.  6. Placement of an XTW Mitra-Clip in the medial position. Mean gradient of 6 mm Hg at a heart rate of 84 bpm. Mild to moderate residual mitral regurgitation (+2). Resolution of right pulmonary vein systolic flow reversal. Triple orifice area combined valve area 3.3 cm2. Successful mTEER intervention.  7. Left to right shunting post procedure. No procedural pericardial effusion.  8. Left ventricular ejection fraction, by estimation, is 25 to 30%. The left ventricle has severely decreased function. The left ventricular internal cavity size was severely dilated.  9. Right ventricular systolic function is normal. The right ventricular size is normal. 10. Left atrial size was severely dilated. No left atrial/left atrial appendage thrombus was detected. 11. Right atrial size was severely dilated. 12. Tricuspid valve regurgitation is mild to moderate. 13. The aortic valve is tricuspid. Aortic valve regurgitation is trivial. No aortic stenosis is present. FINDINGS  Left Ventricle: Left ventricular ejection fraction, by estimation, is 25 to 30%. The left ventricle has severely decreased function. The left ventricular internal cavity size was severely dilated. Right Ventricle: The right ventricular size is normal. Right vetricular wall thickness was not  well visualized. Right ventricular systolic function is normal. Left Atrium: Left atrial size was severely dilated. No left atrial/left atrial appendage thrombus was detected. Right Atrium: Right atrial size was severely dilated. Pericardium: Trivial pericardial effusion is present. Mitral Valve: The mitral valve is grossly normal. Severe mitral valve regurgitation. MV peak gradient, 8.0 mmHg. The mean mitral valve gradient is 3.5 mmHg. Tricuspid Valve: The tricuspid valve  is normal in structure. Tricuspid valve regurgitation is mild to moderate. No evidence of tricuspid stenosis. Aortic Valve: The aortic valve is tricuspid. Aortic valve regurgitation is trivial. No aortic stenosis is present. Pulmonic Valve: The pulmonic valve was normal in structure. Pulmonic valve regurgitation is mild. Aorta: The aortic root is normal in size and structure. IAS/Shunts: Evidence of atrial level shunting detected by color flow Doppler.  MITRAL VALVE MV Area (plan): 4.93 cm MV Peak grad:   8.0 mmHg MV Mean grad:   3.5 mmHg MV Vmax:        1.42 m/s MV Vmean:       83.9 cm/s MR Peak grad:    92.4 mmHg MR Mean grad:    71.0 mmHg MR Vmax:         480.50 cm/s MR Vmean:        407.0 cm/s MR PISA:         0.57 cm MR PISA Eff ROA: 4 mm MR PISA Radius:  0.30 cm Riley Lam MD Electronically signed by Riley Lam MD Signature Date/Time: 04/15/2022/4:59:22 PM    Final      Scheduled Meds:  amiodarone  200 mg Oral BID   aspirin EC  81 mg Oral Daily   atorvastatin  40 mg Oral Daily   Chlorhexidine Gluconate Cloth  6 each Topical Daily   dapagliflozin propanediol  10 mg Oral Daily   digoxin  0.0625 mg Oral Daily   sodium chloride flush  10-40 mL Intracatheter Q12H   sodium chloride flush  3 mL Intravenous Q12H   sodium chloride flush  3 mL Intravenous Q12H   sodium chloride flush  3 mL Intravenous Q12H   spironolactone  12.5 mg Oral Daily   Continuous Infusions:  sodium chloride Stopped (04/17/22 0955)   sodium chloride 10 mL/hr at 04/15/22 1200   sodium chloride     milrinone 0.125 mcg/kg/min (04/17/22 1059)     LOS: 9 days    Time spent:    Zannie Cove, MD [facility]   04/17/2022, 11:28 AM

## 2022-04-18 DIAGNOSIS — I340 Nonrheumatic mitral (valve) insufficiency: Secondary | ICD-10-CM | POA: Diagnosis not present

## 2022-04-18 DIAGNOSIS — I5023 Acute on chronic systolic (congestive) heart failure: Secondary | ICD-10-CM | POA: Diagnosis not present

## 2022-04-18 LAB — CBC
HCT: 29.1 % — ABNORMAL LOW (ref 39.0–52.0)
Hemoglobin: 9.2 g/dL — ABNORMAL LOW (ref 13.0–17.0)
MCH: 27.5 pg (ref 26.0–34.0)
MCHC: 31.6 g/dL (ref 30.0–36.0)
MCV: 86.9 fL (ref 80.0–100.0)
Platelets: 110 10*3/uL — ABNORMAL LOW (ref 150–400)
RBC: 3.35 MIL/uL — ABNORMAL LOW (ref 4.22–5.81)
RDW: 17.6 % — ABNORMAL HIGH (ref 11.5–15.5)
WBC: 8.8 10*3/uL (ref 4.0–10.5)
nRBC: 0.2 % (ref 0.0–0.2)

## 2022-04-18 LAB — BASIC METABOLIC PANEL
Anion gap: 3 — ABNORMAL LOW (ref 5–15)
BUN: 15 mg/dL (ref 6–20)
CO2: 25 mmol/L (ref 22–32)
Calcium: 7.7 mg/dL — ABNORMAL LOW (ref 8.9–10.3)
Chloride: 111 mmol/L (ref 98–111)
Creatinine, Ser: 1 mg/dL (ref 0.61–1.24)
GFR, Estimated: >60 mL/min (ref >60)
Glucose, Bld: 117 mg/dL — ABNORMAL HIGH (ref 70–99)
Potassium: 4.3 mmol/L (ref 3.5–5.1)
Sodium: 139 mmol/L (ref 135–145)

## 2022-04-18 LAB — COOXEMETRY PANEL
Carboxyhemoglobin: 2.1 % — ABNORMAL HIGH (ref 0.5–1.5)
Methemoglobin: <0.7 % (ref 0.0–1.5)
O2 Saturation: 65.2 %
Total hemoglobin: 8.9 g/dL — ABNORMAL LOW (ref 12.0–16.0)

## 2022-04-18 MED ORDER — FUROSEMIDE 10 MG/ML IJ SOLN
80.0000 mg | Freq: Two times a day (BID) | INTRAMUSCULAR | Status: AC
  Administered 2022-04-18: 40 mg via INTRAVENOUS
  Administered 2022-04-18: 80 mg via INTRAVENOUS
  Filled 2022-04-18 (×2): qty 8

## 2022-04-18 MED ORDER — DIGOXIN 125 MCG PO TABS
0.1250 mg | ORAL_TABLET | Freq: Every day | ORAL | Status: DC
  Administered 2022-04-18 – 2022-04-20 (×3): 0.125 mg via ORAL
  Filled 2022-04-18 (×3): qty 1

## 2022-04-18 NOTE — Progress Notes (Addendum)
PROGRESS NOTE    Zachary Arias  [id number] DOB: Oct 26, 1960 DOA: 04/08/2022 PCP: Default, Provider, MD  61/M with history of chronic systolic CHF EF [id number] end-stage inotrope dependent on dobutamine, CAD with PCI and stents, chronic hypoxic respiratory failure on 2 L home O2, CKD 3 a presented to the ED with dyspnea.  Has been on continuous dobutamine infusion for low output heart failure since April, also followed by hospice. -In the ED volume overloaded, started on diuretics, 7/17 underwent cardiac cath cardiac cath which noted stable coronary disease, moderately reduced cardiac output and prominent V wave and manage pressures suggesting significant MR, TEE 7/18 noted severe mitral regurgitation  -Seen by structural heart team 7/18, plan for mitral valve clip -7/19-elevated filling pressures, IABP placed -7/20 TEER w/ mitral clip placement -Dr.Cooper -7/21 balloon pump removed -7/22, milrinone down to 0.125  Subjective: -Feels better overall, no complaints, ambulated in the halls yesterday, breathing better  Assessment and Plan:  Acute on chronic systolic (congestive) heart failure (HCC) Severe mitral regurgitation 04/23 echo with LV systolic function 30 to [id number], global hypokinesis, preserved RV systolic function, severe enlargement of pulmonary artery with RVSP 64,0 mmHg. Severe LA enlargement. Severe mitral regurgitation,  -Mixed cardiomyopathy, cardiac MRI 09/2009 with EF of 31%, inferior scar -Considered end-stage heart failure, inotrope dependent on home dobutamine -Not a candidate for advanced therapies, no support system, not on citizen status etc. -Right heart cath/LHC this admission noted stable one-vessel disease, moderate pulmonary hypertension, moderately reduced CO on dobutamine and severe MR, -TEE 7/18 -severe MR, structural heart team consulted for mitral valve clip -Per heart failure team, balloon pump placed 7/19, dobutamine discontinued, now on milrinone, s/p TEER with  mitral clip 7/20 with good results -Balloon pump removed 7/21 -Remains on digoxin, Farxiga, Aldactone, milrinone  -Per heart failure team, starting IV lasix and stopping Milrinone  Coronary artery disease involving native coronary artery of native heart without angina pectoris Continue with aspirin and statin.  -LHC with one-vessel CAD, chronically occluded RCA  AKI on CKD 3a -Baseline creatinine around 1.6,  -Creatinine has normalized on inotropes  Acute on chronic respiratory failure with hypoxia (HCC) Patient with severe pulmonary hypertension. -Improving, on 2 L home O2  Frequent PVCs -Remains on amiodarone  Goals of care, counseling/discussion Followed by hospice , now w/ advanced cardiac interventions Code status is DNR    DVT prophylaxis:  Lovenox Code Status: DNR Family Communication: Discussed with patient detail, no family at bedside Disposition Plan: To be determined  Consultants: Advanced heart failure team   Procedures: 7/17 RHC/LHC - on DBA 5. Stable 1V CAD, moderate mixed pulmonary HTN, moderately reduced CO on DBA 5, prominent v waves in PCWP tracing suggestive of significant MR   TEE 7/19  LVEF 25% severe central MR. RV moderately down.   Antimicrobials:    Objective: Vitals:   04/18/22 0700 04/18/22 0800 04/18/22 0900 04/18/22 1000  BP: 107/79 106/69    Pulse: 74 67 75 78  Resp: 20 17 16 11   Temp: (!) 97.3 F (36.3 C) 97.7 F (36.5 C) 97.7 F (36.5 C) (!) 97.5 F (36.4 C)  TempSrc:      SpO2: 98% 94% 96% 97%  Weight:      Height:        Intake/Output Summary (Last 24 hours) at 04/18/2022 1133 Last data filed at 04/18/2022 1000 Gross per 24 hour  Intake 743.06 ml  Output 1350 ml  Net -606.94 ml   Filed Weights   04/15/22 0500 04/16/22  0500 04/18/22 0500  Weight: 63.8 kg 63.1 kg 70.1 kg    Examination:  General exam: Chronically ill, laying in bed, AAOx3, no distress HEENT: Right IJ Swan-Ganz catheter, left IJ triple-lumen CVs:  S1-S2, regular rhythm Lungs: Clear anteriorly Abd: soft, NT, BS present Extremities: No edema Psychiatry:  Mood & affect appropriate.     Data Reviewed:   CBC: Recent Labs  Lab 04/15/22 0448 04/15/22 2125 04/16/22 0427 04/17/22 0323 04/18/22 0331  WBC 9.5 11.6* 9.3 8.9 8.8  HGB 10.6* 10.3* 9.7* 9.5* 9.2*  HCT 33.6* 33.6* 30.4* 30.1* 29.1*  MCV 88.7 88.7 87.9 87.8 86.9  PLT 160 120* 112* 104* 110*   Basic Metabolic Panel: Recent Labs  Lab 04/12/22 0450 04/12/22 1350 04/13/22 0421 04/14/22 7510 04/15/22 0448 04/15/22 2125 04/16/22 0427 04/17/22 0323 04/18/22 0331  NA 144   < > 142   < > 145 150* 146* 138 139  K 4.2   < > 4.4   < > 3.4* 3.3* 4.3 3.8 4.3  CL 112*  --  112*   < > 113* 113* 115* 111 111  CO2 24  --  25   < > 25 26 27 22 25   GLUCOSE 130*  --  255*   < > 119* 106* 86 110* 117*  BUN 33*  --  35*   < > 33* 25* 22* 19 15  CREATININE 1.49*  --  1.52*   < > 1.41* 1.12 1.06 1.00 1.00  CALCIUM 8.7*  --  8.8*   < > 7.7* 7.9* 7.6* 7.4* 7.7*  MG 2.6*  --  2.6*  --   --   --   --   --   --   PHOS  --   --  3.8  --   --   --   --   --   --    < > = values in this interval not displayed.   GFR: Estimated Creatinine Clearance: 77 mL/min (by C-G formula based on SCr of 1 mg/dL). Liver Function Tests: No results for input(s): "AST", "ALT", "ALKPHOS", "BILITOT", "PROT", "ALBUMIN" in the last 168 hours.  No results for input(s): "LIPASE", "AMYLASE" in the last 168 hours. No results for input(s): "AMMONIA" in the last 168 hours. Coagulation Profile: Recent Labs  Lab 04/15/22 0448  INR 1.3*   Cardiac Enzymes: No results for input(s): "CKTOTAL", "CKMB", "CKMBINDEX", "TROPONINI" in the last 168 hours. BNP (last 3 results) No results for input(s): "PROBNP" in the last 8760 hours. HbA1C: No results for input(s): "HGBA1C" in the last 72 hours. CBG: No results for input(s): "GLUCAP" in the last 168 hours. Lipid Profile: No results for input(s): "CHOL", "HDL",  "LDLCALC", "TRIG", "CHOLHDL", "LDLDIRECT" in the last 72 hours. Thyroid Function Tests: No results for input(s): "TSH", "T4TOTAL", "FREET4", "T3FREE", "THYROIDAB" in the last 72 hours. Anemia Panel: No results for input(s): "VITAMINB12", "FOLATE", "FERRITIN", "TIBC", "IRON", "RETICCTPCT" in the last 72 hours. Urine analysis:    Component Value Date/Time   COLORURINE YELLOW 03/05/2020 0557   APPEARANCEUR CLEAR 03/05/2020 0557   LABSPEC 1.025 03/05/2020 0557   PHURINE 5.5 03/05/2020 0557   GLUCOSEU NEGATIVE 03/05/2020 0557   HGBUR TRACE (A) 03/05/2020 0557   BILIRUBINUR NEGATIVE 03/05/2020 0557   KETONESUR NEGATIVE 03/05/2020 0557   PROTEINUR 100 (A) 03/05/2020 0557   UROBILINOGEN 0.2 06/24/2009 1116   NITRITE NEGATIVE 03/05/2020 0557   LEUKOCYTESUR NEGATIVE 03/05/2020 0557   Sepsis Labs: @LABRCNTIP (procalcitonin:4,lacticidven:4)  ) Recent Results (from the  past 240 hour(s))  SARS Coronavirus 2 by RT PCR (hospital order, performed in [facility] hospital lab) *cepheid single result test* Anterior Nasal Swab     Status: None   Collection Time: 04/08/22  8:55 PM   Specimen: Anterior Nasal Swab  Result Value Ref Range Status   SARS Coronavirus 2 by RT PCR NEGATIVE NEGATIVE Final    Comment: (NOTE) SARS-CoV-2 target nucleic acids are NOT DETECTED.  The SARS-CoV-2 RNA is generally detectable in upper and lower respiratory specimens during the acute phase of infection. The lowest concentration of SARS-CoV-2 viral copies this assay can detect is 250 copies / mL. A negative result does not preclude SARS-CoV-2 infection and should not be used as the sole basis for treatment or other patient management decisions.  A negative result may occur with improper specimen collection / handling, submission of specimen other than nasopharyngeal swab, presence of viral mutation(s) within the areas targeted by this assay, and inadequate number of viral copies (<250 copies / mL). A negative result  must be combined with clinical observations, patient history, and epidemiological information.  Fact Sheet for Patients:   RoadLapTop.co.za  Fact Sheet for Healthcare Providers: http://kim-miller.com/  This test is not yet approved or  cleared by the [place] FDA and has been authorized for detection and/or diagnosis of SARS-CoV-2 by FDA under an Emergency Use Authorization (EUA).  This EUA will remain in effect (meaning this test can be used) for the duration of the COVID-19 declaration under Section 564(b)(1) of the Act, 21 U.S.C. section 360bbb-3(b)(1), unless the authorization is terminated or revoked sooner.  Performed at [facility], [address] [place], [place] [address]   MRSA Next Gen by PCR, Nasal     Status: None   Collection Time: 04/14/22 11:39 AM   Specimen: Nasal Mucosa; Nasal Swab  Result Value Ref Range Status   MRSA by PCR Next Gen NOT DETECTED NOT DETECTED Final    Comment: (NOTE) The GeneXpert MRSA Assay (FDA approved for NASAL specimens only), is one component of a comprehensive MRSA colonization surveillance program. It is not intended to diagnose MRSA infection nor to guide or monitor treatment for MRSA infections. Test performance is not FDA approved in patients less than 37 years old. Performed at [facility], [address] [place], [place] [address]      Radiology Studies: No results found.   Scheduled Meds:  amiodarone  200 mg Oral BID   aspirin EC  81 mg Oral Daily   atorvastatin  40 mg Oral Daily   Chlorhexidine Gluconate Cloth  6 each Topical Daily   dapagliflozin propanediol  10 mg Oral Daily   digoxin  0.125 mg Oral Daily   enoxaparin (LOVENOX) injection  40 mg Subcutaneous Q24H   furosemide  80 mg Intravenous BID   sodium chloride flush  3 mL Intravenous Q12H   spironolactone  12.5 mg Oral Daily   Continuous Infusions:  sodium chloride 10 mL/hr at 04/15/22 1200    sodium chloride     milrinone Stopped (04/18/22 0901)     LOS: 10 days    Time spent:    Zannie Cove, MD [facility]   04/18/2022, 11:33 AM

## 2022-04-18 NOTE — Progress Notes (Addendum)
Advanced Heart Failure Rounding Note  PCP-Cardiologist: Dr. Gala Romney   Subjective:    Echo 04/10/22 EF 25-30% Severe MR/TR. Moderate RV HK   7/17 RHC/LHC - on DBA 5. Stable 1V CAD, moderate mixed pulmonary HTN, moderately reduced CO on DBA 5, prominent v waves in PCWP tracing suggestive of significant MR 7/19 IABP and swan placed for optimization. PAPi Switched from DBA to Milrinone 0.375 mcg. 7/20 S/P mTEER 7/21 IABP out  Milrinone down to 0.125. Feels ok. Denies CP, SOB, orthopnea or PND. CVP 14  CVP 14 PA 55/22 Thermo 4.6/2.6 Co-ox 65%  Objective:   Weight Range: 70.1 kg Body mass index is 23.5 kg/m.   Vital Signs:   Temp:  [97 F (36.1 C)-99 F (37.2 C)] 97.3 F (36.3 C) (07/23 0700) Pulse Rate:  [62-82] 74 (07/23 0700) Resp:  [10-25] 20 (07/23 0700) BP: (94-121)/(61-88) 107/79 (07/23 0700) SpO2:  [91 %-100 %] 98 % (07/23 0700) Weight:  [70.1 kg] 70.1 kg (07/23 0500) Last BM Date : 04/15/22  Weight change: Filed Weights   04/15/22 0500 04/16/22 0500 04/18/22 0500  Weight: 63.8 kg 63.1 kg 70.1 kg    Intake/Output:   Intake/Output Summary (Last 24 hours) at 04/18/2022 0814 Last data filed at 04/18/2022 0400 Gross per 24 hour  Intake 709.55 ml  Output 1350 ml  Net -640.45 ml       Physical Exam   General:  Sitting up in bed . No resp difficulty HEENT: normal Neck: supple.RIJ swan  LIJ TLC Carotids 2+ bilat; no bruits. No lymphadenopathy or thryomegaly appreciated. Cor: PMI nondisplaced. Regular rate & rhythm. No rubs, gallops or murmurs. Lungs: clear Abdomen: soft, nontender, nondistended. No hepatosplenomegaly. No bruits or masses. Good bowel sounds. Extremities: no cyanosis, clubbing, rash, 1-2+ edema Neuro: alert & orientedx3, cranial nerves grossly intact. moves all 4 extremities w/o difficulty. Affect pleasant   Telemetry   SR 70-80s Personally reviewed  Labs    CBC Recent Labs    04/17/22 0323 04/18/22 0331  WBC 8.9 8.8  HGB  9.5* 9.2*  HCT 30.1* 29.1*  MCV 87.8 86.9  PLT 104* 110*    Basic Metabolic Panel Recent Labs    40/98/11 0323 04/18/22 0331  NA 138 139  K 3.8 4.3  CL 111 111  CO2 22 25  GLUCOSE 110* 117*  BUN 19 15  CREATININE 1.00 1.00  CALCIUM 7.4* 7.7*    Liver Function Tests No results for input(s): "AST", "ALT", "ALKPHOS", "BILITOT", "PROT", "ALBUMIN" in the last 72 hours. No results for input(s): "LIPASE", "AMYLASE" in the last 72 hours. Cardiac Enzymes No results for input(s): "CKTOTAL", "CKMB", "CKMBINDEX", "TROPONINI" in the last 72 hours.  BNP: BNP (last 3 results) Recent Labs    02/12/22 1432 03/09/22 1540 04/08/22 2055  BNP 562.1* 503.3* 3,236.0*     ProBNP (last 3 results) No results for input(s): "PROBNP" in the last 8760 hours.   D-Dimer No results for input(s): "DDIMER" in the last 72 hours. Hemoglobin A1C No results for input(s): "HGBA1C" in the last 72 hours. Fasting Lipid Panel No results for input(s): "CHOL", "HDL", "LDLCALC", "TRIG", "CHOLHDL", "LDLDIRECT" in the last 72 hours. Thyroid Function Tests No results for input(s): "TSH", "T4TOTAL", "T3FREE", "THYROIDAB" in the last 72 hours.  Invalid input(s): "FREET3"  Other results:   Imaging    No results found.   Medications:     Scheduled Medications:  amiodarone  200 mg Oral BID   aspirin EC  81 mg Oral  Daily   atorvastatin  40 mg Oral Daily   Chlorhexidine Gluconate Cloth  6 each Topical Daily   dapagliflozin propanediol  10 mg Oral Daily   digoxin  0.0625 mg Oral Daily   enoxaparin (LOVENOX) injection  40 mg Subcutaneous Q24H   sodium chloride flush  10-40 mL Intracatheter Q12H   sodium chloride flush  3 mL Intravenous Q12H   sodium chloride flush  3 mL Intravenous Q12H   sodium chloride flush  3 mL Intravenous Q12H   spironolactone  12.5 mg Oral Daily    Infusions:  sodium chloride 10 mL/hr at 04/18/22 0400   sodium chloride 10 mL/hr at 04/15/22 1200   sodium chloride      milrinone 0.125 mcg/kg/min (04/18/22 0400)    PRN Medications: sodium chloride, sodium chloride, sodium chloride, acetaminophen **OR** acetaminophen, hydrALAZINE, labetalol, LORazepam, ondansetron **OR** ondansetron (ZOFRAN) IV, polyethylene glycol, sodium chloride flush, sodium chloride flush, sodium chloride flush    Patient Profile   61 y.o. 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 Cath with 1-v CAD with chronically occluded RCA. MRI in January 2011 with EF 31% with inferior scar. Recent admit 4/23 for a/c CHF w/ low output, requiring DBA. He is end-stage and inotrope dependent, on home DBA. Not a good candidate for advanced therapies with lack of support system, and non-citizen status and does not want this. Has been followed by home hospice.   Presented to ED w/ CC of progressive dyspnea and increased supp O2 requirements.   Assessment/Plan   1. Acute on Chronic Systolic Heart Failure - Cath 1-v CAD chronically occluded RCA. MRI in January 2011 EF 31% with inferior scar.  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   - Echo (8/13): EF 45-50% .  - No f/u between to 2015-2021 - Echo (6/21): EF 20-25% severe central MR. Repeat cath 6/21 with stable CAD - Echo (4/22): EF 40-45% mild to mod MR RV mild HK  - Echo (4/23): EF 30-35%.   - Admit 4/23 with shock, started on DBA and lasix. Diuresed 20 lbs. Unable to wean DBA discharged on home DBA 5 mcg/kg/min with hospice care - Admitted 7/23 with a/c HF.Echo 04/10/22 EF 25-30% Severe MR/TR. Moderate RV HK  - RHC/LHC with stable 1V disease, moderate mixed pulmonary HTN, moderately reduced CO on DBA 5, and prominent v waves in PCWP tracing suggestive of significant MR - Able to obtain Medicaid coverage - 7/19 had IABP + swan placed. PA pressures elevated on DBA. Switched to Milrinone.0.375 mcg. - Felt not to be candidate for  VAD/transplant due to compliance issues and lack of caregiver support -> underwent mTEER on 7/20 with excellent result - IABP pulled 7/21 - Hemodynamics stable. Volume up. Diurese with IV lasix.  Can stop milrinone. Pull swan - Increase digoxin to 0.125 mg daily.  - Continue spiro 12.5 mg daily. - Continue Farxiga   2. AKI on CKD Stage IIIa likely due to ATN/overdiuresis - Creatinine baseline 1.6 - 1.82 on admit>>1.00 - resolved   3. PVCs - Continue amio 200 mg bid. - K and Mag stable.   4. CAD  - Cath this admit stable with 1-v CAD with chronically occluded RCA - No angina - Continue ASA and atorvastatin.   5. Transaminitis - suspect due to passive congestion vs low output (co-ox ok)  6. Severe MR/TR -S/P mTEER 7/20  MR 4+ -> 2+  CRITICAL CARE Performed by: Arvilla Meres  Total critical care time: 35 minutes  Critical care time was exclusive of separately billable procedures and treating other patients.  Critical care was necessary to treat or prevent imminent or life-threatening deterioration.  Critical care was time spent personally by me (independent of midlevel providers or residents) on the following activities: development of treatment plan with patient and/or surrogate as well as nursing, discussions with consultants, evaluation of patient's response to treatment, examination of patient, obtaining history from patient or surrogate, ordering and performing treatments and interventions, ordering and review of laboratory studies, ordering and review of radiographic studies, pulse oximetry and re-evaluation of patient's condition.   Length of Stay: 10  Arvilla Meres, MD  04/18/2022, 8:14 AM  Advanced Heart Failure Team Pager [phone] (M-F; 7a - 5p)  Please contact [facility] for night-coverage after hours (5p -7a ) and weekends on [email or web address]

## 2022-04-19 ENCOUNTER — Institutional Professional Consult (permissible substitution): Payer: Self-pay | Admitting: Cardiology

## 2022-04-19 DIAGNOSIS — I2510 Atherosclerotic heart disease of native coronary artery without angina pectoris: Secondary | ICD-10-CM | POA: Diagnosis not present

## 2022-04-19 DIAGNOSIS — I5023 Acute on chronic systolic (congestive) heart failure: Secondary | ICD-10-CM | POA: Diagnosis not present

## 2022-04-19 DIAGNOSIS — N179 Acute kidney failure, unspecified: Secondary | ICD-10-CM | POA: Diagnosis not present

## 2022-04-19 DIAGNOSIS — J9621 Acute and chronic respiratory failure with hypoxia: Secondary | ICD-10-CM | POA: Diagnosis not present

## 2022-04-19 DIAGNOSIS — I340 Nonrheumatic mitral (valve) insufficiency: Secondary | ICD-10-CM | POA: Diagnosis not present

## 2022-04-19 LAB — CBC
HCT: 32.5 % — ABNORMAL LOW (ref 39.0–52.0)
Hemoglobin: 10.3 g/dL — ABNORMAL LOW (ref 13.0–17.0)
MCH: 27.6 pg (ref 26.0–34.0)
MCHC: 31.7 g/dL (ref 30.0–36.0)
MCV: 87.1 fL (ref 80.0–100.0)
Platelets: 166 10*3/uL (ref 150–400)
RBC: 3.73 MIL/uL — ABNORMAL LOW (ref 4.22–5.81)
RDW: 17.5 % — ABNORMAL HIGH (ref 11.5–15.5)
WBC: 9.1 10*3/uL (ref 4.0–10.5)
nRBC: 0.3 % — ABNORMAL HIGH (ref 0.0–0.2)

## 2022-04-19 LAB — BASIC METABOLIC PANEL
Anion gap: 4 — ABNORMAL LOW (ref 5–15)
BUN: 15 mg/dL (ref 6–20)
CO2: 29 mmol/L (ref 22–32)
Calcium: 8.1 mg/dL — ABNORMAL LOW (ref 8.9–10.3)
Chloride: 110 mmol/L (ref 98–111)
Creatinine, Ser: 1.08 mg/dL (ref 0.61–1.24)
GFR, Estimated: >60 mL/min (ref >60)
Glucose, Bld: 87 mg/dL (ref 70–99)
Potassium: 3.5 mmol/L (ref 3.5–5.1)
Sodium: 143 mmol/L (ref 135–145)

## 2022-04-19 LAB — COOXEMETRY PANEL
Carboxyhemoglobin: 1.4 % (ref 0.5–1.5)
Carboxyhemoglobin: 1 % (ref 0.5–1.5)
Methemoglobin: 1.4 % (ref 0.0–1.5)
Methemoglobin: <0.7 % (ref 0.0–1.5)
O2 Saturation: 82.2 %
O2 Saturation: 55.2 %
Total hemoglobin: 10.4 g/dL — ABNORMAL LOW (ref 12.0–16.0)
Total hemoglobin: 11.4 g/dL — ABNORMAL LOW (ref 12.0–16.0)

## 2022-04-19 LAB — MAGNESIUM: Magnesium: 2.2 mg/dL (ref 1.7–2.4)

## 2022-04-19 MED ORDER — POTASSIUM CHLORIDE CRYS ER 20 MEQ PO TBCR
40.0000 meq | EXTENDED_RELEASE_TABLET | Freq: Two times a day (BID) | ORAL | Status: DC
  Administered 2022-04-19 – 2022-04-20 (×3): 40 meq via ORAL
  Filled 2022-04-19 (×2): qty 2

## 2022-04-19 MED ORDER — FUROSEMIDE 10 MG/ML IJ SOLN
80.0000 mg | Freq: Two times a day (BID) | INTRAMUSCULAR | Status: DC
  Administered 2022-04-19 (×2): 80 mg via INTRAVENOUS
  Filled 2022-04-19 (×2): qty 8

## 2022-04-19 NOTE — Progress Notes (Deleted)
Electrophysiology Office Note   Date:  04/19/2022   ID:  Zachary Arias, DOB 1961/01/06, MRN [id number]  PCP:  Default, Provider, MD  Cardiologist:  *** Primary Electrophysiologist: *** Jamond Neels Jorja Loa, MD    Chief Complaint: ***   History of Present Illness: Zachary Arias is a 61 y.o. male who is being seen today for the evaluation of *** at the request of Bensimhon, Bevelyn Buckles, MD. Presenting today for electrophysiology evaluation.    Today, he denies*** symptoms of palpitations, chest pain, shortness of breath, orthopnea, PND, lower extremity edema, claudication, dizziness, presyncope, syncope, bleeding, or neurologic sequela. The patient is tolerating medications without difficulties.    Past Medical History:  Diagnosis Date   Congestive heart failure (CHF) (HCC)    secondary to ischemic cardiomyopathy with an ejection fraction of 25-30%   Coronary artery disease    History of hypertension    Inguinal hernia 09/28/2003   History of left inguinal hernia, status post repair in 2005   S/P mitral valve clip implantation 04/15/2022   Mitraclip XTW x2 with Dr. Excell Seltzer and Dr. Lynnette Caffey   Tachycardia 09/27/2008    Unexplained tachycardia during hospitalization   Past Surgical History:  Procedure Laterality Date   CARDIAC CATHETERIZATION   06/27/2009   ejection fraction of approximately 35%   IABP INSERTION N/A 04/14/2022   Procedure: IABP Insertion;  Surgeon: [name], MD;  Location: [facility] INVASIVE CV LAB;  Service: Cardiovascular;  Laterality: N/A;   INGUINAL HERNIA REPAIR  01/27/2004   History of left inguinal hernia, status post repair in 2005   MITRAL VALVE REPAIR N/A 04/15/2022   Procedure: MITRAL VALVE REPAIR;  Surgeon: Tonny Bollman, MD;  Location: [facility] INVASIVE CV LAB;  Service: Cardiovascular;  Laterality: N/A;   RIGHT HEART CATH N/A 04/14/2022   Procedure: RIGHT HEART CATH;  Surgeon: [name], MD;  Location: [facility] INVASIVE CV LAB;  Service:  Cardiovascular;  Laterality: N/A;   RIGHT/LEFT HEART CATH AND CORONARY ANGIOGRAPHY N/A 03/10/2020   Procedure: RIGHT/LEFT HEART CATH AND CORONARY ANGIOGRAPHY;  Surgeon: [name], MD;  Location: [facility] INVASIVE CV LAB;  Service: Cardiovascular;  Laterality: N/A;   RIGHT/LEFT HEART CATH AND CORONARY ANGIOGRAPHY N/A 04/12/2022   Procedure: RIGHT/LEFT HEART CATH AND CORONARY ANGIOGRAPHY;  Surgeon: [name], MD;  Location: [facility] INVASIVE CV LAB;  Service: Cardiovascular;  Laterality: N/A;   TEE WITHOUT CARDIOVERSION N/A 04/13/2022   Procedure: TRANSESOPHAGEAL ECHOCARDIOGRAM (TEE);  Surgeon: [name], MD;  Location: [facility] ENDOSCOPY;  Service: Cardiovascular;  Laterality: N/A;   TEE WITHOUT CARDIOVERSION N/A 04/15/2022   Procedure: TRANSESOPHAGEAL ECHOCARDIOGRAM (TEE);  Surgeon: Tonny Bollman, MD;  Location: [facility] INVASIVE CV LAB;  Service: Cardiovascular;  Laterality: N/A;     No current facility-administered medications for this visit.   No current outpatient medications on file.   Facility-Administered Medications Ordered in Other Visits  Medication Dose Route Frequency Provider Last Rate Last Admin   0.9 %  sodium chloride infusion   Intravenous PRN Georgie Chard D, NP 10 mL/hr at 04/18/22 2000 Infusion Verify at 04/18/22 2000   0.9 %  sodium chloride infusion  250 mL Intravenous PRN Georgie Chard D, NP       acetaminophen (TYLENOL) tablet 650 mg  650 mg Oral Q6H PRN Georgie Chard D, NP       Or   acetaminophen (TYLENOL) suppository 650 mg  650 mg Rectal Q6H PRN Filbert Schilder, NP       amiodarone (PACERONE) tablet  200 mg  200 mg Oral BID Georgie Chard D, NP   200 mg at 04/19/22 0957   aspirin EC tablet 81 mg  81 mg Oral Daily Filbert Schilder, NP   81 mg at 04/19/22 0957   atorvastatin (LIPITOR) tablet 40 mg  40 mg Oral Daily Georgie Chard D, NP   40 mg at 04/19/22 0957   Chlorhexidine Gluconate Cloth 2 % PADS 6 each  6 each Topical Daily Georgie Chard D, NP   6 each  at 04/18/22 1200   dapagliflozin propanediol (FARXIGA) tablet 10 mg  10 mg Oral Daily Bensimhon, Bevelyn Buckles, MD   10 mg at 04/19/22 0957   digoxin (LANOXIN) tablet 0.125 mg  0.125 mg Oral Daily Bensimhon, Bevelyn Buckles, MD   0.125 mg at 04/19/22 0957   enoxaparin (LOVENOX) injection 40 mg  40 mg Subcutaneous Q24H Zannie Cove, MD   40 mg at 04/18/22 1335   furosemide (LASIX) injection 80 mg  80 mg Intravenous BID Bensimhon, Bevelyn Buckles, MD   80 mg at 04/19/22 0956   hydrALAZINE (APRESOLINE) injection 5 mg  5 mg Intravenous Q20 Min PRN Georgie Chard D, NP       labetalol (NORMODYNE) injection 10 mg  10 mg Intravenous Q10 min PRN Georgie Chard D, NP       LORazepam (ATIVAN) tablet 0.5 mg  0.5 mg Oral Q6H PRN Georgie Chard D, NP   0.5 mg at 04/18/22 2129   ondansetron (ZOFRAN) tablet 4 mg  4 mg Oral Q6H PRN Georgie Chard D, NP       Or   ondansetron [facility]) injection 4 mg  4 mg Intravenous Q6H PRN Georgie Chard D, NP   4 mg at 04/15/22 2222   polyethylene glycol (MIRALAX / GLYCOLAX) packet 17 g  17 g Oral Daily PRN Georgie Chard D, NP       potassium chloride SA (KLOR-CON M) CR tablet 40 mEq  40 mEq Oral BID Bensimhon, Bevelyn Buckles, MD   40 mEq at 04/19/22 0957   sodium chloride flush (NS) 0.9 % injection 3 mL  3 mL Intravenous Q12H Georgie Chard D, NP   3 mL at 04/19/22 1003   sodium chloride flush (NS) 0.9 % injection 3 mL  3 mL Intravenous PRN Georgie Chard D, NP       spironolactone (ALDACTONE) tablet 12.5 mg  12.5 mg Oral Daily Georgie Chard D, NP   12.5 mg at 04/19/22 5852    Allergies:   Avocado and Shellfish allergy   Social History:  The patient  reports that he has never smoked. He has never used smokeless tobacco. He reports that he does not currently use alcohol. He reports that he does not currently use drugs.   Family History:  The patient's ***family history includes Cancer in his mother; Cardiomyopathy in his brother; Heart attack in his father; Heart disease in his father.     ROS:  Please see the history of present illness.   Otherwise, review of systems is positive for none.   All other systems are reviewed and negative.    PHYSICAL EXAM: VS:  There were no vitals taken for this visit. , BMI There is no height or weight on file to calculate BMI. GEN: Well nourished, well developed, in no acute distress  HEENT: normal  Neck: no JVD, carotid bruits, or masses Cardiac: ***RRR; no murmurs, rubs, or gallops,no edema  Respiratory:  clear to auscultation bilaterally, normal work of breathing GI:  soft, nontender, nondistended, + BS MS: no deformity or atrophy  Skin: warm and dry, ***device pocket is well healed Neuro:  Strength and sensation are intact Psych: euthymic mood, full affect  EKG:  EKG {ACTION; IS/IS ZOX:[id number]} ordered today. Personal review of the ekg ordered *** shows ***  ***Device interrogation is reviewed today in detail.  See PaceArt for details.   Recent Labs: 04/08/2022: B Natriuretic Peptide 3,236.0 04/09/2022: ALT 226 04/13/2022: Magnesium 2.6 04/19/2022: BUN 15; Creatinine, Ser 1.08; Hemoglobin 10.3; Platelets 166; Potassium 3.5; Sodium 143    Lipid Panel     Component Value Date/Time   CHOL 179 03/06/2020 0420   TRIG 91 03/06/2020 0420   HDL 42 03/06/2020 0420   CHOLHDL 4.3 03/06/2020 0420   VLDL 18 03/06/2020 0420   LDLCALC 119 (H) 03/06/2020 0420     Wt Readings from Last 3 Encounters:  04/19/22 143 lb 15.4 oz (65.3 kg)  03/09/22 143 lb 9.6 oz (65.1 kg)  02/25/22 142 lb (64.4 kg)      Other studies Reviewed: Additional studies/ records that were reviewed today include: TTE 04/16/22  Review of the above records today demonstrates:   1. Left ventricular ejection fraction, by estimation, is 20 to 25%. The  left ventricle has severely decreased function. The left ventricle  demonstrates global hypokinesis. The left ventricular internal cavity size  was severely dilated. Left ventricular  diastolic parameters are  indeterminate.   2. Right ventricular systolic function is normal. The right ventricular  size is normal. There is mildly elevated pulmonary artery systolic  pressure.   3. Left atrial size was moderately dilated.   4. Post mitral clip procedure with 2 XTW clips straddling A2/P2 Mean  gradient only 3 peak 7 mmHg when patient in atrial bigeminy recorded HR  not accurate due to ectopy. Mild to moderate residual MR Jet eminates  lateral to clips . The mitral valve has  been repaired/replaced. No evidence of mitral valve regurgitation. No  evidence of mitral stenosis.   5. Tricuspid valve regurgitation is severe.   6. The aortic valve is tricuspid. Aortic valve regurgitation is not  visualized. No aortic stenosis is present.   7. The inferior vena cava is normal in size with greater than 50%  respiratory variability, suggesting right atrial pressure of 3 mmHg.   LHC 04/12/22   Mid LAD lesion is 40% stenosed.   Mid Cx lesion is 30% stenosed.   Prox RCA lesion is 50% stenosed.   Dist RCA lesion is 100% stenosed.   1st Mrg lesion is 30% stenosed.  Cardiac monitor 12/12/2021 personally reviewed 1. Sinus rhythm - avg HR of 99 bpm. 2. 1164 runs of nonsustained Ventricular Tachycardia occurred, the run with the fastest interval lasting 19 beats with a max rate of 255 bpm (avg 191 bpm); the run with the fastest interval was also the longest. Ventricular Tachycardia was detected within +/- 45 seconds of symptomatic patient event(s). 3. Frequent PVCs (13.1%, [id number]),  Ventricular Couplets were occasional (3.9%, 37189), and VE Triplets were occasional (1.1%, 6941). - there were 3 major morphologies for PVCs (all around 4% prevalence) 4. Ventricular Bigeminy and Trigeminy were present. 5. Some VT runs may be SVT with abberancy  ASSESSMENT AND PLAN:  1.  ***    Current medicines are reviewed at length with the patient today.   The patient {ACTIONS; HAS/DOES NOT HAVE:19233} concerns regarding his  medicines.  The following changes were made today:  {NONE DEFAULTED:18576}  Labs/ tests ordered  today include: *** No orders of the defined types were placed in this encounter.    Disposition:   FU with Daris Harkins {gen number [id number]} {Days to years:10300}  Signed, Ladelle Teodoro Jorja Loa, MD  04/19/2022 10:12 AM     [facility] [address] [place] [address] [phone] (office) [phone] (fax)

## 2022-04-19 NOTE — Progress Notes (Signed)
CARDIAC REHAB PHASE I   PRE:  Rate/Rhythm: 68 SR  BP:  Sitting: 94/65      SaO2: 99 RA  MODE:  Ambulation: 290 ft   POST:  Rate/Rhythm: 70 SR  BP:  Sitting: 103/76      SaO2: 93 RA  Pt tolerated walk well using front wheel walker and stand by assist only. No SOB or CP. Back to room to bed with call bell and bedside table in reach. Will continue to follow. [phone]   Woodroe Chen, RN BSN 04/19/2022 8:34 AM

## 2022-04-19 NOTE — Progress Notes (Signed)
PROGRESS NOTE    Zachary Arias  EXH:[id number] DOB: 18-Oct-1960 DOA: 04/08/2022 PCP: Default, Provider, MD  60/M with history of chronic systolic CHF EF 30-35% end-stage inotrope dependent on dobutamine, CAD with PCI and stents, chronic hypoxic respiratory failure on 2 L home O2, CKD 3 a presented to the ED with dyspnea.  Has been on continuous dobutamine infusion for low output heart failure since April, also followed by hospice. -In the ED volume overloaded, started on diuretics, 7/17 underwent cardiac cath cardiac cath which noted stable coronary disease, moderately reduced cardiac output and prominent V wave and manage pressures suggesting significant MR, TEE 7/18 noted severe mitral regurgitation  -Seen by structural heart team 7/18, plan for mitral valve clip -7/19-elevated filling pressures, IABP placed -7/20 TEER w/ mitral clip placement -Dr.Cooper -7/21 balloon pump removed -7/22, milrinone down to 0.125  Subjective: -Feels well, no complaints, no events overnight  Assessment and Plan:  Acute on chronic systolic (congestive) heart failure (HCC) Severe mitral regurgitation 04/23 echo with LV EF 30-35%, global hypokinesis, preserved RV systolic function, severe enlargement of pulmonary artery, Severe LA enlargement. Severe mitral regurgitation,  -Mixed cardiomyopathy, cardiac MRI 09/2009 with EF of 31%, inferior scar -Considered end-stage heart failure, inotrope dependent on home dobutamine -Not a candidate for advanced therapies, no support system, not on citizen status etc. -Right heart cath/LHC this admission noted stable one-vessel disease, moderate pulmonary hypertension, moderately reduced CO on dobutamine and severe MR, -TEE 7/18 -severe MR, structural heart team consulted for mitral valve clip -Per heart failure team, balloon pump placed 7/19, dobutamine discontinued, then started milrinone, s/p TEER with mitral clip 7/20 with good results -Balloon pump removed 7/21 -Remains  on digoxin, Farxiga, Aldactone, -Milrinone discontinued today  Coronary artery disease involving native coronary artery of native heart without angina pectoris Continue with aspirin and statin.  -LHC with one-vessel CAD, chronically occluded RCA  AKI on CKD 3a -Baseline creatinine around 1.6,  -Creatinine has normalized on inotropes  Acute on chronic respiratory failure with hypoxia (HCC) Patient with severe pulmonary hypertension. -Improving, on 2 L home O2  Frequent PVCs -Remains on amiodarone  Goals of care, counseling/discussion Followed by hospice , now w/ advanced cardiac interventions Code status is DNR    DVT prophylaxis:  Lovenox Code Status: DNR Family Communication: Discussed with patient detail, no family at bedside Disposition Plan: To be determined Home likely 48 hours if stable  Consultants: Advanced heart failure team   Procedures: 7/17 RHC/LHC - on DBA 5. Stable 1V CAD, moderate mixed pulmonary HTN, moderately reduced CO on DBA 5, prominent v waves in PCWP tracing suggestive of significant MR   TEE 7/19  LVEF 25% severe central MR. RV moderately down.   Antimicrobials:    Objective: Vitals:   04/19/22 1000 04/19/22 1059 04/19/22 1100 04/19/22 1200  BP: (!) 94/58  100/70 94/62  Pulse: 72  73 77  Resp: (!) 22  13 16   Temp:  97.8 F (36.6 C)    TempSrc:  Oral    SpO2: 91%  96% 99%  Weight:      Height:        Intake/Output Summary (Last 24 hours) at 04/19/2022 1347 Last data filed at 04/19/2022 1200 Gross per 24 hour  Intake 1220 ml  Output 5875 ml  Net -4655 ml   Filed Weights   04/18/22 0500 04/18/22 1700 04/19/22 0500  Weight: 70.1 kg 69.1 kg 65.3 kg    Examination:  General exam: Chronically ill male sitting up in  bed eating breakfast, AAOx3, no distress HEENT: No JVD CVS: S1-S2, regular rhythm, systolic murmur Lungs: Clear bilaterally Abdomen: Soft, nontender, bowel sounds present,  extremities: 1+ edema  Psychiatry:  Mood &  affect appropriate.     Data Reviewed:   CBC: Recent Labs  Lab 04/15/22 2125 04/16/22 0427 04/17/22 0323 04/18/22 0331 04/19/22 0516  WBC 11.6* 9.3 8.9 8.8 9.1  HGB 10.3* 9.7* 9.5* 9.2* 10.3*  HCT 33.6* 30.4* 30.1* 29.1* 32.5*  MCV 88.7 87.9 87.8 86.9 87.1  PLT 120* 112* 104* 110* 166   Basic Metabolic Panel: Recent Labs  Lab 04/13/22 0421 04/14/22 0634 04/15/22 2125 04/16/22 0427 04/17/22 0323 04/18/22 0331 04/19/22 0516  NA 142   < > 150* 146* 138 139 143  K 4.4   < > 3.3* 4.3 3.8 4.3 3.5  CL 112*   < > 113* 115* 111 111 110  CO2 25   < > 26 27 22 25 29   GLUCOSE 255*   < > 106* 86 110* 117* 87  BUN 35*   < > 25* 22* 19 15 15   CREATININE 1.52*   < > 1.12 1.06 1.00 1.00 1.08  CALCIUM 8.8*   < > 7.9* 7.6* 7.4* 7.7* 8.1*  MG 2.6*  --   --   --   --   --  2.2  PHOS 3.8  --   --   --   --   --   --    < > = values in this interval not displayed.   GFR: Estimated Creatinine Clearance: 68 mL/min (by C-G formula based on SCr of 1.08 mg/dL). Liver Function Tests: No results for input(s): "AST", "ALT", "ALKPHOS", "BILITOT", "PROT", "ALBUMIN" in the last 168 hours.  No results for input(s): "LIPASE", "AMYLASE" in the last 168 hours. No results for input(s): "AMMONIA" in the last 168 hours. Coagulation Profile: Recent Labs  Lab 04/15/22 0448  INR 1.3*   Cardiac Enzymes: No results for input(s): "CKTOTAL", "CKMB", "CKMBINDEX", "TROPONINI" in the last 168 hours. BNP (last 3 results) No results for input(s): "PROBNP" in the last 8760 hours. HbA1C: No results for input(s): "HGBA1C" in the last 72 hours. CBG: No results for input(s): "GLUCAP" in the last 168 hours. Lipid Profile: No results for input(s): "CHOL", "HDL", "LDLCALC", "TRIG", "CHOLHDL", "LDLDIRECT" in the last 72 hours. Thyroid Function Tests: No results for input(s): "TSH", "T4TOTAL", "FREET4", "T3FREE", "THYROIDAB" in the last 72 hours. Anemia Panel: No results for input(s): "VITAMINB12", "FOLATE",  "FERRITIN", "TIBC", "IRON", "RETICCTPCT" in the last 72 hours. Urine analysis:    Component Value Date/Time   COLORURINE YELLOW 03/05/2020 0557   APPEARANCEUR CLEAR 03/05/2020 0557   LABSPEC 1.025 03/05/2020 0557   PHURINE 5.5 03/05/2020 0557   GLUCOSEU NEGATIVE 03/05/2020 0557   HGBUR TRACE (A) 03/05/2020 0557   BILIRUBINUR NEGATIVE 03/05/2020 0557   KETONESUR NEGATIVE 03/05/2020 0557   PROTEINUR 100 (A) 03/05/2020 0557   UROBILINOGEN 0.2 06/24/2009 1116   NITRITE NEGATIVE 03/05/2020 0557   LEUKOCYTESUR NEGATIVE 03/05/2020 0557   Sepsis Labs: @LABRCNTIP (procalcitonin:4,lacticidven:4)  ) Recent Results (from the past 240 hour(s))  MRSA Next Gen by PCR, Nasal     Status: None   Collection Time: 04/14/22 11:39 AM   Specimen: Nasal Mucosa; Nasal Swab  Result Value Ref Range Status   MRSA by PCR Next Gen NOT DETECTED NOT DETECTED Final    Comment: (NOTE) The GeneXpert MRSA Assay (FDA approved for NASAL specimens only), is one component of a comprehensive MRSA  colonization surveillance program. It is not intended to diagnose MRSA infection nor to guide or monitor treatment for MRSA infections. Test performance is not FDA approved in patients less than 98 years old. Performed at [facility], [place] [address], [place], [place] [address]      Radiology Studies: No results found.   Scheduled Meds:  amiodarone  200 mg Oral BID   aspirin EC  81 mg Oral Daily   atorvastatin  40 mg Oral Daily   Chlorhexidine Gluconate Cloth  6 each Topical Daily   dapagliflozin propanediol  10 mg Oral Daily   digoxin  0.125 mg Oral Daily   enoxaparin (LOVENOX) injection  40 mg Subcutaneous Q24H   furosemide  80 mg Intravenous BID   potassium chloride  40 mEq Oral BID   sodium chloride flush  3 mL Intravenous Q12H   spironolactone  12.5 mg Oral Daily   Continuous Infusions:  sodium chloride 10 mL/hr at 04/18/22 2000   sodium chloride       LOS: 11 days    Time spent:  64min    Domenic Polite, MD [facility]   04/19/2022, 1:47 PM

## 2022-04-19 NOTE — TOC CM/SW Note (Addendum)
HF TOC CM waiting final disposition for home. [facility] was arranged for outpt follow up on IV Dobutamine, but Home Hospice not needed after procedure. Pt currently not on IV medications. Pt being follow by outpatient HF Paramedicine Team. Will request meds come up from [facility] [place]. [name] RN3 CCM, Heart Failure TOC CM [phone]

## 2022-04-19 NOTE — Progress Notes (Addendum)
Advanced Heart Failure Rounding Note  PCP-Cardiologist: Dr. Haroldine Laws   Subjective:    Echo 04/10/22 EF 25-30% Severe MR/TR. Moderate RV HK   7/17 RHC/LHC - on DBA 5. Stable 1V CAD, moderate mixed pulmonary HTN, moderately reduced CO on DBA 5, prominent v waves in PCWP tracing suggestive of significant MR 7/19 IABP and swan placed for optimization. PAPi Switched from [place] to Milrinone 0.375 mcg. 7/20 S/P mTEER 7/21 IABP out 7/23 Milrinone off. SWAN out.  CO-OX 82% off inotropes.  CVP 9-10. Received IV lasix 80 BID yesterday. Brisk diuresis yesterday. Weight down 9 lb.   SBP 90s-100s  Feels great. Good appetite. Has already walked around the unit. No dyspnea, orthopnea or PND. Still has some LE edema.   Objective:   Weight Range: 65.3 kg Body mass index is 21.89 kg/m.   Vital Signs:   Temp:  [96.7 F (35.9 C)-97.7 F (36.5 C)] 96.7 F (35.9 C) (07/24 0825) Pulse Rate:  [61-85] 73 (07/24 0700) Resp:  [10-27] 14 (07/24 0700) BP: (90-115)/(60-80) 107/79 (07/24 0700) SpO2:  [85 %-100 %] 100 % (07/24 0700) Weight:  [65.3 kg-69.1 kg] 65.3 kg (07/24 0500) Last BM Date : 04/18/22  Weight change: Filed Weights   04/18/22 0500 04/18/22 1700 04/19/22 0500  Weight: 70.1 kg 69.1 kg 65.3 kg    Intake/Output:   Intake/Output Summary (Last 24 hours) at 04/19/2022 0833 Last data filed at 04/19/2022 0500 Gross per 24 hour  Intake 1042.29 ml  Output 6200 ml  Net -5157.71 ml      Physical Exam   General:  Sitting up in bed. No distress. HEENT: normal Neck: supple. JVP 10 cm. Carotids 2+ bilat; no bruits.  Cor: PMI nondisplaced. Regular rate & rhythm. No rubs, gallops, 2/6 MR murmur Lungs: clear Abdomen: soft, nontender, nondistended. Extremities: no cyanosis, clubbing, rash, 2+ edema, + TED hose. Neuro: alert & orientedx3, cranial nerves grossly intact. moves all 4 extremities w/o difficulty. Affect pleasant    Telemetry   SR 60s-70s  Labs    CBC Recent Labs     04/18/22 0331 04/19/22 0516  WBC 8.8 9.1  HGB 9.2* 10.3*  HCT 29.1* 32.5*  MCV 86.9 87.1  PLT 110* [id number]   Basic Metabolic Panel Recent Labs    04/18/22 0331 04/19/22 0516  NA 139 143  K 4.3 3.5  CL 111 110  CO2 25 29  GLUCOSE 117* 87  BUN 15 15  CREATININE 1.00 1.08  CALCIUM 7.7* 8.1*   Liver Function Tests No results for input(s): "AST", "ALT", "ALKPHOS", "BILITOT", "PROT", "ALBUMIN" in the last 72 hours. No results for input(s): "LIPASE", "AMYLASE" in the last 72 hours. Cardiac Enzymes No results for input(s): "CKTOTAL", "CKMB", "CKMBINDEX", "TROPONINI" in the last 72 hours.  BNP: BNP (last 3 results) Recent Labs    02/12/22 1432 03/09/22 1540 04/08/22 2055  BNP 562.1* 503.3* 3,236.0*    ProBNP (last 3 results) No results for input(s): "PROBNP" in the last 8760 hours.   D-Dimer No results for input(s): "DDIMER" in the last 72 hours. Hemoglobin A1C No results for input(s): "HGBA1C" in the last 72 hours. Fasting Lipid Panel No results for input(s): "CHOL", "HDL", "LDLCALC", "TRIG", "CHOLHDL", "LDLDIRECT" in the last 72 hours. Thyroid Function Tests No results for input(s): "TSH", "T4TOTAL", "T3FREE", "THYROIDAB" in the last 72 hours.  Invalid input(s): "FREET3"  Other results:   Imaging    No results found.   Medications:     Scheduled Medications:  amiodarone  200 mg Oral BID   aspirin EC  81 mg Oral Daily   atorvastatin  40 mg Oral Daily   Chlorhexidine Gluconate Cloth  6 each Topical Daily   dapagliflozin propanediol  10 mg Oral Daily   digoxin  0.125 mg Oral Daily   enoxaparin (LOVENOX) injection  40 mg Subcutaneous Q24H   sodium chloride flush  3 mL Intravenous Q12H   spironolactone  12.5 mg Oral Daily    Infusions:  sodium chloride 10 mL/hr at 04/18/22 2000   sodium chloride      PRN Medications: sodium chloride, sodium chloride, acetaminophen **OR** acetaminophen, hydrALAZINE, labetalol, LORazepam, ondansetron **OR**  ondansetron (ZOFRAN) IV, polyethylene glycol, sodium chloride flush    Patient Profile   61 y.o. 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 Cath with 1-v CAD with chronically occluded RCA. MRI in January 2011 with EF 31% with inferior scar. Recent admit 4/23 for a/c CHF w/ low output, requiring DBA. He is end-stage and inotrope dependent, on home DBA. Not a good candidate for advanced therapies with lack of support system, and non-citizen status and does not want this. Has been followed by home hospice.   Presented to ED w/ CC of progressive dyspnea and increased supp O2 requirements.   Assessment/Plan   1. Acute on Chronic Systolic Heart Failure - Cath 1-v CAD chronically occluded RCA. MRI in January 2011 EF 31% with inferior scar.  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   - Echo (8/13): EF 45-50% .  - No f/u between to 2015-2021 - Echo (6/21): EF 20-25% severe central MR. Repeat cath 6/21 with stable CAD - Echo (4/22): EF 40-45% mild to mod MR RV mild HK  - Echo (4/23): EF 30-35%.   - Admit 4/23 with shock, started on DBA and lasix. Diuresed 20 lbs. Unable to wean DBA discharged on home DBA 5 mcg/kg/min with hospice care - Admitted 7/23 with a/c HF.Echo 04/10/22 EF 25-30% Severe MR/TR. Moderate RV HK  - RHC/LHC with stable 1V disease, moderate mixed pulmonary HTN, moderately reduced CO on DBA 5, and prominent v waves in PCWP tracing suggestive of significant MR - Able to obtain Medicaid coverage - 7/19 had IABP + swan placed. PA pressures elevated on DBA. Switched to Milrinone.0.375 mcg. - Felt not to be candidate for VAD/transplant due to compliance issues and lack of caregiver support -> underwent mTEER on 7/20 with excellent result - IABP pulled 7/21. SWAN out. - CO-OX stable off milrinone. - CVP 10. Give 80 mg lasix IV BID today.  - Continue digoxin 0.125 mg  daily.  - Continue spiro 12.5 mg daily. - Continue Farxiga. - TED hose. - BP too soft to further titrate GDMT.  2. AKI on CKD Stage IIIa likely due to ATN/overdiuresis - Creatinine baseline 1.6 - 1.82 on admit>>1.00 - resolved   3. PVCs - Continue amio 200 mg bid. - Supp K. Check Mag.  4. CAD  - Cath this admit stable with 1-v CAD with chronically occluded RCA - No angina - Continue ASA and atorvastatin.   5. Transaminitis - suspect due to passive congestion vs low output (co-ox ok)  6. Severe MR/TR -S/P mTEER 7/20  MR 4+ -> 2+    Length of Stay: 11  FINCH, LINDSAY N, PA-C  04/19/2022, 8:33 AM  Advanced Heart Failure Team Pager [phone] (M-F; 7a - 5p)  Please contact [facility] for  night-coverage after hours (5p -7a ) and weekends on [email or web address]  Patient seen and examined with the above-signed Advanced Practice Provider and/or Housestaff. I personally reviewed laboratory data, imaging studies and relevant notes. I independently examined the patient and formulated the important aspects of the plan. I have edited the note to reflect any of my changes or salient points. I have personally discussed the plan with the patient and/or family.  Off milrinone. Co-ox 82% (?). Diuresing well on IV lasix. CVP still 9-10. Denies CP, SOB, orthopnea or PND.   General:  Sitting up No resp difficulty HEENT: normal Neck: supple. no JVD. Carotids 2+ bilat; no bruits. No lymphadenopathy or thryomegaly appreciated. Cor: PMI nondisplaced. Regular rate & rhythm. No rubs, gallops or murmurs. Lungs: clear Abdomen: soft, nontender, nondistended. No hepatosplenomegaly. No bruits or masses. Good bowel sounds. Extremities: no cyanosis, clubbing, rash, 2+ edema Neuro: alert & orientedx3, cranial nerves grossly intact. moves all 4 extremities w/o difficulty. Affect pleasant  Will continue IV diuresis. Repeat co-ox. Can move out of ICU. Titrate GDMT. Will have to focus on improving social issues to  try to enable him to qualify for transplant down the road.   Arvilla Meres, MD  9:00 AM

## 2022-04-19 NOTE — Plan of Care (Signed)

## 2022-04-20 ENCOUNTER — Other Ambulatory Visit (HOSPITAL_COMMUNITY): Payer: Self-pay

## 2022-04-20 DIAGNOSIS — I5023 Acute on chronic systolic (congestive) heart failure: Secondary | ICD-10-CM | POA: Diagnosis not present

## 2022-04-20 LAB — CBC
HCT: 36.3 % — ABNORMAL LOW (ref 39.0–52.0)
Hemoglobin: 11.5 g/dL — ABNORMAL LOW (ref 13.0–17.0)
MCH: 27.4 pg (ref 26.0–34.0)
MCHC: 31.7 g/dL (ref 30.0–36.0)
MCV: 86.4 fL (ref 80.0–100.0)
Platelets: 208 10*3/uL (ref 150–400)
RBC: 4.2 MIL/uL — ABNORMAL LOW (ref 4.22–5.81)
RDW: 17.7 % — ABNORMAL HIGH (ref 11.5–15.5)
WBC: 9.6 10*3/uL (ref 4.0–10.5)
nRBC: 0.3 % — ABNORMAL HIGH (ref 0.0–0.2)

## 2022-04-20 LAB — BASIC METABOLIC PANEL
Anion gap: 6 (ref 5–15)
BUN: 25 mg/dL — ABNORMAL HIGH (ref 6–20)
CO2: 28 mmol/L (ref 22–32)
Calcium: 8.6 mg/dL — ABNORMAL LOW (ref 8.9–10.3)
Chloride: 107 mmol/L (ref 98–111)
Creatinine, Ser: 1.11 mg/dL (ref 0.61–1.24)
GFR, Estimated: >60 mL/min (ref >60)
Glucose, Bld: 162 mg/dL — ABNORMAL HIGH (ref 70–99)
Potassium: 4.4 mmol/L (ref 3.5–5.1)
Sodium: 141 mmol/L (ref 135–145)

## 2022-04-20 LAB — COOXEMETRY PANEL
Carboxyhemoglobin: 1.3 % (ref 0.5–1.5)
Methemoglobin: 1 % (ref 0.0–1.5)
O2 Saturation: 64.7 %
Total hemoglobin: 12 g/dL (ref 12.0–16.0)

## 2022-04-20 MED ORDER — TORSEMIDE 20 MG PO TABS
40.0000 mg | ORAL_TABLET | Freq: Every day | ORAL | Status: DC
Start: 2022-04-20 — End: 2022-04-20
  Administered 2022-04-20: 40 mg via ORAL
  Filled 2022-04-20: qty 2

## 2022-04-20 MED ORDER — TORSEMIDE 20 MG PO TABS
40.0000 mg | ORAL_TABLET | Freq: Every day | ORAL | 0 refills | Status: DC
  Filled 2022-04-20: qty 60, 30d supply, fill #0

## 2022-04-20 MED ORDER — FUROSEMIDE 40 MG PO TABS
40.0000 mg | ORAL_TABLET | Freq: Two times a day (BID) | ORAL | Status: DC
Start: 2022-04-20 — End: 2022-04-20

## 2022-04-20 MED ORDER — DIGOXIN 125 MCG PO TABS
0.1250 mg | ORAL_TABLET | Freq: Every day | ORAL | 6 refills | Status: DC
  Filled 2022-04-20 – 2022-05-04 (×2): qty 30, 30d supply, fill #0

## 2022-04-20 MED ORDER — SPIRONOLACTONE 25 MG PO TABS
25.0000 mg | ORAL_TABLET | Freq: Every day | ORAL | 6 refills | Status: DC
  Filled 2022-04-20 – 2022-05-16 (×2): qty 30, 30d supply, fill #0
  Filled 2022-06-14: qty 30, 30d supply, fill #1
  Filled 2022-07-13: qty 30, 30d supply, fill #2
  Filled 2022-08-12: qty 30, 30d supply, fill #3
  Filled 2022-09-06: qty 30, 30d supply, fill #4

## 2022-04-20 MED ORDER — AMIODARONE HCL 200 MG PO TABS
200.0000 mg | ORAL_TABLET | Freq: Every day | ORAL | Status: DC
Start: 2022-04-20 — End: 2022-04-20
  Administered 2022-04-20: 200 mg via ORAL
  Filled 2022-04-20: qty 1

## 2022-04-20 MED ORDER — SPIRONOLACTONE 25 MG PO TABS
25.0000 mg | ORAL_TABLET | Freq: Every day | ORAL | Status: DC
  Administered 2022-04-20: 25 mg via ORAL
  Filled 2022-04-20: qty 1

## 2022-04-20 MED ORDER — ASPIRIN 81 MG PO TBEC
81.0000 mg | DELAYED_RELEASE_TABLET | Freq: Every day | ORAL | 6 refills | Status: AC
  Filled 2022-04-20: qty 30, 30d supply, fill #0
  Filled 2022-06-11: qty 100, 100d supply, fill #0

## 2022-04-20 MED ORDER — AMIODARONE HCL 200 MG PO TABS
200.0000 mg | ORAL_TABLET | Freq: Every day | ORAL | 0 refills | Status: DC
  Filled 2022-04-20: qty 30, 30d supply, fill #0

## 2022-04-20 NOTE — Progress Notes (Addendum)
Advanced Heart Failure Rounding Note  PCP-Cardiologist: Dr. Haroldine Laws   Subjective:    Echo 04/10/22 EF 25-30% Severe MR/TR. Moderate RV HK   7/17 RHC/LHC - on DBA 5. Stable 1V CAD, moderate mixed pulmonary HTN, moderately reduced CO on DBA 5, prominent v waves in PCWP tracing suggestive of significant MR 7/19 IABP and swan placed for optimization. PAPi Switched from [place] to Milrinone 0.375 mcg. 7/20 S/P mTEER 7/21 IABP out 7/23 Milrinone off. SWAN out.   Yesterday moved out of ICU. Diuresed with IV lasix. Brisk diuresis. Negative 4.5 liters. CVP 6 today. Weight down another 7 pounds.   CO-OX 65%.   Hungry. Denies SOB.    Objective:   Weight Range: 61.8 kg Body mass index is 20.72 kg/m.   Vital Signs:   Temp:  [96.7 F (35.9 C)-97.8 F (36.6 C)] 97.8 F (36.6 C) (07/25 0426) Pulse Rate:  [65-81] 72 (07/25 0426) Resp:  [13-22] 18 (07/25 0426) BP: (94-125)/(58-81) 108/81 (07/25 0426) SpO2:  [91 %-100 %] 94 % (07/25 0426) Weight:  [61.8 kg] 61.8 kg (07/25 0138) Last BM Date : 04/19/22  Weight change: Filed Weights   04/18/22 1700 04/19/22 0500 04/20/22 0138  Weight: 69.1 kg 65.3 kg 61.8 kg    Intake/Output:   Intake/Output Summary (Last 24 hours) at 04/20/2022 0716 Last data filed at 04/20/2022 0413 Gross per 24 hour  Intake 1430 ml  Output 6025 ml  Net -4595 ml     CVP 6  Physical Exam  General:  Well appearing. No resp difficulty. In bed.  HEENT: normal Neck: supple. no JVD. Carotids 2+ bilat; no bruits. No lymphadenopathy or thryomegaly appreciated. LIJ Cor: PMI nondisplaced. Regular rate & rhythm. No rubs, gallops or murmurs. Lungs: clear Abdomen: soft, nontender, nondistended. No hepatosplenomegaly. No bruits or masses. Good bowel sounds. Extremities: no cyanosis, clubbing, rash, trace lower extremity edema. RUE PICC.  Neuro: alert & orientedx3, cranial nerves grossly intact. moves all 4 extremities w/o difficulty. Affect pleasant   Telemetry    SR 60-70s personally checked  Labs    CBC Recent Labs    04/19/22 0516 04/20/22 0110  WBC 9.1 9.6  HGB 10.3* 11.5*  HCT 32.5* 36.3*  MCV 87.1 86.4  PLT 166 [id number]   Basic Metabolic Panel Recent Labs    04/19/22 0516 04/20/22 0110  NA 143 141  K 3.5 4.4  CL 110 107  CO2 29 28  GLUCOSE 87 162*  BUN 15 25*  CREATININE 1.08 1.11  CALCIUM 8.1* 8.6*  MG 2.2  --    Liver Function Tests No results for input(s): "AST", "ALT", "ALKPHOS", "BILITOT", "PROT", "ALBUMIN" in the last 72 hours. No results for input(s): "LIPASE", "AMYLASE" in the last 72 hours. Cardiac Enzymes No results for input(s): "CKTOTAL", "CKMB", "CKMBINDEX", "TROPONINI" in the last 72 hours.  BNP: BNP (last 3 results) Recent Labs    02/12/22 1432 03/09/22 1540 04/08/22 2055  BNP 562.1* 503.3* 3,236.0*    ProBNP (last 3 results) No results for input(s): "PROBNP" in the last 8760 hours.   D-Dimer No results for input(s): "DDIMER" in the last 72 hours. Hemoglobin A1C No results for input(s): "HGBA1C" in the last 72 hours. Fasting Lipid Panel No results for input(s): "CHOL", "HDL", "LDLCALC", "TRIG", "CHOLHDL", "LDLDIRECT" in the last 72 hours. Thyroid Function Tests No results for input(s): "TSH", "T4TOTAL", "T3FREE", "THYROIDAB" in the last 72 hours.  Invalid input(s): "FREET3"  Other results:   Imaging    No results found.  Medications:     Scheduled Medications:  amiodarone  200 mg Oral BID   aspirin EC  81 mg Oral Daily   atorvastatin  40 mg Oral Daily   Chlorhexidine Gluconate Cloth  6 each Topical Daily   dapagliflozin propanediol  10 mg Oral Daily   digoxin  0.125 mg Oral Daily   enoxaparin (LOVENOX) injection  40 mg Subcutaneous Q24H   furosemide  80 mg Intravenous BID   potassium chloride  40 mEq Oral BID   sodium chloride flush  3 mL Intravenous Q12H   spironolactone  12.5 mg Oral Daily    Infusions:  sodium chloride 10 mL/hr at 04/20/22 0413   sodium chloride       PRN Medications: sodium chloride, sodium chloride, acetaminophen **OR** acetaminophen, LORazepam, ondansetron **OR** ondansetron (ZOFRAN) IV, polyethylene glycol, sodium chloride flush    Patient Profile   61 y.o. 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 Cath with 1-v CAD with chronically occluded RCA. MRI in January 2011 with EF 31% with inferior scar. Recent admit 4/23 for a/c CHF w/ low output, requiring DBA. He is end-stage and inotrope dependent, on home DBA. Not a good candidate for advanced therapies with lack of support system, and non-citizen status and does not want this. Has been followed by home hospice.   Presented to ED w/ CC of progressive dyspnea and increased supp O2 requirements.   Assessment/Plan   1. Acute on Chronic Systolic Heart Failure - Cath 1-v CAD chronically occluded RCA. MRI in January 2011 EF 31% with inferior scar.  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   - Echo (8/13): EF 45-50% .  - No f/u between to 2015-2021 - Echo (6/21): EF 20-25% severe central MR. Repeat cath 6/21 with stable CAD - Echo (4/22): EF 40-45% mild to mod MR RV mild HK  - Echo (4/23): EF 30-35%.   - Admit 4/23 with shock, started on DBA and lasix. Diuresed 20 lbs. Unable to wean DBA discharged on home DBA 5 mcg/kg/min with hospice care - Admitted 7/23 with a/c HF.Echo 04/10/22 EF 25-30% Severe MR/TR. Moderate RV HK  - RHC/LHC with stable 1V disease, moderate mixed pulmonary HTN, moderately reduced CO on DBA 5, and prominent v waves in PCWP tracing suggestive of significant MR - Able to obtain Medicaid coverage - 7/19 had IABP + swan placed. PA pressures elevated on DBA. Switched to Milrinone.0.375 mcg. - Felt not to be candidate for VAD/transplant due to compliance issues and lack of caregiver support -> underwent mTEER on 7/20 with excellent result - IABP  pulled 7/21. SWAN out. Milrinone stopped on 7/23.   - CO-OX remains stable off milrinone.  - Appears euvolemic. CVP 6 today. Stop IV lasix. Start lasix  40 mg twice a day.  - Continue digoxin 0.125 mg daily.  - Hold off on bb for now. Consider down the road.  - Increase spiro to 25 mg  daily.  - Continue Farxiga. - Hold off on entresto.  - TED hose. - BP too soft to further titrate GDMT.  2. AKI on CKD Stage IIIa likely due to ATN/overdiuresis - Creatinine baseline 1.6 - 1.82 on admit>>1.1 - resolved   3. PVCs - Continue amio 200 mg bid. - PVCs suppressed.   4. CAD  - Cath this admit stable with 1-v CAD with chronically occluded RCA - No chest pain.  - Continue ASA  and atorvastatin.   5. Transaminitis - suspect due to passive congestion vs low output (co-ox ok)  6. Severe MR/TR -S/P mTEER 7/20  MR 4+ -> 2+    Length of Stay: 12  Amy Clegg, NP  04/20/2022, 7:16 AM  Advanced Heart Failure Team Pager [phone] (M-F; 7a - 5p)  Please contact [facility] for night-coverage after hours (5p -7a ) and weekends on [email or web address]  Patient seen and examined with the above-signed Advanced Practice Provider and/or Housestaff. I personally reviewed laboratory data, imaging studies and relevant notes. I independently examined the patient and formulated the important aspects of the plan. I have edited the note to reflect any of my changes or salient points. I have personally discussed the plan with the patient and/or family.  Diuresed well. Co-ox 65% off inotropes. CVP looks good. Denies SOB, orthopnea or PND.   General:  Well appearing. No resp difficulty HEENT: normal Neck: supple. no JVD. Carotids 2+ bilat; no bruits. No lymphadenopathy or thryomegaly appreciated. Cor: PMI nondisplaced. Regular rate & rhythm. No rubs, gallops or murmurs. Lungs: clear Abdomen: soft, nontender, nondistended. No hepatosplenomegaly. No bruits or masses. Good bowel sounds. Extremities: no cyanosis,  clubbing, rash, edema Neuro: alert & orientedx3, cranial nerves grossly intact. moves all 4 extremities w/o difficulty. Affect pleasant  Much improved. Possible d/c today or tomorrow - hopefully today if we can get meds.  Arvilla Meres, MD  8:26 AM

## 2022-04-20 NOTE — Progress Notes (Signed)
CARDIAC REHAB PHASE I      Home education including risk factors,exercise guidelines, CHF booklet, restrictions, heart healthy low sodium diet, and CRP2. Pt expressed understanding of information. Not interested in CRP2 at this time. Will follow up with Dr if he is interested at a later time. Plan for home today. No DME needed per pt for home.    [id number]  Woodroe Chen, RN BSN 04/20/2022 10:39 AM

## 2022-04-20 NOTE — TOC Initial Note (Addendum)
Transition of Care [facility]) - Initial/Assessment Note    Patient Details  Name: Zachary Arias MRN: [id number] Date of Birth: 05/13/1961  [facility] CM/SW Contact:    Elliot Cousin, RN Phone Number: [phone] 04/20/2022, 12:47 PM  Clinical Narrative:                 HF TOC CM spoke to pt at bedside. Provided a taxi voucher for home. Meds to come up from [facility] pharmacy. Updated Unit RN. Pt reports he can use his Medicaid for transportation. Has contact information at home.   Scheduled appt with pt's PCP, Theophilus Kinds. Will place copy of insurance card on chart.   Cancelled referral for [facility].   Pt states he is getting Long Term Disability from his previous employer. His SS disability will need to get them information that states he is a permanent resident of [place]. Explained to bring paperwork with him to HF appt to see CSW, [place].   Expected Discharge Plan: Home/Self Care Barriers to Discharge: No Barriers Identified   Patient Goals and CMS Choice   CMS Medicare.gov Compare Post Acute Care list provided to:: Patient Choice offered to / list presented to : Patient  Expected Discharge Plan and Services Expected Discharge Plan: Home/Self Care   Discharge Planning Services: CM Consult, Follow-up appt scheduled Post Acute Care Choice: NA Living arrangements for the past 2 months: Apartment Expected Discharge Date: 04/20/22                         [facility] Arranged: NA      Prior Living Arrangements/Services Living arrangements for the past 2 months: Apartment Lives with:: Self Patient language and need for interpreter reviewed:: Yes Do you feel safe going back to the place where you live?: Yes      Need for Family Participation in Patient Care: No (Comment) Care giver support system in place?: No (comment) Current home services: Home RN Criminal Activity/Legal Involvement Pertinent to Current Situation/Hospitalization: No - Comment as needed  Activities  of Daily Living Home Assistive Devices/Equipment: None ADL Screening (condition at time of admission) Patient's cognitive ability adequate to safely complete daily activities?: Yes Is the patient deaf or have difficulty hearing?: No Does the patient have difficulty seeing, even when wearing glasses/contacts?: No Does the patient have difficulty concentrating, remembering, or making decisions?: No Patient able to express need for assistance with ADLs?: Yes Does the patient have difficulty dressing or bathing?: No Independently performs ADLs?: Yes (appropriate for developmental age) Does the patient have difficulty walking or climbing stairs?: Yes Weakness of Legs: None Weakness of Arms/Hands: None  Permission Sought/Granted Permission sought to share information with : Case Manager, Family Supports, PCP Permission granted to share information with : Yes, Verbal Permission Granted  Share Information with NAME: Emerick Weatherly     Permission granted to share info w Relationship: brother  Permission granted to share info w Contact Information: [phone]  Emotional Assessment Appearance:: Appears stated age Attitude/Demeanor/Rapport: Engaged Affect (typically observed): Accepting Orientation: : Oriented to Self, Oriented to Place, Oriented to  Time, Oriented to Situation   Psych Involvement: No (comment)  Admission diagnosis:  Acute on chronic combined systolic and diastolic congestive heart failure (HCC) [I50.43] Acute on chronic systolic (congestive) heart failure (HCC) [I50.23] Patient Active Problem List   Diagnosis Date Noted   Severe mitral regurgitation 04/15/2022   S/P mitral valve clip implantation 04/15/2022   Frequent PVCs 04/10/2022  Acute kidney injury superimposed on chronic kidney disease (HCC) 04/09/2022   Acute on chronic respiratory failure with hypoxia (HCC) 04/09/2022   Goals of care, counseling/discussion    Acute on chronic systolic (congestive) heart  failure (HCC) 04/08/2022   Right ankle pain 01/27/2022   Dyslipidemia 01/27/2022   Hypotension 03/05/2020   Hyperkalemia 03/05/2020   Elevated WBC count 03/05/2020   Chronic bilateral pleural effusions    Coronary artery disease involving native coronary artery of native heart without angina pectoris 07/22/2009   SYSTOLIC HEART FAILURE, CHRONIC 07/22/2009   HYPERTENSION 07/07/2009   CAD 07/07/2009   TACHYCARDIA 07/07/2009   PCP:  Default, Provider, MD Pharmacy:   [facility] - [place], [place] - [address] [place] [address] Phone: [phone] Fax: [phone]  [facility] [address] [place] [address] Phone: [phone] Fax: [phone]  [facility] [address] [place] [address] Phone: [phone] Fax: [phone]  [facility] [address] [place] [address] Phone: [phone] Fax: [phone]     Social Determinants of Health (SDOH) Interventions    Readmission Risk Interventions     No data to display

## 2022-04-20 NOTE — Progress Notes (Signed)
   Discussed with [name]   HF meds for d/c  Torsemide 40 mg daily  Spironolactone 25 mg daily  Amiodarone 200 mg daily  Digoxin 0.125 mg daily  Atrovastatin 40 mg daily  Aspirin 81 mg daily  Farxiga 10 mg daily   We will set up HF follow up.   Tran Randle Np-C 9:02 AM

## 2022-04-20 NOTE — Plan of Care (Signed)

## 2022-04-20 NOTE — Progress Notes (Signed)
PICC removal order verified with primary RN and pulled per protocol. Dressing remains clean/dry/intact, no complications noted. Patient instructed to remain flat in bed for , verbalizes understanding.

## 2022-04-21 ENCOUNTER — Other Ambulatory Visit: Payer: Self-pay | Admitting: Cardiology

## 2022-04-21 DIAGNOSIS — Z9889 Other specified postprocedural states: Secondary | ICD-10-CM

## 2022-04-21 DIAGNOSIS — I340 Nonrheumatic mitral (valve) insufficiency: Secondary | ICD-10-CM

## 2022-04-23 ENCOUNTER — Encounter (HOSPITAL_COMMUNITY): Payer: Self-pay | Admitting: Internal Medicine

## 2022-04-28 ENCOUNTER — Encounter (HOSPITAL_COMMUNITY): Payer: Self-pay | Admitting: Internal Medicine

## 2022-04-28 ENCOUNTER — Other Ambulatory Visit (HOSPITAL_COMMUNITY): Payer: Self-pay

## 2022-04-29 ENCOUNTER — Other Ambulatory Visit (HOSPITAL_COMMUNITY): Payer: Self-pay

## 2022-04-29 MED ORDER — DAPAGLIFLOZIN PROPANEDIOL 10 MG PO TABS
10.0000 mg | ORAL_TABLET | Freq: Every day | ORAL | 2 refills | Status: DC
  Filled 2022-04-29: qty 30, 30d supply, fill #0

## 2022-04-29 NOTE — Discharge Summary (Signed)
Physician Discharge Summary  Zachary Arias [id number] DOB: 08-24-1961 DOA: 04/08/2022  PCP: Default, Provider, MD  Admit date: 04/08/2022 Discharge date: 04/20/2022  Time spent: 66minutes  Recommendations for Outpatient Follow-up:  Advanced heart failure clinic on 8/8 PCP Dr. Percell Miller 8/7 Please check BMP at follow-up   Discharge Diagnoses:  Principal Problem:   Acute on chronic systolic (congestive) heart failure ([place]) Cardiogenic shock Low output heart failure   Coronary artery disease involving native coronary artery of native heart without angina pectoris   Acute kidney injury superimposed on chronic kidney disease ([place])   Acute on chronic respiratory failure with hypoxia (HCC)   Frequent PVCs   Goals of care, counseling/discussion   Severe mitral regurgitation   S/P mitral valve clip implantation   Discharge Condition: Improved  Diet recommendation: Low-sodium, heart healthy  Filed Weights   04/18/22 1700 04/19/22 0500 04/20/22 0138  Weight: 69.1 kg 65.3 kg 61.8 kg    History of present illness:  60/M with history of chronic systolic CHF EF [id number] end-stage inotrope dependent on dobutamine, CAD with PCI and stents, chronic hypoxic respiratory failure on 2 L home O2, CKD 3 a presented to the ED with dyspnea.  Has been on continuous dobutamine infusion for low output heart failure since April, also followed by hospice. -In the ED volume overloaded, started on diuretics, 7/17 underwent cardiac cath cardiac cath which noted stable coronary disease, moderately reduced cardiac output and prominent V wave and manage pressures suggesting significant MR, TEE 7/18 noted severe mitral regurgitation   Hospital Course:   Acute on chronic systolic (congestive) heart failure (HCC) Severe mitral regurgitation 04/23 echo with LV EF 30-35%, global hypokinesis, preserved RV systolic function, severe enlargement of pulmonary artery, Severe LA enlargement. Severe mitral regurgitation,   -Mixed cardiomyopathy, cardiac MRI 09/2009 with EF of 31%, inferior scar -Considered end-stage heart failure, inotrope dependent on home dobutamine -Right heart cath/LHC this admission noted stable one-vessel disease, moderate pulmonary hypertension, moderately reduced CO on dobutamine and severe MR, -TEE 7/18 -severe MR, structural heart team consulted for mitral valve clip -Per heart failure team, balloon pump placed 7/19, dobutamine discontinued, then started milrinone, s/p TEER with mitral clip 7/20 with good results -Balloon pump removed 7/21 -Remains on digoxin, Farxiga, Aldactone, -Milrinone discontinued yesterday, transferred out of ICU -Started Lasix 40 Mg twice daily, continue digoxin spironolactone and Farxiga at discharge -Follow-up with heart failure clinic in 2 weeks   Coronary artery disease involving native coronary artery of native heart without angina pectoris Continue with aspirin and statin.  -LHC with one-vessel CAD, chronically occluded RCA   AKI on CKD 3a -Baseline creatinine around 1.6,  -Creatinine has normalized on inotropes   Acute on chronic respiratory failure with hypoxia (HCC) Patient with severe pulmonary hypertension. -Improving, on 2 L home O2   Frequent PVCs -Remains on amiodarone   Goals of care, counseling/discussion Followed by hospice , now w/ advanced cardiac interventions Code status is DNR    Consultants: Advanced heart failure team structural heart team     Procedures: 7/17 RHC/LHC - on DBA 5. Stable 1V CAD, moderate mixed pulmonary HTN, moderately reduced CO on DBA 5, prominent v waves in PCWP tracing suggestive of significant MR  TEE 7/19  LVEF 25% severe central MR. RV moderately down.     7/19 balloon pump placed for optimization  7/20, mTEER  7/21, balloon pump removed      Discharge Exam: Vitals:   04/20/22 1133 04/20/22 1729  BP: 108/70 109/69  Pulse:  75 82  Resp: 20 18  Temp: 97.9 F (36.6 C) 97.6 F (36.4 C)   SpO2: 96% 100%   General exam: Chronically ill male sitting up in bed eating breakfast, AAOx3, no distress HEENT: No JVD CVS: S1-S2, regular rhythm, systolic murmur Lungs: Clear bilaterally Abdomen: Soft, nontender, bowel sounds present,  extremities: 1+ edema  Psychiatry:  Mood & affect appropriate.     Discharge Instructions   Discharge Instructions     Diet - low sodium heart healthy   Complete by: As directed    Increase activity slowly   Complete by: As directed    No wound care   Complete by: As directed       Allergies as of 04/20/2022       Reactions   Avocado Nausea Only   Shellfish Allergy Nausea And Vomiting        Medication List     STOP taking these medications    DOBUTamine 4-5 MG/ML-% infusion Commonly known as: DOBUTREX       TAKE these medications    amiodarone 200 MG tablet Commonly known as: PACERONE Take 1 tablet (200 mg total) by mouth daily. What changed: when to take this   Aspirin Low Dose 81 MG tablet Generic drug: aspirin EC Take 1 tablet (81 mg total) by mouth daily. What changed: medication strength   atorvastatin 40 MG tablet Commonly known as: LIPITOR Take 1 tablet (40 mg total) by mouth daily.   digoxin 0.125 MG tablet Commonly known as: LANOXIN Take 1 tablet (0.125 mg total) by mouth daily. What changed: how much to take   LORazepam 0.5 MG tablet Commonly known as: ATIVAN Take 0.5 mg by mouth every 4 (four) hours as needed for anxiety (SOB).   multivitamin capsule Take 1 capsule by mouth daily.   potassium chloride SA 20 MEQ tablet Commonly known as: KLOR-CON M Take 2 tablets (40 mEq total) by mouth daily.   spironolactone 25 MG tablet Commonly known as: ALDACTONE Take 1 tablet (25 mg total) by mouth daily. What changed: how much to take   torsemide 20 MG tablet Commonly known as: DEMADEX Take 2 tablets (40 mg total) by mouth daily.       Allergies  Allergen Reactions   Avocado Nausea Only    Shellfish Allergy Nausea And Vomiting    Follow-up Information     [facility] Follow up on 05/04/2022.   Specialty: Cardiology Why: 9:30 Contact information: [address] [id number] [place]        Governor Specking, MD Follow up.   Specialty: General Practice Why: appt scheduled for 05/03/2022 at 2:30 pm Contact information: [address] [place] [address] [phone]         HF CSW Eliezer Lofts Follow up.   Why: please bring disability paperwork to review with HF CSW [place]                 The results of significant diagnostics from this hospitalization (including imaging, microbiology, ancillary and laboratory) are listed below for reference.    Significant Diagnostic Studies: ECHOCARDIOGRAM COMPLETE  Result Date: 04/16/2022    ECHOCARDIOGRAM REPORT   Patient Name:   Zachary Arias Date of Exam: 04/16/2022 Medical Rec #:  [id number]     Height:       68.0 in Accession #:    [id number]    Weight:  139.1 lb Date of Birth:  April 23, 1961     BSA:          1.752 m Patient Age:    75 years      BP:           98/58 mmHg Patient Gender: M             HR:           86 bpm. Exam Location:  Inpatient Procedure: 2D Echo, Cardiac Doppler and Color Doppler Indications:    S/P Mitral valve clip implantation  History:        Patient has prior history of Echocardiogram examinations, most                 recent 04/10/2022. CAD; Risk Factors:Hypertension.  Sonographer:    Jefferey Pica Referring Phys: [phone] JILL D MCDANIEL IMPRESSIONS  1. Left ventricular ejection fraction, by estimation, is 20 to 25%. The left ventricle has severely decreased function. The left ventricle demonstrates global hypokinesis. The left ventricular internal cavity size was severely dilated. Left ventricular diastolic parameters are indeterminate.  2. Right ventricular systolic function is normal. The right ventricular size is  normal. There is mildly elevated pulmonary artery systolic pressure.  3. Left atrial size was moderately dilated.  4. Post mitral clip procedure with 2 XTW clips straddling A2/P2 Mean gradient only 3 peak 7 mmHg when patient in atrial bigeminy recorded HR not accurate due to ectopy. Mild to moderate residual MR Jet eminates lateral to clips . The mitral valve has been repaired/replaced. No evidence of mitral valve regurgitation. No evidence of mitral stenosis.  5. Tricuspid valve regurgitation is severe.  6. The aortic valve is tricuspid. Aortic valve regurgitation is not visualized. No aortic stenosis is present.  7. The inferior vena cava is normal in size with greater than 50% respiratory variability, suggesting right atrial pressure of 3 mmHg. FINDINGS  Left Ventricle: Left ventricular ejection fraction, by estimation, is 20 to 25%. The left ventricle has severely decreased function. The left ventricle demonstrates global hypokinesis. The left ventricular internal cavity size was severely dilated. There is no left ventricular hypertrophy. Left ventricular diastolic parameters are indeterminate. Right Ventricle: The right ventricular size is normal. No increase in right ventricular wall thickness. Right ventricular systolic function is normal. There is mildly elevated pulmonary artery systolic pressure. The tricuspid regurgitant velocity is 2.56  m/s, and with an assumed right atrial pressure of 15 mmHg, the estimated right ventricular systolic pressure is [id number] mmHg. Left Atrium: Left atrial size was moderately dilated. Right Atrium: Right atrial size was normal in size. Pericardium: There is no evidence of pericardial effusion. Mitral Valve: Post mitral clip procedure with 2 XTW clips straddling A2/P2 Mean gradient only 3 peak 7 mmHg when patient in atrial bigeminy recorded HR not accurate due to ectopy. Mild to moderate residual MR Jet eminates lateral to clips. The mitral valve has been repaired/replaced. No  evidence of mitral valve regurgitation. No evidence of mitral valve stenosis. MV peak gradient, 7.2 mmHg. The mean mitral valve gradient is 3.5 mmHg. Tricuspid Valve: The tricuspid valve is normal in structure. Tricuspid valve regurgitation is severe. No evidence of tricuspid stenosis. Aortic Valve: The aortic valve is tricuspid. Aortic valve regurgitation is not visualized. No aortic stenosis is present. Aortic valve peak gradient measures 5.0 mmHg. Pulmonic Valve: The pulmonic valve was normal in structure. Pulmonic valve regurgitation is mild. No evidence of pulmonic stenosis. Aorta: The aortic root is normal in size  and structure. Venous: The inferior vena cava is normal in size with greater than 50% respiratory variability, suggesting right atrial pressure of 3 mmHg. IAS/Shunts: No atrial level shunt detected by color flow Doppler.  LEFT VENTRICLE PLAX 2D LVIDd:         6.20 cm LVIDs:         5.70 cm LV PW:         1.20 cm LV IVS:        0.90 cm LVOT diam:     2.00 cm LV SV:         56 LV SV Index:   32 LVOT Area:     3.14 cm  RIGHT VENTRICLE             IVC RV Basal diam:  3.50 cm     IVC diam: 2.40 cm RV S prime:     11.90 cm/s TAPSE (M-mode): 1.8 cm LEFT ATRIUM              Index         RIGHT ATRIUM           Index LA diam:        3.90 cm  2.23 cm/m    RA Area:     20.20 cm LA Vol (A2C):   200.0 ml 114.19 ml/m  RA Volume:   57.20 ml  32.66 ml/m LA Vol (A4C):   104.0 ml 59.38 ml/m LA Biplane Vol: 156.0 ml 89.07 ml/m  AORTIC VALVE                 PULMONIC VALVE AV Area (Vmax): 3.35 cm     PV Vmax:       0.73 m/s AV Vmax:        111.67 cm/s  PV Peak grad:  2.1 mmHg AV Peak Grad:   5.0 mmHg LVOT Vmax:      119.00 cm/s LVOT Vmean:     73.500 cm/s LVOT VTI:       0.178 m  AORTA Ao Root diam: 2.80 cm Ao Asc diam:  3.20 cm MITRAL VALVE                TRICUSPID VALVE MV Area (PHT): 1.88 cm     TR Peak grad:   26.2 mmHg MV Area VTI:   1.40 cm     TR Vmax:        256.00 cm/s MV Peak grad:  7.2 mmHg MV Mean  grad:  3.5 mmHg     SHUNTS MV Vmax:       1.35 m/s     Systemic VTI:  0.18 m MV Vmean:      86.2 cm/s    Systemic Diam: 2.00 cm MV Decel Time: 404 msec MR Peak grad: 45.7 mmHg MR Vmax:      338.00 cm/s MV E velocity: 116.00 cm/s MV A velocity: 117.00 cm/s MV E/A ratio:  0.99 Charlton Haws MD Electronically signed by Charlton Haws MD Signature Date/Time: 04/16/2022/11:00:50 AM    Final    DG CHEST PORT 1 VIEW  Result Date: 04/16/2022 CLINICAL DATA:  Chest pain. EXAM: PORTABLE CHEST 1 VIEW COMPARISON:  April 14, 2022. FINDINGS: Stable cardiomegaly. Left internal jugular catheter is now noted. Right internal jugular Swan-Ganz catheter is again noted with tip directed into right pulmonary artery. Distal tip of aortic balloon catheter appears to be in the expected position of the proximal descending thoracic aorta. No pneumothorax is noted. Right-sided PICC  line is noted. Lungs are clear. Bony thorax is unremarkable. IMPRESSION: Interval placement of left internal jugular catheter with distal tip in expected position of the SVC. Swan-Ganz catheter is unchanged. Distal tip of aortic balloon catheter seen in expected position of proximal descending thoracic aorta. Electronically Signed   By: Lupita Raider M.D.   On: 04/16/2022 08:36   CARDIAC CATHETERIZATION  Result Date: 04/15/2022 Successful transcatheter edges repair of the mitral valve using 2 MitraClip G4 XT W devices, first device positioned lateral side of A2/P2 and second device position the medial side of A2/P2 Recommend: Postoperative day #1 echo tomorrow Keep IABP in place overnight (discussed with Dr. Gala Romney) Aspirin 81 mg daily for at least 3 months   ECHO TEE  Result Date: 04/15/2022    TRANSESOPHOGEAL ECHO REPORT   Patient Name:   Zachary Arias Date of Exam: 04/15/2022 Medical Rec #:  [id number]     Height:       68.0 in Accession #:    [id number]    Weight:       140.7 lb Date of Birth:  Nov 18, 1960     BSA:          1.760 m Patient Age:    60  years      BP:           113/80 mmHg Patient Gender: M             HR:           83 bpm. Exam Location:  Inpatient Procedure: 3D Echo, Cardiac Doppler, Color Doppler and Transesophageal Echo Indications:    mitral valve insufficiency  History:        Patient has prior history of Echocardiogram examinations, most                 recent 04/10/2022. CHF, CAD; Risk Factors:Hypertension. Mitral                 Clip's implanted on 04/15/22, XTW and XTW.  Sonographer:    Leta Jungling RDCS Referring Phys: [id number] DANIEL R BENSIMHON PROCEDURE: The transesophogeal probe was passed without difficulty through the esophogus of the patient. Sedation performed by different physician. The patient developed no complications during the procedure. IMPRESSIONS  1. Intervential TEE for MitraClip.  2. Prior to procedure, there was severe ventricular functional mitral regurgitation (torrential). Regurgitant fraction 68% by 2D PISA, with right pulmonary vein systolic flow reversal. Mitral valve area 5.53 cm2.  3. Mid-mid transeptal puncture performed with no complication.  4. Attempt was made for NTW TEER intervention. Unable to grasp anterior and posterior leaflets.  5. Placement of an XTW Mitra-Clip in the lateral position. Mean gradient of 5 mm Hg at a heart rate of 89 bpm. Improved mitral regugitation, still severe. Double orifice area with combined valve area 4.15 cm2.  6. Placement of an XTW Mitra-Clip in the medial position. Mean gradient of 6 mm Hg at a heart rate of 84 bpm. Mild to moderate residual mitral regurgitation (+2). Resolution of right pulmonary vein systolic flow reversal. Triple orifice area combined valve area 3.3 cm2. Successful mTEER intervention.  7. Left to right shunting post procedure. No procedural pericardial effusion.  8. Left ventricular ejection fraction, by estimation, is 25 to 30%. The left ventricle has severely decreased function. The left ventricular internal cavity size was severely dilated.  9. Right  ventricular systolic function is normal. The right ventricular size is normal. 10. Left atrial size was severely dilated. No  left atrial/left atrial appendage thrombus was detected. 11. Right atrial size was severely dilated. 12. Tricuspid valve regurgitation is mild to moderate. 13. The aortic valve is tricuspid. Aortic valve regurgitation is trivial. No aortic stenosis is present. FINDINGS  Left Ventricle: Left ventricular ejection fraction, by estimation, is 25 to 30%. The left ventricle has severely decreased function. The left ventricular internal cavity size was severely dilated. Right Ventricle: The right ventricular size is normal. Right vetricular wall thickness was not well visualized. Right ventricular systolic function is normal. Left Atrium: Left atrial size was severely dilated. No left atrial/left atrial appendage thrombus was detected. Right Atrium: Right atrial size was severely dilated. Pericardium: Trivial pericardial effusion is present. Mitral Valve: The mitral valve is grossly normal. Severe mitral valve regurgitation. MV peak gradient, 8.0 mmHg. The mean mitral valve gradient is 3.5 mmHg. Tricuspid Valve: The tricuspid valve is normal in structure. Tricuspid valve regurgitation is mild to moderate. No evidence of tricuspid stenosis. Aortic Valve: The aortic valve is tricuspid. Aortic valve regurgitation is trivial. No aortic stenosis is present. Pulmonic Valve: The pulmonic valve was normal in structure. Pulmonic valve regurgitation is mild. Aorta: The aortic root is normal in size and structure. IAS/Shunts: Evidence of atrial level shunting detected by color flow Doppler.  MITRAL VALVE MV Area (plan): 4.93 cm MV Peak grad:   8.0 mmHg MV Mean grad:   3.5 mmHg MV Vmax:        1.42 m/s MV Vmean:       83.9 cm/s MR Peak grad:    92.4 mmHg MR Mean grad:    71.0 mmHg MR Vmax:         480.50 cm/s MR Vmean:        407.0 cm/s MR PISA:         0.57 cm MR PISA Eff ROA: 4 mm MR PISA Radius:  0.30 cm  Rudean Haskell MD Electronically signed by Rudean Haskell MD Signature Date/Time: 04/15/2022/4:59:22 PM    Final    ECHO TEE  Result Date: 04/15/2022    TRANSESOPHOGEAL ECHO REPORT   Patient Name:   Zachary Arias Date of Exam: 04/13/2022 Medical Rec #:  [id number]     Height:       68.0 in Accession #:    [id number]    Weight:       140.2 lb Date of Birth:  [birth year]     BSA:          1.757 m Patient Age:    69 years      BP:           98/75 mmHg Patient Gender: M             HR:           76 bpm. Exam Location:  Inpatient Procedure: Transesophageal Echo, 3D Echo, Color Doppler and Cardiac Doppler Indications:     Mitral Regurgitation  History:         Patient has prior history of Echocardiogram examinations, most                  recent 04/10/2022. CHF, CAD, Arrythmias:Tachycardia; Risk                  Factors:Hypertension.  Sonographer:     Bernadene Person RDCS Referring Phys:  [id number] Shaune Pascal BENSIMHON Diagnosing Phys: Glori Bickers MD PROCEDURE: After discussion of the risks and benefits of a TEE, an informed consent was obtained from the patient. The  transesophogeal probe was passed without difficulty through the esophogus of the patient. Sedation performed by different physician. The patient was monitored while under deep sedation. Anesthestetic sedation was provided intravenously by Anesthesiology: 257.62mg  of Propofol. The patient's vital signs; including heart rate, blood pressure, and oxygen saturation; remained stable throughout the procedure. The patient developed no complications during the procedure. IMPRESSIONS  1. Left ventricular ejection fraction, by estimation, is 25%. The left ventricle has severely decreased function. The left ventricle demonstrates global hypokinesis. The left ventricular internal cavity size was severely dilated.  2. Right ventricular systolic function is moderately reduced. The right ventricular size is normal.  3. Left atrial size was severely dilated. No left  atrial/left atrial appendage thrombus was detected.  4. Right atrial size was moderately dilated.  5. There is poor coaptation of the mitral valve leaflets leaflets due to annualr dilation with 4+ central MR and ssytolic flow reversal in the pulmonary veins. . The mitral valve is abnormal. Severe mitral valve regurgitation. No evidence of mitral stenosis.  6. Tricuspid valve regurgitation is moderate.  7. The aortic valve is tricuspid. Aortic valve regurgitation is trivial. FINDINGS  Left Ventricle: Left ventricular ejection fraction, by estimation, is 25%. The left ventricle has severely decreased function. The left ventricle demonstrates global hypokinesis. The left ventricular internal cavity size was severely dilated. Right Ventricle: The right ventricular size is normal. No increase in right ventricular wall thickness. Right ventricular systolic function is moderately reduced. Left Atrium: Left atrial size was severely dilated. No left atrial/left atrial appendage thrombus was detected. Right Atrium: Right atrial size was moderately dilated. Pericardium: There is no evidence of pericardial effusion. Mitral Valve: There is poor coaptation of the mitral valve leaflets leaflets due to annualr dilation with 4+ central MR and ssytolic flow reversal in the pulmonary veins. The mitral valve is abnormal. Severe mitral valve regurgitation, with centrally-directed jet. No evidence of mitral valve stenosis. MV peak gradient, 10.2 mmHg. The mean mitral valve gradient is 2.0 mmHg. Tricuspid Valve: The tricuspid valve is normal in structure. Tricuspid valve regurgitation is moderate. Aortic Valve: The aortic valve is tricuspid. Aortic valve regurgitation is trivial. Pulmonic Valve: The pulmonic valve was grossly normal. Pulmonic valve regurgitation is not visualized. Aorta: The aortic root is normal in size and structure. There is minimal (Grade I) plaque. IAS/Shunts: No atrial level shunt detected by color flow Doppler.   MITRAL VALVE MV Area (PHT): 10.00 cm MV Peak grad:  10.2 mmHg MV Mean grad:  2.0 mmHg MV Vmax:       1.60 m/s MV Vmean:      58.7 cm/s MR Peak grad:    125.4 mmHg MR Mean grad:    73.0 mmHg MR Vmax:         560.00 cm/s MR Vmean:        400.0 cm/s MR PISA:         2.26 cm MR PISA Eff ROA: 16 mm MR PISA Radius:  0.60 cm Glori Bickers MD Electronically signed by Glori Bickers MD Signature Date/Time: 04/15/2022/12:38:52 PM    Final    DG CHEST PORT 1 VIEW  Result Date: 04/14/2022 CLINICAL DATA:  Cardiogenic shock, shortness of breath EXAM: PORTABLE CHEST 1 VIEW COMPARISON:  04/09/2022 FINDINGS: 2 frontal views of the chest demonstrate an enlarged cardiac silhouette. Right-sided PICC tip overlies superior vena cava. Right internal jugular flow directed central venous catheter tip overlies right pulmonary artery. Metallic tip of an intra-aortic balloon pump is seen along the distal  margin of the aortic arch at the T5-6 level. Mild diffuse interstitial prominence compatible with edema. No airspace disease, effusion, or pneumothorax. No acute bony abnormality. IMPRESSION: 1. Support devices as above. 2. Enlarged cardiac silhouette. 3. Mild interstitial edema. Electronically Signed   By: Randa Ngo M.D.   On: 04/14/2022 17:18   CARDIAC CATHETERIZATION  Result Date: 04/14/2022 Findings: On DBA 5 Ao = 98/58 (71) RA = 11 RV = 56/14 PA = 63/24 (36) PCW = 26 (v = 32) Fick cardiac output/index = 3.5/2.0 Thermo CO/CI 3.1/1.8 PVR = 3.0 WU FA sat = 97% PA sat = 55%, 56% PAPi = 3.5 Assessment: 1. Low output HF with elevated filling pressures Plan/Discussion: IABP placed. For mTEER tomorrow. Continue to adjust inotropes. Glori Bickers, MD 3:50 PM  CARDIAC CATHETERIZATION  Result Date: 04/12/2022   Mid LAD lesion is 40% stenosed.   Mid Cx lesion is 30% stenosed.   Prox RCA lesion is 50% stenosed.   Dist RCA lesion is 100% stenosed.   1st Mrg lesion is 30% stenosed. Findings: On DBA 5 Ao = 89/68 (79) LV = 93/18  RA = 9 RV = 67/15 PA = 65/26 (39) PCW = 23 (v = 31) Fick cardiac output/index = 4.1/2.3 PVR = 3.9 WU FA sat = 98% PA sat = 64%, 65% PAPi = 4.3 Assessment: 1. Stable 1v CAD 2. Moderate mixed pulmonary HTN 3. Moderately reduced CO on DBA 5 4. Prominent v waves in PCWP tracing suggestive of significant MR Plan/Discussion: Plan TEE to further evaluate. Suspect he will need advanced therapies. Glori Bickers, MD 2:20 PM   ECHOCARDIOGRAM COMPLETE  Result Date: 04/10/2022    ECHOCARDIOGRAM REPORT   Patient Name:   Zachary Arias Date of Exam: 04/10/2022 Medical Rec #:  [id number]     Height:       68.0 in Accession #:    [id number]    Weight:       143.5 lb Date of Birth:  [birth year]     BSA:          1.775 m Patient Age:    50 years      BP:           93/73 mmHg Patient Gender: M             HR:           86 bpm. Exam Location:  Inpatient Procedure: 2D Echo, Cardiac Doppler, 3D Echo and Intracardiac Opacification            Agent Indications:    CHF  History:        Patient has prior history of Echocardiogram examinations, most                 recent 01/18/2022. CHF, CAD; Risk Factors:Hypertension. On                 dobutamine drip during the time of the echo.  Sonographer:    Merrie Roof RDCS Referring Phys: [place]  1. Left ventricular ejection fraction, by estimation, is 30 to 35%. The left ventricle has moderate to severely decreased function. The left ventricle demonstrates global hypokinesis. The left ventricular internal cavity size was moderately to severely dilated. There is mild left ventricular hypertrophy. Left ventricular diastolic parameters are consistent with Grade III diastolic dysfunction (restrictive). Elevated left atrial pressure.  2. Right ventricular systolic function is normal. The right ventricular size is mildly enlarged. There is severely elevated pulmonary  artery systolic pressure.  3. Left atrial size was severely dilated.  4. Right atrial size was severely  dilated.  5. Dilated ventricle leading to poor MV coaptation and severe functional MR.. The mitral valve is abnormal. Severe mitral valve regurgitation. No evidence of mitral stenosis.  6. Hepatic systolic flow reversal consistent with severe TR     . The tricuspid valve is abnormal. Tricuspid valve regurgitation is severe.  7. The aortic valve has an indeterminant number of cusps. Aortic valve regurgitation is not visualized. No aortic stenosis is present.  8. The inferior vena cava is dilated in size with <50% respiratory variability, suggesting right atrial pressure of 15 mmHg. FINDINGS  Left Ventricle: Left ventricular ejection fraction, by estimation, is 30 to 35%. The left ventricle has moderate to severely decreased function. The left ventricle demonstrates global hypokinesis. The left ventricular internal cavity size was moderately  to severely dilated. There is mild left ventricular hypertrophy. Left ventricular diastolic parameters are consistent with Grade III diastolic dysfunction (restrictive). Elevated left atrial pressure. Right Ventricle: The right ventricular size is mildly enlarged. Right vetricular wall thickness was not well visualized. Right ventricular systolic function is normal. There is severely elevated pulmonary artery systolic pressure. The tricuspid regurgitant velocity is 3.47 m/s, and with an assumed right atrial pressure of 15 mmHg, the estimated right ventricular systolic pressure is [id number] mmHg. Left Atrium: Left atrial size was severely dilated. Right Atrium: Right atrial size was severely dilated. Pericardium: There is no evidence of pericardial effusion. Mitral Valve: Dilated ventricle leading to poor MV coaptation and severe functional MR. The mitral valve is abnormal. There is mild thickening of the mitral valve leaflet(s). There is mild calcification of the mitral valve leaflet(s). Mild mitral annular  calcification. Severe mitral valve regurgitation. No evidence of mitral valve  stenosis. Tricuspid Valve: Hepatic systolic flow reversal consistent with severe TR. The tricuspid valve is abnormal. Tricuspid valve regurgitation is severe. No evidence of tricuspid stenosis. Aortic Valve: The aortic valve has an indeterminant number of cusps. Aortic valve regurgitation is not visualized. No aortic stenosis is present. Aortic valve mean gradient measures 2.4 mmHg. Aortic valve peak gradient measures 5.9 mmHg. Aortic valve area, by VTI measures 1.49 cm. Pulmonic Valve: The pulmonic valve was not well visualized. Pulmonic valve regurgitation is mild. No evidence of pulmonic stenosis. Aorta: The aortic root is normal in size and structure. Venous: The inferior vena cava is dilated in size with less than 50% respiratory variability, suggesting right atrial pressure of 15 mmHg. IAS/Shunts: The interatrial septum was not well visualized.  LEFT VENTRICLE PLAX 2D LVIDd:         6.80 cm LVIDs:         5.60 cm LV PW:         0.90 cm LV IVS:        0.80 cm LVOT diam:     2.00 cm      3D Volume EF: LV SV:         32           3D EF:        47 % LV SV Index:   18           LV EDV:       223 ml LVOT Area:     3.14 cm     LV ESV:       118 ml  LV SV:        105 ml  LV Volumes (MOD) LV vol d, MOD A2C: 196.0 ml LV vol d, MOD A4C: 173.0 ml LV vol s, MOD A2C: 106.0 ml LV vol s, MOD A4C: 89.5 ml LV SV MOD A2C:     90.0 ml LV SV MOD A4C:     173.0 ml LV SV MOD BP:      94.5 ml RIGHT VENTRICLE             IVC RV Basal diam:  4.30 cm     IVC diam: 2.70 cm RV Mid diam:    3.00 cm RV S prime:     17.30 cm/s TAPSE (M-mode): 2.4 cm LEFT ATRIUM              Index         RIGHT ATRIUM           Index LA diam:        5.20 cm  2.93 cm/m    RA Area:     23.20 cm LA Vol (A2C):   271.0 ml 152.69 ml/m  RA Volume:   74.70 ml  42.09 ml/m LA Vol (A4C):   99.6 ml  56.12 ml/m LA Biplane Vol: 179.0 ml 100.86 ml/m  AORTIC VALVE AV Area (Vmax):    1.66 cm AV Area (Vmean):   1.81 cm AV Area (VTI):      1.49 cm AV Vmax:           121.27 cm/s AV Vmean:          69.145 cm/s AV VTI:            0.215 m AV Peak Grad:      5.9 mmHg AV Mean Grad:      2.4 mmHg LVOT Vmax:         64.00 cm/s LVOT Vmean:        39.800 cm/s LVOT VTI:          0.102 m LVOT/AV VTI ratio: 0.47  AORTA Ao Root diam: 2.70 cm Ao Asc diam:  2.30 cm MR Peak grad:    76.7 mmHg    TRICUSPID VALVE MR Mean grad:    42.0 mmHg    TR Peak grad:   48.2 mmHg MR Vmax:         438.00 cm/s  TR Vmax:        347.00 cm/s MR Vmean:        300.0 cm/s MR PISA:         9.05 cm     SHUNTS MR PISA Eff ROA: 64 mm       Systemic VTI:  0.10 m MR PISA Radius:  1.20 cm      Systemic Diam: 2.00 cm Dina Rich MD Electronically signed by Dina Rich MD Signature Date/Time: 04/10/2022/5:29:36 PM    Final    DG CHEST PORT 1 VIEW  Result Date: 04/09/2022 CLINICAL DATA:  Shortness of breath, generalized weakness EXAM: PORTABLE CHEST 1 VIEW COMPARISON:  Previous studies including the examination of 04/08/2022 FINDINGS: Transfer diameter heart is increased. There is slight decrease in pulmonary vascular congestion. There are no signs of pulmonary edema or new focal infiltrates. There is no pleural effusion or pneumothorax. Tip of PICC line is seen in superior vena cava. IMPRESSION: Cardiomegaly. There is interval decrease in pulmonary vascular congestion. There are no signs of alveolar pulmonary edema or new focal infiltrates. Electronically Signed   By:  Elmer Picker M.D.   On: 04/09/2022 13:53   DG Chest 2 View  Result Date: 04/08/2022 CLINICAL DATA:  Shortness of breath EXAM: CHEST - 2 VIEW COMPARISON:  03/04/2020, 01/27/2022 FINDINGS: Cardiomegaly with vascular congestion. No focal airspace disease, pleural effusion, or pneumothorax IMPRESSION: Cardiomegaly with vascular congestion Electronically Signed   By: Donavan Foil M.D.   On: 04/08/2022 20:41    Microbiology: No results found for this or any previous visit (from the past 240 hour(s)).    Labs: Basic Metabolic Panel: No results for input(s): "NA", "K", "CL", "CO2", "GLUCOSE", "BUN", "CREATININE", "CALCIUM", "MG", "PHOS" in the last 168 hours. Liver Function Tests: No results for input(s): "AST", "ALT", "ALKPHOS", "BILITOT", "PROT", "ALBUMIN" in the last 168 hours. No results for input(s): "LIPASE", "AMYLASE" in the last 168 hours. No results for input(s): "AMMONIA" in the last 168 hours. CBC: No results for input(s): "WBC", "NEUTROABS", "HGB", "HCT", "MCV", "PLT" in the last 168 hours. Cardiac Enzymes: No results for input(s): "CKTOTAL", "CKMB", "CKMBINDEX", "TROPONINI" in the last 168 hours. BNP: BNP (last 3 results) Recent Labs    02/12/22 1432 03/09/22 1540 04/08/22 2055  BNP 562.1* 503.3* 3,236.0*    ProBNP (last 3 results) No results for input(s): "PROBNP" in the last 8760 hours.  CBG: No results for input(s): "GLUCAP" in the last 168 hours.     Signed:  Domenic Polite MD.  [facility] 04/29/2022, 1:34 PM

## 2022-05-04 ENCOUNTER — Other Ambulatory Visit (HOSPITAL_COMMUNITY): Payer: Self-pay

## 2022-05-04 ENCOUNTER — Encounter (HOSPITAL_COMMUNITY): Payer: Self-pay | Admitting: Internal Medicine

## 2022-05-04 ENCOUNTER — Telehealth (HOSPITAL_COMMUNITY): Payer: Self-pay | Admitting: *Deleted

## 2022-05-04 ENCOUNTER — Encounter (HOSPITAL_COMMUNITY): Payer: Medicaid Other

## 2022-05-04 NOTE — Telephone Encounter (Signed)
error 

## 2022-05-06 ENCOUNTER — Encounter (HOSPITAL_COMMUNITY): Payer: Self-pay | Admitting: Internal Medicine

## 2022-05-10 ENCOUNTER — Telehealth (HOSPITAL_COMMUNITY): Payer: Self-pay | Admitting: Licensed Clinical Social Worker

## 2022-05-10 ENCOUNTER — Encounter (HOSPITAL_COMMUNITY): Payer: Self-pay | Admitting: Internal Medicine

## 2022-05-10 NOTE — Telephone Encounter (Signed)
H&V Care Navigation CSW Progress Note  Clinical Social Worker contacted patient by phone to discuss transportation barriers.  Patient is participating in a Managed Medicaid Plan:  Yes  CSW informed pt had issues with transportation which preventing him from coming to last appt.  CSW called pt to discuss.  Pt states that Medicaid transport did not pick him up in time which led to him missing appt.  CSW explained that we needed to attempt to use payor source for transportation to get to 8/31 appt but that if they are delayed again that day he could call me to get assistance with taxi ride to appt so he doesn't miss.    SDOH Screenings   Alcohol Screen: Not on file  Depression (ZOX0-9): Not on file  Financial Resource Strain: High Risk (01/21/2022)   Overall Financial Resource Strain (CARDIA)    Difficulty of Paying Living Expenses: Hard  Food Insecurity: Food Insecurity Present (01/21/2022)   Hunger Vital Sign    Worried About Running Out of Food in the Last Year: Sometimes true    Ran Out of Food in the Last Year: Sometimes true  Housing: Low Risk  (01/21/2022)   Housing    Last Housing Risk Score: 0  Physical Activity: Not on file  Social Connections: Not on file  Stress: Not on file  Tobacco Use: Low Risk  (04/16/2022)   Patient History    Smoking Tobacco Use: Never    Smokeless Tobacco Use: Never    Passive Exposure: Not on file  Transportation Needs: Unmet Transportation Needs (05/10/2022)   PRAPARE - Administrator, Civil Service (Medical): Yes    Lack of Transportation (Non-Medical): Yes    Burna Sis, LCSW Clinical Social Worker [facility] Desk#: [phone] Cell#: [phone]

## 2022-05-11 ENCOUNTER — Encounter (HOSPITAL_COMMUNITY): Payer: Self-pay | Admitting: Internal Medicine

## 2022-05-11 ENCOUNTER — Other Ambulatory Visit (HOSPITAL_COMMUNITY): Payer: Self-pay

## 2022-05-11 ENCOUNTER — Other Ambulatory Visit (HOSPITAL_COMMUNITY): Payer: Self-pay | Admitting: *Deleted

## 2022-05-11 MED ORDER — DAPAGLIFLOZIN PROPANEDIOL 10 MG PO TABS
10.0000 mg | ORAL_TABLET | Freq: Every day | ORAL | 2 refills | Status: DC
  Filled 2022-05-11 – 2022-05-19 (×5): qty 30, 30d supply, fill #0
  Filled 2022-06-23: qty 30, 30d supply, fill #1
  Filled 2022-07-23: qty 30, 30d supply, fill #2

## 2022-05-11 MED ORDER — POTASSIUM CHLORIDE CRYS ER 20 MEQ PO TBCR
40.0000 meq | EXTENDED_RELEASE_TABLET | Freq: Every day | ORAL | 3 refills | Status: DC
  Filled 2022-05-11 – 2022-05-16 (×2): qty 60, 30d supply, fill #0

## 2022-05-11 MED ORDER — ATORVASTATIN CALCIUM 40 MG PO TABS
40.0000 mg | ORAL_TABLET | Freq: Every day | ORAL | 3 refills | Status: DC
  Filled 2022-05-11 – 2022-05-16 (×2): qty 30, 30d supply, fill #0
  Filled 2022-06-11: qty 30, 30d supply, fill #1
  Filled 2022-06-14 – 2022-07-13 (×2): qty 30, 30d supply, fill #2
  Filled 2022-08-12: qty 30, 30d supply, fill #3

## 2022-05-12 ENCOUNTER — Other Ambulatory Visit (HOSPITAL_COMMUNITY): Payer: Self-pay

## 2022-05-13 ENCOUNTER — Other Ambulatory Visit (HOSPITAL_COMMUNITY): Payer: Self-pay

## 2022-05-16 ENCOUNTER — Other Ambulatory Visit (HOSPITAL_COMMUNITY): Payer: Self-pay

## 2022-05-17 ENCOUNTER — Other Ambulatory Visit (HOSPITAL_COMMUNITY): Payer: Self-pay

## 2022-05-18 ENCOUNTER — Other Ambulatory Visit (HOSPITAL_COMMUNITY): Payer: Self-pay

## 2022-05-18 ENCOUNTER — Encounter (HOSPITAL_COMMUNITY): Payer: Self-pay | Admitting: Internal Medicine

## 2022-05-19 ENCOUNTER — Other Ambulatory Visit (HOSPITAL_COMMUNITY): Payer: Self-pay

## 2022-05-19 ENCOUNTER — Telehealth (HOSPITAL_COMMUNITY): Payer: Self-pay | Admitting: Pharmacy Technician

## 2022-05-19 NOTE — Telephone Encounter (Signed)
[facility] Patient Advocate Encounter  Prior Authorization for [name] has been approved.    PA# [id number] Effective dates: 05/19/22 through 05/19/23  Archer Asa, CPhT

## 2022-05-19 NOTE — Telephone Encounter (Signed)
Patient Advocate Encounter   Received notification from [facility] that prior authorization for Zachary Arias is required.   PA submitted on CoverMyMeds Key [id number] Status is pending   Will continue to follow.

## 2022-05-20 ENCOUNTER — Other Ambulatory Visit (HOSPITAL_COMMUNITY): Payer: Self-pay

## 2022-05-21 ENCOUNTER — Inpatient Hospital Stay: Payer: Medicaid Other | Admitting: Nurse Practitioner

## 2022-05-27 ENCOUNTER — Encounter (HOSPITAL_COMMUNITY): Payer: Self-pay

## 2022-05-27 ENCOUNTER — Ambulatory Visit (HOSPITAL_COMMUNITY)
Admission: RE | Admit: 2022-05-27 | Discharge: 2022-05-27 | Disposition: A | Discharge status: Home / Self Care | Payer: Medicaid Other | Source: Ambulatory Visit | Attending: Cardiology | Admitting: Cardiology

## 2022-05-27 ENCOUNTER — Other Ambulatory Visit (HOSPITAL_COMMUNITY): Payer: Self-pay

## 2022-05-27 ENCOUNTER — Telehealth (HOSPITAL_COMMUNITY): Payer: Self-pay | Admitting: Licensed Clinical Social Worker

## 2022-05-27 VITALS — BP 108/70 | HR 97 | Wt 133.0 lb

## 2022-05-27 DIAGNOSIS — N1831 Chronic kidney disease, stage 3a: Secondary | ICD-10-CM | POA: Insufficient documentation

## 2022-05-27 DIAGNOSIS — I130 Hypertensive heart and chronic kidney disease with heart failure and stage 1 through stage 4 chronic kidney disease, or unspecified chronic kidney disease: Secondary | ICD-10-CM | POA: Insufficient documentation

## 2022-05-27 DIAGNOSIS — I493 Ventricular premature depolarization: Secondary | ICD-10-CM | POA: Insufficient documentation

## 2022-05-27 DIAGNOSIS — Z7984 Long term (current) use of oral hypoglycemic drugs: Secondary | ICD-10-CM | POA: Insufficient documentation

## 2022-05-27 DIAGNOSIS — Z79899 Other long term (current) drug therapy: Secondary | ICD-10-CM | POA: Insufficient documentation

## 2022-05-27 DIAGNOSIS — I340 Nonrheumatic mitral (valve) insufficiency: Secondary | ICD-10-CM | POA: Diagnosis not present

## 2022-05-27 DIAGNOSIS — I2510 Atherosclerotic heart disease of native coronary artery without angina pectoris: Secondary | ICD-10-CM | POA: Insufficient documentation

## 2022-05-27 DIAGNOSIS — I5022 Chronic systolic (congestive) heart failure: Secondary | ICD-10-CM | POA: Diagnosis present

## 2022-05-27 DIAGNOSIS — I272 Pulmonary hypertension, unspecified: Secondary | ICD-10-CM | POA: Insufficient documentation

## 2022-05-27 LAB — DIGOXIN LEVEL: Digoxin Level: 1.7 ng/mL (ref 0.8–2.0)

## 2022-05-27 LAB — COMPREHENSIVE METABOLIC PANEL
ALT: 60 U/L — ABNORMAL HIGH (ref 0–44)
AST: 43 U/L — ABNORMAL HIGH (ref 15–41)
Albumin: 4.2 g/dL (ref 3.5–5.0)
Alkaline Phosphatase: 60 U/L (ref 38–126)
Anion gap: 9 (ref 5–15)
BUN: 11 mg/dL (ref 6–20)
CO2: 28 mmol/L (ref 22–32)
Calcium: 9.6 mg/dL (ref 8.9–10.3)
Chloride: 104 mmol/L (ref 98–111)
Creatinine, Ser: 1.45 mg/dL — ABNORMAL HIGH (ref 0.61–1.24)
GFR, Estimated: 56 mL/min — ABNORMAL LOW (ref >60)
Glucose, Bld: 89 mg/dL (ref 70–99)
Potassium: 4.9 mmol/L (ref 3.5–5.1)
Sodium: 141 mmol/L (ref 135–145)
Total Bilirubin: 1 mg/dL (ref 0.3–1.2)
Total Protein: 6.8 g/dL (ref 6.5–8.1)

## 2022-05-27 LAB — BRAIN NATRIURETIC PEPTIDE: B Natriuretic Peptide: 416 pg/mL — ABNORMAL HIGH (ref 0.0–100.0)

## 2022-05-27 MED ORDER — LOSARTAN POTASSIUM 25 MG PO TABS
12.5000 mg | ORAL_TABLET | Freq: Every evening | ORAL | 3 refills | Status: DC
  Filled 2022-05-27: qty 15, 30d supply, fill #0

## 2022-05-27 NOTE — Patient Instructions (Signed)
Medication Changes:  Start: Take Losartan 12.5 tablet by mouth at bedtime  Lab Work:  Labs today We will only contact you if something comes back abnormal or we need to make some changes. Otherwise no news is good news!     Follow-Up in: Keep scheduled appointment  At the [facility], you and your health needs are our priority. We have a designated team specialized in the treatment of Heart Failure. This Care Team includes your primary Heart Failure Specialized Cardiologist (physician), Advanced Practice Providers (APPs- Physician Assistants and Nurse Practitioners), and Pharmacist who all work together to provide you with the care you need, when you need it.   You may see any of the following providers on your designated Care Team at your next follow up:  Dr Arvilla Meres Dr Carron Curie, NP Robbie Lis, [place] [facility] [place], [place] [name], PharmD   Please be sure to bring in all your medications bottles to every appointment.   Need to Contact us:  If you have any questions or concerns before your next appointment please send [place] a message through [place] or call our office at [phone].    TO LEAVE A MESSAGE FOR THE NURSE SELECT OPTION 2, PLEASE LEAVE A MESSAGE INCLUDING: YOUR NAME DATE OF BIRTH CALL BACK NUMBER REASON FOR CALL**this is important as we prioritize the call backs  YOU WILL RECEIVE A CALL BACK THE SAME DAY AS LONG AS YOU CALL BEFORE 4:00 PM

## 2022-05-27 NOTE — Progress Notes (Signed)
ReDS Vest / Clip - 05/27/22 1400       ReDS Vest / Clip   Station Marker C    Ruler Value 24    ReDS Value Range Low volume    ReDS Actual Value 33

## 2022-05-27 NOTE — Telephone Encounter (Signed)
H&V Care Navigation CSW Progress Note  Clinical Social Worker  received call from pt  to discuss transportation barriers.  Patient is participating in a Managed Medicaid Plan:  Yes  Pt called CSW to request help with ride to todays appt.  Pt reports his brother was supposed to be taking him to appt today but he just found out he could not do so- too close to appt to set up through [facility].  CSW able to set up ride through [employer] to get pt to appt.  SDOH Screenings   Alcohol Screen: Not on file  Depression (AES9-7): Not on file  Financial Resource Strain: High Risk (01/21/2022)   Overall Financial Resource Strain (CARDIA)    Difficulty of Paying Living Expenses: Hard  Food Insecurity: Food Insecurity Present (01/21/2022)   Hunger Vital Sign    Worried About Running Out of Food in the Last Year: Sometimes true    Ran Out of Food in the Last Year: Sometimes true  Housing: Low Risk  (01/21/2022)   Housing    Last Housing Risk Score: 0  Physical Activity: Not on file  Social Connections: Not on file  Stress: Not on file  Tobacco Use: Low Risk  (04/16/2022)   Patient History    Smoking Tobacco Use: Never    Smokeless Tobacco Use: Never    Passive Exposure: Not on file  Transportation Needs: Unmet Transportation Needs (05/27/2022)   PRAPARE - Transportation    Lack of Transportation (Medical): Yes    Lack of Transportation (Non-Medical): Yes   Burna Sis, LCSW Clinical Social Worker [facility] Desk#: [phone] Cell#: [phone]

## 2022-05-27 NOTE — Progress Notes (Signed)
ADVANCED HF CLINIC NOTE   Patient ID: Zachary Arias, male   DOB: 09-Mar-1961, 61 y.o.   MRN: [id number] PCP: [name] HF Cardiologist: [name]   HPI: Zachary Arias is a 61 y.o. 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 Cath with 1-v CAD with chronically occluded RCA. MRI in January 2011 with EF 31% with inferior scar.   Brother had heart transplant in [place] several months ago. Dad also had previous heart transplant.   He was lost to f/u since 2015. Admitted 03/05/20 with cardiogenic shock after stopping meds for months and using supplements. SBP in 70-80s. ECHO EF 20-25% RV normal. Severe MR. Having frequent PVCs. Placed on amiodarone to suppress PVCs  [facility] 03/10/20 showed stable CAD (chronically occluded distal RCA and moderate non-obstructive CAD elsewhere), low filling pressures and normal cardiac output on milrinone 0.25. Was able to wean off milrinone. Started back on GDMT.  Repeat echo 01/05/21 EF 40-45% mild to mod MR RV mild HK   Lost to f/u.  Follow up 3/23, off all meds except ASA x 3 months, REDs 34%, Farxiga and losartan restarted, with close follow to add back GDMT. Zio 2 week placed to quantify PVCs, showed frequent NSVT, frequent PVCs (13.1%). Started on amio 200 mg bid and referred to EP.   Admitted 4/23 with a/c CHF, had been off all meds x 1 month. Started on IV lasix. BP dropped and developed AKI, AHF consulted. Empiric DBA started. Echo showed EF 30-35%. Continued IV diuresis, down 20 lbs overall. Failed DBA wean. GDMT titrated. Arranged for home DBA via PICC with Hospice (only option as he is not a citizen and uninsured). He had also refused additional advanced thearpies, including VAD, and there were also concerns regarding lack of social supported.   Readmitted 7/23 w/ a/c CHF with progressive SOB. Expressed desire to reconsider advanced therapies, given he had obtained insurance. However, after careful  consideration and evaluation by both social work and VAD team, he was not felt to be candidate for LVAD nor transplant given lack of social support and prior h/o poor compliance.   He was diuresed w/ IV Lasix. Home DBA continued. Echo showed EF 25-30%, w/ severe MR/TR. RV systolic function moderately and HK. RHC/LHC on DBA 5 showed  stable 1V CAD, moderate mixed pulmonary HTN, moderately reduced CO on DBA 5, prominent v waves in PCWP tracing suggestive of significant MR. TEE demonstrated  LVEF 25% severe central MR. RV moderately down. Structural heart team was consulted and recommended MitraClip. Prior to procedure, he had IABP + swan placed for HF optimization. PA pressures elevated on DBA, was switched to Milrinone.0.375 mcg. Underwent successful mTEER. S/p TEER with MitraClip G4 XTW devices, 1st to the lateral side of A2/P2 and 2nd to the medial side of A2/P2 w/ excellent result w/ reduction from 4+ -> 2+ MR. Weaned off IABP and off milrinone w/ stable hemodynamics. Placed on GDMT. D/c Wt 135 lb.   He missed his initial post hospital f/u and had to reschedule. Presents today for post hospital f/u.    Reports doing fairly well from HF symptom standpoint. Functional status better, NYHA Class II. Denies resting dyspnea. No orthopnea/PND. Wt down from hospital d/c wt. Reports full med compliance. BP 108/70. He denies orthostatic symptoms. EKG shows NSR w/ PVC.   Main complaints are noncardiac. Complains of sore throat and hoarseness, left shoulder pain w/ decreased ROB. Also reports groin stitches are still  in place from IABP. No groin pain.     Cardiac Studies: - Echo (4/11): EF 30-35%. - Echo (8/13): EF 45-50% with inferior and inferoseptal hypokinesis - Echo (10/21): EF 30-35% - Echo (4/22): EF 40-45% mild to mod MR RV mild HK  - Zio 2 week (3/23): 13.1% PVCs - Echo (5/23):EF 30-35% - Echo (7/23): EF 25-30%, severe MR/TR, RV moderately reduced and HK - R/LHC (7/23): stable 1V CAD, moderate  mixed pulmonary HTN, moderately reduced CO on DBA 5, prominent v waves in PCWP tracing suggestive of significant MR - TEE  (7/23): EF 25%, severe central MR, RV moderately reduced - Echo (7/23 post MitraClip) EF 25%, mild-mod residual MR   SH: Lives in [place], nonsmoker. He has been disabled since May. He needs assistance with transportation. Lives alone. He uses [employer].   FH: No premature CAD  ROS: All systems negative except as listed in HPI, PMH and Problem List.  Past Medical History:  Diagnosis Date   Congestive heart failure (CHF) (HCC)    secondary to ischemic cardiomyopathy with an ejection fraction of 25-30%   Coronary artery disease    History of hypertension    Inguinal hernia 09/28/2003   History of left inguinal hernia, status post repair in 2005   S/P mitral valve clip implantation 04/15/2022   Mitraclip XTW x2 with Dr. Excell Seltzer and Dr. Lynnette Caffey   Tachycardia 09/27/2008    Unexplained tachycardia during hospitalization   Current Outpatient Medications  Medication Sig Dispense Refill   amiodarone (PACERONE) 200 MG tablet Take 1 tablet (200 mg total) by mouth daily. 30 tablet 0   aspirin EC (ASPIRIN 81) 81 MG tablet Take 1 tablet (81 mg total) by mouth daily. 30 tablet 6   atorvastatin (LIPITOR) 40 MG tablet Take 1 tablet (40 mg total) by mouth daily. 30 tablet 3   dapagliflozin propanediol (FARXIGA) 10 MG TABS tablet Take 1 tablet (10 mg total) by mouth daily for the heart. 30 tablet 2   digoxin (LANOXIN) 0.125 MG tablet Take 1 tablet by mouth daily. 30 tablet 6   LORazepam (ATIVAN) 0.5 MG tablet Take 0.5 mg by mouth every 4 (four) hours as needed for anxiety.     Multiple Vitamin (MULTIVITAMIN) capsule Take 1 capsule by mouth daily.     Potassium Chloride (KLOR-CON PO) Take 40 mEq by mouth daily.     spironolactone (ALDACTONE) 25 MG tablet Take 1 tablet by mouth daily. 30 tablet 6   torsemide (DEMADEX) 20 MG tablet Take 2 tablets (40 mg total) by  mouth daily. 60 tablet 0   No current facility-administered medications for this encounter.   BP 94/66   Pulse 97   Wt 60.3 kg (133 lb)   SpO2 (!) 73%   BMI 20.22 kg/m   Wt Readings from Last 3 Encounters:  05/27/22 60.3 kg (133 lb)  04/20/22 61.8 kg (136 lb 3.9 oz)  03/09/22 65.1 kg (143 lb 9.6 oz)   PHYSICAL EXAM: General:  thin male, No respiratory difficulty HEENT: normal Neck: supple. no JVD. Carotids 2+ bilat; no bruits. No lymphadenopathy or thyromegaly appreciated. Cor: PMI nondisplaced. Regular rate & rhythm. 2/6 MR murmur  Lungs: clear Abdomen: soft, nontender, nondistended. No hepatosplenomegaly. No bruits or masses. Good bowel sounds. Extremities: no cyanosis, clubbing, rash, edema Neuro: alert & oriented x 3, cranial nerves grossly intact. moves all 4 extremities w/o difficulty. Affect pleasant.   ASSESSMENT & PLAN:  1.  Chronic Systolic Heart Failure - Cath  1-v CAD chronically occluded RCA. MRI in January 2011 EF 31% with inferior scar.  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   - Echo (8/13): EF 45-50% .  - No f/u between to 2015-2021 - Echo (6/21): EF 20-25% severe central MR. Repeat cath 6/21 with stable CAD - Echo (4/22): EF 40-45% mild to mod MR RV mild HK  - Echo (4/23): EF 30-35%.   - Admit 4/23 with shock, started on DBA and lasix. Diuresed 20 lbs. Unable to wean DBA discharged on home DBA 5 mcg/kg/min with hospice care - Readmitted 7/23 with a/c HF. Echo 04/10/22 EF 25-30% Severe MR/TR. Moderate RV HK. RHC/LHC with stable 1V disease, moderate mixed pulmonary HTN, moderately reduced CO on DBA 5, and prominent v waves in PCWP tracing suggestive of significant MR. Able to obtain Medicaid coverage. IABP + swan placed. PA pressures elevated on DBA. Switched to Milrinone.0.375 mcg. Felt not to be candidate for VAD/transplant due to compliance issues and lack of caregiver support -> underwent mTEER on 7/20 with excellent result.  Weaned off IABP and Milrinone support w/ stable hemodynamics.  - NYHA Class II, Volume stable on exam.  - Continue Farxiga 10 mg daily  - Continue Spironolactone 25 mg daily  - Add low dose losartan 12.5 mg qhs. BP too soft for Entresto  - Digoxin 0.125 mg daily. Check Dig level  - Continue Lasix 40 mg bid  - No ? blocker yet w/ recent low output - Check CMP + BNP    2. Mitral Regurgitation  -S/P mTEER 04/15/22 w/ reduction from  MR 4+ -> 2+  -Scheduled for repeat echo 9/13  -Appreciate structural Heart's Assistance   3. 4. CAD  - Cath 7/23 w/ stable with 1-v CAD with chronically occluded RCA - denies CP  - Continue ASA and atorvastatin.   4. PVCs - EKG today shows NSR w/ 1 PVC  -Continue amio 200 mg daily  - Keep consultation w/ EP on 9/13    5. CKD Stage IIIa  - Creatinine baseline 1.4 - on Farxiga  - check BMP today   F/u w/ APP in 3-4 wks for further med titration    Lyda Jester, PA-C  2:19 PM

## 2022-05-28 ENCOUNTER — Other Ambulatory Visit (HOSPITAL_COMMUNITY): Payer: Self-pay

## 2022-05-28 ENCOUNTER — Telehealth (HOSPITAL_COMMUNITY): Payer: Self-pay | Admitting: *Deleted

## 2022-05-28 DIAGNOSIS — N183 Chronic kidney disease, stage 3 unspecified: Secondary | ICD-10-CM

## 2022-05-28 DIAGNOSIS — I5022 Chronic systolic (congestive) heart failure: Secondary | ICD-10-CM

## 2022-05-28 MED ORDER — TORSEMIDE 20 MG PO TABS
20.0000 mg | ORAL_TABLET | Freq: Every day | ORAL | 3 refills | Status: DC
  Filled 2022-05-28 – 2022-06-11 (×2): qty 90, 90d supply, fill #0
  Filled 2022-09-06: qty 90, 90d supply, fill #1

## 2022-05-28 NOTE — Telephone Encounter (Signed)
Called patient per Boyce Medici, PA with following instructions: 1. Stop Digoxin 2. Reduce Torsemide to 20 mg daily 3. Stop potassium.  4. Repeat lab in one week - pt has lab appointment scheduled.

## 2022-06-03 ENCOUNTER — Other Ambulatory Visit (HOSPITAL_COMMUNITY): Payer: Self-pay

## 2022-06-03 ENCOUNTER — Telehealth (HOSPITAL_COMMUNITY): Payer: Self-pay | Admitting: Licensed Clinical Social Worker

## 2022-06-03 ENCOUNTER — Ambulatory Visit (HOSPITAL_COMMUNITY)
Admission: RE | Admit: 2022-06-03 | Discharge: 2022-06-03 | Disposition: A | Discharge status: Home / Self Care | Payer: Medicaid Other | Source: Ambulatory Visit | Attending: Internal Medicine | Admitting: Internal Medicine

## 2022-06-03 DIAGNOSIS — I5022 Chronic systolic (congestive) heart failure: Secondary | ICD-10-CM | POA: Diagnosis present

## 2022-06-03 LAB — BASIC METABOLIC PANEL
Anion gap: 6 (ref 5–15)
BUN: 19 mg/dL (ref 6–20)
CO2: 25 mmol/L (ref 22–32)
Calcium: 8.7 mg/dL — ABNORMAL LOW (ref 8.9–10.3)
Chloride: 107 mmol/L (ref 98–111)
Creatinine, Ser: 1.62 mg/dL — ABNORMAL HIGH (ref 0.61–1.24)
GFR, Estimated: 49 mL/min — ABNORMAL LOW (ref >60)
Glucose, Bld: 93 mg/dL (ref 70–99)
Potassium: 4.4 mmol/L (ref 3.5–5.1)
Sodium: 138 mmol/L (ref 135–145)

## 2022-06-03 NOTE — Telephone Encounter (Signed)
H&V Care Navigation CSW Progress Note  Clinical Social Worker contacted by patient who states Medicaid just informed him they couldn't find a ride for him to get lab work.  CSW able to get ride through [employer].  Will text for return ride home.  Patient is participating in a Managed Medicaid Plan:  Yes  SDOH Screenings   Food Insecurity: Food Insecurity Present (01/21/2022)  Housing: Low Risk  (01/21/2022)  Transportation Needs: Unmet Transportation Needs (06/03/2022)  Financial Resource Strain: High Risk (01/21/2022)  Tobacco Use: Low Risk  (05/27/2022)   Burna Sis, LCSW Clinical Social Worker [facility] Desk#: [phone] Cell#: [phone]

## 2022-06-08 ENCOUNTER — Other Ambulatory Visit (HOSPITAL_COMMUNITY): Payer: Self-pay

## 2022-06-09 ENCOUNTER — Ambulatory Visit (HOSPITAL_COMMUNITY)
Admission: RE | Admit: 2022-06-09 | Discharge: 2022-06-09 | Disposition: A | Discharge status: Home / Self Care | Payer: Medicaid Other | Source: Ambulatory Visit | Attending: Family Medicine | Admitting: Family Medicine

## 2022-06-09 ENCOUNTER — Ambulatory Visit (HOSPITAL_BASED_OUTPATIENT_CLINIC_OR_DEPARTMENT_OTHER)
Admission: RE | Admit: 2022-06-09 | Discharge: 2022-06-09 | Disposition: A | Discharge status: Home / Self Care | Payer: Medicaid Other | Source: Ambulatory Visit | Attending: Cardiology | Admitting: Cardiology

## 2022-06-09 ENCOUNTER — Other Ambulatory Visit (HOSPITAL_COMMUNITY): Payer: Self-pay

## 2022-06-09 ENCOUNTER — Encounter (HOSPITAL_COMMUNITY): Payer: Self-pay

## 2022-06-09 VITALS — BP 126/88 | HR 76 | Wt 144.0 lb

## 2022-06-09 DIAGNOSIS — I2510 Atherosclerotic heart disease of native coronary artery without angina pectoris: Secondary | ICD-10-CM | POA: Insufficient documentation

## 2022-06-09 DIAGNOSIS — Z7901 Long term (current) use of anticoagulants: Secondary | ICD-10-CM | POA: Insufficient documentation

## 2022-06-09 DIAGNOSIS — I255 Ischemic cardiomyopathy: Secondary | ICD-10-CM | POA: Diagnosis not present

## 2022-06-09 DIAGNOSIS — Z79899 Other long term (current) drug therapy: Secondary | ICD-10-CM | POA: Insufficient documentation

## 2022-06-09 DIAGNOSIS — Z95818 Presence of other cardiac implants and grafts: Secondary | ICD-10-CM

## 2022-06-09 DIAGNOSIS — Z7982 Long term (current) use of aspirin: Secondary | ICD-10-CM | POA: Diagnosis not present

## 2022-06-09 DIAGNOSIS — I340 Nonrheumatic mitral (valve) insufficiency: Secondary | ICD-10-CM

## 2022-06-09 DIAGNOSIS — N1831 Chronic kidney disease, stage 3a: Secondary | ICD-10-CM | POA: Diagnosis not present

## 2022-06-09 DIAGNOSIS — Z7984 Long term (current) use of oral hypoglycemic drugs: Secondary | ICD-10-CM | POA: Insufficient documentation

## 2022-06-09 DIAGNOSIS — Z9889 Other specified postprocedural states: Secondary | ICD-10-CM | POA: Diagnosis not present

## 2022-06-09 DIAGNOSIS — I130 Hypertensive heart and chronic kidney disease with heart failure and stage 1 through stage 4 chronic kidney disease, or unspecified chronic kidney disease: Secondary | ICD-10-CM | POA: Insufficient documentation

## 2022-06-09 DIAGNOSIS — L905 Scar conditions and fibrosis of skin: Secondary | ICD-10-CM | POA: Insufficient documentation

## 2022-06-09 DIAGNOSIS — I272 Pulmonary hypertension, unspecified: Secondary | ICD-10-CM | POA: Diagnosis not present

## 2022-06-09 DIAGNOSIS — E785 Hyperlipidemia, unspecified: Secondary | ICD-10-CM | POA: Diagnosis not present

## 2022-06-09 DIAGNOSIS — I5022 Chronic systolic (congestive) heart failure: Secondary | ICD-10-CM | POA: Diagnosis present

## 2022-06-09 DIAGNOSIS — I493 Ventricular premature depolarization: Secondary | ICD-10-CM

## 2022-06-09 LAB — BASIC METABOLIC PANEL
Anion gap: 15 (ref 5–15)
BUN: 24 mg/dL — ABNORMAL HIGH (ref 6–20)
CO2: 24 mmol/L (ref 22–32)
Calcium: 9.5 mg/dL (ref 8.9–10.3)
Chloride: 102 mmol/L (ref 98–111)
Creatinine, Ser: 1.44 mg/dL — ABNORMAL HIGH (ref 0.61–1.24)
GFR, Estimated: 56 mL/min — ABNORMAL LOW (ref >60)
Glucose, Bld: 95 mg/dL (ref 70–99)
Potassium: 4 mmol/L (ref 3.5–5.1)
Sodium: 141 mmol/L (ref 135–145)

## 2022-06-09 LAB — ECHOCARDIOGRAM COMPLETE
Area-P 1/2: 1.96 cm2
MV VTI: 0.98 cm2
S' Lateral: 5.8 cm

## 2022-06-09 LAB — BRAIN NATRIURETIC PEPTIDE: B Natriuretic Peptide: 372.4 pg/mL — ABNORMAL HIGH (ref 0.0–100.0)

## 2022-06-09 MED ORDER — LOSARTAN POTASSIUM 25 MG PO TABS
25.0000 mg | ORAL_TABLET | Freq: Every evening | ORAL | 3 refills | Status: DC
  Filled 2022-06-09 – 2022-06-11 (×2): qty 90, 90d supply, fill #0
  Filled 2022-09-06: qty 90, 90d supply, fill #1

## 2022-06-09 MED ORDER — LOSARTAN POTASSIUM 25 MG PO TABS
25.0000 mg | ORAL_TABLET | Freq: Every evening | ORAL | 3 refills | Status: DC
  Filled 2022-06-09: qty 90, 90d supply, fill #0

## 2022-06-09 NOTE — Addendum Note (Signed)
Encounter addended by: Jacklynn Ganong, FNP on: 06/09/2022 3:25 PM  Actions taken: Clinical Note Signed

## 2022-06-09 NOTE — Patient Instructions (Addendum)
Labs done today. We will contact you only if your labs are abnormal.  INCREASE Losartan to 25mg  (1 tablet) by mouth daily.  No other medication changes were made. Please continue all current medications as prescribed.  Your physician recommends that you schedule a follow-up appointment in: 3 weeks with our Clinic Pharmacist   If you have any questions or concerns before your next appointment please send a message through [employer] or call our office at [phone].    TO LEAVE A MESSAGE FOR THE NURSE SELECT OPTION 2, PLEASE LEAVE A MESSAGE INCLUDING: YOUR NAME DATE OF BIRTH CALL BACK NUMBER REASON FOR CALL**this is important as we prioritize the call backs  YOU WILL RECEIVE A CALL BACK THE SAME DAY AS LONG AS YOU CALL BEFORE 4:00 PM   Do the following things EVERYDAY: Weigh yourself in the morning before breakfast. Write it down and keep it in a log. Take your medicines as prescribed Eat low salt foods--Limit salt (sodium) to 2000 mg per day.  Stay as active as you can everyday Limit all fluids for the day to less than 2 liters   At the [facility], you and your health needs are our priority. As part of our continuing mission to provide you with exceptional heart care, we have created designated Provider Care Teams. These Care Teams include your primary Cardiologist (physician) and Advanced Practice Providers (APPs- Physician Assistants and Nurse Practitioners) who all work together to provide you with the care you need, when you need it.   You may see any of the following providers on your designated Care Team at your next follow up: Dr [phone] Dr Arvilla Meres, NP Carron Curie, Robbie Lis [place], PharmD   Please be sure to bring in all your medications bottles to every appointment.

## 2022-06-09 NOTE — Progress Notes (Addendum)
[facility] Note   Patient ID: Zachary Arias, male   DOB: [birth year], 61 y.o.   MRN: [id number] PCP: Dr. Ruben Gottron HF Cardiologist: Dr. Haroldine Laws   HPI: Zachary Arias is a 61 y.o. 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 Cath with 1-v CAD with chronically occluded RCA. MRI in January 2011 with EF 31% with inferior scar.   Brother had heart transplant in [place] several months ago. Dad also had previous heart transplant.   He was lost to f/u since 2015. Admitted 03/05/20 with cardiogenic shock after stopping meds for months and using supplements. SBP in 70-80s. ECHO EF 20-25% RV normal. Severe MR. Having frequent PVCs. Placed on amiodarone to suppress PVCs  [facility] 03/10/20 showed stable CAD (chronically occluded distal RCA and moderate non-obstructive CAD elsewhere), low filling pressures and normal cardiac output on milrinone 0.25. Was able to wean off milrinone. Started back on GDMT.  Repeat echo 01/05/21 EF 40-45% mild to mod MR RV mild HK   Lost to f/u.  Follow up 3/23, off all meds except ASA x 3 months, REDs 34%, Farxiga and losartan restarted, with close follow to add back GDMT. Zio 2 week placed to quantify PVCs, showed frequent NSVT, frequent PVCs (13.1%). Started on amio 200 mg bid and referred to EP.   Admitted 4/23 with a/c CHF, had been off all meds x 1 month. Started on IV lasix. BP dropped and developed AKI, AHF consulted. Empiric DBA started. Echo showed EF 30-35%. Continued IV diuresis, down 20 lbs overall. Failed DBA wean. GDMT titrated. Arranged for home DBA via PICC with Hospice (only option as he is not a citizen and uninsured). He had also refused additional advanced thearpies, including VAD, and there were also concerns regarding lack of social supported.   Readmitted 7/23 w/ a/c CHF with progressive SOB. Expressed desire to reconsider advanced therapies, given he had obtained insurance. However, after  careful consideration and evaluation by both social work and VAD team, he was not felt to be candidate for LVAD nor transplant given lack of social support and prior h/o poor compliance. Diuresed with Lasix and home DBA continued. Echo showed EF 25-30%, w/ severe MR/TR. RV systolic function moderately and HK. RHC/LHC on DBA 5 showed  stable 1V CAD, moderate mixed pulmonary HTN, moderately reduced CO, prominent v waves in PCWP tracing suggestive of significant MR. TEE showed  LVEF 25% severe central MR. RV moderately down. Structural heart team consulted and recommended MitraClip. Optimized pre-procedure with IABP. PA pressures elevated on DBA, was switched to Milrinone.0.375 mcg.  S/p TEER with MitraClip G4 XTW devices, 1st to the lateral side of A2/P2 and 2nd to the medial side of A2/P2 w/ excellent result w/ reduction from 4+ -> 2+ MR. Weaned off IABP and off milrinone w/ stable hemodynamics. GDMT titrated, discharged home, weight 135 lbs.  Follow up 8/23, improved NYHA. Low dose losartan added. Dig stopped with elevated level.  Today he returns for HF follow up. Overall feeling fine. He is not SOB walking up steps to his apartment. Denies abnormal bleeding, palpitations, CP, dizziness, edema, or PND/Orthopnea. Appetite ok. No fever or chills. Weight at home 138-140 pounds. Taking all medications.    Cardiac Studies: - Echo (4/11): EF 30-35%. - Echo (8/13): EF 45-50% with inferior and inferoseptal hypokinesis - Echo (10/21): EF 30-35% - Echo (4/22): EF 40-45% mild to mod MR RV mild HK  - Zio 2 week (3/23): 13.1%  PVCs - Echo (5/23):EF 30-35% - Echo (7/23): EF 25-30%, severe MR/TR, RV moderately reduced and HK - R/LHC (7/23): stable 1V CAD, moderate mixed pulmonary HTN, moderately reduced CO on DBA 5, prominent v waves in PCWP tracing suggestive of significant MR - TEE  (7/23): EF 25%, severe central MR, RV moderately reduced - Echo (7/23 post MitraClip) EF 25%, mild-mod residual MR   SH: Lives  in [place], nonsmoker. He has been disabled since May. He needs assistance with transportation. Lives alone. He uses [employer] grocer[employer].   FH: No premature CAD  ROS: All systems negative except as listed in HPI, PMH and Problem List.  Past Medical History:  Diagnosis Date   Congestive heart failure (CHF) (HCC)    secondary to ischemic cardiomyopathy with an ejection fraction of 25-30%   Coronary artery disease    History of hypertension    Inguinal hernia 09/28/2003   History of left inguinal hernia, status post repair in 2005   S/P mitral valve clip implantation 04/15/2022   Mitraclip XTW x2 with Dr. Excell Seltzer and Dr. Lynnette Caffey   Tachycardia 09/27/2008    Unexplained tachycardia during hospitalization   Current Outpatient Medications  Medication Sig Dispense Refill   amiodarone (PACERONE) 200 MG tablet Take 1 tablet (200 mg total) by mouth daily. 30 tablet 0   aspirin EC (ASPIRIN 81) 81 MG tablet Take 1 tablet (81 mg total) by mouth daily. 30 tablet 6   atorvastatin (LIPITOR) 40 MG tablet Take 1 tablet (40 mg total) by mouth daily. 30 tablet 3   dapagliflozin propanediol (FARXIGA) 10 MG TABS tablet Take 1 tablet (10 mg total) by mouth daily for the heart. 30 tablet 2   LORazepam (ATIVAN) 0.5 MG tablet Take 0.5 mg by mouth every 4 (four) hours as needed for anxiety.     losartan (COZAAR) 25 MG tablet Take 1/2 tablet by mouth at bedtime. 15 tablet 3   Multiple Vitamin (MULTIVITAMIN) capsule Take 1 capsule by mouth daily.     spironolactone (ALDACTONE) 25 MG tablet Take 1 tablet by mouth daily. 30 tablet 6   torsemide (DEMADEX) 20 MG tablet Take 1 tablet (20 mg total) by mouth daily. 90 tablet 3   No current facility-administered medications for this encounter.   BP 126/88   Pulse 76   Wt 65.3 kg (144 lb)   SpO2 100%   BMI 21.90 kg/m   Wt Readings from Last 3 Encounters:  06/09/22 65.3 kg (144 lb)  05/27/22 60.3 kg (133 lb)  04/20/22 61.8 kg (136 lb 3.9 oz)    PHYSICAL EXAM: General:  NAD. No resp difficulty HEENT: Normal Neck: Supple. No JVD. Carotids 2+ bilat; no bruits. No lymphadenopathy or thryomegaly appreciated. Cor: PMI nondisplaced. Regular rate & rhythm. No rubs, gallops, 2/6 MR Lungs: Clear Abdomen: Soft, nontender, nondistended. No hepatosplenomegaly. No bruits or masses. Good bowel sounds. Extremities: No cyanosis, clubbing, rash, edema Neuro: Alert & oriented x 3, cranial nerves grossly intact. Moves all 4 extremities w/o difficulty. Affect pleasant.  ASSESSMENT & PLAN: 1.  Chronic Systolic Heart Failure - Cath 1-v CAD chronically occluded RCA. MRI in January 2011 EF 31% with inferior scar.  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   - Echo (8/13): EF 45-50% .  - No f/u between to 2015-2021 - Echo (6/21): EF 20-25% severe central MR. Repeat cath 6/21 with stable CAD - Echo (4/22): EF 40-45% mild to mod MR RV mild HK  - Echo (4/23): EF 30-35%.   -  Admit 4/23 with shock, started on DBA and lasix. Diuresed 20 lbs. Unable to wean DBA discharged on home DBA 5 mcg/kg/min with hospice care - Echo (04/10/22): EF 25-30% Severe MR/TR. Moderate RV HK.  - RHC/LHC with stable 1V disease, moderate mixed pulmonary HTN, moderately reduced CO on DBA 5, and prominent v waves in PCWP tracing suggestive of significant MR.  - s/p mTEER 7/23 - NYHA Class I-II, Volume stable on exam.  - Increase losartan to 25 mg qhs. - Continue Farxiga 10 mg daily  - Continue spironolactone 25 mg daily  - Continue torsemide 20 mg daily. - Off digoxin with elevated level. - No ? blocker yet w/ recent low output, plan to add back soon. - Labs today.  2. Mitral Regurgitation  - S/P mTEER 04/15/22 w/ reduction from  MR 4+ -> 2+  - Needs follow up with Structural heart team. - Repeat echo today, results pending.  3. CAD  - Cath 7/23 w/ stable with 1-v CAD with chronically occluded RCA - Denies CP.  - Continue ASA and  atorvastatin.   4. PVCs - asymptomatic. - Continue amio 200 mg daily.  - Keep consultation w/ EP on 9/18    5. CKD Stage IIIa  - Baseline SCr 1.4 - On Farxiga.  - Labs today.  Follow up with PharmD in 3 weeks (add beta blocker) and Dr. Gala Romney in 3 months.  Jacklynn Ganong, FNP  2:55 PM

## 2022-06-10 ENCOUNTER — Other Ambulatory Visit (HOSPITAL_COMMUNITY): Payer: Self-pay

## 2022-06-10 ENCOUNTER — Encounter (HOSPITAL_COMMUNITY): Payer: Self-pay | Admitting: Internal Medicine

## 2022-06-10 MED ORDER — AMIODARONE HCL 200 MG PO TABS
200.0000 mg | ORAL_TABLET | Freq: Every day | ORAL | 11 refills | Status: DC
  Filled 2022-06-10: qty 30, 30d supply, fill #0
  Filled 2022-07-03: qty 30, 30d supply, fill #1
  Filled 2022-08-05: qty 30, 30d supply, fill #2
  Filled 2022-09-06: qty 30, 30d supply, fill #3

## 2022-06-11 ENCOUNTER — Other Ambulatory Visit (HOSPITAL_COMMUNITY): Payer: Self-pay

## 2022-06-14 ENCOUNTER — Ambulatory Visit: Payer: Medicaid Other | Attending: Cardiology | Admitting: Cardiology

## 2022-06-14 ENCOUNTER — Encounter: Payer: Self-pay | Admitting: Cardiology

## 2022-06-14 ENCOUNTER — Other Ambulatory Visit (HOSPITAL_COMMUNITY): Payer: Self-pay

## 2022-06-14 VITALS — BP 100/62 | HR 78 | Ht 68.0 in | Wt 147.4 lb

## 2022-06-14 DIAGNOSIS — I493 Ventricular premature depolarization: Secondary | ICD-10-CM | POA: Diagnosis not present

## 2022-06-14 DIAGNOSIS — I5022 Chronic systolic (congestive) heart failure: Secondary | ICD-10-CM

## 2022-06-14 NOTE — Progress Notes (Signed)
Electrophysiology Office Note   Date:  06/14/2022   ID:  Zachary Arias, DOB 24-Dec-1960, MRN [id number]  PCP:  Governor Specking, MD  Cardiologist:  [place] Primary Electrophysiologist:  Pistol Kessenich Meredith Leeds, MD    Chief Complaint: CHF   History of Present Illness: Zachary Arias is a 61 y.o. male who is being seen today for the evaluation of CHF at the request of Bensimhon, Shaune Pascal, MD. Presenting today for electrophysiology evaluation.  He has a history significant for chronic systolic heart failure due to mixed ischemic and nonischemic cardiomyopathy, coronary artery disease with occluded RCA.  He had a cardiac MRI in 2011 that showed an ejection fraction of 31%.  His brother had a heart transplant and his father has also had a prior heart transplant.  He was admitted to the hospital July 2023 with heart failure and progressive shortness of breath.  He was thought not a candidate for LVAD due to lack of support at home.  Echo showed severe mitral and tricuspid regurgitation.  He is now status post mitral clip.    Today, he denies symptoms of palpitations, chest pain, shortness of breath, orthopnea, PND, lower extremity edema, claudication, dizziness, presyncope, syncope, bleeding, or neurologic sequela. The patient is tolerating medications without difficulties.    Past Medical History:  Diagnosis Date   Congestive heart failure (CHF) (HCC)    secondary to ischemic cardiomyopathy with an ejection fraction of 25-30%   Coronary artery disease    History of hypertension    Inguinal hernia 09/28/2003   History of left inguinal hernia, status post repair in 2005   S/P mitral valve clip implantation 04/15/2022   Mitraclip XTW x2 with Dr. Burt Knack and Dr. Ali Lowe   Tachycardia 09/27/2008    Unexplained tachycardia during hospitalization   Past Surgical History:  Procedure Laterality Date   CARDIAC CATHETERIZATION   06/27/2009   ejection fraction of approximately 35%   IABP INSERTION  N/A 04/14/2022   Procedure: IABP Insertion;  Surgeon: Jolaine Artist, MD;  Location: [place] [facility];  Service: Cardiovascular;  Laterality: N/A;   INGUINAL HERNIA REPAIR  01/27/2004   History of left inguinal hernia, status post repair in 2005   MITRAL VALVE REPAIR N/A 04/15/2022   Procedure: MITRAL VALVE REPAIR;  Surgeon: Sherren Mocha, MD;  Location: [place] [facility];  Service: Cardiovascular;  Laterality: N/A;   RIGHT HEART CATH N/A 04/14/2022   Procedure: RIGHT HEART CATH;  Surgeon: Jolaine Artist, MD;  Location: [place] [facility];  Service: Cardiovascular;  Laterality: N/A;   RIGHT/LEFT HEART CATH AND CORONARY ANGIOGRAPHY N/A 03/10/2020   Procedure: RIGHT/LEFT HEART CATH AND CORONARY ANGIOGRAPHY;  Surgeon: Jolaine Artist, MD;  Location: [place] [facility];  Service: Cardiovascular;  Laterality: N/A;   RIGHT/LEFT HEART CATH AND CORONARY ANGIOGRAPHY N/A 04/12/2022   Procedure: RIGHT/LEFT HEART CATH AND CORONARY ANGIOGRAPHY;  Surgeon: Jolaine Artist, MD;  Location: [place] [facility];  Service: Cardiovascular;  Laterality: N/A;   TEE WITHOUT CARDIOVERSION N/A 04/13/2022   Procedure: TRANSESOPHAGEAL ECHOCARDIOGRAM (TEE);  Surgeon: Jolaine Artist, MD;  Location: [facility];  Service: Cardiovascular;  Laterality: N/A;   TEE WITHOUT CARDIOVERSION N/A 04/15/2022   Procedure: TRANSESOPHAGEAL ECHOCARDIOGRAM (TEE);  Surgeon: Sherren Mocha, MD;  Location: [place] [facility];  Service: Cardiovascular;  Laterality: N/A;     Current Outpatient Medications  Medication Sig Dispense Refill   amiodarone (PACERONE) 200 MG tablet Take 1 tablet (200 mg total) by mouth daily.  30 tablet 11   aspirin EC (ASPIRIN 81) 81 MG tablet Take 1 tablet (81 mg total) by mouth daily. 30 tablet 6   atorvastatin (LIPITOR) 40 MG tablet Take 1 tablet (40 mg total) by mouth daily. 30 tablet 3   dapagliflozin propanediol (FARXIGA) 10 MG TABS tablet Take 1 tablet (10 mg total) by mouth daily for the  heart. 30 tablet 2   LORazepam (ATIVAN) 0.5 MG tablet Take 0.5 mg by mouth every 4 (four) hours as needed for anxiety.     losartan (COZAAR) 25 MG tablet Take 1 tablet (25 mg total) by mouth at bedtime. 90 tablet 3   Multiple Vitamin (MULTIVITAMIN) capsule Take 1 capsule by mouth daily.     spironolactone (ALDACTONE) 25 MG tablet Take 1 tablet by mouth daily. 30 tablet 6   torsemide (DEMADEX) 20 MG tablet Take 1 tablet (20 mg total) by mouth daily. 90 tablet 3   No current facility-administered medications for this visit.    Allergies:   Avocado and Shellfish allergy   Social History:  The patient  reports that he has never smoked. He has never used smokeless tobacco. He reports that he does not currently use alcohol. He reports that he does not currently use drugs.   Family History:  The patient's family history includes Cancer in his mother; Cardiomyopathy in his brother; Heart attack in his father; Heart disease in his father.    ROS:  Please see the history of present illness.   Otherwise, review of systems is positive for none.   All other systems are reviewed and negative.    PHYSICAL EXAM: VS:  BP 100/62   Pulse 78   Ht 5\' 8"  (1.727 m)   Wt 147 lb 6.4 oz (66.9 kg)   SpO2 98%   BMI 22.41 kg/m  , BMI Body mass index is 22.41 kg/m. GEN: Well nourished, well developed, in no acute distress  HEENT: normal  Neck: no JVD, carotid bruits, or masses Cardiac: RRR; no murmurs, rubs, or gallops,no edema  Respiratory:  clear to auscultation bilaterally, normal work of breathing GI: soft, nontender, nondistended, + BS MS: no deformity or atrophy  Skin: warm and dry Neuro:  Strength and sensation are intact Psych: euthymic mood, full affect  EKG:  EKG is ordered today. Personal review of the ekg ordered shows sinus rhythm, rate 78, biatrial enlargement, nonspecific ST/T wave changes  Recent Labs: 04/19/2022: Magnesium 2.2 04/20/2022: Hemoglobin 11.5; Platelets 208 05/27/2022: ALT  60 06/09/2022: B Natriuretic Peptide 372.4; BUN 24; Creatinine, Ser 1.44; Potassium 4.0; Sodium 141    Lipid Panel     Component Value Date/Time   CHOL 179 03/06/2020 0420   TRIG 91 03/06/2020 0420   HDL 42 03/06/2020 0420   CHOLHDL 4.3 03/06/2020 0420   VLDL 18 03/06/2020 0420   LDLCALC 119 (H) 03/06/2020 0420     Wt Readings from Last 3 Encounters:  06/14/22 147 lb 6.4 oz (66.9 kg)  06/09/22 144 lb (65.3 kg)  05/27/22 133 lb (60.3 kg)      Other studies Reviewed: Additional studies/ records that were reviewed today include: TTE 06/09/22  Review of the above records today demonstrates:   1. Left ventricular ejection fraction, by estimation, is 20 to 25%. The  left ventricle has severely decreased function. The left ventricle  demonstrates global hypokinesis. The left ventricular internal cavity size  was severely dilated. Left ventricular  diastolic parameters are indeterminate.   2. There is a small patent  foramen ovale with predominantly left to right  shunting across the atrial septum.   3. Right ventricular systolic function is normal. The right ventricular  size is normal. There is normal pulmonary artery systolic pressure.   4. Left atrial size was severely dilated.   5. Post mitral clip XTWx2 clips A2/P2, mean gradient 5.5 mmHg HR 82 bpm.  Mild to moderate mitral valve regurgitation.   6. The aortic valve is tricuspid. Aortic valve regurgitation is not  visualized.   7. The inferior vena cava is normal in size with greater than 50%  respiratory variability, suggesting right atrial pressure of 3 mmHg.   RHC/LHC 04/12/2022 1. Stable 1v CAD 2. Moderate mixed pulmonary HTN 3. Moderately reduced CO on DBA 5 4. Prominent v waves in PCWP tracing suggestive of significant MR  ASSESSMENT AND PLAN:  1.  Chronic systolic heart failure: Due to mixed ischemic and nonischemic cardiomyopathy.  Currently on optimal medical therapy per heart failure cardiology with losartan 25  mg daily, Farxiga 10 mg daily, Aldactone 20 daily, torsemide 20 mg daily.  No beta-blocker due to recent low cardiac output.  He would likely benefit from ICD therapy.  Risk and benefits were discussed risk of bleeding, tamponade, infection, pneumothorax, lead dislodgment.  He states that as he is feeling quite well and is not having any issues he would prefer to hold off on ICD implant.  I Jatavia Keltner further discuss this with his heart failure cardiologist.  2.  Mitral regurgitation: Status post MitraClip.  Plan per primary cardiology.  3.  Coronary artery disease: Chronically occluded RCA.  Continue aspirin and atorvastatin per primary cardiology.  4.  PVCs: Currently on amiodarone 200 mg daily.  ECG today shows no PVCs.  He is currently feeling well.  He would like to avoid further procedures.  We Everard Interrante continue amiodarone.  We Bora Broner follow-up with his primary cardiologist.   Current medicines are reviewed at length with the patient today.   The patient does not have concerns regarding his medicines.  The following changes were made today:  none  Labs/ tests ordered today include:  Orders Placed This Encounter  Procedures   EKG 12-Lead     Disposition:   FU with Burnard Enis pending decision on ICD   Signed, Mayzie Caughlin Meredith Leeds, MD  06/14/2022 11:07 AM     [place] [address] [place] [address] [phone] (office) [phone] (fax)

## 2022-06-15 ENCOUNTER — Other Ambulatory Visit (HOSPITAL_COMMUNITY): Payer: Self-pay

## 2022-06-17 NOTE — Addendum Note (Signed)
Encounter addended by: Theodoro Parma, RN on: 06/17/2022 4:37 PM  Actions taken: Flowsheet accepted

## 2022-06-17 NOTE — Progress Notes (Signed)
Advanced Heart Failure Clinic Note  PCP: Dr. Ruben Gottron HF Cardiologist: Dr. Haroldine Laws   HPI:  Zachary Arias is a 61 y.o. old male with history of HF due to mixed ischemic/non-ischemic CM (diagnosed 05/2009) with EF 25-30% range. Cath with 1-v CAD with chronically occluded RCA. MRI in January 2011 with EF 31% with inferior scar.    Brother had heart transplant in [place] several months ago. Dad also had previous heart transplant.    He was lost to f/u since 2015. Admitted 03/05/20 with cardiogenic shock after stopping meds for months and using supplements. SBP in 70-80s. ECHO EF 20-25% RV normal. Severe MR. Having frequent PVCs. Placed on amiodarone to suppress PVCs.   [name] 03/10/20 showed stable CAD (chronically occluded distal RCA and moderate non-obstructive CAD elsewhere), low filling pressures and normal cardiac output on milrinone 0.25. Was able to wean off milrinone. Started back on GDMT.   Repeat echo 01/05/21 EF 40-45% mild to mod MR RV mild HK    Lost to f/u.   Follow up 11/2021, off all meds except ASA x 3 months, REDs 34%, Farxiga and losartan restarted, with close follow up to add back GDMT. Zio 2 week placed to quantify PVCs, showed frequent NSVT, frequent PVCs (13.1%). Started on amiodarone 200 mg BID and referred to EP.   Admitted 12/2021 with a/c CHF, had been off all meds x 1 month. Started on IV Lasix. BP dropped and developed AKI, AHF consulted. Empiric DBA started. Echo showed EF 30-35%. Continued IV diuresis, down 20 lbs overall. Failed DBA wean. GDMT titrated. Arranged for home DBA via PICC with Hospice (only option as he is not a citizen and uninsured). He had also refused additional advanced therapies, including VAD, and there were also concerns regarding lack of social support.    Readmitted 03/2022 w/ a/c CHF with progressive SOB. Expressed desire to reconsider advanced therapies, given he had obtained insurance. However, after careful consideration and evaluation by both social work  and VAD team, he was not felt to be candidate for LVAD nor transplant given lack of social support and prior h/o poor compliance. Diuresed with Lasix and home DBA continued. Echo showed EF 25-30%, w/ severe MR/TR. RV systolic function moderately reduced and HK. RHC/LHC on DBA 5 showed  stable 1V CAD, moderate mixed pulmonary HTN, moderately reduced CO, prominent v waves in PCWP tracing suggestive of significant MR. TEE showed  LVEF 25% severe central MR. RV moderately down. Structural heart team consulted and recommended MitraClip. Optimized pre-procedure with IABP. PA pressures elevated on DBA, was switched to Milrinone.0.375 mcg.  S/p TEER with MitraClip G4 XTW devices, 1st to the lateral side of A2/P2 and 2nd to the medial side of A2/P2 w/ excellent result w/ reduction from 4+ -> 2+ MR. Weaned off IABP and off milrinone w/ stable hemodynamics. GDMT titrated, discharged home, weight 135 lbs.   Follow up 04/2022, improved NYHA functional class symptoms. Low dose losartan added. Digoxin stopped with elevated level.  Returned to [facility] for HF follow up 06/09/22. Overall was feeling fine. He was not SOB walking up steps to his apartment. Denied abnormal bleeding, palpitations, CP, dizziness, edema, or PND/Orthopnea. Appetite was ok. No fever or chills. Weight at home was 138-140 pounds. Reported taking all medications.    Today he returns to HF clinic for pharmacist medication titration. At last visit with APP, losartan was increased to 25 mg daily. Overall he is feeling well today. No dizziness, lightheadedness, CP or palpitations. No  SOB/DOE. States he is able to walk a couple of blocks before needing to rest. Is also able to go up the 2 flights of stairs to his apartment without stopping. He used to have to stop on every step. Weight at home has been ~145 lbs. States he has been gaining weight slowly over the last few months, which he attributes to getting stronger after discharge from the hospital. He  takes torsemide 20 mg daily and has not needed any extra. No LEE, PND or orthopnea. Appetite is good and he has been following a low salt diet. Taking all medications as prescribed and tolerating all medications.     HF Medications: Losartan 25 mg daily Spironolactone 25 mg daily Farxiga 10 mg daily Torsemide 20 mg daily  Has the patient been experiencing any side effects to the medications prescribed?  no  Does the patient have any problems obtaining medications due to transportation or finances?   [employer] Medicaid  Understanding of regimen: good Understanding of indications: good Potential of compliance: good Patient understands to avoid NSAIDs. Patient understands to avoid decongestants.    Pertinent Lab Values: 06/09/22: Serum creatinine 1.44, BUN 24, Potassium 4.0, Sodium 141  Vital Signs: Weight: 148 lbs (last clinic weight: 147.4 lbs) Blood pressure: 112/74  Heart rate: 67   Assessment/Plan: 1.  Chronic Systolic Heart Failure - Cath 1-v CAD chronically occluded RCA. MRI in January 2011 EF 31% with inferior scar.  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   - Echo (04/2012): EF 45-50% .  - No f/u between to 2015-2021 - Echo (02/2020): EF 20-25% severe central MR. Repeat cath 6/21 with stable CAD - Echo (12/2020): EF 40-45% mild to mod MR RV mild HK  - Echo (12/2021): EF 30-35%.   - Admit 12/2021 with shock, started on DBA and lasix. Diuresed 20 lbs. Unable to wean DBA discharged on home DBA 5 mcg/kg/min with hospice care - Echo (04/10/22): EF 25-30% Severe MR/TR. Moderate RV HK.  - RHC/LHC with stable 1V disease, moderate mixed pulmonary HTN, moderately reduced CO on DBA 5, and prominent v waves in PCWP tracing suggestive of significant MR.  - s/p mTEER 03/2022 - Echo 05/2022 EF 20-25% with global HK, RV normal - NYHA Class I-II, Euvolemic on exam.  - Start carvedilol 3.125 mg BID - Continue torsemide 20 mg daily. - Continue losartan 25  mg qhs. - Continue spironolactone 25 mg daily  - Continue Farxiga 10 mg daily  - Off digoxin with elevated level.   2. Mitral Regurgitation  - S/P mTEER 04/15/22 w/ reduction from  MR 4+ -> 2+  - echo 05/2022: Post mitral clip XTWx2 clips A2/P2, mean gradient 5.5 mmHg HR 82 bpm. Mild to moderate mitral valve regurgitation.  - Needs follow up with Structural heart team.   3. CAD  - Cath 03/2022 w/ stable with 1-v CAD with chronically occluded RCA - Denies CP.  - Continue ASA and atorvastatin.    4. PVCs - asymptomatic. - Continue amiodarone 200 mg daily.    5. CKD Stage IIIa  - Baseline SCr 1.4 - On Farxiga.     Follow up 3 weeks with [place], PharmD, BCPS, BCCP, CPP Heart Failure Clinic Pharmacist [phone]

## 2022-06-23 ENCOUNTER — Other Ambulatory Visit (HOSPITAL_COMMUNITY): Payer: Self-pay

## 2022-07-01 ENCOUNTER — Ambulatory Visit (HOSPITAL_COMMUNITY)
Admission: RE | Admit: 2022-07-01 | Discharge: 2022-07-01 | Disposition: A | Discharge status: Home / Self Care | Payer: Medicaid Other | Source: Ambulatory Visit | Attending: Internal Medicine | Admitting: Internal Medicine

## 2022-07-01 ENCOUNTER — Other Ambulatory Visit (HOSPITAL_COMMUNITY): Payer: Self-pay

## 2022-07-01 DIAGNOSIS — N1831 Chronic kidney disease, stage 3a: Secondary | ICD-10-CM | POA: Diagnosis not present

## 2022-07-01 DIAGNOSIS — I272 Pulmonary hypertension, unspecified: Secondary | ICD-10-CM | POA: Insufficient documentation

## 2022-07-01 DIAGNOSIS — I493 Ventricular premature depolarization: Secondary | ICD-10-CM | POA: Insufficient documentation

## 2022-07-01 DIAGNOSIS — I429 Cardiomyopathy, unspecified: Secondary | ICD-10-CM | POA: Diagnosis not present

## 2022-07-01 DIAGNOSIS — I5022 Chronic systolic (congestive) heart failure: Secondary | ICD-10-CM | POA: Diagnosis not present

## 2022-07-01 DIAGNOSIS — I081 Rheumatic disorders of both mitral and tricuspid valves: Secondary | ICD-10-CM | POA: Insufficient documentation

## 2022-07-01 DIAGNOSIS — I2510 Atherosclerotic heart disease of native coronary artery without angina pectoris: Secondary | ICD-10-CM | POA: Insufficient documentation

## 2022-07-01 DIAGNOSIS — Z7984 Long term (current) use of oral hypoglycemic drugs: Secondary | ICD-10-CM | POA: Insufficient documentation

## 2022-07-01 DIAGNOSIS — I130 Hypertensive heart and chronic kidney disease with heart failure and stage 1 through stage 4 chronic kidney disease, or unspecified chronic kidney disease: Secondary | ICD-10-CM | POA: Diagnosis not present

## 2022-07-01 DIAGNOSIS — Z7982 Long term (current) use of aspirin: Secondary | ICD-10-CM | POA: Diagnosis not present

## 2022-07-01 DIAGNOSIS — Z79899 Other long term (current) drug therapy: Secondary | ICD-10-CM | POA: Diagnosis not present

## 2022-07-01 MED ORDER — CARVEDILOL 3.125 MG PO TABS
3.1250 mg | ORAL_TABLET | Freq: Two times a day (BID) | ORAL | 3 refills | Status: DC
  Filled 2022-07-01: qty 180, 90d supply, fill #0

## 2022-07-01 NOTE — Patient Instructions (Signed)
It was a pleasure seeing you today!  MEDICATIONS: -We are changing your medications today -Start carvedilol 3.125 mg (1 tablet) twice daily -Call if you have questions about your medications.    NEXT APPOINTMENT: Return to clinic in 3 weeks with [facility].  In general, to take care of your heart failure: -Limit your fluid intake to 2 Liters (half-gallon) per day.   -Limit your salt intake to ideally 2-3 grams (2000-3000 mg) per day. -Weigh yourself daily and record, and bring that "weight diary" to your next appointment.  (Weight gain of 2-3 pounds in 1 day typically means fluid weight.) -The medications for your heart are to help your heart and help you live longer.   -Please contact us before stopping any of your heart medications.  Call the clinic at [phone] with questions or to reschedule future appointments.

## 2022-07-03 ENCOUNTER — Other Ambulatory Visit (HOSPITAL_COMMUNITY): Payer: Self-pay

## 2022-07-12 ENCOUNTER — Other Ambulatory Visit (HOSPITAL_COMMUNITY): Payer: Self-pay

## 2022-07-13 ENCOUNTER — Other Ambulatory Visit (HOSPITAL_COMMUNITY): Payer: Self-pay

## 2022-07-19 ENCOUNTER — Encounter (HOSPITAL_COMMUNITY): Payer: Medicaid Other

## 2022-07-23 ENCOUNTER — Other Ambulatory Visit (HOSPITAL_COMMUNITY): Payer: Self-pay

## 2022-07-30 NOTE — Progress Notes (Signed)
PATIENT NO SHOW FOR APPT, NOTE LEFT FOR TEMPLATE PURPOSES                 Advanced Heart Failure Clinic Note   Patient ID: Zachary Arias, male   DOB: May 14, 1961, 61 y.o.   MRN: [id number] PCP: [name] HF Cardiologist: Dr. Gala Romney   HPI: Zachary Arias is a 61 y.o. 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 Cath with 1-v CAD with chronically occluded RCA. MRI in January 2011 with EF 31% with inferior scar.   Brother had heart transplant in [place] several months ago. Dad also had previous heart transplant.   He was lost to f/u since 2015. Admitted 03/05/20 with cardiogenic shock after stopping meds for months and using supplements. SBP in 70-80s. ECHO EF 20-25% RV normal. Severe MR. Having frequent PVCs. Placed on amiodarone to suppress PVCs  [facility] 03/10/20 showed stable CAD (chronically occluded distal RCA and moderate non-obstructive CAD elsewhere), low filling pressures and normal cardiac output on milrinone 0.25. Was able to wean off milrinone. Started back on GDMT.  Repeat echo 01/05/21 EF 40-45% mild to mod MR RV mild HK   Lost to f/u.  Follow up 3/23, off all meds except ASA x 3 months, REDs 34%, Farxiga and losartan restarted, with close follow to add back GDMT. Zio 2 week placed to quantify PVCs, showed frequent NSVT, frequent PVCs (13.1%). Started on amio 200 mg bid and referred to EP.   Admitted 4/23 with a/c CHF, had been off all meds x 1 month. Started on IV lasix. BP dropped and developed AKI, AHF consulted. Empiric DBA started. Echo showed EF 30-35%. Continued IV diuresis, down 20 lbs overall. Failed DBA wean. GDMT titrated. Arranged for home DBA via PICC with Hospice (only option as he is not a citizen and uninsured). He had also refused additional advanced thearpies, including VAD, and there were also concerns regarding lack of social supported.   Readmitted 7/23 w/ a/c CHF with progressive SOB. Expressed desire to  reconsider advanced therapies, given he had obtained insurance. However, after careful consideration and evaluation by both social work and VAD team, he was not felt to be candidate for LVAD nor transplant given lack of social support and prior h/o poor compliance. Diuresed with Lasix and home DBA continued. Echo showed EF 25-30%, w/ severe MR/TR. RV systolic function moderately and HK. RHC/LHC on DBA 5 showed  stable 1V CAD, moderate mixed pulmonary HTN, moderately reduced CO, prominent v waves in PCWP tracing suggestive of significant MR. TEE showed  LVEF 25% severe central MR. RV moderately down. Structural heart team consulted and recommended MitraClip. Optimized pre-procedure with IABP. PA pressures elevated on DBA, was switched to Milrinone.0.375 mcg.  S/p TEER with MitraClip G4 XTW devices, 1st to the lateral side of A2/P2 and 2nd to the medial side of A2/P2 w/ excellent result w/ reduction from 4+ -> 2+ MR. Weaned off IABP and off milrinone w/ stable hemodynamics. GDMT titrated, discharged home, weight 135 lbs.  Follow up 8/23, improved NYHA. Low dose losartan added. Dig stopped with elevated level.  Today he returns for HF follow up. Overall feeling fine. He is not SOB walking up steps to his apartment. Denies abnormal bleeding, palpitations, CP, dizziness, edema, or PND/Orthopnea. Appetite ok. No fever or chills. Weight at home 138-140 pounds. Taking all medications.    Cardiac Studies: - Echo (4/11): EF 30-35%. - Echo (8/13): EF 45-50% with inferior and  inferoseptal hypokinesis - Echo (10/21): EF 30-35% - Echo (4/22): EF 40-45% mild to mod MR RV mild HK  - Zio 2 week (3/23): 13.1% PVCs - Echo (5/23):EF 30-35% - Echo (7/23): EF 25-30%, severe MR/TR, RV moderately reduced and HK - R/LHC (7/23): stable 1V CAD, moderate mixed pulmonary HTN, moderately reduced CO on DBA 5, prominent v waves in PCWP tracing suggestive of significant MR - TEE  (7/23): EF 25%, severe central MR, RV moderately  reduced - Echo (7/23 post MitraClip) EF 25%, mild-mod residual MR   SH: Lives in [place], nonsmoker. He has been disabled since May. He needs assistance with transportation. Lives alone. He uses [employer].   FH: No premature CAD  ROS: All systems negative except as listed in HPI, PMH and Problem List.  Past Medical History:  Diagnosis Date   Congestive heart failure (CHF) (HCC)    secondary to ischemic cardiomyopathy with an ejection fraction of 25-30%   Coronary artery disease    History of hypertension    Inguinal hernia 09/28/2003   History of left inguinal hernia, status post repair in 2005   S/P mitral valve clip implantation 04/15/2022   Mitraclip XTW x2 with Dr. Burt Knack and Dr. Ali Lowe   Tachycardia 09/27/2008    Unexplained tachycardia during hospitalization   Current Outpatient Medications  Medication Sig Dispense Refill   amiodarone (PACERONE) 200 MG tablet Take 1 tablet (200 mg total) by mouth daily. 30 tablet 11   aspirin EC (ASPIRIN 81) 81 MG tablet Take 1 tablet (81 mg total) by mouth daily. 30 tablet 6   atorvastatin (LIPITOR) 40 MG tablet Take 1 tablet (40 mg total) by mouth daily. 30 tablet 3   carvedilol (COREG) 3.125 MG tablet Take 1 tablet (3.125 mg total) by mouth 2 (two) times daily. 180 tablet 3   dapagliflozin propanediol (FARXIGA) 10 MG TABS tablet Take 1 tablet (10 mg total) by mouth daily for the heart. 30 tablet 2   LORazepam (ATIVAN) 0.5 MG tablet Take 0.5 mg by mouth every 4 (four) hours as needed for anxiety.     losartan (COZAAR) 25 MG tablet Take 1 tablet (25 mg total) by mouth at bedtime. 90 tablet 3   Multiple Vitamin (MULTIVITAMIN) capsule Take 1 capsule by mouth daily.     spironolactone (ALDACTONE) 25 MG tablet Take 1 tablet by mouth daily. 30 tablet 6   torsemide (DEMADEX) 20 MG tablet Take 1 tablet (20 mg total) by mouth daily. 90 tablet 3   No current facility-administered medications for this visit.   There were no  vitals taken for this visit.  Wt Readings from Last 3 Encounters:  06/14/22 66.9 kg (147 lb 6.4 oz)  06/09/22 65.3 kg (144 lb)  05/27/22 60.3 kg (133 lb)   PHYSICAL EXAM: General:  NAD. No resp difficulty HEENT: Normal Neck: Supple. No JVD. Carotids 2+ bilat; no bruits. No lymphadenopathy or thryomegaly appreciated. Cor: PMI nondisplaced. Regular rate & rhythm. No rubs, gallops, 2/6 MR Lungs: Clear Abdomen: Soft, nontender, nondistended. No hepatosplenomegaly. No bruits or masses. Good bowel sounds. Extremities: No cyanosis, clubbing, rash, edema Neuro: Alert & oriented x 3, cranial nerves grossly intact. Moves all 4 extremities w/o difficulty. Affect pleasant.  ASSESSMENT & PLAN: 1.  Chronic Systolic Heart Failure - Cath 1-v CAD chronically occluded RCA. MRI in January 2011 EF 31% with inferior scar.  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   - Echo (8/13): EF 45-50% .  - No  f/u between to 2015-2021 - Echo (6/21): EF 20-25% severe central MR. Repeat cath 6/21 with stable CAD - Echo (4/22): EF 40-45% mild to mod MR RV mild HK  - Echo (4/23): EF 30-35%.   - Admit 4/23 with shock, started on DBA and lasix. Diuresed 20 lbs. Unable to wean DBA discharged on home DBA 5 mcg/kg/min with hospice care - Echo (04/10/22): EF 25-30% Severe MR/TR. Moderate RV HK.  - RHC/LHC with stable 1V disease, moderate mixed pulmonary HTN, moderately reduced CO on DBA 5, and prominent v waves in PCWP tracing suggestive of significant MR.  - s/p mTEER 7/23 - NYHA Class I-II, Volume stable on exam.  - Increase losartan to 25 mg qhs. - Continue Farxiga 10 mg daily  - Continue spironolactone 25 mg daily  - Continue torsemide 20 mg daily. - Off digoxin with elevated level. - No ? blocker yet w/ recent low output, plan to add back soon. - Labs today.  2. Mitral Regurgitation  - S/P mTEER 04/15/22 w/ reduction from  MR 4+ -> 2+  - Needs follow up with Structural heart  team. - Repeat echo today, results pending.  3. CAD  - Cath 7/23 w/ stable with 1-v CAD with chronically occluded RCA - Denies CP.  - Continue ASA and atorvastatin.   4. PVCs - asymptomatic. - Continue amio 200 mg daily.  - Keep consultation w/ EP on 9/18    5. CKD Stage IIIa  - Baseline SCr 1.4 - On Farxiga.  - Labs today.  Follow up with PharmD in 3 weeks (add beta blocker) and Dr. Haroldine Laws in 3 months.  Rafael Bihari, FNP  9:54 AM

## 2022-08-02 ENCOUNTER — Inpatient Hospital Stay (HOSPITAL_COMMUNITY)
Admission: RE | Admit: 2022-08-02 | Discharge: 2022-08-02 | Disposition: A | Discharge status: Home / Self Care | Payer: Medicaid Other | Source: Ambulatory Visit

## 2022-08-05 ENCOUNTER — Other Ambulatory Visit (HOSPITAL_COMMUNITY): Payer: Self-pay

## 2022-08-12 ENCOUNTER — Other Ambulatory Visit (HOSPITAL_COMMUNITY): Payer: Self-pay | Admitting: Family Medicine

## 2022-08-12 ENCOUNTER — Other Ambulatory Visit (HOSPITAL_COMMUNITY): Payer: Self-pay

## 2022-08-12 MED ORDER — DAPAGLIFLOZIN PROPANEDIOL 10 MG PO TABS
10.0000 mg | ORAL_TABLET | Freq: Every day | ORAL | 2 refills | Status: DC
  Filled 2022-08-12 (×2): qty 30, 30d supply, fill #0

## 2022-08-23 NOTE — Addendum Note (Signed)
Encounter addended by: Jacklynn Ganong, FNP on: 08/23/2022 3:40 PM  Actions taken: Clinical Note Signed

## 2022-08-27 ENCOUNTER — Encounter (HOSPITAL_COMMUNITY): Payer: Medicaid Other

## 2022-08-27 NOTE — Progress Notes (Incomplete)
PATIENT NO SHOW FOR APPT, NOTE LEFT FOR TEMPLATE PURPOSES                 Advanced Heart Failure Clinic Note   Patient ID: Zachary Arias, male   DOB: May 14, 1961, 61 y.o.   MRN: [id number] PCP: [name] HF Cardiologist: Dr. Gala Romney   HPI: Zachary Arias is a 61 y.o. 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 Cath with 1-v CAD with chronically occluded RCA. MRI in January 2011 with EF 31% with inferior scar.   Brother had heart transplant in [place] several months ago. Dad also had previous heart transplant.   He was lost to f/u since 2015. Admitted 03/05/20 with cardiogenic shock after stopping meds for months and using supplements. SBP in 70-80s. ECHO EF 20-25% RV normal. Severe MR. Having frequent PVCs. Placed on amiodarone to suppress PVCs  [facility] 03/10/20 showed stable CAD (chronically occluded distal RCA and moderate non-obstructive CAD elsewhere), low filling pressures and normal cardiac output on milrinone 0.25. Was able to wean off milrinone. Started back on GDMT.  Repeat echo 01/05/21 EF 40-45% mild to mod MR RV mild HK   Lost to f/u.  Follow up 3/23, off all meds except ASA x 3 months, REDs 34%, Farxiga and losartan restarted, with close follow to add back GDMT. Zio 2 week placed to quantify PVCs, showed frequent NSVT, frequent PVCs (13.1%). Started on amio 200 mg bid and referred to EP.   Admitted 4/23 with a/c CHF, had been off all meds x 1 month. Started on IV lasix. BP dropped and developed AKI, AHF consulted. Empiric DBA started. Echo showed EF 30-35%. Continued IV diuresis, down 20 lbs overall. Failed DBA wean. GDMT titrated. Arranged for home DBA via PICC with Hospice (only option as he is not a citizen and uninsured). He had also refused additional advanced thearpies, including VAD, and there were also concerns regarding lack of social supported.   Readmitted 7/23 w/ a/c CHF with progressive SOB. Expressed desire to  reconsider advanced therapies, given he had obtained insurance. However, after careful consideration and evaluation by both social work and VAD team, he was not felt to be candidate for LVAD nor transplant given lack of social support and prior h/o poor compliance. Diuresed with Lasix and home DBA continued. Echo showed EF 25-30%, w/ severe MR/TR. RV systolic function moderately and HK. RHC/LHC on DBA 5 showed  stable 1V CAD, moderate mixed pulmonary HTN, moderately reduced CO, prominent v waves in PCWP tracing suggestive of significant MR. TEE showed  LVEF 25% severe central MR. RV moderately down. Structural heart team consulted and recommended MitraClip. Optimized pre-procedure with IABP. PA pressures elevated on DBA, was switched to Milrinone.0.375 mcg.  S/p TEER with MitraClip G4 XTW devices, 1st to the lateral side of A2/P2 and 2nd to the medial side of A2/P2 w/ excellent result w/ reduction from 4+ -> 2+ MR. Weaned off IABP and off milrinone w/ stable hemodynamics. GDMT titrated, discharged home, weight 135 lbs.  Follow up 8/23, improved NYHA. Low dose losartan added. Dig stopped with elevated level.  Today he returns for HF follow up. Overall feeling fine. He is not SOB walking up steps to his apartment. Denies abnormal bleeding, palpitations, CP, dizziness, edema, or PND/Orthopnea. Appetite ok. No fever or chills. Weight at home 138-140 pounds. Taking all medications.    Cardiac Studies: - Echo (4/11): EF 30-35%. - Echo (8/13): EF 45-50% with inferior and  inferoseptal hypokinesis - Echo (10/21): EF 30-35% - Echo (4/22): EF 40-45% mild to mod MR RV mild HK  - Zio 2 week (3/23): 13.1% PVCs - Echo (5/23):EF 30-35% - Echo (7/23): EF 25-30%, severe MR/TR, RV moderately reduced and HK - R/LHC (7/23): stable 1V CAD, moderate mixed pulmonary HTN, moderately reduced CO on DBA 5, prominent v waves in PCWP tracing suggestive of significant MR - TEE  (7/23): EF 25%, severe central MR, RV moderately  reduced - Echo (7/23 post MitraClip) EF 25%, mild-mod residual MR   SH: Lives in [place], nonsmoker. He has been disabled since May. He needs assistance with transportation. Lives alone. He uses personal [employer] groceries.   FH: No premature CAD  ROS: All systems negative except as listed in HPI, PMH and Problem List.  Past Medical History:  Diagnosis Date   Congestive heart failure (CHF) (HCC)    secondary to ischemic cardiomyopathy with an ejection fraction of 25-30%   Coronary artery disease    History of hypertension    Inguinal hernia 09/28/2003   History of left inguinal hernia, status post repair in 2005   S/P mitral valve clip implantation 04/15/2022   Mitraclip XTW x2 with Dr. Burt Knack and Dr. Ali Lowe   Tachycardia 09/27/2008    Unexplained tachycardia during hospitalization   Current Outpatient Medications  Medication Sig Dispense Refill   amiodarone (PACERONE) 200 MG tablet Take 1 tablet (200 mg total) by mouth daily. 30 tablet 11   aspirin EC (ASPIRIN 81) 81 MG tablet Take 1 tablet (81 mg total) by mouth daily. 30 tablet 6   atorvastatin (LIPITOR) 40 MG tablet Take 1 tablet (40 mg total) by mouth daily. 30 tablet 3   carvedilol (COREG) 3.125 MG tablet Take 1 tablet (3.125 mg total) by mouth 2 (two) times daily. 180 tablet 3   dapagliflozin propanediol (FARXIGA) 10 MG TABS tablet Take 1 tablet (10 mg total) by mouth daily for the heart. 30 tablet 2   LORazepam (ATIVAN) 0.5 MG tablet Take 0.5 mg by mouth every 4 (four) hours as needed for anxiety.     losartan (COZAAR) 25 MG tablet Take 1 tablet (25 mg total) by mouth at bedtime. 90 tablet 3   Multiple Vitamin (MULTIVITAMIN) capsule Take 1 capsule by mouth daily.     spironolactone (ALDACTONE) 25 MG tablet Take 1 tablet by mouth daily. 30 tablet 6   torsemide (DEMADEX) 20 MG tablet Take 1 tablet (20 mg total) by mouth daily. 90 tablet 3   No current facility-administered medications for this visit.   There were no  vitals taken for this visit.  Wt Readings from Last 3 Encounters:  06/14/22 66.9 kg (147 lb 6.4 oz)  06/09/22 65.3 kg (144 lb)  05/27/22 60.3 kg (133 lb)   PHYSICAL EXAM: General:  NAD. No resp difficulty HEENT: Normal Neck: Supple. No JVD. Carotids 2+ bilat; no bruits. No lymphadenopathy or thryomegaly appreciated. Cor: PMI nondisplaced. Regular rate & rhythm. No rubs, gallops, 2/6 MR Lungs: Clear Abdomen: Soft, nontender, nondistended. No hepatosplenomegaly. No bruits or masses. Good bowel sounds. Extremities: No cyanosis, clubbing, rash, edema Neuro: Alert & oriented x 3, cranial nerves grossly intact. Moves all 4 extremities w/o difficulty. Affect pleasant.  ASSESSMENT & PLAN: 1.  Chronic Systolic Heart Failure - Cath 1-v CAD chronically occluded RCA. MRI in January 2011 EF 31% with inferior scar.  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   - Echo (8/13): EF 45-50% .  - No  f/u between to 2015-2021 - Echo (6/21): EF 20-25% severe central MR. Repeat cath 6/21 with stable CAD - Echo (4/22): EF 40-45% mild to mod MR RV mild HK  - Echo (4/23): EF 30-35%.   - Admit 4/23 with shock, started on DBA and lasix. Diuresed 20 lbs. Unable to wean DBA discharged on home DBA 5 mcg/kg/min with hospice care - Echo (04/10/22): EF 25-30% Severe MR/TR. Moderate RV HK.  - RHC/LHC with stable 1V disease, moderate mixed pulmonary HTN, moderately reduced CO on DBA 5, and prominent v waves in PCWP tracing suggestive of significant MR.  - s/p mTEER 7/23 - NYHA Class I-II, Volume stable on exam.  - Increase losartan to 25 mg qhs. - Continue Farxiga 10 mg daily  - Continue spironolactone 25 mg daily  - Continue torsemide 20 mg daily. - Off digoxin with elevated level. - No ? blocker yet w/ recent low output, plan to add back soon. - Labs today.  2. Mitral Regurgitation  - S/P mTEER 04/15/22 w/ reduction from  MR 4+ -> 2+  - Needs follow up with Structural heart  team. - Repeat echo today, results pending.  3. CAD  - Cath 7/23 w/ stable with 1-v CAD with chronically occluded RCA - Denies CP.  - Continue ASA and atorvastatin.   4. PVCs - asymptomatic. - Continue amio 200 mg daily.  - Keep consultation w/ EP on 9/18    5. CKD Stage IIIa  - Baseline SCr 1.4 - On Farxiga.  - Labs today.  Follow up with PharmD in 3 weeks (add beta blocker) and Dr. Gala Romney in 3 months.  Jacklynn Ganong, FNP  8:00 AM

## 2022-09-06 ENCOUNTER — Other Ambulatory Visit (HOSPITAL_COMMUNITY): Payer: Self-pay | Admitting: Family Medicine

## 2022-09-07 ENCOUNTER — Other Ambulatory Visit (HOSPITAL_COMMUNITY): Payer: Self-pay

## 2022-09-07 MED ORDER — ATORVASTATIN CALCIUM 40 MG PO TABS
40.0000 mg | ORAL_TABLET | Freq: Every day | ORAL | 3 refills | Status: DC
  Filled 2022-09-07: qty 30, 30d supply, fill #0

## 2022-09-08 ENCOUNTER — Other Ambulatory Visit (HOSPITAL_COMMUNITY): Payer: Self-pay

## 2022-09-17 ENCOUNTER — Telehealth (HOSPITAL_COMMUNITY): Payer: Self-pay | Admitting: Licensed Clinical Social Worker

## 2022-09-17 ENCOUNTER — Ambulatory Visit (HOSPITAL_COMMUNITY)
Admission: RE | Admit: 2022-09-17 | Discharge: 2022-09-17 | Disposition: A | Discharge status: Home / Self Care | Payer: Medicare Other | Source: Ambulatory Visit | Attending: Internal Medicine | Admitting: Internal Medicine

## 2022-09-17 ENCOUNTER — Encounter (HOSPITAL_COMMUNITY): Payer: Self-pay | Admitting: Internal Medicine

## 2022-09-17 VITALS — BP 102/68 | HR 68 | Wt 153.6 lb

## 2022-09-17 DIAGNOSIS — Z79899 Other long term (current) drug therapy: Secondary | ICD-10-CM | POA: Insufficient documentation

## 2022-09-17 DIAGNOSIS — I130 Hypertensive heart and chronic kidney disease with heart failure and stage 1 through stage 4 chronic kidney disease, or unspecified chronic kidney disease: Secondary | ICD-10-CM | POA: Insufficient documentation

## 2022-09-17 DIAGNOSIS — Z95818 Presence of other cardiac implants and grafts: Secondary | ICD-10-CM

## 2022-09-17 DIAGNOSIS — I5022 Chronic systolic (congestive) heart failure: Secondary | ICD-10-CM | POA: Insufficient documentation

## 2022-09-17 DIAGNOSIS — Z9889 Other specified postprocedural states: Secondary | ICD-10-CM

## 2022-09-17 DIAGNOSIS — Z7984 Long term (current) use of oral hypoglycemic drugs: Secondary | ICD-10-CM | POA: Diagnosis not present

## 2022-09-17 DIAGNOSIS — I272 Pulmonary hypertension, unspecified: Secondary | ICD-10-CM | POA: Insufficient documentation

## 2022-09-17 DIAGNOSIS — I2510 Atherosclerotic heart disease of native coronary artery without angina pectoris: Secondary | ICD-10-CM | POA: Diagnosis not present

## 2022-09-17 DIAGNOSIS — I493 Ventricular premature depolarization: Secondary | ICD-10-CM | POA: Diagnosis not present

## 2022-09-17 DIAGNOSIS — N1831 Chronic kidney disease, stage 3a: Secondary | ICD-10-CM | POA: Insufficient documentation

## 2022-09-17 DIAGNOSIS — I340 Nonrheumatic mitral (valve) insufficiency: Secondary | ICD-10-CM | POA: Diagnosis not present

## 2022-09-17 DIAGNOSIS — I255 Ischemic cardiomyopathy: Secondary | ICD-10-CM | POA: Diagnosis not present

## 2022-09-17 LAB — CBC
HCT: 44.1 % (ref 39.0–52.0)
Hemoglobin: 14.1 g/dL (ref 13.0–17.0)
MCH: 27.5 pg (ref 26.0–34.0)
MCHC: 32 g/dL (ref 30.0–36.0)
MCV: 86.1 fL (ref 80.0–100.0)
Platelets: 263 10*3/uL (ref 150–400)
RBC: 5.12 MIL/uL (ref 4.22–5.81)
RDW: 16.3 % — ABNORMAL HIGH (ref 11.5–15.5)
WBC: 8.6 10*3/uL (ref 4.0–10.5)
nRBC: 0 % (ref 0.0–0.2)

## 2022-09-17 LAB — COMPREHENSIVE METABOLIC PANEL
ALT: 107 U/L — ABNORMAL HIGH (ref 0–44)
AST: 56 U/L — ABNORMAL HIGH (ref 15–41)
Albumin: 4.2 g/dL (ref 3.5–5.0)
Alkaline Phosphatase: 45 U/L (ref 38–126)
Anion gap: 8 (ref 5–15)
BUN: 23 mg/dL — ABNORMAL HIGH (ref 6–20)
CO2: 27 mmol/L (ref 22–32)
Calcium: 9.3 mg/dL (ref 8.9–10.3)
Chloride: 106 mmol/L (ref 98–111)
Creatinine, Ser: 1.42 mg/dL — ABNORMAL HIGH (ref 0.61–1.24)
GFR, Estimated: 57 mL/min — ABNORMAL LOW (ref >60)
Glucose, Bld: 88 mg/dL (ref 70–99)
Potassium: 4.6 mmol/L (ref 3.5–5.1)
Sodium: 141 mmol/L (ref 135–145)
Total Bilirubin: 0.9 mg/dL (ref 0.3–1.2)
Total Protein: 6.5 g/dL (ref 6.5–8.1)

## 2022-09-17 LAB — T4, FREE: Free T4: 0.64 ng/dL (ref 0.61–1.12)

## 2022-09-17 LAB — TSH: TSH: 5.556 u[IU]/mL — ABNORMAL HIGH (ref 0.350–4.500)

## 2022-09-17 LAB — BRAIN NATRIURETIC PEPTIDE: B Natriuretic Peptide: 123.2 pg/mL — ABNORMAL HIGH (ref 0.0–100.0)

## 2022-09-17 NOTE — Telephone Encounter (Signed)
09/17/2022  Zachary Arias DOB: [birth year] MRN: [id number]   RIDER WAIVER AND RELEASE OF LIABILITY  For the purposes of helping with transportation needs, [place] [facility] partners with outside transportation providers (taxi companies, [place], Catering manager.) to give [employer] patients or other approved people the choice of on-demand rides [employer]") to our buildings for non-emergency visits.  By using [employer], I, the person signing this document, on behalf of myself and/or any legal minors (in my care using the [employer]), agree:  Science writer given to me are supplied by independent, outside transportation providers who do not work for, or have any affiliation with, [employer]. [place] is not a transportation company. [place] has no control over the quality or safety of the rides I get using [employer]. [place] has no control over whether any outside ride will happen on time or not. [place] gives no guarantee on the reliability, quality, safety, or availability on any rides, or that no mistakes will happen. I know and accept that traveling by vehicle (car, truck, SVU, Zenaida Niece, bus, taxi, etc.) has risks of serious injuries such as disability, being paralyzed, and death. I know and agree the risk of using [employer] is mine alone, and not [employer]. Transport Services are provided "as is" and as are available. The transportation providers are in charge for all inspections and care of the vehicles used to provide these rides. I agree not to take legal action against [facility], its agents, employees, officers, directors, representatives, insurers, attorneys, assigns, successors, subsidiaries, and affiliates at any time for any reasons related directly or indirectly to using [employer]. I also agree not to take legal action against [place] or its affiliates for any injury, death, or damage to property caused by or related to using  [employer]. I have read this Waiver and Release of Liability, and I understand the terms used in it and their legal meaning. This Waiver is freely and voluntarily given with the understanding that my right (or any legal minors) to legal action against [place] relating to [employer] is knowingly given up to use these services.   I attest that I read the Ride Waiver and Release of Liability to Zachary Arias, gave Mr. Markwood the opportunity to ask questions and answered the questions asked (if any). I affirm that Eshawn Coor then provided consent for assistance with transportation.     Burna Sis

## 2022-09-17 NOTE — Patient Instructions (Signed)
Medication Changes:  None, Continue current medications  Lab Work:  Labs done today, your results will be available in MyChart, we will contact you for abnormal readings.   Testing/Procedures:  none  Referrals:  none  Special Instructions // Education:  Do the following things EVERYDAY: Weigh yourself in the morning before breakfast. Write it down and keep it in a log. Take your medicines as prescribed Eat low salt foods--Limit salt (sodium) to 2000 mg per day.  Stay as active as you can everyday Limit all fluids for the day to less than 2 liters   Follow-Up in: 4 months  At the [facility] Clinic, you and your health needs are our priority. We have a designated team specialized in the treatment of Heart Failure. This Care Team includes your primary Heart Failure Specialized Cardiologist (physician), Advanced Practice Providers (APPs- Physician Assistants and Nurse Practitioners), and Pharmacist who all work together to provide you with the care you need, when you need it.   You may see any of the following providers on your designated Care Team at your next follow up:  Dr. Arvilla Meres Dr. Marca Ancona Dr. Marcos Eke, NP Robbie Lis, [place] [facility] [place], [place] Brynda Peon, NP [name], PharmD   Please be sure to bring in all your medications bottles to every appointment.   Need to Contact us:  If you have any questions or concerns before your next appointment please send [place] a message through [place] or call our office at [phone].    TO LEAVE A MESSAGE FOR THE NURSE SELECT OPTION 2, PLEASE LEAVE A MESSAGE INCLUDING: YOUR NAME DATE OF BIRTH CALL BACK NUMBER REASON FOR CALL**this is important as we prioritize the call backs  YOU WILL RECEIVE A CALL BACK THE SAME DAY AS LONG AS YOU CALL BEFORE 4:00 PM

## 2022-09-17 NOTE — Progress Notes (Signed)
Advanced Heart Failure Clinic Note   Patient ID: Zachary Arias, male   DOB: Aug 15, 1961, 61 y.o.   MRN: [id number] PCP: [name] HF Cardiologist: Dr. Gala Romney   HPI: Zachary Arias is a 61 y.o. 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 Cath with 1-v CAD with chronically occluded RCA. MRI in January 2011 with EF 31% with inferior scar.   Brother had heart transplant in [place]. Dad also had previous heart transplant.   He was lost to f/u since 2015. Admitted 03/05/20 with cardiogenic shock after stopping meds for months and using supplements. SBP in 70-80s. ECHO EF 20-25% RV normal. Severe MR. Having frequent PVCs. Placed on amiodarone to suppress PVCs  [facility] 03/10/20 showed stable CAD (chronically occluded distal RCA and moderate non-obstructive CAD elsewhere), low filling pressures and normal cardiac output on milrinone 0.25. Was able to wean off milrinone. Started back on GDMT.  Repeat echo 01/05/21 EF 40-45% mild to mod MR RV mild HK   Lost to f/u.  Follow up 3/23, off all meds except ASA x 3 months, REDs 34%, Farxiga and losartan restarted, with close follow to add back GDMT. Zio 2 week placed to quantify PVCs, showed frequent NSVT, frequent PVCs (13.1%). Started on amio 200 mg bid and referred to EP.   Admitted 4/23 with a/c CHF, had been off all meds x 1 month. Started on IV lasix. BP dropped and developed AK. Empiric DBA started. Echo showed EF 30-35%.  Failed DBA wean. GDMT titrated. Arranged for home DBA via PICC with Hospice (only option as he is not a citizen and uninsured). He had also refused additional advanced thearpies, including VAD, and there were also concerns regarding lack of social supported.   Readmitted 7/23 w/ a/c CHF with progressive SOB. Expressed desire to reconsider advanced therapies, given he had obtained insurance. However, after careful consideration and evaluation by both social work and VAD  team, he was not felt to be candidate for LVAD nor transplant given lack of social support and prior h/o poor compliance. Diuresed with Lasix and home DBA continued. Echo showed EF 25-30%, w/ severe MR/TR. RV systolic function moderately and HK. RHC/LHC on DBA 5 showed  stable 1V CAD, moderate mixed pulmonary HTN, moderately reduced CO, prominent v waves in PCWP tracing suggestive of significant MR. TEE showed  LVEF 25% severe central MR. RV moderately down. Structural heart team consulted and recommended MitraClip. Optimized pre-procedure with IABP. PA pressures elevated on DBA, was switched to Milrinone.0.375 mcg.  S/p TEER with MitraClip G4 XTW devices, 1st to the lateral side of A2/P2 and 2nd to the medial side of A2/P2 w/ excellent result w/ reduction from 4+ -> 2+ MR. Weaned off IABP and off milrinone w/ stable hemodynamics. GDMT titrated, discharged home, weight 135 lbs.  Echo 9/23 EF 20-25% MV with 2 clips. Grad 5.5 mild-mod MR  Today he returns for HF follow up. Feels great. Says he feels like he doesn't even have a heart problem. Denies CP, SOB, orthopnea or PND. Compliant with meds.    Cardiac Studies: - Echo (4/11): EF 30-35%. - Echo (8/13): EF 45-50% with inferior and inferoseptal hypokinesis - Echo (10/21): EF 30-35% - Echo (4/22): EF 40-45% mild to mod MR RV mild HK  - Zio 2 week (3/23): 13.1% PVCs - Echo (5/23):EF 30-35% - Echo (7/23): EF 25-30%, severe  MR/TR, RV moderately reduced and HK - R/LHC (7/23): stable 1V CAD, moderate mixed pulmonary HTN, moderately reduced CO on DBA 5, prominent v waves in PCWP tracing suggestive of significant MR - TEE  (7/23): EF 25%, severe central MR, RV moderately reduced - Echo (7/23 post MitraClip) EF 25%, mild-mod residual MR   SH: Lives in [place], nonsmoker. He has been disabled since May. He needs assistance with transportation. Lives alone. He uses personal shoppers groceries.   FH: No premature CAD  ROS: All systems negative except as  listed in HPI, PMH and Problem List.  Past Medical History:  Diagnosis Date   Congestive heart failure (CHF) (HCC)    secondary to ischemic cardiomyopathy with an ejection fraction of 25-30%   Coronary artery disease    History of hypertension    Inguinal hernia 09/28/2003   History of left inguinal hernia, status post repair in 2005   S/P mitral valve clip implantation 04/15/2022   Mitraclip XTW x2 with Dr. Excell Seltzer and Dr. Lynnette Caffey   Tachycardia 09/27/2008    Unexplained tachycardia during hospitalization   Current Outpatient Medications  Medication Sig Dispense Refill   amiodarone (PACERONE) 200 MG tablet Take 1 tablet (200 mg total) by mouth daily. 30 tablet 11   aspirin EC (ASPIRIN 81) 81 MG tablet Take 1 tablet (81 mg total) by mouth daily. 30 tablet 6   atorvastatin (LIPITOR) 40 MG tablet Take 1 tablet (40 mg total) by mouth daily. 30 tablet 3   carvedilol (COREG) 3.125 MG tablet Take 1 tablet (3.125 mg total) by mouth 2 (two) times daily. 180 tablet 3   dapagliflozin propanediol (FARXIGA) 10 MG TABS tablet Take 1 tablet (10 mg total) by mouth daily for the heart. 30 tablet 2   LORazepam (ATIVAN) 0.5 MG tablet Take 0.5 mg by mouth every 4 (four) hours as needed for anxiety.     losartan (COZAAR) 25 MG tablet Take 1 tablet (25 mg total) by mouth at bedtime. 90 tablet 3   Multiple Vitamin (MULTIVITAMIN) capsule Take 1 capsule by mouth daily.     spironolactone (ALDACTONE) 25 MG tablet Take 1 tablet by mouth daily. 30 tablet 6   torsemide (DEMADEX) 20 MG tablet Take 1 tablet (20 mg total) by mouth daily. 90 tablet 3   No current facility-administered medications for this encounter.   BP 102/68   Pulse 68   Wt 69.7 kg (153 lb 9.6 oz)   SpO2 100%   BMI 23.35 kg/m   Wt Readings from Last 3 Encounters:  09/17/22 69.7 kg (153 lb 9.6 oz)  06/14/22 66.9 kg (147 lb 6.4 oz)  06/09/22 65.3 kg (144 lb)   PHYSICAL EXAM: General:  NAD. No resp difficulty HEENT: Normal Neck: Supple.  No JVD. Carotids 2+ bilat; no bruits. No lymphadenopathy or thryomegaly appreciated. Cor: PMI nondisplaced. Regular rate & rhythm. No rubs, gallops, 2/6 MR Lungs: Clear Abdomen: Soft, nontender, nondistended. No hepatosplenomegaly. No bruits or masses. Good bowel sounds. Extremities: No cyanosis, clubbing, rash, edema Neuro: Alert & oriented x 3, cranial nerves grossly intact. Moves all 4 extremities w/o difficulty. Affect pleasant.  ASSESSMENT & PLAN: 1.  Chronic Systolic Heart Failure - Cath 1-v CAD chronically occluded RCA. MRI in January 2011 EF 31% with inferior scar.  - Suspect mixed ischemic/nonischmic cardiomyopathy with genetic component.   - Echo (8/13): EF 45-50% .  - No f/u between to 2015-2021 - Echo (6/21): EF 20-25% severe central MR. Repeat cath 6/21 with stable CAD -  Echo (4/22): EF 40-45% mild to mod MR RV mild HK  - Echo (4/23): EF 30-35%.   - Admit 4/23 with shock, started on DBA and lasix. Diuresed 20 lbs. Unable to wean DBA discharged on home DBA 5 mcg/kg/min with hospice care - Echo (04/10/22): EF 25-30% Severe MR/TR. Moderate RV HK.  - RHC/LHC with stable 1V disease, moderate mixed pulmonary HTN, moderately reduced CO on DBA 5, and prominent v waves in PCWP tracing suggestive of significant MR.  - s/p mTEER 7/23 - Echo 9/23 EF 20-25% MV with 2 clips. Grad 5.5 mild-mod MR - NYHA I-II. Volume status ok  - Continue losartan 25 mg qhs. - Continue Farxiga 10 mg daily  - Continue spironolactone 25 mg daily  - Continue carvedilol 3.125 bid - Continue torsemide 20 mg daily. - Off digoxin with elevated level. - BP too soft to titrate GDMT - Eventually will likely need advanced therapies - Labs today.  2. Mitral Regurgitation  - S/P mTEER 04/15/22 w/ reduction from  MR 4+ -> 2+  - Echo 9/23 EF 20-25% MV with 2 clips. Grad 5.5 mild-mod MR  3. CAD  - Cath 7/23 w/ stable with 1-v CAD with chronically occluded RCA - No s/s angina - Continue ASA and atorvastatin.   4.  PVCs - asymptomatic. - Continue amio 200 mg daily.  - Saw EP ([place] 9/23) -> continue amio    5. CKD Stage IIIa  - Baseline SCr 1.4 - On Farxiga.  - Labs today    Arvilla Meres, MD  2:01 PM

## 2022-09-17 NOTE — Telephone Encounter (Signed)
H&V Care Navigation CSW Progress Note  Clinical Social Worker received call from pt to request help with ride to clinic.  States that he tried to set up through [place] but was told no longer with them.  See that pt has Medicare now so explained that he would no longer have [facility] Medicaid but traditional Medicaid so future rides should be set through his Medicare plan or through [facility] for Medicaid.  CSW arranged uber ride through [place] to get to todays appt.   SDOH Screenings   Food Insecurity: Food Insecurity Present (01/21/2022)  Housing: Low Risk  (01/21/2022)  Transportation Needs: Unmet Transportation Needs (09/17/2022)  Financial Resource Strain: High Risk (01/21/2022)  Tobacco Use: Low Risk  (06/14/2022)     Burna Sis, LCSW Clinical Social Worker Advanced [facility] Clinic Desk#: [phone] Cell#: [phone]

## 2022-09-17 NOTE — Addendum Note (Signed)
Encounter addended by: Noralee Space, RN on: 09/17/2022 2:18 PM  Actions taken: Order list changed, Diagnosis association updated, Clinical Note Signed, Charge Capture section accepted

## 2022-09-18 LAB — T3, FREE: T3, Free: 2.7 pg/mL (ref 2.0–4.4)

## 2022-09-30 ENCOUNTER — Other Ambulatory Visit (HOSPITAL_COMMUNITY): Payer: Self-pay | Admitting: Family Medicine

## 2022-10-14 ENCOUNTER — Other Ambulatory Visit (HOSPITAL_COMMUNITY): Payer: Self-pay | Admitting: Family Medicine

## 2022-11-17 ENCOUNTER — Other Ambulatory Visit (HOSPITAL_COMMUNITY): Payer: Self-pay | Admitting: Family Medicine

## 2022-11-17 ENCOUNTER — Other Ambulatory Visit: Payer: Self-pay | Admitting: Cardiology

## 2022-11-17 DIAGNOSIS — I5022 Chronic systolic (congestive) heart failure: Secondary | ICD-10-CM

## 2022-11-17 DIAGNOSIS — N183 Chronic kidney disease, stage 3 unspecified: Secondary | ICD-10-CM

## 2022-11-19 ENCOUNTER — Other Ambulatory Visit (HOSPITAL_COMMUNITY): Payer: Self-pay

## 2022-11-19 ENCOUNTER — Encounter (HOSPITAL_COMMUNITY): Payer: Self-pay | Admitting: Internal Medicine

## 2022-11-19 MED ORDER — SPIRONOLACTONE 25 MG PO TABS: 25.0000 mg | ORAL_TABLET | Freq: Every day | ORAL | 6 refills | Status: DC

## 2022-12-08 ENCOUNTER — Other Ambulatory Visit (HOSPITAL_COMMUNITY): Payer: Self-pay | Admitting: Internal Medicine

## 2022-12-15 ENCOUNTER — Telehealth (HOSPITAL_COMMUNITY): Payer: Self-pay

## 2022-12-15 NOTE — Telephone Encounter (Signed)
Forms awaiting MD signature

## 2022-12-20 NOTE — Telephone Encounter (Signed)
Forms faxed on 12/17/22, My chart message sent to patient to inform him

## 2023-01-17 NOTE — Progress Notes (Signed)
Advanced Heart Failure Clinic Note   Patient ID: Zachary Arias, male   DOB: Aug 15, 1961, 62 y.o.   MRN: [id number] PCP: [name] HF Cardiologist: Dr. Gala Romney   HPI: Zachary Arias is a 62 y.o. 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 Cath with 1-v CAD with chronically occluded RCA. MRI in January 2011 with EF 31% with inferior scar.   Brother had heart transplant in [place]. Dad also had previous heart transplant.   He was lost to f/u since 2015. Admitted 03/05/20 with cardiogenic shock after stopping meds for months and using supplements. SBP in 70-80s. ECHO EF 20-25% RV normal. Severe MR. Having frequent PVCs. Placed on amiodarone to suppress PVCs  [facility] 03/10/20 showed stable CAD (chronically occluded distal RCA and moderate non-obstructive CAD elsewhere), low filling pressures and normal cardiac output on milrinone 0.25. Was able to wean off milrinone. Started back on GDMT.  Repeat echo 01/05/21 EF 40-45% mild to mod MR RV mild HK   Lost to f/u.  Follow up 3/23, off all meds except ASA x 3 months, REDs 34%, Farxiga and losartan restarted, with close follow to add back GDMT. Zio 2 week placed to quantify PVCs, showed frequent NSVT, frequent PVCs (13.1%). Started on amio 200 mg bid and referred to EP.   Admitted 4/23 with a/c CHF, had been off all meds x 1 month. Started on IV lasix. BP dropped and developed AK. Empiric DBA started. Echo showed EF 30-35%.  Failed DBA wean. GDMT titrated. Arranged for home DBA via PICC with Hospice (only option as he is not a citizen and uninsured). He had also refused additional advanced thearpies, including VAD, and there were also concerns regarding lack of social supported.   Readmitted 7/23 w/ a/c CHF with progressive SOB. Expressed desire to reconsider advanced therapies, given he had obtained insurance. However, after careful consideration and evaluation by both social work and VAD  team, he was not felt to be candidate for LVAD nor transplant given lack of social support and prior h/o poor compliance. Diuresed with Lasix and home DBA continued. Echo showed EF 25-30%, w/ severe MR/TR. RV systolic function moderately and HK. RHC/LHC on DBA 5 showed  stable 1V CAD, moderate mixed pulmonary HTN, moderately reduced CO, prominent v waves in PCWP tracing suggestive of significant MR. TEE showed  LVEF 25% severe central MR. RV moderately down. Structural heart team consulted and recommended MitraClip. Optimized pre-procedure with IABP. PA pressures elevated on DBA, was switched to Milrinone.0.375 mcg.  S/p TEER with MitraClip G4 XTW devices, 1st to the lateral side of A2/P2 and 2nd to the medial side of A2/P2 w/ excellent result w/ reduction from 4+ -> 2+ MR. Weaned off IABP and off milrinone w/ stable hemodynamics. GDMT titrated, discharged home, weight 135 lbs.  Echo 9/23 EF 20-25% MV with 2 clips. Grad 5.5 mild-mod MR  Today he returns for HF follow up. Feels great. Says he feels like he doesn't even have a heart problem. Denies CP, SOB, orthopnea or PND. Compliant with meds.    Cardiac Studies: - Echo (4/11): EF 30-35%. - Echo (8/13): EF 45-50% with inferior and inferoseptal hypokinesis - Echo (10/21): EF 30-35% - Echo (4/22): EF 40-45% mild to mod MR RV mild HK  - Zio 2 week (3/23): 13.1% PVCs - Echo (5/23):EF 30-35% - Echo (7/23): EF 25-30%, severe  MR/TR, RV moderately reduced and HK - R/LHC (7/23): stable 1V CAD, moderate mixed pulmonary HTN, moderately reduced CO on DBA 5, prominent v waves in PCWP tracing suggestive of significant MR - TEE  (7/23): EF 25%, severe central MR, RV moderately reduced - Echo (7/23 post MitraClip) EF 25%, mild-mod residual MR   SH: Lives in [place], nonsmoker. He has been disabled since May. He needs assistance with transportation. Lives alone. He uses [employer].   FH: No premature CAD  ROS: All systems negative except as  listed in HPI, PMH and Problem List.  Past Medical History:  Diagnosis Date   Congestive heart failure (CHF) (HCC)    secondary to ischemic cardiomyopathy with an ejection fraction of 25-30%   Coronary artery disease    History of hypertension    Inguinal hernia 09/28/2003   History of left inguinal hernia, status post repair in 2005   S/P mitral valve clip implantation 04/15/2022   Mitraclip XTW x2 with Dr. Excell Seltzer and Dr. Lynnette Caffey   Tachycardia 09/27/2008    Unexplained tachycardia during hospitalization   Current Outpatient Medications  Medication Sig Dispense Refill   amiodarone (PACERONE) 200 MG tablet Take 1 tablet (200 mg total) by mouth daily. 30 tablet 11   aspirin EC (ASPIRIN 81) 81 MG tablet Take 1 tablet (81 mg total) by mouth daily. 30 tablet 6   atorvastatin (LIPITOR) 40 MG tablet TAKE 1 TABLET BY MOUTH DAILY 90 tablet 3   carvedilol (COREG) 3.125 MG tablet TAKE 1 TABLET BY MOUTH TWICE  DAILY 200 tablet 2   FARXIGA 10 MG TABS tablet TAKE 1 TABLET BY MOUTH DAILY FOR THE HEART 60 tablet 5   LORazepam (ATIVAN) 0.5 MG tablet Take 0.5 mg by mouth every 4 (four) hours as needed for anxiety.     losartan (COZAAR) 25 MG tablet TAKE 1 TABLET BY MOUTH AT  BEDTIME 100 tablet 2   Multiple Vitamin (MULTIVITAMIN) capsule Take 1 capsule by mouth daily.     spironolactone (ALDACTONE) 25 MG tablet Take 1 tablet by mouth daily. 30 tablet 6   torsemide (DEMADEX) 20 MG tablet TAKE 1 TABLET BY MOUTH DAILY 100 tablet 1   No current facility-administered medications for this visit.   There were no vitals taken for this visit.  Wt Readings from Last 3 Encounters:  09/17/22 69.7 kg (153 lb 9.6 oz)  06/14/22 66.9 kg (147 lb 6.4 oz)  06/09/22 65.3 kg (144 lb)   PHYSICAL EXAM: General:  NAD. No resp difficulty HEENT: Normal Neck: Supple. No JVD. Carotids 2+ bilat; no bruits. No lymphadenopathy or thryomegaly appreciated. Cor: PMI nondisplaced. Regular rate & rhythm. No rubs, gallops, 2/6  MR Lungs: Clear Abdomen: Soft, nontender, nondistended. No hepatosplenomegaly. No bruits or masses. Good bowel sounds. Extremities: No cyanosis, clubbing, rash, edema Neuro: Alert & oriented x 3, cranial nerves grossly intact. Moves all 4 extremities w/o difficulty. Affect pleasant.  ASSESSMENT & PLAN: 1.  Chronic Systolic Heart Failure - Cath 1-v CAD chronically occluded RCA. MRI in January 2011 EF 31% with inferior scar.  - Suspect mixed ischemic/nonischmic cardiomyopathy with genetic component.   - Echo (8/13): EF 45-50% .  - No f/u between to 2015-2021 - Echo (6/21): EF 20-25% severe central MR. Repeat cath 6/21 with stable CAD - Echo (4/22): EF 40-45% mild to mod MR RV mild HK  - Echo (4/23): EF 30-35%.   - Admit 4/23 with shock, started on DBA and lasix. Diuresed 20 lbs. Unable  to wean DBA discharged on home DBA 5 mcg/kg/min with hospice care - Echo (04/10/22): EF 25-30% Severe MR/TR. Moderate RV HK.  - RHC/LHC with stable 1V disease, moderate mixed pulmonary HTN, moderately reduced CO on DBA 5, and prominent v waves in PCWP tracing suggestive of significant MR.  - s/p mTEER 7/23 - Echo 9/23 EF 20-25% MV with 2 clips. Grad 5.5 mild-mod MR - NYHA I-II. Volume status ok  - Continue losartan 25 mg qhs. - Continue Farxiga 10 mg daily  - Continue spironolactone 25 mg daily  - Continue carvedilol 3.125 bid - Continue torsemide 20 mg daily. - Off digoxin with elevated level. - BP too soft to titrate GDMT - Eventually will likely need advanced therapies - Labs today.  2. Mitral Regurgitation  - S/P mTEER 04/15/22 w/ reduction from  MR 4+ -> 2+  - Echo 9/23 EF 20-25% MV with 2 clips. Grad 5.5 mild-mod MR  3. CAD  - Cath 7/23 w/ stable with 1-v CAD with chronically occluded RCA - No s/s angina - Continue ASA and atorvastatin.   4. PVCs - asymptomatic. - Continue amio 200 mg daily.  - Saw EP ([place] 9/23) -> continue amio    5. CKD Stage IIIa  - Baseline SCr 1.4 - On Farxiga.   - Labs today    Jacklynn Ganong, FNP  12:13 PM

## 2023-01-19 ENCOUNTER — Encounter (HOSPITAL_COMMUNITY): Payer: Self-pay

## 2023-01-19 ENCOUNTER — Ambulatory Visit (HOSPITAL_COMMUNITY)
Admission: RE | Admit: 2023-01-19 | Discharge: 2023-01-19 | Disposition: A | Discharge status: Home / Self Care | Payer: 59 | Source: Ambulatory Visit | Attending: Family Medicine | Admitting: Family Medicine

## 2023-01-19 VITALS — BP 98/62 | HR 65 | Wt 170.2 lb

## 2023-01-19 DIAGNOSIS — L905 Scar conditions and fibrosis of skin: Secondary | ICD-10-CM | POA: Insufficient documentation

## 2023-01-19 DIAGNOSIS — N1831 Chronic kidney disease, stage 3a: Secondary | ICD-10-CM | POA: Insufficient documentation

## 2023-01-19 DIAGNOSIS — I5022 Chronic systolic (congestive) heart failure: Secondary | ICD-10-CM | POA: Insufficient documentation

## 2023-01-19 DIAGNOSIS — Z9889 Other specified postprocedural states: Secondary | ICD-10-CM | POA: Diagnosis not present

## 2023-01-19 DIAGNOSIS — Z79899 Other long term (current) drug therapy: Secondary | ICD-10-CM | POA: Insufficient documentation

## 2023-01-19 DIAGNOSIS — I272 Pulmonary hypertension, unspecified: Secondary | ICD-10-CM | POA: Insufficient documentation

## 2023-01-19 DIAGNOSIS — I255 Ischemic cardiomyopathy: Secondary | ICD-10-CM | POA: Insufficient documentation

## 2023-01-19 DIAGNOSIS — Z7984 Long term (current) use of oral hypoglycemic drugs: Secondary | ICD-10-CM | POA: Insufficient documentation

## 2023-01-19 DIAGNOSIS — I493 Ventricular premature depolarization: Secondary | ICD-10-CM | POA: Diagnosis not present

## 2023-01-19 DIAGNOSIS — I130 Hypertensive heart and chronic kidney disease with heart failure and stage 1 through stage 4 chronic kidney disease, or unspecified chronic kidney disease: Secondary | ICD-10-CM | POA: Diagnosis not present

## 2023-01-19 DIAGNOSIS — Z95818 Presence of other cardiac implants and grafts: Secondary | ICD-10-CM

## 2023-01-19 DIAGNOSIS — I2510 Atherosclerotic heart disease of native coronary artery without angina pectoris: Secondary | ICD-10-CM | POA: Diagnosis not present

## 2023-01-19 DIAGNOSIS — I340 Nonrheumatic mitral (valve) insufficiency: Secondary | ICD-10-CM | POA: Insufficient documentation

## 2023-01-19 LAB — BASIC METABOLIC PANEL
Anion gap: 7 (ref 5–15)
BUN: 19 mg/dL (ref 6–20)
CO2: 29 mmol/L (ref 22–32)
Calcium: 9.4 mg/dL (ref 8.9–10.3)
Chloride: 102 mmol/L (ref 98–111)
Creatinine, Ser: 1.86 mg/dL — ABNORMAL HIGH (ref 0.61–1.24)
GFR, Estimated: 41 mL/min — ABNORMAL LOW (ref >60)
Glucose, Bld: 136 mg/dL — ABNORMAL HIGH (ref 70–99)
Potassium: 3.9 mmol/L (ref 3.5–5.1)
Sodium: 138 mmol/L (ref 135–145)

## 2023-01-19 LAB — BRAIN NATRIURETIC PEPTIDE: B Natriuretic Peptide: 112.9 pg/mL — ABNORMAL HIGH (ref 0.0–100.0)

## 2023-01-19 NOTE — Patient Instructions (Signed)
It was great to see you today! No medication changes are needed at this time.  Labs today We will only contact you if something comes back abnormal or we need to make some changes. Otherwise no news is good news!  You have been referred to Advanced HF Clinic at [facility] -they will be in contact with an appointment   Do the following things EVERYDAY: Weigh yourself in the morning before breakfast. Write it down and keep it in a log. Take your medicines as prescribed Eat low salt foods--Limit salt (sodium) to 2000 mg per day.  Stay as active as you can everyday Limit all fluids for the day to less than 2 liters  At the Advanced [facility] Failure Clinic, you and your health needs are our priority. As part of our continuing mission to provide you with exceptional heart care, we have created designated Provider Care Teams. These Care Teams include your primary Cardiologist (physician) and Advanced Practice Providers (APPs- Physician Assistants and Nurse Practitioners) who all work together to provide you with the care you need, when you need it.   You may see any of the following providers on your designated Care Team at your next follow up: Dr Arvilla Meres Dr Marca Ancona Dr. Marcos Eke, NP Robbie Lis, [place] [facility] [place], [place] Brynda Peon, NP [name], PharmD   Please be sure to bring in all your medications bottles to every appointment.    Thank you for choosing [facility]

## 2023-01-20 ENCOUNTER — Telehealth (HOSPITAL_COMMUNITY): Payer: Self-pay

## 2023-01-20 NOTE — Telephone Encounter (Addendum)
Pt aware, agreeable, and verbalized understanding   Lab order mailed to patient  ----- Message from Jacklynn Ganong, FNP sent at 01/19/2023  4:28 PM EDT ----- Labs ok, SCr mildly elevated from baseline.  Repeat BMET in 2 weeks (he lives in [place], [place] to get at [facility] there)

## 2023-01-24 ENCOUNTER — Telehealth (HOSPITAL_COMMUNITY): Payer: Self-pay | Admitting: Cardiology

## 2023-01-24 NOTE — Telephone Encounter (Addendum)
NP referral faxed to [facility] [phone]) Attn AHF Clinic/Cardiology  Records include OV, demographics, echo, and cath Fax confirmation received

## 2023-02-25 ENCOUNTER — Other Ambulatory Visit (HOSPITAL_COMMUNITY): Payer: Self-pay | Admitting: Internal Medicine

## 2023-04-27 ENCOUNTER — Telehealth (HOSPITAL_COMMUNITY): Payer: Self-pay

## 2023-04-27 NOTE — Telephone Encounter (Signed)
Disability paperwork was faxed to [employer] on 04/26/23 to [phone]. Forms will be scanned into patients chart

## 2023-04-28 ENCOUNTER — Other Ambulatory Visit (HOSPITAL_COMMUNITY): Payer: Self-pay | Admitting: Internal Medicine

## 2023-04-29 ENCOUNTER — Encounter (HOSPITAL_COMMUNITY): Payer: Self-pay | Admitting: Internal Medicine

## 2023-05-20 ENCOUNTER — Telehealth (HOSPITAL_COMMUNITY): Payer: Self-pay | Admitting: Licensed Clinical Social Worker

## 2023-05-20 NOTE — Telephone Encounter (Signed)
CSW received VM from [employer] in regards to patient stating that they did not receive paperwork from [place] after sending requests and that they were suspending benefits.  CSW completed chart review and see that we sent requested paperwork on 7/30.  CSW called case worker back Wynona Canes [phone] ext [phone]) and informed of above.  She reports that she did get documents that date but only through April.  CSW confirmed that we have not seen patient in clinic since that April date so we have no additional documents to provide.  Burna Sis, LCSW Clinical Social Worker Advanced [facility] Failure Clinic Desk#: [phone] Cell#: [phone]

## 2023-07-02 ENCOUNTER — Other Ambulatory Visit: Payer: Self-pay | Admitting: Cardiology

## 2023-07-02 ENCOUNTER — Other Ambulatory Visit (HOSPITAL_COMMUNITY): Payer: Self-pay | Admitting: Family Medicine

## 2023-07-02 DIAGNOSIS — N183 Chronic kidney disease, stage 3 unspecified: Secondary | ICD-10-CM

## 2023-07-02 DIAGNOSIS — I5022 Chronic systolic (congestive) heart failure: Secondary | ICD-10-CM

## 2023-07-05 ENCOUNTER — Telehealth: Payer: Self-pay

## 2023-07-05 NOTE — Telephone Encounter (Signed)
Attempted to contact patient over the last several months to arrange 1 year MitraClip echo/office visit. His number on file is out of order and it appears as though he is now being followed by [facility].  Unfortunately, he had an office visit and echo at [facility] (available in Care Everywhere) but the echo was 01/31/2023 which is out of the window for MitraClip.  There is a phone note in Care Everywhere as recent as 06/17/2023. Hopefully the patient is doing well.

## 2023-08-11 ENCOUNTER — Other Ambulatory Visit (HOSPITAL_COMMUNITY): Payer: Self-pay | Admitting: Family Medicine

## 2023-10-03 ENCOUNTER — Other Ambulatory Visit (HOSPITAL_COMMUNITY): Payer: Self-pay | Admitting: Internal Medicine

## 2023-10-03 ENCOUNTER — Other Ambulatory Visit (HOSPITAL_COMMUNITY): Payer: Self-pay | Admitting: Family Medicine

## 2023-10-24 ENCOUNTER — Other Ambulatory Visit (HOSPITAL_COMMUNITY): Payer: Self-pay | Admitting: Internal Medicine

## 2023-10-31 ENCOUNTER — Other Ambulatory Visit (HOSPITAL_COMMUNITY): Payer: Self-pay | Admitting: Internal Medicine

## 2024-02-26 ENCOUNTER — Other Ambulatory Visit (HOSPITAL_COMMUNITY): Payer: Self-pay | Admitting: Family Medicine

## 2024-04-12 ENCOUNTER — Other Ambulatory Visit: Payer: Self-pay | Admitting: Cardiology

## 2024-04-12 DIAGNOSIS — I5022 Chronic systolic (congestive) heart failure: Secondary | ICD-10-CM

## 2024-04-12 DIAGNOSIS — N183 Chronic kidney disease, stage 3 unspecified: Secondary | ICD-10-CM

## 2024-04-13 NOTE — Telephone Encounter (Signed)
 This is a CHF pt

## 2024-05-08 ENCOUNTER — Other Ambulatory Visit (HOSPITAL_COMMUNITY): Payer: Self-pay | Admitting: Family Medicine

## 2024-06-17 ENCOUNTER — Other Ambulatory Visit (HOSPITAL_COMMUNITY): Payer: Self-pay | Admitting: Internal Medicine

## 2024-07-01 ENCOUNTER — Other Ambulatory Visit (HOSPITAL_COMMUNITY): Payer: Self-pay | Admitting: Internal Medicine

## 2024-07-05 ENCOUNTER — Other Ambulatory Visit (HOSPITAL_COMMUNITY): Payer: Self-pay | Admitting: Internal Medicine

## 2024-07-11 ENCOUNTER — Other Ambulatory Visit (HOSPITAL_COMMUNITY): Payer: Self-pay | Admitting: Internal Medicine

## 2024-08-02 ENCOUNTER — Other Ambulatory Visit (HOSPITAL_COMMUNITY): Payer: Self-pay | Admitting: Internal Medicine

## 2024-09-17 ENCOUNTER — Other Ambulatory Visit (HOSPITAL_COMMUNITY): Payer: Self-pay | Admitting: Internal Medicine
